# Patient Record
Sex: Female | Born: 1945 | Race: Black or African American | Hispanic: No | Marital: Married | Smoking: Current some day smoker
Health system: Southern US, Community
[De-identification: ages and names within clinical notes are randomized; demographics above are authoritative.]

## PROBLEM LIST (undated history)

## (undated) DIAGNOSIS — I5032 Chronic diastolic (congestive) heart failure: Secondary | ICD-10-CM

## (undated) DIAGNOSIS — Z8719 Personal history of other diseases of the digestive system: Secondary | ICD-10-CM

## (undated) DIAGNOSIS — I4891 Unspecified atrial fibrillation: Secondary | ICD-10-CM

## (undated) DIAGNOSIS — Z862 Personal history of diseases of the blood and blood-forming organs and certain disorders involving the immune mechanism: Secondary | ICD-10-CM

## (undated) DIAGNOSIS — K219 Gastro-esophageal reflux disease without esophagitis: Secondary | ICD-10-CM

## (undated) DIAGNOSIS — N183 Chronic kidney disease, stage 3 (moderate): Secondary | ICD-10-CM

## (undated) DIAGNOSIS — J45909 Unspecified asthma, uncomplicated: Secondary | ICD-10-CM

## (undated) DIAGNOSIS — J189 Pneumonia, unspecified organism: Secondary | ICD-10-CM

## (undated) DIAGNOSIS — R9439 Abnormal result of other cardiovascular function study: Secondary | ICD-10-CM

## (undated) DIAGNOSIS — I2510 Atherosclerotic heart disease of native coronary artery without angina pectoris: Secondary | ICD-10-CM

## (undated) DIAGNOSIS — E785 Hyperlipidemia, unspecified: Secondary | ICD-10-CM

## (undated) DIAGNOSIS — D62 Acute posthemorrhagic anemia: Secondary | ICD-10-CM

## (undated) DIAGNOSIS — J9601 Acute respiratory failure with hypoxia: Secondary | ICD-10-CM

## (undated) DIAGNOSIS — Z8709 Personal history of other diseases of the respiratory system: Secondary | ICD-10-CM

## (undated) DIAGNOSIS — I10 Essential (primary) hypertension: Secondary | ICD-10-CM

## (undated) DIAGNOSIS — I712 Thoracic aortic aneurysm, without rupture: Secondary | ICD-10-CM

## (undated) DIAGNOSIS — M109 Gout, unspecified: Secondary | ICD-10-CM

## (undated) DIAGNOSIS — M2550 Pain in unspecified joint: Secondary | ICD-10-CM

## (undated) DIAGNOSIS — R42 Dizziness and giddiness: Secondary | ICD-10-CM

## (undated) DIAGNOSIS — I700 Atherosclerosis of aorta: Secondary | ICD-10-CM

## (undated) HISTORY — DX: Atherosclerosis of aorta: I70.0

## (undated) HISTORY — DX: Dizziness and giddiness: R42

## (undated) HISTORY — DX: Personal history of other diseases of the respiratory system: Z87.09

## (undated) HISTORY — DX: Thoracic aortic aneurysm, without rupture: I71.2

## (undated) HISTORY — DX: Pneumonia, unspecified organism: J18.9

## (undated) HISTORY — DX: Personal history of other diseases of the digestive system: Z87.19

## (undated) HISTORY — DX: Pain in unspecified joint: M25.50

## (undated) HISTORY — DX: Hyperlipidemia, unspecified: E78.5

## (undated) HISTORY — DX: Gastro-esophageal reflux disease without esophagitis: K21.9

## (undated) HISTORY — DX: Atherosclerotic heart disease of native coronary artery without angina pectoris: I25.10

## (undated) HISTORY — DX: Unspecified atrial fibrillation: I48.91

## (undated) HISTORY — DX: Acute respiratory failure with hypoxia: J96.01

## (undated) HISTORY — DX: Essential (primary) hypertension: I10

## (undated) HISTORY — DX: Chronic diastolic (congestive) heart failure: I50.32

## (undated) HISTORY — DX: Personal history of diseases of the blood and blood-forming organs and certain disorders involving the immune mechanism: Z86.2

---
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
## 1979-09-02 HISTORY — PX: ABDOMINAL HYSTERECTOMY: SHX81

## 1999-03-11 ENCOUNTER — Encounter: Admission: RE | Admit: 1999-03-11 | Discharge: 1999-03-11 | Payer: Self-pay | Admitting: Family Medicine

## 1999-12-17 ENCOUNTER — Emergency Department (HOSPITAL_COMMUNITY): Admission: EM | Admit: 1999-12-17 | Discharge: 1999-12-17 | Payer: Self-pay

## 2000-05-29 ENCOUNTER — Encounter: Admission: RE | Admit: 2000-05-29 | Discharge: 2000-05-29 | Payer: Self-pay | Admitting: Family Medicine

## 2000-07-27 ENCOUNTER — Emergency Department (HOSPITAL_COMMUNITY): Admission: EM | Admit: 2000-07-27 | Discharge: 2000-07-27 | Payer: Self-pay | Admitting: Emergency Medicine

## 2000-08-26 ENCOUNTER — Emergency Department (HOSPITAL_COMMUNITY): Admission: EM | Admit: 2000-08-26 | Discharge: 2000-08-26 | Payer: Self-pay | Admitting: Emergency Medicine

## 2001-05-09 ENCOUNTER — Inpatient Hospital Stay (HOSPITAL_COMMUNITY): Admission: EM | Admit: 2001-05-09 | Discharge: 2001-05-12 | Payer: Self-pay | Admitting: Gastroenterology

## 2001-12-13 ENCOUNTER — Emergency Department (HOSPITAL_COMMUNITY): Admission: EM | Admit: 2001-12-13 | Discharge: 2001-12-13 | Payer: Self-pay

## 2002-02-14 ENCOUNTER — Encounter: Payer: Self-pay | Admitting: Emergency Medicine

## 2002-02-14 ENCOUNTER — Emergency Department (HOSPITAL_COMMUNITY): Admission: EM | Admit: 2002-02-14 | Discharge: 2002-02-14 | Payer: Self-pay | Admitting: Emergency Medicine

## 2003-04-10 ENCOUNTER — Emergency Department (HOSPITAL_COMMUNITY): Admission: EM | Admit: 2003-04-10 | Discharge: 2003-04-10 | Payer: Self-pay

## 2005-01-10 ENCOUNTER — Emergency Department (HOSPITAL_COMMUNITY): Admission: EM | Admit: 2005-01-10 | Discharge: 2005-01-10 | Payer: Self-pay | Admitting: Emergency Medicine

## 2006-09-13 ENCOUNTER — Inpatient Hospital Stay (HOSPITAL_COMMUNITY): Admission: EM | Admit: 2006-09-13 | Discharge: 2006-09-16 | Payer: Self-pay | Admitting: Emergency Medicine

## 2006-09-15 ENCOUNTER — Encounter (INDEPENDENT_AMBULATORY_CARE_PROVIDER_SITE_OTHER): Payer: Self-pay | Admitting: Specialist

## 2006-09-15 ENCOUNTER — Encounter: Payer: Self-pay | Admitting: Family Medicine

## 2006-09-17 ENCOUNTER — Ambulatory Visit: Payer: Self-pay | Admitting: Internal Medicine

## 2007-10-03 ENCOUNTER — Inpatient Hospital Stay (HOSPITAL_COMMUNITY): Admission: EM | Admit: 2007-10-03 | Discharge: 2007-10-06 | Payer: Self-pay | Admitting: Emergency Medicine

## 2007-10-14 ENCOUNTER — Ambulatory Visit: Payer: Self-pay | Admitting: Family Medicine

## 2007-10-14 DIAGNOSIS — I10 Essential (primary) hypertension: Secondary | ICD-10-CM | POA: Insufficient documentation

## 2007-10-14 DIAGNOSIS — Z8719 Personal history of other diseases of the digestive system: Secondary | ICD-10-CM

## 2007-10-14 HISTORY — DX: Personal history of other diseases of the digestive system: Z87.19

## 2008-06-06 ENCOUNTER — Emergency Department (HOSPITAL_COMMUNITY): Admission: EM | Admit: 2008-06-06 | Discharge: 2008-06-06 | Payer: Self-pay | Admitting: Emergency Medicine

## 2008-11-23 ENCOUNTER — Ambulatory Visit: Payer: Self-pay | Admitting: Family Medicine

## 2008-12-07 ENCOUNTER — Ambulatory Visit (HOSPITAL_COMMUNITY): Admission: RE | Admit: 2008-12-07 | Discharge: 2008-12-07 | Payer: Self-pay | Admitting: Family Medicine

## 2008-12-13 ENCOUNTER — Ambulatory Visit: Payer: Self-pay | Admitting: Family Medicine

## 2008-12-13 ENCOUNTER — Encounter: Payer: Self-pay | Admitting: Family Medicine

## 2008-12-13 LAB — CONVERTED CEMR LAB
ALT: <8 units/L (ref 0–35)
AST: 11 units/L (ref 0–37)
Alkaline Phosphatase: 53 units/L (ref 39–117)
BUN: 16 mg/dL (ref 6–23)
CO2: 26 meq/L (ref 19–32)
Chlamydia, DNA Probe: NEGATIVE
Cholesterol: 182 mg/dL (ref 0–200)
GC Probe Amp, Genital: NEGATIVE
Glucose, Bld: 99 mg/dL (ref 70–99)
LDL Cholesterol: 110 mg/dL — ABNORMAL HIGH (ref 0–99)
Sodium: 142 meq/L (ref 135–145)
Total CHOL/HDL Ratio: 3.6

## 2008-12-15 ENCOUNTER — Encounter: Payer: Self-pay | Admitting: Family Medicine

## 2009-01-09 ENCOUNTER — Encounter (INDEPENDENT_AMBULATORY_CARE_PROVIDER_SITE_OTHER): Payer: Self-pay | Admitting: *Deleted

## 2009-04-17 ENCOUNTER — Telehealth: Payer: Self-pay | Admitting: Family Medicine

## 2009-04-24 ENCOUNTER — Encounter: Admission: RE | Admit: 2009-04-24 | Discharge: 2009-04-24 | Payer: Self-pay | Admitting: Family Medicine

## 2009-05-21 ENCOUNTER — Ambulatory Visit: Payer: Self-pay | Admitting: Family Medicine

## 2009-05-21 DIAGNOSIS — N183 Chronic kidney disease, stage 3 (moderate): Secondary | ICD-10-CM

## 2009-06-18 ENCOUNTER — Encounter: Payer: Self-pay | Admitting: Family Medicine

## 2009-06-18 ENCOUNTER — Ambulatory Visit: Payer: Self-pay | Admitting: Family Medicine

## 2009-06-21 ENCOUNTER — Encounter: Payer: Self-pay | Admitting: Family Medicine

## 2009-06-21 LAB — CONVERTED CEMR LAB
AST: 10 units/L (ref 0–37)
Albumin: 3.9 g/dL (ref 3.5–5.2)
BUN: 20 mg/dL (ref 6–23)
Basophils Relative: 0 % (ref 0–1)
CO2: 25 meq/L (ref 19–32)
Chloride: 104 meq/L (ref 96–112)
Creatinine, Ser: 1.4 mg/dL — ABNORMAL HIGH (ref 0.40–1.20)
Glucose, Bld: 94 mg/dL (ref 70–99)
Hemoglobin: 11.3 g/dL — ABNORMAL LOW (ref 12.0–15.0)
MCV: 81.8 fL (ref 78.0–100.0)
Monocytes Absolute: 0.7 10*3/uL (ref 0.1–1.0)
Sodium: 140 meq/L (ref 135–145)
Total Bilirubin: 0.3 mg/dL (ref 0.3–1.2)
Total Protein: 6.9 g/dL (ref 6.0–8.3)

## 2009-08-13 ENCOUNTER — Ambulatory Visit: Payer: Self-pay | Admitting: Family Medicine

## 2009-08-13 DIAGNOSIS — F411 Generalized anxiety disorder: Secondary | ICD-10-CM | POA: Insufficient documentation

## 2009-08-13 LAB — CONVERTED CEMR LAB: Albumin/Creatinine Ratio, Urine, POC: >300

## 2009-09-03 ENCOUNTER — Encounter (INDEPENDENT_AMBULATORY_CARE_PROVIDER_SITE_OTHER): Payer: Self-pay | Admitting: *Deleted

## 2009-09-03 DIAGNOSIS — F17200 Nicotine dependence, unspecified, uncomplicated: Secondary | ICD-10-CM

## 2009-10-03 ENCOUNTER — Telehealth: Payer: Self-pay | Admitting: Family Medicine

## 2009-10-05 ENCOUNTER — Other Ambulatory Visit: Payer: Self-pay | Admitting: Emergency Medicine

## 2009-10-05 ENCOUNTER — Telehealth: Payer: Self-pay | Admitting: Family Medicine

## 2009-10-06 ENCOUNTER — Ambulatory Visit: Payer: Self-pay | Admitting: Family Medicine

## 2009-10-06 ENCOUNTER — Encounter: Payer: Self-pay | Admitting: Family Medicine

## 2009-10-06 DIAGNOSIS — R195 Other fecal abnormalities: Secondary | ICD-10-CM

## 2009-10-17 ENCOUNTER — Encounter: Payer: Self-pay | Admitting: Family Medicine

## 2009-11-08 ENCOUNTER — Telehealth: Payer: Self-pay | Admitting: Family Medicine

## 2009-11-18 ENCOUNTER — Encounter: Payer: Self-pay | Admitting: *Deleted

## 2009-11-23 ENCOUNTER — Encounter: Payer: Self-pay | Admitting: Family Medicine

## 2009-11-23 ENCOUNTER — Ambulatory Visit: Payer: Self-pay | Admitting: Family Medicine

## 2009-11-23 DIAGNOSIS — K2970 Gastritis, unspecified, without bleeding: Secondary | ICD-10-CM | POA: Insufficient documentation

## 2009-11-23 DIAGNOSIS — D649 Anemia, unspecified: Secondary | ICD-10-CM | POA: Insufficient documentation

## 2009-11-23 DIAGNOSIS — K2990 Gastroduodenitis, unspecified, without bleeding: Secondary | ICD-10-CM

## 2009-11-23 LAB — CONVERTED CEMR LAB
BUN: 20 mg/dL (ref 6–23)
Hemoglobin: 11 g/dL — ABNORMAL LOW (ref 12.0–15.0)
MCHC: 33.4 g/dL (ref 30.0–36.0)
Platelets: 212 10*3/uL (ref 150–400)
Potassium: 4 meq/L (ref 3.5–5.3)
RBC: 4.19 M/uL (ref 3.87–5.11)
RDW: 14.2 % (ref 11.5–15.5)
WBC: 5.7 10*3/uL (ref 4.0–10.5)

## 2009-11-26 ENCOUNTER — Telehealth (INDEPENDENT_AMBULATORY_CARE_PROVIDER_SITE_OTHER): Payer: Self-pay | Admitting: *Deleted

## 2010-02-04 ENCOUNTER — Emergency Department (HOSPITAL_COMMUNITY): Admission: EM | Admit: 2010-02-04 | Discharge: 2010-02-04 | Payer: Self-pay | Admitting: Emergency Medicine

## 2010-02-27 ENCOUNTER — Telehealth: Payer: Self-pay | Admitting: Family Medicine

## 2010-03-21 ENCOUNTER — Ambulatory Visit (HOSPITAL_COMMUNITY): Admission: RE | Admit: 2010-03-21 | Discharge: 2010-03-21 | Payer: Self-pay | Admitting: Family Medicine

## 2010-05-15 ENCOUNTER — Ambulatory Visit: Payer: Self-pay | Admitting: Family Medicine

## 2010-05-15 DIAGNOSIS — M25519 Pain in unspecified shoulder: Secondary | ICD-10-CM

## 2010-05-21 ENCOUNTER — Encounter: Payer: Self-pay | Admitting: Family Medicine

## 2010-06-21 ENCOUNTER — Emergency Department (HOSPITAL_COMMUNITY)
Admission: EM | Admit: 2010-06-21 | Discharge: 2010-06-21 | Payer: Self-pay | Source: Home / Self Care | Admitting: Emergency Medicine

## 2010-07-29 ENCOUNTER — Telehealth: Payer: Self-pay | Admitting: Family Medicine

## 2010-08-08 ENCOUNTER — Observation Stay (HOSPITAL_COMMUNITY): Admission: EM | Admit: 2010-08-08 | Discharge: 2009-10-06 | Payer: Self-pay | Admitting: Family Medicine

## 2010-09-19 ENCOUNTER — Ambulatory Visit: Admit: 2010-09-19 | Payer: Self-pay

## 2010-09-22 ENCOUNTER — Encounter: Payer: Self-pay | Admitting: Family Medicine

## 2010-09-23 ENCOUNTER — Encounter: Payer: Self-pay | Admitting: Family Medicine

## 2010-10-01 NOTE — Progress Notes (Signed)
Summary: Rx Req  Phone Note Refill Request Call back at Home Phone [phone] Message from:  Patient  Refills Requested: Medication #1:  AMLODIPINE BESYLATE 5 MG TABS take one tablet daily (replaces 2.5 mg) PT USES [facility] ON [address]  Initial call taken by: Raymond Gurney,  October 03, 2009 2:57 PM  Follow-up for Phone Call        will forward to MD. Follow-up by: Marcell Barlow RN,  October 03, 2009 3:09 PM    Prescriptions: PAROXETINE HCL 20 MG TABS (PAROXETINE HCL) take 20 mg daily  #30 x 5   Entered and Authorized by:   Suzanna Obey MD   Signed by:   Suzanna Obey MD on 10/04/2009   Method used:   Electronically to        [facility] [phone]* (retail)       [place], [place]  [address]       Ph: [phone]       Fax: [phone]   RxIDBA:[id number] AMLODIPINE BESYLATE 5 MG TABS (AMLODIPINE BESYLATE) take one tablet daily (replaces 2.5 mg)  #31 x 5   Entered and Authorized by:   Suzanna Obey MD   Signed by:   Suzanna Obey MD on 10/04/2009   Method used:   Electronically to        [facility] [phone]* (retail)       [place], [place]  [address]       Ph: [phone]       Fax: [phone]   RxIDBL:[id number] LISINOPRIL 20 MG TABS (LISINOPRIL) take one tablet once a day  #31 x 5   Entered and Authorized by:   Suzanna Obey MD   Signed by:   Suzanna Obey MD on 10/04/2009   Method used:   Electronically to        [facility] [phone]* (retail)       [place], [place]  [address]       Ph: [phone]       Fax: [phone]   RxIDII:[id number] HYDROCHLOROTHIAZIDE 25 MG  TABS (HYDROCHLOROTHIAZIDE) Take 1 tab daily for your blood pressure  #31 x 5   Entered and Authorized by:   Suzanna Obey MD   Signed by:   Suzanna Obey MD on 10/04/2009   Method used:   Electronically to        [facility] [phone]* (retail)       [address]       [place],   [address]    Ph: [phone]       Fax: [phone]   RxIDVB:[id number]

## 2010-10-01 NOTE — Letter (Signed)
Summary: Colonoscopy Report; Diverticulosis  Colonoscopy Report   Imported By: Raymond Gurney 02/13/2010 12:04:52  _____________________________________________________________________  External Attachment:    Type:   Image     Comment:   External Document

## 2010-10-01 NOTE — Letter (Signed)
Summary: Generic Letter  [facility]  [address]   [place], [place] [address]   Phone: [phone]  Fax: [phone]    10/17/2009  [address] [place], [place]  [address]  Dear Ms. FRAYRE,   You missed your labwork visit on February 7th to follow your anemia.  You also missed your hospital follow up appointment on February 14th with Dr. Doreene Nest.  Please call us to reschedule your appointments and to let us know how you are doing.  Hope you have a wonderful weekend.   Sincerely,   Ria Bush  MD  Appended Document: Generic Letter mailed

## 2010-10-01 NOTE — Assessment & Plan Note (Signed)
Summary: hfu/tcb   Vital Signs:  Patient profile:   65 year old female Height:      64 inches Weight:      170.5 pounds BMI:     29.37 Temp:     98.6 degrees F oral Pulse rate:   79 / minute BP sitting:   139 / 78  (right arm) Cuff size:   regular  Vitals Entered By: Levert Feinstein LPN (March 25, 624THL 579FGE PM) CC: hfu Is Patient Diabetic? No Pain Assessment Patient in pain? yes     Location: right shoulder   Primary Care Provider:  Suzanna Obey MD  CC:  hfu.  History of Present Illness: 65 yo here for hospital f/u and htn, anxiety  hospital f/u: Was seen in ER on 10/06/2009 for lower GI bleed.  Patient came in feeling dizzy, was found to have Hgb of 9.7  in the setting of known gastritis found on colonoscopy in 2008, known diverticulosis, and recent high dose NSAID use.  Patient was hemoccult positive.  She never returned for outpatient hemoglobin check, but states she has been doing well with no bleeding, no SOB, waekness, dizziness, fatigue.  anxiety:  says she has been much improved since starting paroxetine.  Her husband here today says he notices less crying, less moodiness and oevrall ebtter functioning.  Would like to continue at current dose.  Acute on chronic RF:  noted cr of 2.2 on hospital admissionw ith 1.75 on discharge.    HYPERTENSION Meds: Taking and tolerating? taking hctz 25, amlodipine 5, lisinopril 20 Home BP's: not checking Chest Pain: chest pain Dyspnea: no   Habits & Providers  Alcohol-Tobacco-Diet     Tobacco Status: current     Cigarette Packs/Day: <0.25  Current Medications (verified): 1)  Hydrochlorothiazide 25 Mg  Tabs (Hydrochlorothiazide) .... Take 1 Tab Daily For Your Blood Pressure 2)  Lisinopril 20 Mg Tabs (Lisinopril) .... Take One Tablet Once A Day 3)  Amlodipine Besylate 5 Mg Tabs (Amlodipine Besylate) .... Take One Tablet Daily (Replaces 2.5 Mg) 4)  Paroxetine Hcl 20 Mg Tabs (Paroxetine Hcl) .... Take 20 Mg Daily 5)  Pantoprazole  Sodium 40 Mg Tbec (Pantoprazole Sodium) .... Take Once Daily PMH-FH-SH reviewed-no changes except otherwise noted  Review of Systems      See HPI General:  Denies fatigue, fever, and weakness. CV:  Denies chest pain or discomfort, fatigue, lightheadness, shortness of breath with exertion, and swelling of feet. Resp:  Denies cough. Heme:  Denies bleeding and pallor.  Physical Exam  General:  VS Reviewed. Well appearing, NAD.  Appears much happier than I have ever seen her.  Lungs:  Normal respiratory effort, chest expands symmetrically. Lungs are clear to auscultation, no crackles or wheezes. Heart:  Normal rate and regular rhythm. S1 and S2 normal without gallop, murmur, click, rub or other extra sounds. Extremities:  no edema Psych:  Oriented X3, memory intact for recent and remote, normally interactive, good eye contact, and not anxious appearing.  Smiles much more than I have ever seen.  Makes pleasant conversation, seems more relaxed.   Impression & Recommendations:  Problem # 1:  CHRONIC KIDNEY DISEASE STAGE III (MODERATE) (ICD-585.3) Reviewed precautions for d/c lisinopril if dehydrated and to avoid nsaids.  WIll recheck creatinine today.  Labs Reviewed: BUN: 20 (06/18/2009)   Cr: 1.40 (06/18/2009)    Hgb: 11.3 (06/18/2009)   Hct: 34.1 (06/18/2009)   Ca++: 9.2 (06/18/2009)    TP: 6.9 (06/18/2009)   Alb: 3.9 (06/18/2009)  Orders: Basic Met-[facility] [id number]) CBC-[facility] [id number]) [facility]- Est  Level 4 [id number])  Problem # 2:  GASTRITIS, CHRONIC (ICD-535.10) Was prescribed protonix at hospital but patient never picked it up due to financial limitations.  She says she can now get assistance through health department.  I will send prescription there.  Her updated medication list for this problem includes:    Pantoprazole Sodium 40 Mg Tbec (Pantoprazole sodium) .[name]... Take once daily  Orders: CBC-[facility] [id number]) [place]- Est  Level 4 [id number])  Problem # 3:  HYPERTENSION, BENIGN ESSENTIAL  (ICD-401.1)  Much improved control on this regimen.  Continue current meds.  WIll monitor Cr today.  Her updated medication list for this problem includes:    Hydrochlorothiazide 25 Mg Tabs (Hydrochlorothiazide) .[name]... Take 1 tab daily for your blood pressure    Lisinopril 20 Mg Tabs (Lisinopril) .[name]... Take one tablet once a day    Amlodipine Besylate 5 Mg Tabs (Amlodipine besylate) .[name]... Take one tablet daily (replaces 2.5 mg)  BP today: 139/78 Prior BP: 149/67 (10/06/2009)  Labs Reviewed: K+: 3.5 (06/18/2009) Creat: : 1.40 (06/18/2009)   Chol: 182 (12/13/2008)   HDL: 50 (12/13/2008)   LDL: 110 (12/13/2008)   TG: 112 (12/13/2008)  Orders: [facility]- Est  Level 4 [id number])  Problem # 4:  ANEMIA (ICD-285.9)  duruing hospitalization was thought to be acute due to gastritis in the setting of nsaid use Hgb 9.7.  Will recheck today.  Advised daily multivitamin.  Hgb: 11.3 (06/18/2009)   Hct: 34.1 (06/18/2009)   Platelets: 183 (06/18/2009) RBC: 4.17 (06/18/2009)   RDW: 13.4 (06/18/2009)   WBC: 7.2 (06/18/2009) MCV: 81.8 (06/18/2009)   MCHC: 33.1 (06/18/2009)  Orders: [facility]- Est  Level 4 [id number])  Complete Medication List: 1)  Hydrochlorothiazide 25 Mg Tabs (Hydrochlorothiazide) .... Take 1 tab daily for your blood pressure 2)  Lisinopril 20 Mg Tabs (Lisinopril) .... Take one tablet once a day 3)  Amlodipine Besylate 5 Mg Tabs (Amlodipine besylate) .... Take one tablet daily (replaces 2.5 mg) 4)  Paroxetine Hcl 20 Mg Tabs (Paroxetine hcl) .... Take 20 mg daily 5)  Pantoprazole Sodium 40 Mg Tbec (Pantoprazole sodium) .... Take once daily  Patient Instructions: 1)  Avoid anything that said NSAID like aspirin, goody's powder, ibuprofen, aleve, motrin, advil. 2)  If you ever get dehydrated or have lots of throwing up or diarrhea, stop your lisinopril until you feel better. 3)  Work on getting daily exercise and nutrition as in handout. 4)  Check your BP daily- your goal is less than 130/80.  Your  blood pressure looks so much better! 5)  Follow-up in 3 months. Prescriptions: PANTOPRAZOLE SODIUM 40 MG TBEC (PANTOPRAZOLE SODIUM) take once daily  #30 x 5   Entered and Authorized by:   Suzanna Obey MD   Signed by:   Suzanna Obey MD on 11/23/2009   Method used:   Faxed to ...       [facility] (retail)       [address] [place], [place]  [address]       Ph: [phone]       Fax: [phone]   RxID:   [id number]    Prevention & Chronic Care Immunizations   Influenza vaccine: Not documented   Influenza vaccine deferral: Refused  (08/13/2009)   Influenza vaccine due: Not Indicated    Tetanus booster: Not documented    Pneumococcal vaccine: Not documented   Pneumococcal vaccine due: 05/21/2014  H. zoster vaccine: Not documented  Colorectal Screening   Hemoccult: Not documented    Colonoscopy: Not documented  Other Screening   Pap smear: NEGATIVE FOR INTRAEPITHELIAL LESIONS OR MALIGNANCY.  (12/13/2008)   Pap smear due: 12/13/2009    Mammogram: BI-RADS CATEGORY 2:  Benign finding(s).^MM DIGITAL DIAG LTD R  (04/24/2009)   Mammogram due: 12/07/2009    DXA bone density scan: Not documented   Smoking status: current  (11/23/2009)  Lipids   Total Cholesterol: 182  (12/13/2008)   LDL: 110  (12/13/2008)   LDL Direct: Not documented   HDL: 50  (12/13/2008)   Triglycerides: 112  (12/13/2008)  Hypertension   Last Blood Pressure: 139 / 78  (11/23/2009)   Serum creatinine: 1.40  (06/18/2009)   Serum potassium 3.5  (06/18/2009)    Hypertension flowsheet reviewed?: Yes   Progress toward BP goal: Improved  Self-Management Support :   Personal Goals (by the next clinic visit) :      Personal blood pressure goal: 130/80  (08/13/2009)   Patient will work on the following items until the next clinic visit to reach self-care goals:     Medications and monitoring: take my medicines every day, check my blood pressure, bring all of my  medications to every visit  (11/23/2009)     Eating: drink diet soda or water instead of juice or soda, eat more vegetables, use fresh or frozen vegetables, eat foods that are low in salt, eat baked foods instead of fried foods, limit or avoid alcohol  (11/23/2009)     Activity: take a 30 minute walk every day  (11/23/2009)    Hypertension self-management support: Education handout, Engineer, technical sales, Written self-care plan  (11/23/2009)   Hypertension self-care plan printed.   Hypertension education handout printed  Appended Document: hfu/tcb If patient is not available by phone, may try her husband's cell at [phone].

## 2010-10-01 NOTE — Progress Notes (Signed)
Summary: refill  Phone Note Refill Request Call back at [phone] Message from:  Patient  Refills Requested: Medication #1:  LISINOPRIL 20 MG TABS take one tablet once a day  Medication #2:  AMLODIPINE BESYLATE 5 MG TABS take one tablet daily (replaces 2.5 mg)  Medication #3:  HYDROCHLOROTHIAZIDE 25 MG  TABS Take 1 tab daily for your blood pressure [facility]  Initial call taken by: Audie Clear,  July 29, 2010 9:56 AM    Prescriptions: AMLODIPINE BESYLATE 5 MG TABS (AMLODIPINE BESYLATE) take one tablet daily (replaces 2.5 mg)  #30 x 2   Entered and Authorized by:   Suzanna Obey MD   Signed by:   Suzanna Obey MD on 07/29/2010   Method used:   Faxed to ...       [facility] (retail)       [place], [place]  [address]       Ph: [phone]       Fax: [phone]   RxID:   [id number] HYDROCHLOROTHIAZIDE 25 MG  TABS (HYDROCHLOROTHIAZIDE) Take 1 tab daily for your blood pressure  #31 x 2   Entered and Authorized by:   Suzanna Obey MD   Signed by:   Suzanna Obey MD on 07/29/2010   Method used:   Faxed to ...       [facility] (retail)       [place], [place]  [address]       Ph: [phone]       Fax: [phone]   RxID:   [id number] LISINOPRIL 20 MG TABS (LISINOPRIL) take one tablet once a day  #30 x 2   Entered and Authorized by:   Suzanna Obey MD   Signed by:   Suzanna Obey MD on 07/29/2010   Method used:   Faxed to ...       [facility] (retail)       [address] [place], [place]  [address]       Ph: [phone]       Fax: [phone]   RxID:   [id number]

## 2010-10-01 NOTE — Letter (Signed)
Summary: Probation Letter  [facility]  [address]   [place], [place] [address]   Phone: [phone]  Fax: [phone]    11/18/2009  KEARIE PROPER [place], [place]  [address]  Dear Ms. SEIM,  With the goal of better serving all our patients the [facility] is following each patient's missed appointments.  You have missed at least 3 appointments with our practice.If you cannot keep your appointment, we expect you to call at least 24 hours before your appointment time.  Missing appointments prevents other patients from seeing [place] and makes it difficult to provide you with the best possible medical care.      1.   If you miss one more appointment, we will only give you limited medical services. This means we will not call in medication refills, complete a form, or make a referral for you except when you are here for a scheduled office visit.    2.   If you miss 2 or more appointments in the next year, we will dismiss you from our practice.    Our office staff can be reached at [phone] Monday through Friday from 8:30 a.m.-5:00 p.m. and will be glad to schedule your appointment as necessary.    Thank you.   [facility]  Appended Document: Probation Letter cert. mailed

## 2010-10-01 NOTE — Progress Notes (Signed)
Summary: refill  Phone Note Refill Request Call back at Home Phone [phone] Message from:  Patient  Refills Requested: Medication #1:  HYDROCHLOROTHIAZIDE 25 MG  TABS Take 1 tab daily for your blood pressure [facility]  Initial call taken by: Audie Clear,  February 27, 2010 10:45 AM    Prescriptions: HYDROCHLOROTHIAZIDE 25 MG  TABS (HYDROCHLOROTHIAZIDE) Take 1 tab daily for your blood pressure  #31 x 1   Entered and Authorized by:   Suzanna Obey MD   Signed by:   Suzanna Obey MD on 02/27/2010   Method used:   Electronically to        [facility] (retail)       [address]       [place], [place]  [address]       Ph: [phone]       Fax: [phone]   RxID:   [id number]

## 2010-10-01 NOTE — Miscellaneous (Signed)
Summary: Tobacco Lavada Langsam  Clinical Lists Changes  Problems: Added new problem of TOBACCO Emelyn Roen (ICD-305.1) 

## 2010-10-01 NOTE — Miscellaneous (Signed)
Summary: rx  Patient says that she cannot afford to get Pantoprazole.  She said it is $80.00 and wants to know if you can prescribe something else and send it to [facility]. Beryle Lathe  May 21, 2010 12:12 PM  I have prescribed omeprazole. Suzanna Obey MD  May 21, 2010 1:38 PM

## 2010-10-01 NOTE — Initial Assessments (Signed)
Summary: Hospital Admission H&P   Primary lesion:  PCP:  History of Present Illness: 65yo F transferred from [facility] w/ blood per rectum  mixed with stool x 5 days.  She states that she took two 800mg  ibuprofen on Tues for a headache and noticed blood in her stool that night.  Prior to that, her stool regimen was regular denying any straining, diarrhea, abd pain, or hard stools.  Denies taking any schedule NSAIDs.  She states that the blood in the stools are intermittent.  The last episode was yesterday prior to going to the ER.  She was going to be discharged from the ER until she c/o dizziness.  She denies any syncopal episodes, blurry vision, chest pain, SOB, fevers, or abd pain.  Denies any hemoptysis.  She had a colonoscopy in 2008 that was significant for diverticular dz.  She had an upper endoscopy in 2009 significant for mild antral gastritis c/w NSAID exposure.     Review of Systems       She denies any syncopal episodes, blurry vision, chest pain, SOB, fevers, or abd pain.  Denies any hemoptysis.    Past History:  Family History: Last updated: 10/06/2009 DM  Social History: Last updated: 10/06/2009 Lives with husband, Gerald Socorro, and son.   Unemployed Smoke 1-3 cigarettes/day Occasional EtOH use Hx of marjuana and crack use  Past Medical History: Diverticular Dz Hx of Anemia HTN Depression Asthma  Past Surgical History: hyterectomy- 89    Family History: DM  Social History: Lives with husband, Aeisha Reutov, and son.   Unemployed Smoke 1-3 cigarettes/day Occasional EtOH use Hx of marjuana and crack use   Impression & Recommendations:  Problem # 1:  BLOOD IN STOOL, OCCULT (ICD-792.1) Assessment Deteriorated x 5 days.  Intermittent blood stained stool with occasional blood clots.  No melena per history.  She has a hx of diverticular bleeds and dz seen on colonoscopy 2008.  Last Hgb was 11.3 (06/2009).  This is most likely a lower GI bleed.  She  has no abd pain, N/V, or cramps and not acutely bleeding now.  She is hemodynamically stable and asymptomatic.  Plan to observe over night.  Will reassess Hgb in the morning.    Problem # 2:  CHRONIC KIDNEY DISEASE STAGE III (MODERATE) (ICD-585.3) Assessment: Deteriorated Worsened renal function compared to 06/2009 when her Cr was 1.4  She appears to be euvolemic.  No new medications except the 2 ibuprofens she took on Tues.  BP stable.  Will check FENa for better assessment of renal perfusion.  Will continue with the ACE-I for now.  No prior hx of DM.  UA was neg.  No signs of obstruction.  Will follow daily BMETs.  Problem # 3:  ? of DEPRESSION (ICD-311) Assessment: Unchanged Continue home paroxetine.  Her updated medication list for this problem includes:    Paroxetine Hcl 20 Mg Tabs (Paroxetine hcl) .[name]... Take 20 mg daily  Problem # 4:  HYPERTENSION, BENIGN ESSENTIAL (ICD-401.1) Assessment: Unchanged Cont home meds  Her updated medication list for this problem includes:    Hydrochlorothiazide 25 Mg Tabs (Hydrochlorothiazide) .[name]... Take 1 tab daily for your blood pressure    Lisinopril 20 Mg Tabs (Lisinopril) .[name]... Take one tablet once a day    Amlodipine Besylate 5 Mg Tabs (Amlodipine besylate) .[name]... Take one tablet daily (replaces 2.5 mg)  Problem # 5:  FEN Assessment: Comment Only KVO fluids for now while pt is eating and drinking and assess  FENa before reinitiating fluids.  Problem # 6:  Proph Assessment: Comment Only No anticoagulation b/c of present GI bleed.  SCDs in place.  Protonix 40mg  once daily.   Problem # 7:  Dispo Assessment: Comment Only Admit to FPTS.  Obs Status.  Reg bed.  Complete Medication List: 1)  Hydrochlorothiazide 25 Mg Tabs (Hydrochlorothiazide) .... Take 1 tab daily for your blood pressure 2)  Lisinopril 20 Mg Tabs (Lisinopril) .... Take one tablet once a day 3)  Amlodipine Besylate 5 Mg Tabs (Amlodipine besylate) .... Take one tablet daily  (replaces 2.5 mg) 4)  Paroxetine Hcl 20 Mg Tabs (Paroxetine hcl) .... Take 20 mg daily   Vital Signs:  Patient profile:   65 year old female O2 Sat:      91 % on Room air Temp:     99.2 degrees F Pulse rate:   70 / minute Resp:     20 per minute BP supine:   149 / 67  O2 Flow:  Room air   Physical Exam  General:  VS Reviewed. Well appearing, NAD.  Head:  normocephalic and atraumatic.   Eyes:  vision grossly intact, pupils equal, pupils round, pupils reactive to light, pupils react to accomodation, and no injection.   Mouth:  poor dentition; moist mucus membranes Neck:  supple, full ROM, no goiter or mass  Lungs:  Normal respiratory effort, chest expands symmetrically. Lungs are clear to auscultation, no crackles or wheezes. Heart:  Normal rate and regular rhythm. S1 and S2 normal without gallop, murmur, click, rub or other extra sounds. Abdomen:  Soft, NT, ND, no HSM, active BS  Pulses:  2+ Extremities:  no edema Neurologic:  no focal deficits Skin:  nl color and turgor Cervical Nodes:  no LAD Additional Exam:  Hemaoccult positive Hgb 9.7, MCV 84.8, WBC 6.5, Plt 178 Na 140, K 3.5, Cl 105, CO2 27, BUN 27, Cr 2.18, Gluc 98 AST 17, ALT  10, T bili 0.7, Alk Phos 41, TP 6.7, Alb 3.4, Ca 8.9 UA neg  Prior Medications: HYDROCHLOROTHIAZIDE 25 MG  TABS (HYDROCHLOROTHIAZIDE) Take 1 tab daily for your blood pressure LISINOPRIL 20 MG TABS (LISINOPRIL) take one tablet once a day AMLODIPINE BESYLATE 5 MG TABS (AMLODIPINE BESYLATE) take one tablet daily (replaces 2.5 mg) PAROXETINE HCL 20 MG TABS (PAROXETINE HCL) take 20 mg daily   Appended Document: Orders Update Future orders for lab visit 10/08/2008 am - check Hgb and Cr. Ria Bush  MD  October 06, 2009 2:44 PM    Clinical Lists Changes  Orders: Added new Test order of Basic Met-[facility] [phone]) - Signed Added new Test order of CBC[facility] [id number]) - Signed

## 2010-10-01 NOTE — Progress Notes (Signed)
Summary: Gross blood in stools - hemodynamically stable  Phone Note Other Incoming   Caller: [facility] ED physician Laurance Flatten Summary of Call: States that patient is in the ER c/o blood tinged stools x 1 week.  Hemodynamically stable.  Hgb 9.7.  Asked me what we should do.  I advised to f/u in clinic on Monday for further evaluation.  Provide parameters and precautions to go to the ER or urgent care if she has syncopal episodes, SOB, or worsening gross blood in the stool.  Will plan to recheck a hgb on Monday.  [facility]. Initial call taken by: Dion Body  MD,  October 05, 2009 7:30 PM

## 2010-10-01 NOTE — Progress Notes (Signed)
Summary: refill  Phone Note Refill Request Call back at Home Phone [phone] Message from:  Patient  Refills Requested: Medication #1:  LISINOPRIL 20 MG TABS take one tablet once a day  Medication #2:  AMLODIPINE BESYLATE 5 MG TABS take one tablet daily (replaces 2.5 mg) [facility]   Next Appointment Scheduled: 11/15/09 Initial call taken by: Audie Clear,  November 08, 2009 3:15 PM    New/Updated Medications: LISINOPRIL 20 MG TABS (LISINOPRIL) take one tablet once a day Prescriptions: LISINOPRIL 20 MG TABS (LISINOPRIL) take one tablet once a day  #30 x 1   Entered and Authorized by:   Suzanna Obey MD   Signed by:   Suzanna Obey MD on 11/08/2009   Method used:   Faxed to ...       [facility] (retail)       [place], [place]  [address]       Ph: [phone]       Fax: [phone]   RxID:   [id number] AMLODIPINE BESYLATE 5 MG TABS (AMLODIPINE BESYLATE) take one tablet daily (replaces 2.5 mg)  #30 x 1   Entered and Authorized by:   Suzanna Obey MD   Signed by:   Suzanna Obey MD on 11/08/2009   Method used:   Faxed to ...       [facility] (retail)       [address] [place], [place]  [address]       Ph: [phone]       Fax: [phone]   RxID:   [id number]

## 2010-10-01 NOTE — Assessment & Plan Note (Signed)
Summary: f/u,df   Vital Signs:  Patient profile:   65 year old female Height:      64 inches Weight:      176 pounds BMI:     30.32 BSA:     1.85 Temp:     98.6 degrees F Pulse rate:   75 / minute BP sitting:   180 / 95  Vitals Entered By: Christen Bame CMA (May 15, 2010 3:36 PM) CC: f/u Is Patient Diabetic? No Pain Assessment Patient in pain? yes     Location: rigth arm Intensity: 8   Primary Care Provider:  Suzanna Obey MD  CC:  f/u.  History of Present Illness: 65 yo seen for follow-up of chronic medical conditions.  She also wants to discuss shoulder pain   HYPERTENSION Meds: Taking and tolerating? has not been taking amlodipine.  No particular reason Home BP's: no Chest Pain: no Dyspnea: no  Depression:  Stopped taking SSRI.  She was not having any side effects, costs was not an issue.  She notes "side effect" as when she stopped taking it abruptly, she got anxious and jittery.  She says she was doing much better before she stopped and her husband and son feel her mood worsened when she was off.  She agrees.  Left shoulder pain: sore, no trauma, still able to do overhead motions.  Has been avoiding NSAIDS as discussed.  Taking tylenol without releif.  Habits & Providers  Alcohol-Tobacco-Diet     Alcohol drinks/day: 1     Alcohol Counseling: not indicated; patient does not drink     Alcohol type: beer     Tobacco Status: current     Tobacco Counseling: to quit use of tobacco products     Cigarette Packs/Day: <0.25     Year Started: 1960s  Current Medications (verified): 1)  Hydrochlorothiazide 25 Mg  Tabs (Hydrochlorothiazide) .... Take 1 Tab Daily For Your Blood Pressure 2)  Lisinopril 20 Mg Tabs (Lisinopril) .... Take One Tablet Once A Day 3)  Amlodipine Besylate 5 Mg Tabs (Amlodipine Besylate) .... Take One Tablet Daily (Replaces 2.5 Mg) 4)  Paroxetine Hcl 20 Mg Tabs (Paroxetine Hcl) .... Take 20 Mg Daily 5)  Pantoprazole Sodium 40 Mg Tbec  (Pantoprazole Sodium) .... Take Once Daily 6)  Ketoprofen Gel 20% .... Compounded- Apply To Affected Area 3-4 Times A Day.  Dispense 60 Grams PMH-FH-SH reviewed for relevance  Review of Systems      See HPI  Physical Exam  General:  VS Reviewed. Well appearing, NAD.  Lungs:  Normal respiratory effort, chest expands symmetrically. Lungs are clear to auscultation, no crackles or wheezes. Heart:  Normal rate and regular rhythm. S1 and S2 normal without gallop, murmur, click, rub or other extra sounds. Msk:  left shoulde rwith normal strength bilaterally, neurovascularly intact.  No impingement, full range of motion with minimal pain.  No tenderness to palpation of acromion or bicipital groove Extremities:  no edema   Impression & Recommendations:  Problem # 1:  HYPERTENSION, BENIGN ESSENTIAL (ICD-401.1)  poorly controlled.  noncompliant with meds.  reviewed med rec, importance of blood pressure control on kidney health.  Will refill meds.  Will try to get labwork at next visit.  Patient is very fearful and I try to limit blood draws to as few as possible while we build a therapeutic alliance.  Will have her follow-p in 1 month.  Her updated medication list for this problem includes:    Hydrochlorothiazide 25 Mg Tabs (  Hydrochlorothiazide) .[name]... Take 1 tab daily for your blood pressure    Lisinopril 20 Mg Tabs (Lisinopril) .[name]... Take one tablet once a day    Amlodipine Besylate 5 Mg Tabs (Amlodipine besylate) .[name]... Take one tablet daily (replaces 2.5 mg)  Orders: [place]- Est  Level 4 [id number])  Problem # 2:  CHRONIC KIDNEY DISEASE STAGE III (MODERATE) (ICD-585.3)  Patient is tearful if I say the word "kidney" and becomes frightened about dialysis.  Patient has multiple family members on dialysis and appears to be in denial about her own renal insufficiency.  I have not been able to help patient link uncontrolled hypertension ond CKD  and given her strong reaction, also cannot refer her to  nephrology.  Will continue to addres at future visits.  Orders: [place]- Est  Level 4 [id number])  Problem # 3:  GENERALIZED ANXIETY DISORDER (ICD-300.02)  will resume paroxetine.   patient counseled on not abruptly discontinuing meds.  Her updated medication list for this problem includes:    Paroxetine Hcl 20 Mg Tabs (Paroxetine hcl) .[name]... Take 20 mg daily  Orders: [place]- Est  Level 4 [id number])  Problem # 4:  SHOULDER PAIN (ICD-719.41)  likely due to arthritis.  Will try topical diclofenac since patient cannot take oral nsaids due to renal disease and is not getting good releif from tylenol alone.  Given instructions on shoulder exercises to prevent frozen shoulder.  Orders: [place]- Est  Level 4 [id number])  Complete Medication List: 1)  Hydrochlorothiazide 25 Mg Tabs (Hydrochlorothiazide) .... Take 1 tab daily for your blood pressure 2)  Lisinopril 20 Mg Tabs (Lisinopril) .... Take one tablet once a day 3)  Amlodipine Besylate 5 Mg Tabs (Amlodipine besylate) .... Take one tablet daily (replaces 2.5 mg) 4)  Paroxetine Hcl 20 Mg Tabs (Paroxetine hcl) .... Take 20 mg daily 5)  Pantoprazole Sodium 40 Mg Tbec (Pantoprazole sodium) .... Take once daily 6)  Ketoprofen Gel 20%  .... Compounded- apply to affected area 3-4 times a day.  dispense 60 grams  Patient Instructions: 1)  I will refill all of your medicines 2)  New medicine-ketoprofen gel for shoulder pain 3)  See exercises to keep shoulder moving 4)  It is important to not abruptly stop taking your paroxetine 5)  Your blood pressure is very high. 6)  If you notice  7)  Follow-up in 1 month. Prescriptions: PAROXETINE HCL 20 MG TABS (PAROXETINE HCL) take 20 mg daily  #30 x 5   Entered and Authorized by:   Suzanna Obey MD   Signed by:   Suzanna Obey MD on 05/20/2010   Method used:   Electronically to        [facility] (retail)       [place], [place]  [address]       Ph: [phone]        Fax: [phone]   RxID:   [id number] PANTOPRAZOLE SODIUM 40 MG TBEC (PANTOPRAZOLE SODIUM) take once daily  #30 x 5   Entered and Authorized by:   Suzanna Obey MD   Signed by:   Suzanna Obey MD on 05/20/2010   Method used:   Electronically to        [facility] (retail)       [address]       [place], [place]  [address]  Ph: [phone]       Fax: [phone]   [id number] PANTOPRAZOLE SODIUM 40 MG TBEC (PANTOPRAZOLE SODIUM) take once daily  #30 x 5   Entered and Authorized by:   Suzanna Obey MD   Signed by:   Suzanna Obey MD on 05/15/2010   Method used:   Electronically to        [facility] (retail)       [place], [place]  [address]       Ph: [phone]       Fax: [phone]   RxID:   [id number] PAROXETINE HCL 20 MG TABS (PAROXETINE HCL) take 20 mg daily  #30 x 5   Entered and Authorized by:   Suzanna Obey MD   Signed by:   Suzanna Obey MD on 05/15/2010   Method used:   Electronically to        [facility] (retail)       [place], [place]  [address]       Ph: [phone]       Fax: [phone]   RxID:   [id number] AMLODIPINE BESYLATE 5 MG TABS (AMLODIPINE BESYLATE) take one tablet daily (replaces 2.5 mg)  #30 x 2   Entered and Authorized by:   Suzanna Obey MD   Signed by:   Suzanna Obey MD on 05/15/2010   Method used:   Electronically to        [facility] (retail)       [place], [place]  [address]       Ph: [phone]       Fax: [phone]   [id number] LISINOPRIL 20 MG TABS (LISINOPRIL) take one tablet once a day  #30 x 2   Entered and Authorized by:   Suzanna Obey MD   Signed by:   Suzanna Obey MD on 05/15/2010   Method used:   Electronically to        [facility] (retail)       [address]       [place], [place]  [address]       Ph: [phone]       Fax: [phone]   RxID:   [id number] HYDROCHLOROTHIAZIDE 25 MG  TABS (HYDROCHLOROTHIAZIDE) Take 1 tab daily for your blood pressure  #31 x 2   Entered and Authorized by:   Suzanna Obey MD   Signed by:   Suzanna Obey MD on 05/15/2010   Method used:   Electronically to        [facility] (retail)       [address]       [place], [place]  [address]       Ph: [phone]       Fax: [phone]   RxID:   [id number]    Prevention & Chronic Care Immunizations   Influenza vaccine: Not documented   Influenza vaccine deferral: Refused  (08/13/2009)   Influenza vaccine due: Not Indicated    Tetanus booster: Not documented    Pneumococcal vaccine: Not documented  Pneumococcal vaccine due: 05/21/2014    H. zoster vaccine: Not documented  Colorectal Screening   Hemoccult: Not documented    Colonoscopy: Not documented  Other Screening   Pap smear: NEGATIVE FOR INTRAEPITHELIAL LESIONS OR MALIGNANCY.  (12/13/2008)   Pap smear due: 12/13/2009    Mammogram: ASSESSMENT: Negative - BI-RADS 1^MM DIGITAL SCREENING  (03/21/2010)   Mammogram due: 12/07/2009    DXA bone density scan: Not documented   Smoking status: current  (05/15/2010)  Lipids   Total Cholesterol: 182  (12/13/2008)   LDL: 110  (12/13/2008)   LDL Direct: Not documented   HDL: 50  (12/13/2008)   Triglycerides: 112  (12/13/2008)  Hypertension   Last Blood Pressure: 180 / 95  (05/15/2010)   Serum creatinine: 1.69  (11/23/2009)   Serum potassium 4.0  (11/23/2009)    Hypertension flowsheet reviewed?: Yes   Progress toward BP goal: Deteriorated  Self-Management Support :   Personal Goals (by the next clinic visit) :      Personal blood pressure goal: 130/80  (08/13/2009)   Hypertension self-management support: Education handout, Engineer, technical sales, Written self-care plan  (11/23/2009)

## 2010-10-01 NOTE — Progress Notes (Signed)
  Phone Note Other Incoming   Request: Send information Action Taken: Camera operator of Call: Tonya at [facility] called for colonoscopy reports from 2008.  Faxed colon,EGD from 09/15/2006.

## 2010-10-09 ENCOUNTER — Ambulatory Visit (INDEPENDENT_AMBULATORY_CARE_PROVIDER_SITE_OTHER): Payer: Self-pay | Admitting: Family Medicine

## 2010-10-09 ENCOUNTER — Encounter: Payer: Self-pay | Admitting: Family Medicine

## 2010-10-09 DIAGNOSIS — K2990 Gastroduodenitis, unspecified, without bleeding: Secondary | ICD-10-CM

## 2010-10-09 DIAGNOSIS — K2970 Gastritis, unspecified, without bleeding: Secondary | ICD-10-CM

## 2010-10-09 DIAGNOSIS — F411 Generalized anxiety disorder: Secondary | ICD-10-CM

## 2010-10-09 DIAGNOSIS — I10 Essential (primary) hypertension: Secondary | ICD-10-CM

## 2010-10-09 MED ORDER — PAROXETINE HCL 20 MG PO TABS: 20.0000 mg | ORAL_TABLET | Freq: Every day | ORAL | Status: DC

## 2010-10-09 MED ORDER — OMEPRAZOLE 20 MG PO CPDR: 20.0000 mg | DELAYED_RELEASE_CAPSULE | Freq: Every day | ORAL | Status: DC

## 2010-10-09 MED ORDER — AMLODIPINE BESYLATE 5 MG PO TABS: 5.0000 mg | ORAL_TABLET | Freq: Every day | ORAL | Status: DC

## 2010-10-09 NOTE — Assessment & Plan Note (Addendum)
History of poor control improved today but still above goal 130/80.  Patient not currently taking amlodipine.  Asked her to restart, I have refilled today.  It will be important to check labs on her but she declines again today.  She has committed to checking bloodwork at next visit  In one month

## 2010-10-09 NOTE — Assessment & Plan Note (Signed)
Poorly controlled as patient has increase in psychosocial stressors and has discontinued paroxetine.  She had reported good response to this in the past.  Complains of increased nervousness when out of medications.  Discussed this can be results of abruptly stopping medications and encouraged regular use of medications.  Will refill today and follow-up for response to treatment.

## 2010-10-09 NOTE — Patient Instructions (Signed)
Restart paroxetine for your nerves.  It is important to take this every day to prevent feeling nervous or jittery. Follow-up in 4 weeks and we will get fasting bloodwork at that time

## 2010-10-09 NOTE — Progress Notes (Signed)
  Subjective:    Patient ID: Jennifer Foster, female    DOB: 29-Apr-1946, 65 y.o.   MRN: [id number]  Hypertension This is a chronic problem. The current episode started more than 1 year ago. The problem has been gradually improving since onset. The problem is uncontrolled. Associated symptoms include anxiety. Pertinent negatives include no chest pain, peripheral edema or shortness of breath. Risk factors for coronary artery disease include stress and post-menopausal state. Past treatments include diuretics, ACE inhibitors and calcium channel blockers. The current treatment provides mild improvement. Compliance problems include psychosocial issues.  Hypertensive end-organ damage includes kidney disease. There is no history of angina, CVA or PVD.  Anxiety Symptoms include depressed mood, excessive worry, insomnia, irritability and nervous/anxious behavior. Patient reports no chest pain, shortness of breath or suicidal ideas. Symptoms occur constantly. The severity of symptoms is severe and causing significant distress. The quality of sleep is poor.   Compliance with medications is 0-25%. Treatment side effects: nervousness.      Review of Systems  Constitutional: Positive for irritability. Negative for fever and chills.  HENT: Positive for sore throat.   Respiratory: Negative for shortness of breath.   Cardiovascular: Negative for chest pain.  Psychiatric/Behavioral: Negative for suicidal ideas. The patient is nervous/anxious and has insomnia.        Objective:   Physical Exam  Constitutional: She appears well-developed. She appears distressed.  Cardiovascular: Normal rate, regular rhythm and normal heart sounds.   Pulmonary/Chest: Effort normal and breath sounds normal. She has no wheezes.  Musculoskeletal: She exhibits no edema.  Psychiatric: Her mood appears anxious. She exhibits a depressed mood.       Tearful, poor eye contact          Assessment & Plan:

## 2010-11-18 LAB — URINALYSIS, ROUTINE W REFLEX MICROSCOPIC
Hgb urine dipstick: NEGATIVE
Ketones, ur: NEGATIVE mg/dL
Protein, ur: 100 mg/dL — AB
Specific Gravity, Urine: 1.015 (ref 1.005–1.030)

## 2010-11-18 LAB — URINE MICROSCOPIC-ADD ON

## 2010-11-20 LAB — COMPREHENSIVE METABOLIC PANEL
AST: 17 U/L (ref 0–37)
Albumin: 3.4 g/dL — ABNORMAL LOW (ref 3.5–5.2)
Alkaline Phosphatase: 41 U/L (ref 39–117)
BUN: 27 mg/dL — ABNORMAL HIGH (ref 6–23)
CO2: 27 mEq/L (ref 19–32)
Chloride: 105 mEq/L (ref 96–112)
Creatinine, Ser: 2.18 mg/dL — ABNORMAL HIGH (ref 0.4–1.2)
GFR calc Af Amer: 28 mL/min — ABNORMAL LOW (ref >60)
Glucose, Bld: 98 mg/dL (ref 70–99)
Sodium: 140 mEq/L (ref 135–145)
Total Bilirubin: 0.7 mg/dL (ref 0.3–1.2)
Total Protein: 6.7 g/dL (ref 6.0–8.3)

## 2010-11-20 LAB — CBC
HCT: 28.4 % — ABNORMAL LOW (ref 36.0–46.0)
MCV: 86.5 fL (ref 78.0–100.0)
Platelets: 178 10*3/uL (ref 150–400)
Platelets: 161 10*3/uL (ref 150–400)
RBC: 3.18 MIL/uL — ABNORMAL LOW (ref 3.87–5.11)

## 2010-11-20 LAB — CREATININE, URINE, RANDOM: Creatinine, Urine: 79 mg/dL

## 2010-11-20 LAB — DIFFERENTIAL
Basophils Absolute: 0 10*3/uL (ref 0.0–0.1)
Eosinophils Absolute: 0.1 10*3/uL (ref 0.0–0.7)
Eosinophils Relative: 1 % (ref 0–5)
Monocytes Absolute: 0.8 10*3/uL (ref 0.1–1.0)
Monocytes Relative: 13 % — ABNORMAL HIGH (ref 3–12)

## 2010-11-20 LAB — BASIC METABOLIC PANEL
CO2: 26 mEq/L (ref 19–32)
Creatinine, Ser: 1.75 mg/dL — ABNORMAL HIGH (ref 0.4–1.2)
GFR calc Af Amer: 36 mL/min — ABNORMAL LOW (ref >60)
Potassium: 3.1 mEq/L — ABNORMAL LOW (ref 3.5–5.1)
Sodium: 142 mEq/L (ref 135–145)

## 2010-11-20 LAB — TYPE AND SCREEN

## 2010-11-20 LAB — URINALYSIS, ROUTINE W REFLEX MICROSCOPIC
Glucose, UA: NEGATIVE mg/dL
Hgb urine dipstick: NEGATIVE
Nitrite: NEGATIVE
Specific Gravity, Urine: 1.014 (ref 1.005–1.030)
Urobilinogen, UA: 0.2 mg/dL (ref 0.0–1.0)
pH: 5.5 (ref 5.0–8.0)

## 2010-11-20 LAB — SODIUM, URINE, RANDOM: Sodium, Ur: 97 mEq/L

## 2010-11-20 LAB — ABO/RH: ABO/RH(D): A POS

## 2010-12-18 ENCOUNTER — Other Ambulatory Visit: Payer: Self-pay | Admitting: Family Medicine

## 2010-12-18 DIAGNOSIS — I10 Essential (primary) hypertension: Secondary | ICD-10-CM

## 2010-12-18 MED ORDER — LISINOPRIL 20 MG PO TABS: 20.0000 mg | ORAL_TABLET | Freq: Every day | ORAL | Status: DC

## 2010-12-18 MED ORDER — AMLODIPINE BESYLATE 5 MG PO TABS: 5.0000 mg | ORAL_TABLET | Freq: Every day | ORAL | Status: DC

## 2010-12-18 MED ORDER — HYDROCHLOROTHIAZIDE 25 MG PO TABS: 25.0000 mg | ORAL_TABLET | Freq: Every day | ORAL | Status: DC

## 2010-12-18 NOTE — Telephone Encounter (Signed)
Pt asking for refill on bp med until she can be seen, pt has appt on 4/27, pt goes to [facility].

## 2010-12-18 NOTE — Telephone Encounter (Signed)
Refilled

## 2010-12-19 ENCOUNTER — Ambulatory Visit: Payer: Self-pay | Admitting: Family Medicine

## 2010-12-27 ENCOUNTER — Ambulatory Visit: Payer: Self-pay | Admitting: Family Medicine

## 2010-12-30 ENCOUNTER — Encounter: Payer: Self-pay | Admitting: Family Medicine

## 2010-12-30 ENCOUNTER — Ambulatory Visit (INDEPENDENT_AMBULATORY_CARE_PROVIDER_SITE_OTHER): Payer: Self-pay | Admitting: Family Medicine

## 2010-12-30 DIAGNOSIS — F411 Generalized anxiety disorder: Secondary | ICD-10-CM

## 2010-12-30 DIAGNOSIS — E785 Hyperlipidemia, unspecified: Secondary | ICD-10-CM

## 2010-12-30 DIAGNOSIS — I10 Essential (primary) hypertension: Secondary | ICD-10-CM

## 2010-12-30 DIAGNOSIS — T148XXA Other injury of unspecified body region, initial encounter: Secondary | ICD-10-CM

## 2010-12-30 LAB — COMPREHENSIVE METABOLIC PANEL
ALT: 8 U/L (ref 0–35)
AST: 14 U/L (ref 0–37)
Creat: 1.22 mg/dL — ABNORMAL HIGH (ref 0.40–1.20)
Total Bilirubin: 0.7 mg/dL (ref 0.3–1.2)

## 2010-12-30 LAB — CBC
MCH: 26.5 pg (ref 26.0–34.0)
Platelets: 256 10*3/uL (ref 150–400)
RBC: 4.8 MIL/uL (ref 3.87–5.11)

## 2010-12-30 LAB — LIPID PANEL
Cholesterol: 192 mg/dL (ref 0–200)
Total CHOL/HDL Ratio: 4.4 Ratio
VLDL: 25 mg/dL (ref 0–40)

## 2010-12-30 MED ORDER — HYDROCHLOROTHIAZIDE 25 MG PO TABS: 25.0000 mg | ORAL_TABLET | Freq: Every day | ORAL | Status: DC

## 2010-12-30 MED ORDER — PAROXETINE HCL 20 MG PO TABS: 20.0000 mg | ORAL_TABLET | Freq: Every day | ORAL | Status: DC

## 2010-12-30 MED ORDER — AMLODIPINE BESYLATE 5 MG PO TABS: 5.0000 mg | ORAL_TABLET | Freq: Every day | ORAL | Status: DC

## 2010-12-30 MED ORDER — LISINOPRIL 20 MG PO TABS: 20.0000 mg | ORAL_TABLET | Freq: Every day | ORAL | Status: DC

## 2010-12-30 MED ORDER — CYCLOBENZAPRINE HCL 5 MG PO TABS: 5.0000 mg | ORAL_TABLET | Freq: Three times a day (TID) | ORAL | Status: DC | PRN

## 2010-12-30 NOTE — Patient Instructions (Signed)
I am sorry for your loss. Use flexeril- muscle relaxant before bedtime to help with tight muscles. Try to do some exercise every day to help with your mood and your muscles Restart paroxetine I sent all of your medicines to [facility] on [address] [facility] Follow-up in 3-4 weeks

## 2010-12-30 NOTE — Progress Notes (Signed)
  Subjective:    Patient ID: Jennifer Foster, female    DOB: 1946-03-25, 65 y.o.   MRN: [id number]  HPI  Depression:  in 18-Dec-2010: another son died.  Was on dialysis.  Patient has not been taking her medications due to grief.  She has multiple family members who have died while on dialysis.  Is no longer taking paroxetine- what sounds to be due to a misunderstanding [facility]. No [place], [place].  Notes her faith is a source of strength.  Myalgias:  Started after her son's death.  Has been for about 2 weeks.  Described as "achey" in bilateral legs, right shoulder.  No back pain, fever, chills, headache, change in vision, numbness, weakness, tingling, change in bowel or bladder. HYPERTENSION  BP Readings from Last 3 Encounters:  12/30/10 163/85  10/09/10 138/78  08/13/09 164/84    Hypertension ROS: not taking medications regularly as instructed, no medication side effects noted, patient does not perform home BP monitoring, no TIA's, no chest pain on exertion, no dyspnea on exertion, no swelling of ankles and no intermittent claudication symptoms.      Review of Systemssee HPI     Objective:   Physical Exam  Constitutional: She appears well-developed and well-nourished.  HENT:  Head: Normocephalic and atraumatic.  Cardiovascular: Normal rate and regular rhythm.   Pulmonary/Chest: Effort normal and breath sounds normal.  Abdominal: Soft. Bowel sounds are normal.  Musculoskeletal:       Right shoulder: She exhibits normal range of motion, no tenderness, no bony tenderness, no swelling, no effusion and no deformity.       Right knee: She exhibits normal range of motion, no swelling and no effusion. no tenderness found.       Cervical back: She exhibits no tenderness.       Thoracic back: She exhibits no tenderness.       Lumbar back: She exhibits no tenderness.       tender over right trapezius  Psychiatric: Her mood appears anxious. She exhibits a depressed mood. She  expresses no suicidal ideation.          Assessment & Plan:

## 2010-12-30 NOTE — Assessment & Plan Note (Signed)
Likely due to increase in stressors (death of son) and cessation of her SSRI.  No evidence of systemic inflammatory dx such as PMR, GCA or arthritis- does not sound like joint pain today.  Will follow-up with tx with flexeril.

## 2010-12-30 NOTE — Assessment & Plan Note (Signed)
Will check blood work  Today- patient agreeable.  Advised to restart medications.  Will refill and send to [facility].  Will follow-up in 1 month

## 2010-12-30 NOTE — Assessment & Plan Note (Signed)
Will restart today

## 2011-01-01 DIAGNOSIS — E785 Hyperlipidemia, unspecified: Secondary | ICD-10-CM | POA: Insufficient documentation

## 2011-01-01 MED ORDER — PRAVASTATIN SODIUM 40 MG PO TABS: 40.0000 mg | ORAL_TABLET | Freq: Every evening | ORAL | Status: DC

## 2011-01-01 NOTE — Progress Notes (Signed)
Addended by: Suzanna Obey on: 01/01/2011 09:30 AM   Modules accepted: Orders

## 2011-01-08 ENCOUNTER — Emergency Department (HOSPITAL_COMMUNITY): Payer: Self-pay

## 2011-01-08 ENCOUNTER — Emergency Department (HOSPITAL_COMMUNITY)
Admission: EM | Admit: 2011-01-08 | Discharge: 2011-01-08 | Disposition: A | Discharge status: Home / Self Care | Payer: Self-pay | Attending: Emergency Medicine | Admitting: Emergency Medicine

## 2011-01-08 DIAGNOSIS — M542 Cervicalgia: Secondary | ICD-10-CM | POA: Insufficient documentation

## 2011-01-08 DIAGNOSIS — R220 Localized swelling, mass and lump, head: Secondary | ICD-10-CM | POA: Insufficient documentation

## 2011-01-08 DIAGNOSIS — I10 Essential (primary) hypertension: Secondary | ICD-10-CM | POA: Insufficient documentation

## 2011-01-08 DIAGNOSIS — R599 Enlarged lymph nodes, unspecified: Secondary | ICD-10-CM | POA: Insufficient documentation

## 2011-01-14 NOTE — Op Note (Signed)
NAMENATESHA, PLACERES NO.:  [id number]   MEDICAL RECORD NO.:  [id number]          PATIENT TYPE:  INP   LOCATION:  1226                         FACILITY:  [facility]   PHYSICIAN:  Ronald Lobo, M.D.   DATE OF BIRTH:  June 18, 1946   DATE OF PROCEDURE:  10/03/2007  DATE OF DISCHARGE:                               OPERATIVE REPORT   PROCEDURE:  Upper endoscopy.   ENDOSCOPIST:  Ronald Lobo, M.D.   INDICATIONS:  [age]-year-old female with previous history of  recurrent diverticular hemorrhage, who developed hematochezia and  syncope yesterday and has a hemoglobin of 9.1.  Her BUN is normal and  she has no upper tract symptoms, but there is a history of significant  NSAID exposure prior to admission.   FINDINGS:  Mild antral gastropathy characterized by punctate erythema  and a few superficial erosions.  No source of bleeding identified, no  blood in the stomach.   PROCEDURE:  The nature, purpose and risks of the procedure had been  discussed with the patient, who provided written consent and was brought  from her hospital room to the endoscopy unit.  Sedation was fentanyl 50  mcg and Versed 5 mg IV without arrhythmias or desaturation.  The Pentax  video endoscope was passed under direct vision.  The vocal cords grossly  unremarkable.  The esophagus was readily entered and had normal mucosa.  On the way out, there appeared to be some corkscrewing consistent with  possible esophageal spasm.  However, no other esophageal abnormalities  were observed.  Specifically, there was no evidence of reflux  esophagitis, Barrett's esophagus, Mallory-Weiss tear, varices,  infection, neoplasia or any ring, stricture or hiatal hernia.   The stomach was entered.  It contained no blood or coffee-grounds  material.  There was some punctate erythema in the antrum and there were  a couple of very superficial erosions in the antrum as well, as well as  what appeared to be some  marked erythema and a small erosion in the  cardia as seen on retroflexed viewing.  No ulceration or prospective  bleeding site was observed in the stomach; no polyps or masses were  seen.  The pylorus, duodenal bulb and second duodenum were pertinent  only again for some punctate erythema without erosive changes or ulcers  or any source of bleeding identified.   The scope was removed from the patient.  No biopsies were obtained.  She  tolerated the procedure well and there were no apparent complications.   IMPRESSION:  1. No active bleeding or blood in the stomach at the time of this      exam.  2. No prospective bleeding site identified, thus making it much more      likely that the patient's hematochezia is of diverticular origin,      as previously presumed.  3. Mild antral gastritis consistent with nonsteroidal anti-      inflammatory drug exposure.   RECOMMENDATIONS:  1. Treat as for diverticular hemorrhage with clear liquid diet for now      and transfusion support as needed.  2.  Since the patient has had colonoscopy within approximately the past      year, which apparently showed only diverticular disease, I do not      see any indication for doing a colonoscopy on this admission,      particularly if the      bleeding stops in the near future.  3. No specific treatment for the upper tract findings is needed, nor      is followup endoscopy required.   This patient is an unassigned patient.           ______________________________  Ronald Lobo, M.D.     RB/MEDQ  D:  10/03/2007  T:  10/04/2007  Job:  [id number]

## 2011-01-14 NOTE — Discharge Summary (Signed)
Jennifer Foster, Jennifer Foster                ACCOUNT NO.:  [id number]   MEDICAL RECORD NO.:  [id number]          PATIENT TYPE:  INP   LOCATION:  [id number]                         FACILITY:  [facility]   PHYSICIAN:  Hind Franco Collet, MD      DATE OF BIRTH:  1945-10-19   DATE OF ADMISSION:  10/02/2007  DATE OF DISCHARGE:                               DISCHARGE SUMMARY   PRIMARY CARE DOCTOR:  Unassigned.   DISCHARGE DIAGNOSES:  1. Rectal bleeding thought to be secondary to diverticular bleeding.  2. Acute blood loss anemia secondary to #1.  3. Hypertension.  4. Klebsiella pneumoniae urinary tract infection.  5. Abnormal thyroid function test.  6. Upper respiratory tract infection, bronchitis.   DISCHARGE MEDICATION:  To be updated at the date of discharge.   CONSULTATION:  GI, Dr. Cristina Gong, consult is for diverticular bleeding.   PROCEDURES:  1. EGD which showed no active bleeding or __PUD in the stomach.  At      the time of exam, mild gastritis compromised with a history of      nonsteroid antiinflammatory drug use.  No source of hematochezia      seen.  2. Abdominal x-ray.  No plain film evidence of obstruction of free      intraperitoneal air.Cardiomegaly,  3.  X-ray of the hip, negative.  3. Chest x-ray, no acute chest process.   HISTORY OF PRESENT ILLNESS:  1. Please review the history done by Dr. Jana Hakim.  In      summary, this is a 65 year old African American female,      noncompliant with her medications, admitted to the hospital with      complaint of rectal bleeding, pure blood per rectum.  Patient was      admitted to step down for close observations and with an H&H every      6 hours.  Patient, during hospitalization, received total of 3      units of packed RBCs.  GI consult is for evaluation.  Dr. Cristina Gong      evaluated the patient and recommended EGD as the patient has a      history of nonsteroid antiinflammatory drugs use secondary to right      hip arthritis/sciatica.   EGD result as above, diagnosis most      probably then is diverticular bleeding.  Patient kept on close      observations and last bloody bowel movement was on February 1st..      H&H remained stable at this time.  Plan for this patient monitor      another 24 hours.  If there is no evidence of GI bleeding, patient      is stable to be discharged home.  2. A urinary tract infection.  Patient will remain on Rocephin, on      Cipro.  We will switch to Cipro p.o.  3. Right hip pain shooting to the lower leg which most probably is      related to sciatica.  Chest x-ray done which did not show any  evidence of acute process.  Patient received pain medications and      muscular relaxant was good.  Response is above treatment.  4. Hypertension.  Patient to continue clonidine.  During      hospitalization, thyroid function was done which is low.  Repeat      TSH and free T4 __________ .  Result can be followed her primary      care physician.   DISPOSITION:  Patient to be discharged home in 1 to 2 days if no  significant bleeding occurs.      Hind Franco Collet, MD  Electronically Signed     HIE/MEDQ  D:  10/05/2007  T:  10/05/2007  Job:  [id number]

## 2011-01-14 NOTE — H&P (Signed)
NAMESHERLEEN, PANCIERA NO.:  [id number]   MEDICAL RECORD NO.:  [id number]          PATIENT TYPE:  INP   LOCATION:  1226                         FACILITY:  [facility]   PHYSICIAN:  Jana Hakim, M.D. DATE OF BIRTH:  [birth year]   DATE OF ADMISSION:  10/02/2007  DATE OF DISCHARGE:                              HISTORY & PHYSICAL   PRIMARY CARE PHYSICIAN:  Unassigned.   CHIEF COMPLAINT:  Rectal bleeding.   HISTORY OF PRESENT ILLNESS:  This is a 65 year old female who presented  to the emergency department with complaints of rectal bleeding that had  an abrupt start at approximately 1:00 p.m. in the afternoon.  She  reports all of a sudden feeling a crampy abdominal sensation, then  followed by dark red bloody stool running down her pant leg.  She  reports passing out after.  Her friend called her husband to pick her  up, and she decided to go home at that time, and from home, EMS was  called, and she was transported to the hospital.  The patient denies  having any nausea, vomiting, any previous symptoms of diarrhea.  She  does report having dizziness.  She reports when she fell, she also  injured her teeth when she fell forward.   The patient does report having previous similar episodes before in 2002  and again in January 2008.   PAST MEDICAL HISTORY:  Significant for:  1. Hypertension.  2. Anemia.   PAST SURGICAL HISTORY:  Significant for total abdominal hysterectomy and  an oophorectomy.   MEDICATIONS AT THIS TIME:  None.   ALLERGIES:  No known drug allergies.   SOCIAL HISTORY:  The patient is an occasional smoker.  She reports  rarely smoking one whole cigarette.  She also reports drinking one to  two beers daily.   FAMILY HISTORY:  Father died of complications of  cirrhosis from alcohol  abuse.  Mother is also deceased.   REVIEW OF SYSTEMS:  Pertinent as mentioned above.  The patient denies  having any fevers, chills, chest pain, shortness of breath,  nausea,  vomiting, previous diarrhea.   PHYSICAL EXAMINATION:  GENERAL:  This is a thin, older-than-stated-age-  appearing 65 year old female in discomfort but no acute distress.  VITAL SIGNS:  Temperature 100.2, blood pressure 125/70, heart rate 85,  respirations 22 and O2 saturation 94% on room air.  HEENT:  Normocephalic, atraumatic.  Pupils equally round and reactive to  light.  Extraocular muscles are intact. Funduscopic benign. Oropharynx  is clear.  NECK:  Supple. Full range of motion.  No thyromegaly, adenopathy,  jugulovenous distention.  CARDIOVASCULAR:  Regular rate and rhythm.  No murmurs, gallops or rubs.  LUNGS:  Clear to auscultation bilaterally.  ABDOMEN:  Positive bowel sounds, soft, nontender, nondistended.  EXTREMITIES:  Without cyanosis, clubbing or edema.  NEUROLOGIC:  Examination nonfocal.  RECTAL:  Examination performed by the EDP and reported as being heme  positive.   LABORATORY STUDIES:  White blood cell count 5.9, hemoglobin 10.3,  hematocrit 29.5, platelets 171, neutrophils 84%, lymphocytes 8%, MCV  80.5. Chemistry with a sodium  of 139, potassium 3.2, chloride 101, CO2  28, BUN 14, creatinine 1.67 and glucose 100. Alcohol level less than 5.  Lipase 12.  Pro time 13.8, INR 1.0.   ASSESSMENT:  A 65 year old female being admitted with  1. Rectal bleeding.  2. Anemia secondary to #1.  3. Febrile illness.  4. Hypertension.  5. Mild renal insufficiency.   PLAN:  The patient will be admitted to an area for cardiac monitoring.  An anemia workup will also be started.  The patient will be transfused.  She will be placed on IV Protonix therapy, and a GI consultation will  also be placed.  Blood cultures have also been sent, and the patient has  been placed on empiric antibiotic therapy of IV Zosyn.  P.R.N. clonidine  has also been ordered for elevated blood pressures.  Also in the past,  the patient had an evaluation by gastroenterology.  Her last GI   evaluation on her January 2008 admission, she was evaluated by Dr.  Carlean Purl of [facility] and underwent a colonoscopy which  found diverticulosis.  As mentioned above, the patient will be evaluated  by GI while she is hospitalized as well.      Jana Hakim, M.D.  Electronically Signed     HJ/MEDQ  D:  10/03/2007  T:  10/03/2007  Job:  [id number]

## 2011-01-14 NOTE — Consult Note (Signed)
NAME:  Jennifer Foster, AXFORD NO.:  [id number]   MEDICAL RECORD NO.:  [id number]          PATIENT TYPE:  INP   LOCATION:  1226                         FACILITY:  [facility]   PHYSICIAN:  Ronald Lobo, M.D.   DATE OF BIRTH:  03-Feb-1946   DATE OF CONSULTATION:  10/03/2007  DATE OF DISCHARGE:                                 CONSULTATION   GASTROENTEROLOGY CONSULTATION.:  Dr. Laurie Panda of the [facility] asked [place] to see this 65 year old unassigned patient because  of GI bleeding.   [name] has been seen previously by a number of GI physicians in  town.  Most recently, she was seen by Dr. Silvano Rusk about a year ago  because of severe microcytic anemia at which time endoscopy and  colonoscopy were performed and, per the Discharge Summary, the only  abnormal finding was colonic diverticulosis.   The patient has had previous diverticular bleeding, in 2002, and  apparently also had a negative endoscopy and colonoscopy in 2004 for  evaluation of weight loss, anemia and abdominal pain, per the old chart.  The patient indicates she is not followed regularly by any  gastroenterologist, and at this time she does not even have a regular  medical physician.   With that background, the patient had recent use of a moderate amount of  Aleve because of significant pain going down her right leg which has  been bothering her a lot.  Yesterday afternoon she had a single episode  of a dark red bloody bowel movement, and she became syncopal and was  brought by EMS to the emergency room.  Since admission, the patient has  remained hemodynamically stable, has spiked a transient temperature to  101.9, and has apparently only had one further bloody bowel movement.  Current hemoglobin is 9.1, as compared to 10.0 on admission.   PAST MEDICAL HISTORY:  No known allergies.   OUTPATIENT MEDICATIONS:  NSAIDS but no prescription medicines.   OPERATIONS:  Partial hysterectomy.   CHRONIC  MEDICAL ILLNESSES:  None as such, not under treatment for any.  Does not receive regular medical care.  She does have a known history of  diverticular disease, recurrent anemia and apparently also history of  hypertension, but it has not been treated on a regular basis lately.   HABITS:  The patient does smoke occasionally and has two beers a day.   FAMILY HISTORY:  Is apparently positive for coronary disease,  hypertension and diabetes.   SOCIAL HISTORY:  Lives at home with her husband and grandson.  Not  employed.   REVIEW OF SYSTEMS:  Negative for chronic prodromal GI tract symptoms of  any significance.  Apparently she does have occasional rectal bleeding;  she has a tendency toward loose stools, no anorexia, weight loss,  dysphagia, nausea, chronic abdominal pain, constipation or frank  diarrhea.   PHYSICAL EXAMINATION:  GENERAL:  This is a rather withdrawn, quiet  African-American female in intermittent distress due to right leg pain  which seems to be somewhat positional and sounds somewhat neuropathic to  me.  SKIN:  She is  anicteric and without frank pallor.  The skin is warm and  dry.  CHEST:  Has diffuse rhonchi but no rales or wheezes.  CARDIAC:  The heart sounds are distant but I think are normal.  ABDOMEN:  Minimally adipose, soft, and without guarding, mass or  tenderness.   LABORATORY DATA:  Admission hemoglobin was 10.0, currently 9.1, with an  MCV of 80 and an RDW normal at 14, platelets 145,000 on admission, 68  polys, 20 lymphs, 12 monocytes.  INR 1.0. BUN 14 on admission and 15  this morning. Admission potassium was 3.2 but has come up to 3.7. Liver  chemistries within normal limits.  Albumin 2.9 prior to hydration.  Urinalysis showed with moderate dipstick blood with few squamous cells  and large number of leukocytes but only a few bacteria and a few  Trichomonas   IMPRESSION:  1. Recurrent hematochezia over the past 24 hours, with transient       syncope.  2. Posthemorrhagic anemia.  3. History of chronic anemia in the past.  4. History of apparent recurrent diverticular bleeding.   DISCUSSION:  I think that the patient would benefit from endoscopic  evaluation to confirm the absence of an upper tract source, given her  history of transient syncope and the fact that she had antecedent NSAID  exposure that sounded moderately significant.  (I  take some whenever  right leg hurts.)  I have reviewed the nature, purpose and risks of  endoscopic evaluation in this setting with the patient, and she is  agreeable.  Further management will depend on the endoscopic findings.  For now, I agree with IV fluids, IV Protonix, clear liquid diet, and  hemoglobin monitoring with transfusion as needed.           ______________________________  Ronald Lobo, M.D.     RB/MEDQ  D:  10/03/2007  T:  10/03/2007  Job:  [id number]

## 2011-01-16 ENCOUNTER — Encounter: Payer: Self-pay | Admitting: Family Medicine

## 2011-01-16 ENCOUNTER — Inpatient Hospital Stay: Admission: AD | Admit: 2011-01-16 | Payer: Self-pay | Source: Other Acute Inpatient Hospital | Admitting: Pediatrics

## 2011-01-16 ENCOUNTER — Emergency Department (HOSPITAL_COMMUNITY)
Admission: EM | Admit: 2011-01-16 | Discharge: 2011-01-16 | Disposition: A | Discharge status: Other Acute Inpatient Hospital | Payer: Self-pay | Source: Home / Self Care | Attending: Emergency Medicine | Admitting: Emergency Medicine

## 2011-01-16 ENCOUNTER — Ambulatory Visit (HOSPITAL_BASED_OUTPATIENT_CLINIC_OR_DEPARTMENT_OTHER): Admission: RE | Admit: 2011-01-16 | Payer: Self-pay | Source: Ambulatory Visit | Admitting: General Surgery

## 2011-01-16 ENCOUNTER — Inpatient Hospital Stay (HOSPITAL_COMMUNITY)
Admission: EM | Admit: 2011-01-16 | Discharge: 2011-01-17 | DRG: 916 | Disposition: A | Discharge status: Home / Self Care | Payer: Self-pay | Source: Other Acute Inpatient Hospital | Attending: Family Medicine | Admitting: Family Medicine

## 2011-01-16 DIAGNOSIS — T783XXA Angioneurotic edema, initial encounter: Secondary | ICD-10-CM | POA: Insufficient documentation

## 2011-01-16 DIAGNOSIS — Z9119 Patient's noncompliance with other medical treatment and regimen: Secondary | ICD-10-CM

## 2011-01-16 DIAGNOSIS — Z91199 Patient's noncompliance with other medical treatment and regimen due to unspecified reason: Secondary | ICD-10-CM

## 2011-01-16 DIAGNOSIS — E785 Hyperlipidemia, unspecified: Secondary | ICD-10-CM | POA: Insufficient documentation

## 2011-01-16 DIAGNOSIS — J45909 Unspecified asthma, uncomplicated: Secondary | ICD-10-CM | POA: Insufficient documentation

## 2011-01-16 DIAGNOSIS — F411 Generalized anxiety disorder: Secondary | ICD-10-CM | POA: Diagnosis present

## 2011-01-16 DIAGNOSIS — I129 Hypertensive chronic kidney disease with stage 1 through stage 4 chronic kidney disease, or unspecified chronic kidney disease: Secondary | ICD-10-CM | POA: Diagnosis present

## 2011-01-16 DIAGNOSIS — F17200 Nicotine dependence, unspecified, uncomplicated: Secondary | ICD-10-CM | POA: Diagnosis present

## 2011-01-16 DIAGNOSIS — Z79899 Other long term (current) drug therapy: Secondary | ICD-10-CM

## 2011-01-16 DIAGNOSIS — F1410 Cocaine abuse, uncomplicated: Secondary | ICD-10-CM | POA: Diagnosis present

## 2011-01-16 DIAGNOSIS — N183 Chronic kidney disease, stage 3 unspecified: Secondary | ICD-10-CM | POA: Diagnosis present

## 2011-01-16 DIAGNOSIS — D649 Anemia, unspecified: Secondary | ICD-10-CM | POA: Diagnosis present

## 2011-01-16 DIAGNOSIS — T46905A Adverse effect of unspecified agents primarily affecting the cardiovascular system, initial encounter: Secondary | ICD-10-CM | POA: Diagnosis present

## 2011-01-16 DIAGNOSIS — I10 Essential (primary) hypertension: Secondary | ICD-10-CM | POA: Insufficient documentation

## 2011-01-16 LAB — URINALYSIS, ROUTINE W REFLEX MICROSCOPIC
Glucose, UA: NEGATIVE mg/dL
Ketones, ur: NEGATIVE mg/dL
Protein, ur: NEGATIVE mg/dL
pH: 5.5 (ref 5.0–8.0)

## 2011-01-16 LAB — RAPID URINE DRUG SCREEN, HOSP PERFORMED
Barbiturates: NOT DETECTED
Benzodiazepines: NOT DETECTED
Cocaine: POSITIVE — AB
Opiates: NOT DETECTED

## 2011-01-16 LAB — CBC
MCV: 75.9 fL — ABNORMAL LOW (ref 78.0–100.0)
Platelets: 264 10*3/uL (ref 150–400)
RBC: 4.64 MIL/uL (ref 3.87–5.11)
WBC: 9.2 10*3/uL (ref 4.0–10.5)

## 2011-01-16 LAB — DIFFERENTIAL
Basophils Absolute: 0.1 10*3/uL (ref 0.0–0.1)
Eosinophils Absolute: 0 10*3/uL (ref 0.0–0.7)
Eosinophils Relative: 0 % (ref 0–5)
Monocytes Absolute: 0.9 10*3/uL (ref 0.1–1.0)
Neutrophils Relative %: 63 % (ref 43–77)

## 2011-01-16 LAB — BASIC METABOLIC PANEL
GFR calc Af Amer: 32 mL/min — ABNORMAL LOW (ref >60)
GFR calc non Af Amer: 26 mL/min — ABNORMAL LOW (ref >60)
Potassium: 3.4 mEq/L — ABNORMAL LOW (ref 3.5–5.1)
Sodium: 131 mEq/L — ABNORMAL LOW (ref 135–145)

## 2011-01-16 LAB — ETHANOL: Alcohol, Ethyl (B): 47 mg/dL — ABNORMAL HIGH (ref 0–10)

## 2011-01-16 LAB — IRON AND TIBC
Iron: 112 ug/dL (ref 42–135)
TIBC: 316 ug/dL (ref 250–470)
UIBC: 204 ug/dL

## 2011-01-16 LAB — URINE MICROSCOPIC-ADD ON

## 2011-01-16 LAB — VITAMIN B12: Vitamin B-12: 525 pg/mL (ref 211–911)

## 2011-01-16 NOTE — Progress Notes (Unsigned)
Jennifer Foster Admission History and Physical  Patient name: Jennifer Foster Medical record number: [id number] Date of birth: 12/15/1945 Age: 65 y.o. Gender: female  Primary Care Provider: Suzanna Obey, MD  Chief Complaint: Angioedema  History of Present Illness: Jennifer Foster is a 65 y.o. year old female presenting with angioedema Pt with history of hypertension and medication noncompliance. Pt has been intermittently taking BP meds including norvasc, HCTZ, and lisinopril. Was recently seen in clinic by PCP on 12/30/10 and instructed to take BP meds. Pt has been taking lisinopril intermittently since this visit. Pt states that she took lisinopril  2-3 times last week. Most recent use was 01/14/11. Pt woke up around 4  this am with tongue tingling and swelling. No fevers, increased WOB, flushing, rash. Pt's husband then brought pt to [facility]. Pt given  125 mg solumedrol, pepcid and benadryl. Pt reports moderate relief in tongue swelling s/p solumedrol and benadryl. Pt denies any breathing difficulties during course of tongue swelling.   Patient Active Problem List  Diagnoses  . ANEMIA  . Generalized anxiety disorder  . TOBACCO USER  . HYPERTENSION, BENIGN ESSENTIAL  . GASTRITIS, CHRONIC  . CHRONIC KIDNEY DISEASE STAGE III (MODERATE)  . SHOULDER PAIN  . BLOOD IN STOOL, OCCULT  . DIVERTICULAR BLEEDING, HX OF  . Muscle strain  . Hyperlipidemia LDL goal < 100    Past Surgical History: Past Surgical History  Procedure Date  . Abdominal hysterectomy 1981    partial, per pt history    Social History: History   Social History  . Marital Status: Married    Spouse Name: N/A    Number of Children: N/A  . Years of Education: N/A   Occupational History  . unemployed    Social History Main Topics  . Smoking status: Current Some Day Smoker    Types: Cigarettes  . Smokeless tobacco: Not on file  . Alcohol Use: Not on file  . Drug Use: Not on file  . Sexually Active:  Yes   Other Topics Concern  . Not on file   Social History Narrative   Lives with husband Jennifer Foster who is also an [facility] patient.  History of Marijuana and crack use.    Family History: Family History  Problem Relation Age of Onset  . Diabetes type II    . Kidney failure    . Kidney failure Son     Allergies: No Known Allergies  No current outpatient prescriptions on file.   Review Of Systems: Per HPI with the following additions:See HPI Otherwise 12 point review of systems was performed and was unremarkable.  Physical Exam: [id number]      Blood Pressure:171-211/81-97  RR: 20   O2: 96% on RA Temp: 98.7 General: alert, appears older than stated age and mildly disheveled appearing  HEENT: PERRLA, extra ocular movement intact and sclera clear, anicteric; Mild glossal swelling. No lip swelling.  Heart: S1, S2 normal, no murmur, rub or gallop, regular rate and rhythm Lungs: clear to auscultation, no wheezes or rales and unlabored breathing Abdomen: abdomen is soft without significant tenderness, masses, organomegaly or guarding Extremities: extremities normal, atraumatic, no cyanosis or edema Skin:no rashes, no ecchymoses, no petechiae Neurology: normal without focal findings, mental status, speech normal, alert and oriented x3, PERLA and reflexes normal and symmetric  Labs and Imaging: Lab Results  Component Value Date/Time   NA 131* 01/16/2011  6:44 AM   K 3.4* 01/16/2011  6:44 AM   CL 91* 01/16/2011  6:44 AM   CO2 23 01/16/2011  6:44 AM   BUN 23 01/16/2011  6:44 AM   CREATININE 1.93* 01/16/2011  6:44 AM   CREATININE 1.22* 12/30/2010  2:19 PM   GLUCOSE 79 01/16/2011  6:44 AM   Lab Results  Component Value Date   WBC 9.2 01/16/2011   HGB 12.2 01/16/2011   HCT 35.2* 01/16/2011   MCV 75.9* 01/16/2011   PLT 264 01/16/2011   UDS-Cocaine + ETOH level- 47  UA: trace blood; otherwise WNL     Assessment and Plan: [name] is a 65 y.o. year old female presenting with  angioedema Angioedema- Likely secondary to ACEi use. Will note on allergy list. Will observe overnight. Will continue to monitor. Will schedule benadryl until am. Will give pepcid in am. HTN- Will restart on home meds. Will hold ACE. Pt should not use ACEs or ARBs in the future. Will hold on beta blocker use in setting of cocaine + UDS. Will continue to follow.  CKD- Baseline Cr 1.2-1.7.  Will gently hydrate. Will continue to follow.  Anemia- Overall stable. Noted microcytosis. Will check anemia panel. Pt would likely benefit from iron.  Psych- Continue home paxil.    FEN/GI: NS @ 75/hr. Passed bedside swallow. Heart healthy diet.  Prophylaxis: heparin, protonix Disposition: Pending further evaluation.

## 2011-01-17 LAB — BASIC METABOLIC PANEL
CO2: 27 mEq/L (ref 19–32)
Chloride: 102 mEq/L (ref 96–112)
Creatinine, Ser: 1.59 mg/dL — ABNORMAL HIGH (ref 0.4–1.2)
GFR calc Af Amer: 40 mL/min — ABNORMAL LOW (ref >60)
Potassium: 3.5 mEq/L (ref 3.5–5.1)

## 2011-01-17 NOTE — Discharge Summary (Signed)
Jennifer Foster, Jennifer Foster NO.:  [id number]   MEDICAL RECORD NO.:  [id number]          PATIENT TYPE:  INP   LOCATION:  [id number]                         FACILITY[facility]   PHYSICIAN:  Sherryl Manges, M.D.  DATE OF BIRTH:  [birth year]   DATE OF ADMISSION:  09/13/2006  DATE OF DISCHARGE:  09/16/2006                               DISCHARGE SUMMARY   PRIMARY MEDICAL DOCTOR:  Althia Forts.   DISCHARGE DIAGNOSES:  1. Severe microcytic anemia, likely nutritional, however, may be      secondary to acute on chronic gastrointestinal blood loss.  2. Diverticulosis.  3. Hypertension.  4. Smoking history.  5. Renal insufficiency.   DISCHARGE MEDICATIONS:  1. Norvasc 10 mg p.o. daily.  2. Lisinopril 10 mg p.o. daily.  3. Nu-Iron 150 mg p.o. daily.   PROCEDURES:  1. Two-view chest x-ray dated September 13, 2006:  This showed      cardiomegaly, but no active disease.  2. Upper gastrointestinal endoscopy dated September 15, 2006 by Dr. Silvano Rusk:  This was unremarkable.  Biopsies were done.  3. Colonoscopy dated September 15, 2006 by Dr. Silvano Rusk:  This      showed diverticulosis, but no evidence of bleed, otherwise normal      to distal terminal ileum.   CONSULTATIONS:  Dr. Silvano Rusk, [facility].   ADMISSION HISTORY:  As per H&P notes of September 13, 2006, dictated by  Dr. Malen Gauze. However, in brief, this is a 65 year old female, with  known history of hypertension, previous hematochezia secondary to  diverticular bleed requiring hospitalization, September 2002, smoking  history, history of hysterectomy with partial oophorectomy, who presents  with increasing weakness.  There is a vague history of intermittent  episodes of passage of bright red blood per rectum over the last year,  although this has not occurred in the past 2 months.  Approximately 2  weeks ago, she had 1 week of passage of dark black stool which resolved  spontaneously.  On evaluation in the  emergency department, she was noted  to have a severe anemia with hemoglobin of 5.8 and MCV of 60.  She was  admitted for further evaluation, investigation and management.   CLINICAL COURSE:  PROBLEM #1 - PROFOUND ANEMIA:  This is microcytic and  was deemed secondary to acute on chronic GI blood loss, given the  patient's background history.  The patient was stool guaiac-negative,  however, and iron studies showed the following findings:  Iron 22, TIBC  365, percentage saturation 6, [id number], folic 8.  Anemia was addressed  with the administration of 2 units of PRBCs and GI consultation was  called, which was kindly provided by Dr. Silvano Rusk, [facility], who  performed upper and lower GI endoscopies on September 15, 2006.  These  studies were remarkable only for colonic diverticulosis.  No actual  bleeding source was found.  Conclusion, was that the patient's anemia is  likely nutritional although it may of course, be from lower small bowel.  Recommendations were to transfuse iron dextran, this was done  accordingly, and oral iron supplements continued.  Dr. Carlean Purl has  recommended that the patient follow up with him on September 29, 2006; at  this time, possible capsule endoscopy will be considered.  Be that as it  may, the patient's hemoglobin levels have remained stable with a  satisfying bump up to 9.6 on September 14, 2006.  By September 16, 2006,  hemoglobin was reasonable at 9.9.  The patient has therefore been  commenced on iron supplements.  Of note, no episodes of hematochezia  were noted throughout the patient's hospitalization.   PROBLEM #2 - HYPERTENSION:  The patient was noticed to be hypertensive,  during the course of the hospitalization. This has been addressed with a  combination of Norvasc and ACE inhibitor.   PROBLEM #3 - SMOKING HISTORY:  The patient has been counseled  appropriately.   PROBLEM #4 - RENAL INSUFFICIENCY:  At the time of presentation, the  patient was  noted to have a creatinine of 1.3, which corresponds to a  GFR of approximately 45.  This was addressed with intravenous fluid  hydration and we are pleased to note that on September 16, 2006,  creatinine was entirely reasonable at 0.98.   DISPOSITION:  The patient was considered sufficiently linically  recovered and stable to be discharged on September 16, 2006.   DIET:  Healthy-heart diet.   WOUND CARE:  Not applicable.   ACTIVITY:  As tolerated.   FOLLOWUP INSTRUCTIONS:  The patient was recommended to follow up with  Dr. Neita Goodnight, [facility], on September 29, 2006 at 9  a.m., telephone number [phone].  She has also been strongly  recommended to established with a primary MD, for routine and  preventative care, to be seen within 1-2 weeks of discharge.  The  patient is agreeable with this plan and has assured me that she wants to  establish a primary MD at the [facility], and will call as soon  as she gets home.  She appears to have adequate insurance and therefore  has a wide choice of primary MDs.      Sherryl Manges, M.D.  Electronically Signed     CO/MEDQ  D:  09/16/2006  T:  09/16/2006  Job:  [id number]   cc:   Gatha Mayer, MD,FACG  [facility]  [address] [place], [place] [address]

## 2011-01-17 NOTE — H&P (Signed)
[facility]  Patient:    Jennifer Foster, Jennifer Foster Visit Number: [id number] MRN: [id number]          Service Type: Attending:  Mayme Genta, M.D. Dictated by:   Mayme Genta, M.D. Adm. Date:  05/09/01                           History and Physical  REASON FOR ADMISSION:  Lower GI hemorrhage.  HISTORY OF PRESENT ILLNESS:  Jennifer Foster is a 65 year old African-American female presenting to the [facility] earlier today because of hematochezia, onset of symptoms beginning five days ago, May 05, 2001. Two to three large-volume bloody stools at home.  Developed orthostatic syncope.  Did not seek medical attention.  Symptoms spontaneously resolved without difficulty over the next 72 hours.  This morning she again had two large bloody bowel movement, for which she came to the emergency room.  No recurrent orthostatic symptoms.  Patient denies melena or hematemesis.  No abdominal pain, dyspepsia.  Appetite remains good, weight is stable; no nausea, no vomiting.  Denies fever, rigor or chill.  Risk factors are limited.  No prior history of GI disease.  Uses Aleve or Advil approximately twice weekly, not to excess.  Drinks alcohol one to two days per week, typically a few cans of beer; again, patient denies excessive use.  Tobacco smoking is limited to several cigarettes per week.  Consume less than a half pack per day, and does not smoking regularly.  No prior GI evaluation.  PAST MEDICAL HISTORY: 1. Hypertension. 2. Status post hysterectomy, partial oophorectomy.  ALLERGIES:  No known drug allergy.  CURRENT MEDICAL REGIMEN:  None.  SOCIAL HISTORY:  Patient is married.  Husband works in Oncologist for [employer].  The patient spends most of her time baby-sitting her two grandchildren and watching television.  Full-time homemaker.  Does not work. The patient again admits to occasional alcohol and tobacco use but not  to excess.  FAMILY HISTORY:  One child, two grandchildren.  No contributory family history.  REVIEW OF SYSTEMS:  Overall health has been excellent, some decreased energy over the past several days.  Denies headache, visual or auditory disturbance or decrease in acuity.  No difficulty swallowing.  Denies odynophagia or dysphagia.  RESPIRATORY:  No shortness of breath, cough, dyspnea on exertion. No history of pneumonia.  CARDIOVASCULAR:  Denies chest pain, palpitation, chest pressure.  No history of heart disease.  Hypertension diagnosed remotely but patient does not have a primary care physician.  At times she has been treated with antihypertensives which she gets on a sporadic basis through a free clinic.  GI:  See HPI.  GU:  No dysuria, polyuria or nocturia.  No history of kidney stones.  NEUROLOGIC:  Denies any difficulties in motor or sensory function.  No headaches.  No visual disturbance.  PHYSICAL EXAMINATION:  GENERAL:  Healthy-appearing middle-aged African-American female, alert and oriented, in no acute distress, vital signs stable, anxious, teary, but in no distress.  HEENT:  Mild pallor, anicteric sclerae.  Mouth:  Dentition in fair repair.  No oropharyngeal lesion of the lip, gums or tongue.  NECK:  Supple.  No adenopathy, thyromegaly, bruit.  CHEST:  Clear to auscultation without adventitious sound, clear bilaterally.  BREASTS:  Small without mass, nipple discharge or axillary adenopathy.  CARDIAC:  Regular rhythm.  No gallop, no murmur.  ABDOMEN:  Mild obesity, soft, nontender, nondistended.  No palpable  organomegaly.  No mass.  No firmness.  Bowel sounds active without borborygmi, bruit or splash.  RECTAL:  Not repeated.  Reportedly with gross heme-positive stool earlier today.  EXTREMITIES:  No clubbing, cyanosis, or edema.  Full range of motion.  NEUROLOGIC:  Grossly intact with focal deficit.  SKIN:  No stigmata of chronic liver disease.  No lesion  identified.  Midline subumbilical scar from hysterectomy/partial oophorectomy.  LABORATORY DATA:  Hemoglobin 9.3, falling from 9.6 earlier this morning, hematocrit 26.4.  Pro time INR 1.0.  CMET normal except albumin 3.0.  Most notably with normal BUN.  ASSESSMENT: 1. Lower gastrointestinal hemorrhage -- suspect diverticular bleed, given the    painless nature of the patients symptoms, stuttering course, and rather    large blood loss.  Need to rule out malignancy.  Arteriovenous malformation    should also be considered.  Doubt ischemic, infectious or inflammatory    colitis, given lack of systemic symptoms. 2. Anemia secondary to acute gastrointestinal blood loss.  RECOMMENDATION: 1. Supportive therapy -- IV fluid, bedrest, bowel rest.  Transfuse p.r.n.    Monitor vital signs, H/H, output. 2. IV Protonix 40 mg IV q.24h. 3. Diagnostic -- anticipate colonoscopy tomorrow, late afternoon, assuming    that she is stable overnight.  Will prep tomorrow with GoLYTELY. Dictated by:   Mayme Genta, M.D. Attending:  Mayme Genta, M.D. DD:  05/09/01 TD:  05/10/01 Job: [id number]

## 2011-01-17 NOTE — Consult Note (Signed)
NAME:  Jennifer Foster, Jennifer Foster NO.:  [id number]   MEDICAL RECORD NO.:  [id number]          PATIENT TYPE:  INP   LOCATION:  1621                         FACILITY:  [facility]   PHYSICIAN:  Gatha Mayer, MD,FACGDATE OF BIRTH:  [birth year]   DATE OF CONSULTATION:  DATE OF DISCHARGE:                                 CONSULTATION   REQUESTING PHYSICIAN:  Sherryl Manges, M.D.   PRIMARY CARE PHYSICIAN:  Unassigned.   REASON FOR CONSULTATION:  Severe microcytic anemia.   ASSESSMENT:  Severe microcytic anemia, some history of bright red blood  per rectum, although history-giving is somewhat poor.  Previous history  of colonoscopy on September, 2002 showing a clot in a diverticulum.  She  was iron deficient at that time.  This is iron-deficiency anemia,  chronic occult bleeding is possible, with the other bleeding mentioned.  Nutritional anemia is possible as well.  Malabsorption possible but  seems less likely, as she is moderately obese.  She also had egative EGD  and colonoscopy in 2004 (evaluation of weight loss, anemia, abdominal  pain).   PLAN:  Schedule colonoscopy and upper GI endoscopy for tomorrow.  Further plans pending that.  She understands the risks, benefits and  indications.   HISTORY:  A 65 year old African-American woman was admitted on January  13th for the severe symptomatic anemia.  She complains of some bright  red blood per rectum within the past month or so and occasional clots  but not in a while.  It was a large dark black stool about three weeks  ago, she says.  As best I can tell, she has not been using NSAIDs.  Previous history as above.  She has had perhaps a spell or two of vague  dysphagia when drinking soda, but I cannot get a sense of any solid food  dysphagia.  She denies any abdominal pain.  She has been weak and short  of breath.  She was using some iron at home.  She has eaten corn starch  out of the box since she was a teenager, and her  sister and mother  chronically consume that as well.   REVIEW OF SYSTEMS:  As above, otherwise comprehensive review of systems  is negative.   PAST MEDICAL HISTORY:  1. Hypertension.  2. Status post hysterectomy with partial oophorectomy.  3. Prior history of GI bleed thought secondary to diverticulosis with      iron deficiency at that time.  4. Tobacco smoker, also some use of marijuana and she said inadvertant      use of crack cocaine and alcohol use.   OUTPATIENT MEDICATIONS:  None.   DRUG ALLERGIES:  None known.   HOSPITAL MEDICATIONS:  IV Protonix.   FAMILY HISTORY:  Father died of alcoholic cirrhosis.  Mother had an  unknown cancer and may have had peripheral vascular disease.   PHYSICAL EXAMINATION:  GENERAL:  A chronically ill black woman.  VITAL SIGNS:  Temperature 98.5, blood pressure 174/80, pulse 60,  respirations 18.  HEENT:  Eyes are anicteric.  Conjunctivae are somewhat pale.  The palate  is somewhat pale.  Teeth are in poor shape.  There are only some  remaining.  NECK:  Supple without mass or thyromegaly.  CHEST:  Clear.  HEART:  S1 and S2.  No murmurs, rubs or gallops.  ABDOMEN:  Soft and nontender.  No organomegaly or mass.  It is somewhat  obese.  RECTAL:  She was hemoccult negative on admission rectal exam.  This is  not repeated.  EXTREMITIES:  Free of clubbing, cyanosis or edema.  NEURO/PSYCH:  She is alert and oriented x3.  SKIN:  Warm and dry.  No acute rash.   LAB DATA:  Her admission CBC showed a hemoglobin of 5.8 with an MCV of  59, white count 6.1, platelet count 199.  She has been transfused.  Her  hemoglobin is now 8.7.  Platelet count 162,000.  Her TSH is normal at  0.754.  Thyroid function panel is normal.  Coags are normal .   Chest x-ray negative.   I appreciate the opportunity to care for this patient.      Gatha Mayer, MD,FACG  Electronically Signed     CEG/MEDQ  D:  09/14/2006  T:  09/14/2006  Job:  [id number]   cc:    Sherryl Manges, M.D.

## 2011-01-17 NOTE — H&P (Signed)
NAME:  Jennifer Foster, Jennifer Foster NO.:  [id number]   MEDICAL RECORD NO.:  [id number]          PATIENT TYPE:  EMS   LOCATION:  ED                           FACILITY:  [facility]   PHYSICIAN:  Cherene Altes, M.D.DATE OF BIRTH:  1946-05-03   DATE OF ADMISSION:  09/13/2006  DATE OF DISCHARGE:                              HISTORY & PHYSICAL   PRIMARY CARE PHYSICIAN:  Unassigned.   CHIEF COMPLAINT:  Severe weakness.   HISTORY OF PRESENT ILLNESS:  Ms. Jennifer Foster is a very pleasant 65-year-  old female who does not receive medical attention on a regular basis.  She states over the last year she has had numerous intermittent episodes  of bright red blood per rectum.  She has not had this in over 2 months  now, she reports.  She does admit that approximately 2 weeks ago, she  had 1 week of dark black stool which then resolved on its own.  She  does admit to using iron at home.  She otherwise has been in her usual  state of health with no complaints whatsoever.  Over the last 48 hours  she has felt severely weak.  She reports that when she stands, she  becomes somewhat lightheaded.  She feels that she has no energy.  When  she physically exerts herself, she becomes tired very quickly.  Symptoms  were much today when attending church and therefore she decided to  present to the emergency room.  There has been no nausea, vomiting,  chest pain, shortness of breath, fever, chills, change in appetite,  change in hair quality of texture, hematemesis, hemoptysis, abdominal  pain, diarrhea, or constipation.  Interestingly, the patient does report  that she has eaten cornstarch out of the box since she was a teenager  and also reports that she has both a sister and mother who chronically  consumed cornstarch as well.  She is not aware of any significant family  history of severe anemia.  She is not aware of any family connection to  individuals of [place] decent.  There has not been  any other  source of bleeding and the patient has had a hysterectomy.   REVIEW OF SYSTEMS:  Comprehensive review of systems is unremarkable with  the exception of elements noted in the history of present illness above.   PAST MEDICAL HISTORY:  1. Hypertension.  2. Status post hysterectomy with partial oophorectomy.  3. Hematochezia requiring hospitalization in September of 2002 leading      to colonoscopy which revealed diverticulosis.  4. Tobacco abuse in the amount of less than 1/2 pack per day.   OUTPATIENT MEDICATIONS:  None.   ALLERGIES:  No known drug allergies.   FAMILY HISTORY:  The patient's mother died of an unknown cancer, but it  sounds as though she might have had diabetes with peripheral vascular  disease.  The patient's father died related to cirrhosis due to alcohol  abuse.   SOCIAL HISTORY:  The patient occasionally partakes of alcohol, but never  more than 3-4 drinks in an entire week.  She is  married.  She has one  son who is healthy and she raises her grandson.   DATA REVIEW:  Sodium, potassium, chloride, bicarb, and BUN are normal.  Creatinine is elevated at 1.3 with GFR approximately 45.  Serum glucose  and calcium were normal.  The patient is guaiac negative in the  emergency room.  White count and platelet count are normal.  MCV is low  at 60 with a hemoglobin low at 5.8, INR 1.0, PTT 28.   Chest x-ray reveals cardiomegaly.  12-lead EKG reveals normal sinus  rhythm at 61 beats per minute with left ventricular hypertrophy.   PHYSICAL EXAMINATION:  VITAL SIGNS:  Temperature 98.9, blood pressure  122/65, heart rate 67, respiratory rate 22, O2 saturations 100% on room  air.  GENERAL:  A well-developed, well-nourished, female in no acute  respiratory distress who is remarkably pale despite her African-American  ethnicity.  HEENT:  Normocephalic and atraumatic.  Pupils equal, round, and reactive  to light and accommodation.  Extraocular muscles intact  bilaterally.  OC/OP clear.  NECK:  No JVD, no lymphadenopathy.  LUNGS:  Clear to auscultation bilaterally without wheezes or rhonchi.  CARDIOVASCULAR:  Regular rate and rhythm with occasional ectopic beats  without murmurs, rubs, or gallops, with normal S1 and S2.  ABDOMEN:  Mildly obese, soft, bowel sounds present.  No  hepatosplenomegaly.  No rebound and no ascites.  EXTREMITIES:  No significant cyanosis, clubbing, or edema bilaterally  lower extremities.  NEUROLOGY:  5/5 strength bilateral upper and lower extremities.  Intact  sensation to touch throughout.  RECTAL:  Guaiac negative per EDP.   IMPRESSION:  1. Severe microcytic anemia - blood transfusion has already been      initiated in the emergency room.  This, therefore, eliminates by      ability to obtain hemolysis labs.  Iron studies would also not be      helpful at this time due to this.  I will therefore continue with      transfusion until we reached a hemoglobin of 8.0 or greater.  The      patient will clearly need a GI workup and we will consult a      gastroenterologist in the morning.  I have asked the patient about      NSAID abuse and she reports that she uses Advil, but not more than      2-3 times in an entire week.  She does not have symptoms to suggest      a gastritis or peptic ulcer disease.  Her family history of starch      consumption is intriguing.  I wonder about the possibility of a      thalassemia.  Though this is typically associated with people of      [place] decent, it can also be seen in patients with [place].  The patient is not sure of her families' roots,      but does report that at least the last three generations have lived      solely in [place].  She is not aware of any Mediterranean      individuals in her family tree.  The patient may need to ultimately      be tested for alpha and beta thalassemia if her GI evaluation is     unrevealing.  2. Mild acute  renal insufficiency.  The patient has an elevated      creatinine of  1.3.  She does have uncontrolled hypertension by      history.  I feel, however, that this is likely due to decreased      perfusion due to significantly decreased hemoglobin.  We will      transfuse the patient.  I will hydrate her with normal saline.  We      will follow her renal function.  3. Hypertension.  The patient's hypertension is poorly controlled.      EKG does reveal evidence of left ventricular hypertrophy.  At      present, however, the patient's blood pressure is stable.  This is      likely secondary to her severe hypovolemic state.  I expect that      with transfusion, the patient's blood pressure will increase.  We      will initiate therapy as indicated.  4. Tobacco abuse.  The patient's tobacco use is extremely limited.  I      have advised her that she should discontinue smoking altogether.      We will request a tobacco cessation consultation to further      encourage this point during this hospital stay.      Cherene Altes, M.D.  Electronically Signed     JTM/MEDQ  D:  09/13/2006  T:  09/13/2006  Job:  [id number]

## 2011-01-17 NOTE — Discharge Summary (Signed)
[facility]  Patient:    Jennifer Foster, Jennifer Foster Visit Number: [id number] MRN: [id number]          Service Type: SUR Location: 4W 0460 01 Attending Physician:  Harriette Bouillon Dictated by:   Mayme Genta, M.D. Admit Date:  05/09/2001 Disc. Date: 05/12/01                             Discharge Summary  DISCHARGE DIAGNOSES: 1. Diverticular hemorrhage. 2. Anemia, acute, secondary to acute gastrointestinal blood loss. 3. Hypertension. 4. Iron-deficiency.  HOSPITAL COURSE:  The patient was admitted on 05/09/01, after presenting to the emergency room with hematochezia.  This actually began five days prior to admission on 05/05/01.  Orthostatic symptoms that led to ER visit.  Found to have bright red blood per rectum in the emergency room.  Admitting labs included a hemoglobin of 9.6, hematocrit 27.7.  The patient was initially watched expectantly, supported with IV fluids.  Hematochezia persisted, and hemoglobin fell to 7.9 with a hematocrit of 22.3.  She received 1 unit blood transfusion with improvement in her hemoglobin to 9.5.  This has been stable over the past 24 hours.  No further hematochezia over the past 48 hours.  No orthostatic symptoms.  The patient underwent colonoscopy and was found to have a large blood clot streaming from one of the diverticuli founded.  No other pathology was identified.  This was felt to be the source of her gastrointestinal hemorrhage, e.g. proximal descending colon diverticulum.  There was no evidence of colorectal neoplasia.  No evidence for upper gastrointestinal hemorrhage.  No endoscopic therapy was applied.  Expectant management resulted in a continued stable course.  The patients diet was advanced without difficulty.  At the time of discharge, the patient is stable with a hemoglobin of 9.6.  Additional laboratory evaluation included findings of a mild iron deficiency with a transferrer saturation 16%.  Again,  colonoscopy did not reveal any lesion to explain her iron-deficiency.  Iron supplementation was withheld during her acute stay in order to avoid any confusion with regard to ongoing gastrointestinal blood loss, e.g. black stool.  An additional finding was that of hypertension, poorly controlled.  This was known from the patients past medical history.  She had been treated with antihypertensives in the past, but had not been taking these for several months.  This may have contributed to her diverticular bleed.  Atenolol 25 mg q.d. was started with improvement in her blood pressure, though this remained elevated at the time of discharge at 198/92.  She is to be discharged on a higher dose.  DISCHARGE MEDICATIONS: 1. Atenolol 50 mg q.d. 2. Ferrous sulfate 325 mg one t.i.d., beginning in one month.  ADDITIONAL RECOMMENDATIONS: 1. Establish with a primary care physician for general health care concerns,    and to monitor the hypertensive therapy. 2. Return office visit to see me in 2 to 3 weeks. 3. Low residue diet for one month. 4. Repeat colonoscopy in 5 years for colorectal neoplasia surveillance. 5. Monitor iron replacement therapy in approximately 2 to 3 months with    iron/transferrer.  This will be deferred to the patients primary care    physician. Dictated by:   Mayme Genta, M.D. Attending Physician:  Harriette Bouillon DD:  05/12/01 TD:  05/12/01 Job: [id number]

## 2011-01-22 ENCOUNTER — Ambulatory Visit (INDEPENDENT_AMBULATORY_CARE_PROVIDER_SITE_OTHER): Payer: Self-pay | Admitting: Family Medicine

## 2011-01-22 ENCOUNTER — Encounter: Payer: Self-pay | Admitting: Family Medicine

## 2011-01-22 VITALS — BP 122/76 | HR 73 | Temp 97.6°F | Ht 64.0 in | Wt 158.0 lb

## 2011-01-22 DIAGNOSIS — I10 Essential (primary) hypertension: Secondary | ICD-10-CM

## 2011-01-22 DIAGNOSIS — F1490 Cocaine use, unspecified, uncomplicated: Secondary | ICD-10-CM

## 2011-01-22 DIAGNOSIS — F1410 Cocaine abuse, uncomplicated: Secondary | ICD-10-CM

## 2011-01-22 DIAGNOSIS — F411 Generalized anxiety disorder: Secondary | ICD-10-CM

## 2011-01-22 MED ORDER — LOSARTAN POTASSIUM 50 MG PO TABS: 50.0000 mg | ORAL_TABLET | Freq: Every day | ORAL | Status: DC

## 2011-01-22 NOTE — Assessment & Plan Note (Signed)
Patient states it was a one time use against her knowledge and not relapse.  She states she does not plan to be around these people any longer.  Discussed with patient effects on heart and mood.  Will continue to monitor.

## 2011-01-22 NOTE — Assessment & Plan Note (Signed)
Again discussed importance of regular daily use to avoid side effects.  Has had good response when taking regularly.  Gave her # for Dr. Gwenlyn Saran, patient states she feels it would be helpful to talk to someone about coping with difficult life situations and stress.

## 2011-01-22 NOTE — Progress Notes (Signed)
  Subjective:    Patient ID: Jennifer Foster, female    DOB: 1946/04/05, 65 y.o.   MRN: [id number]  HPI Follow-up hospitalization:  Admitted 5/17-5/18 for angioedema likely secondary to ACE-I.  Patient was started on Cozaar for additional blood pressure control as her BP was high during hospitalization.  Was also noted to be cocaine positive. During this hospitalization.  Patient states it was not regular use and was given something to smoke by bad influences.  Her husband has encouraged her to not be around these friends.  Patient has not taken Cozaar as she is waiting for husbands check to pick up.  She thinks it may cost around $17.  Stress:  She has noted increase in stress, caring for adult grandson.  Not taking paroxetine regularly.  She has had passive thoughts of death but has not contemplated suicide.  Says she usually prays when she starts to feel hopeless.  She feels it would be helpful to talk to someone about her stress  Review of Systemssee HPI     Objective:   Physical Exam  Constitutional: She appears well-developed and well-nourished.       tearful  Cardiovascular: Normal rate and regular rhythm.   No murmur heard. Pulmonary/Chest: Effort normal and breath sounds normal. No respiratory distress.  Abdominal: Soft. Bowel sounds are normal. She exhibits no distension. There is no tenderness.          Assessment & Plan:

## 2011-01-22 NOTE — Assessment & Plan Note (Signed)
Well controlled today despite not starting cozaar which is not typical.  Gave patient information another paper prescription to take to [facility] to see if they can order this for her or another arb as she would benefit for her CKD.

## 2011-01-22 NOTE — Patient Instructions (Signed)
Use acetaminophen (Extra strength tylenol arthritis) for your knee pain Call Dr. Gwenlyn Saran to set up counseling appt to talk about stress [phone] Make sure you take your paroxetine every day Follow-up in 2 months or sooner if needed.

## 2011-01-28 ENCOUNTER — Ambulatory Visit (HOSPITAL_BASED_OUTPATIENT_CLINIC_OR_DEPARTMENT_OTHER)
Admission: RE | Admit: 2011-01-28 | Discharge: 2011-01-28 | Disposition: A | Discharge status: Home / Self Care | Payer: Self-pay | Source: Ambulatory Visit | Attending: General Surgery | Admitting: General Surgery

## 2011-01-28 ENCOUNTER — Other Ambulatory Visit: Payer: Self-pay | Admitting: General Surgery

## 2011-01-28 DIAGNOSIS — R229 Localized swelling, mass and lump, unspecified: Secondary | ICD-10-CM | POA: Insufficient documentation

## 2011-01-28 DIAGNOSIS — Z01812 Encounter for preprocedural laboratory examination: Secondary | ICD-10-CM | POA: Insufficient documentation

## 2011-01-28 DIAGNOSIS — D1739 Benign lipomatous neoplasm of skin and subcutaneous tissue of other sites: Secondary | ICD-10-CM | POA: Insufficient documentation

## 2011-01-28 HISTORY — PX: LIPOMA EXCISION: SHX5283

## 2011-01-28 LAB — BASIC METABOLIC PANEL
BUN: 19 mg/dL (ref 6–23)
CO2: 29 mEq/L (ref 19–32)
Chloride: 99 mEq/L (ref 96–112)
Creatinine, Ser: 1.25 mg/dL — ABNORMAL HIGH (ref 0.4–1.2)
GFR calc Af Amer: 53 mL/min — ABNORMAL LOW (ref >60)
Glucose, Bld: 104 mg/dL — ABNORMAL HIGH (ref 70–99)

## 2011-01-28 LAB — DIFFERENTIAL
Basophils Relative: 1 % (ref 0–1)
Lymphs Abs: 1.8 10*3/uL (ref 0.7–4.0)
Monocytes Absolute: 0.8 10*3/uL (ref 0.1–1.0)
Monocytes Relative: 12 % (ref 3–12)
Neutro Abs: 4 10*3/uL (ref 1.7–7.7)

## 2011-01-28 LAB — CBC
HCT: 36.6 % (ref 36.0–46.0)
Hemoglobin: 12.2 g/dL (ref 12.0–15.0)
MCH: 25.7 pg — ABNORMAL LOW (ref 26.0–34.0)
MCHC: 33.3 g/dL (ref 30.0–36.0)
MCV: 77.1 fL — ABNORMAL LOW (ref 78.0–100.0)
RBC: 4.75 MIL/uL (ref 3.87–5.11)

## 2011-01-28 LAB — RAPID URINE DRUG SCREEN, HOSP PERFORMED
Amphetamines: NOT DETECTED
Barbiturates: NOT DETECTED
Benzodiazepines: NOT DETECTED
Tetrahydrocannabinol: NOT DETECTED

## 2011-01-29 NOTE — Discharge Summary (Signed)
NAME:  Jennifer Foster, Jennifer Foster NO.:  [id number]  MEDICAL RECORD NO.:  [id number]           PATIENT TYPE:  I  LOCATION:  [id number]                         FACILITY:  [facility]  PHYSICIAN:  Blane Ohara Bellatrix Devonshire, M.D.DATE OF BIRTH:  [birth year]  DATE OF ADMISSION:  01/16/2011 DATE OF DISCHARGE:  01/17/2011                              DISCHARGE SUMMARY   PRIMARY CARE PROVIDER:  Suzanna Obey, MD at [facility].  REASON FOR HOSPITALIZATION:  Angioedema secondary to ACE inhibitor.  DISCHARGE DIAGNOSIS:  Angioedema secondary to ACE inhibitor.  MEDICATIONS:  New medications: 1. Losartan 50 mg p.o. daily. 2. Diphenhydramine 25 mg p.o. q.4 h p.r.n. tingling or swelling. 3. Simvastatin 20 mg p.o. daily.  Continue home medications: 1. Cyclobenzaprine 5 mg p.o. t.i.d. p.r.n. shoulder pain. 2. HCTZ 25 mg p.o. q.a.m. 3. Amlodipine 5 mg p.o. q.a.m. 4. Percocet 5/325 one to two tablets p.o. q.6 h p.r.n. pain. 5. Paxil 20 mg p.o. q.a.m.  Discontinued medications:  Lisinopril 20 mg p.o. q.a.m.  PROCEDURES:  None.  PERTINENT LABORATORY DATA:  Anemia panel normal.  UDS negative.  Ethanol level elevated at 47.  White blood count 9.2, hemoglobin 12.2, and platelets 264.  BRIEF HOSPITAL COURSE:  This is a 65 year old female presenting with angioedema secondary to lisinopril use. 1. Angioedema secondary to lisinopril use.  The patient presented to     the emergency room with tongue tingling and tongue swelling.  The     patient received IV steroids and Benadryl in the emergency room and     by the time the family medicine teaching service team saw the     patient, the angioedema was resolving.  The angioedema resolved     completely by hospital day #2 with q.6 h scheduled Benadryl.  The     patient was told not to take her lisinopril at the time of     discharge. 2. Hypertension.  The patient's lisinopril was discontinued on     admission secondary to allergic reaction.   The patient's blood     pressures were elevated during her hospitalization from the 160s-     180s/70s-80s only on her home Norvasc and HCTZ.  Her beta-blocker     was held secondary to her recent cocaine use.  UDS was positive for     cocaine.  So the patient was started on Cozaar 50 mg daily since     cross-reaction between ARB and an ACE inhibitor was rare.  However,     the patient was given a number of the resident on-call for the     weekend and given a prescription for Benadryl to take if she     develops any tongue tingling or other signs of angioedema.  The     patient has a followup appointment with her primary care provider     the following week.  DISPOSITION:  The patient was discharged home in stable medical condition.  FOLLOWUP INSTRUCTIONS: 1. Discontinue lisinopril. 2. Call clinic or page resident on-call if you have any tongue     tingling, swelling, or other  allergic reaction to the Cozaar.  FOLLOWUP ISSUES:  PCP, please follow up on the patient's blood pressure on the Norvasc HCTZ and Cozaar.  Her beta-blocker was held secondary to her recent cocaine use.    ______________________________ Karen Kays, MD   ______________________________ Blane Ohara Shakeira Rhee, M.D.    AO/MEDQ  D:  01/18/2011  T:  01/19/2011  Job:  [id number]  cc:   Suzanna Obey, MD  Electronically Signed by Karen Kays MD on 01/20/2011 11:29:31 AM Electronically Signed by Lissa Morales M.D. on 01/29/2011 08:42:42 AM

## 2011-02-03 ENCOUNTER — Encounter (INDEPENDENT_AMBULATORY_CARE_PROVIDER_SITE_OTHER): Payer: Self-pay | Admitting: General Surgery

## 2011-02-12 ENCOUNTER — Other Ambulatory Visit: Payer: Self-pay | Admitting: Family Medicine

## 2011-02-12 DIAGNOSIS — F411 Generalized anxiety disorder: Secondary | ICD-10-CM

## 2011-02-12 DIAGNOSIS — I10 Essential (primary) hypertension: Secondary | ICD-10-CM

## 2011-02-12 NOTE — Telephone Encounter (Signed)
Wants refills to be called into [facility] instead of [facility] because Suzie Portela is too expensive.  Needs BP medication and the one for depression.  Is almost out of the medications and needs them refilled as soon as possible.

## 2011-02-13 MED ORDER — HYDROCHLOROTHIAZIDE 25 MG PO TABS: 25.0000 mg | ORAL_TABLET | Freq: Every day | ORAL | Status: DC

## 2011-02-13 MED ORDER — PAROXETINE HCL 20 MG PO TABS: 20.0000 mg | ORAL_TABLET | Freq: Every day | ORAL | Status: DC

## 2011-02-13 MED ORDER — AMLODIPINE BESYLATE 5 MG PO TABS: 5.0000 mg | ORAL_TABLET | Freq: Every day | ORAL | Status: DC

## 2011-02-13 MED ORDER — LOSARTAN POTASSIUM 50 MG PO TABS: 50.0000 mg | ORAL_TABLET | Freq: Every day | ORAL | Status: DC

## 2011-02-13 NOTE — Telephone Encounter (Signed)
Jennifer Foster,  If you would please let me know which prescriptions to call in I will do that. [name]

## 2011-02-13 NOTE — Telephone Encounter (Addendum)
Please tell her:  Her depression medicine (paroexetine) is only $4 a month at [facility] or $9 for 3 months at [facility] so that should stay there. (it is $5 at [facility]pt).  i refilled it   This is also the same price for her blood pressure medications amlodipine and HCTZ at [facility].    i refilled those  Please fax her cozaar to the health department.

## 2011-02-14 ENCOUNTER — Other Ambulatory Visit: Payer: Self-pay | Admitting: Family Medicine

## 2011-02-14 DIAGNOSIS — Z1231 Encounter for screening mammogram for malignant neoplasm of breast: Secondary | ICD-10-CM

## 2011-03-06 NOTE — Op Note (Signed)
Jennifer Foster, EKE NO.:  [id number]  MEDICAL RECORD NO.:  [id number]           PATIENT TYPE:  LOCATION:                                 FACILITY:  PHYSICIAN:  Leighton Ruff. Redmond Pulling, MD     DATE OF BIRTH:  03-10-1946  DATE OF PROCEDURE:  01/28/2011 DATE OF DISCHARGE:                              OPERATIVE REPORT   PREOPERATIVE DIAGNOSIS:  Right posterior neck subcutaneous mass.  POSTOPERATIVE DIAGNOSIS:  Right posterior neck lipoma.  PROCEDURE:  Excision of right posterior neck lipoma with a two-layer closure.  SURGEON:  Leighton Ruff. Redmond Pulling, MD  ANESTHESIA:  Monitored anesthesia care plus 30 cc of 0.25% Marcaine with epinephrine.  FINDINGS:  The subcutaneous mass was consistent with lipoma, it was more oblong.  SPECIMEN:  Right posterior neck subcutaneous mass.  ESTIMATED BLOOD LOSS:  Minimal.  INDICATIONS FOR PROCEDURE:  The patient is a 65 year old African American female who has had a bump at the base of her right neck for over a year.  She states that it will change in size, most of the time it is being getting larger.  She also complains of neck pain as well as bilateral upper extremity shoulder pain.  On physical exam, it was consistent with lipoma.  We discussed the risks and benefits of surgery including but not limited to bleeding, infection, hematoma formation, seroma formation, possible malignancy on the rare exception, scarring, failure to ameliorate her neck and shoulder pain as well as a typical postoperative recovery course.  DESCRIPTION OF PROCEDURE:  The area of concern was marked in the holding area with the patient confirming the operative site.  She was then taken to the operating room and placed supine on the operating room table. Monitored anesthesia care was established. She was then placed on her left side down the right side up.  Sequential compression devices had been placed.  The base of her right neck was prepped and draped in  usual standard surgical fashion with ChloraPrep.  She received Ancef prior to skin incision.  A surgical time-out was performed.  I palpated the area of concern which was at the base of her right neck just inferior to the hairline.  The area of concern was more up along and I outlined it with a marking pen.  I then drew a linear mark over the area and then infiltrated local in the skin and deep dermis.  I then made a transverse 2.5 cm skin incision with #15 blade, deep dermis was divided with electrocautery.  I then placed additional local in a regional manner.  I found the area of concern which was essentially lipoma.  Allis was placed on the lesion.  I then circumferentially dissected away from the surrounding tissue with electrocautery.  The lesion was freed.  It was passed off the field.  Hemostasis was achieved with electrocautery.  The wound was irrigated.  The deep dermis was then reapproximated with inverted interrupted 3-0 Vicryls and the skin was reapproximated with a running 4-0 Monocryl.  A total skin incision length was around 3 cm.  Dermabond was applied.  All needle, instrument, sponge counts were correct x2.  There were no immediate complications.  The patient tolerated the procedure well.  She will be going home later today and follow with me in 2 weeks.     Leighton Ruff. Redmond Pulling, MD     EMW/MEDQ  D:  01/28/2011  T:  01/28/2011  Job:  [id number]  cc:   [facility] Fax: [phone]  Electronically Signed by Greer Pickerel M.D. on 03/06/2011 12:01:08 PM

## 2011-03-19 ENCOUNTER — Ambulatory Visit (INDEPENDENT_AMBULATORY_CARE_PROVIDER_SITE_OTHER): Payer: PRIVATE HEALTH INSURANCE | Admitting: General Surgery

## 2011-03-19 ENCOUNTER — Encounter (INDEPENDENT_AMBULATORY_CARE_PROVIDER_SITE_OTHER): Payer: Self-pay | Admitting: General Surgery

## 2011-03-19 DIAGNOSIS — Z09 Encounter for follow-up examination after completed treatment for conditions other than malignant neoplasm: Secondary | ICD-10-CM

## 2011-03-19 DIAGNOSIS — D170 Benign lipomatous neoplasm of skin and subcutaneous tissue of head, face and neck: Secondary | ICD-10-CM

## 2011-03-19 DIAGNOSIS — D1739 Benign lipomatous neoplasm of skin and subcutaneous tissue of other sites: Secondary | ICD-10-CM

## 2011-03-19 DIAGNOSIS — D179 Benign lipomatous neoplasm, unspecified: Secondary | ICD-10-CM | POA: Insufficient documentation

## 2011-03-19 NOTE — Patient Instructions (Signed)
Call our office when you would like to schedule removal of your right chest wall lipoma

## 2011-03-19 NOTE — Progress Notes (Signed)
Procedure: Excision of right posterior neck lipoma with a 2 layer closure Jan 28, 2011  History of Present Ilness: 65 year old African female comes in for her second postoperative visit. She was last seen February 05, 2011. Since then she's been doing well. She denies any problems with her neck incision. She denies any drainage from the incision. She denies any fevers or chills. She would like to wait before scheduling excision of a right chest wall lipoma  Physical Exam: Well-developed well-nourished African American female in no apparent distress Skin-well healed right posterior neck transverse incision. No cellulitis or induration. No palpable hematoma. Right chest wall subcutaneous mass is unchanged. It is about 1.5 cm x 1.5 cm slightly to the right of her sternum.  Pathology: Reviewed with the patient. Her neck mass was consistent with a benign lipoma.  Assessment and Plan: Status post excision of right posterior neck lipoma with a 2 layer closure on Jan 28, 2011. Right chest wall lipoma. I've asked her to call the office when she would like to schedule removal of her right chest wall lipoma. Until that time we will see her on an as needed basis.

## 2011-03-20 ENCOUNTER — Other Ambulatory Visit: Payer: Self-pay | Admitting: Family Medicine

## 2011-03-20 DIAGNOSIS — I10 Essential (primary) hypertension: Secondary | ICD-10-CM

## 2011-03-20 MED ORDER — HYDROCHLOROTHIAZIDE 25 MG PO TABS: 25.0000 mg | ORAL_TABLET | Freq: Every day | ORAL | Status: DC

## 2011-03-24 ENCOUNTER — Ambulatory Visit (HOSPITAL_COMMUNITY): Payer: Self-pay

## 2011-05-20 ENCOUNTER — Ambulatory Visit (INDEPENDENT_AMBULATORY_CARE_PROVIDER_SITE_OTHER): Payer: Self-pay | Admitting: Family Medicine

## 2011-05-20 DIAGNOSIS — I10 Essential (primary) hypertension: Secondary | ICD-10-CM

## 2011-05-20 NOTE — Progress Notes (Signed)
  Subjective:    Patient ID: Jennifer Foster, female    DOB: 1946-06-28, 65 y.o.   MRN: [id number]  HPI EMR not accessible during this visit due to network outage.  Patient here to discuss Hypertension and referral to nephrology.  HYPERTENSION  BP Readings from Last 3 Encounters:  05/20/11 177/89  01/22/11 122/76  12/30/10 163/85    Hypertension ROS: taking medications as instructed, no medication side effects noted, patient does not perform home BP monitoring, no TIA's, no chest pain on exertion, no dyspnea on exertion, no swelling of ankles and no intermittent claudication symptoms.   Bring in bottles today- all empty- amlodipine and paxil are missing  CKD:  States she had someone come to her house and do blood work.  They told her to see a kidney doctor.  She would like to do this but states she would never consider dialysis.  She meant to bring paperwork but did not and has no further details.    Review of Systems see above     Objective:   Physical Exam  GEN: Alert & Oriented, No acute distress CV:  Regular Rate & Rhythm, no murmur Respiratory:  Normal work of breathing, CTAB Abd:  + BS, soft, no tenderness to palpation Ext: no pre-tibial edema       Assessment & Plan:

## 2011-05-20 NOTE — Assessment & Plan Note (Signed)
She is interested in referral.  Will have her follow up in 2 weeks for cr recheck, and will  Plan to refer at that time when we have full access to EMR and lab results.

## 2011-05-20 NOTE — Assessment & Plan Note (Signed)
Poorly controlled, educated abotu salt intake, source of high salt and effect on kidneys.  States she has been compliant with meds, will increase losartan to 50 BID from once a day, will come back for ov in 2 weeks and will check Cr at that time.

## 2011-05-22 ENCOUNTER — Ambulatory Visit
Admission: RE | Admit: 2011-05-22 | Discharge: 2011-05-22 | Disposition: A | Discharge status: Home / Self Care | Payer: Self-pay | Source: Ambulatory Visit | Attending: Family Medicine | Admitting: Family Medicine

## 2011-05-22 DIAGNOSIS — Z1231 Encounter for screening mammogram for malignant neoplasm of breast: Secondary | ICD-10-CM

## 2011-05-22 LAB — CULTURE, BLOOD (ROUTINE X 2)
Culture: NO GROWTH
Culture: NO GROWTH

## 2011-05-22 LAB — TYPE AND SCREEN: ABO/RH(D): A POS

## 2011-05-22 LAB — DIFFERENTIAL
Basophils Absolute: 0
Basophils Absolute: 0
Eosinophils Absolute: 0
Lymphocytes Relative: 20
Lymphocytes Relative: 8 — ABNORMAL LOW
Monocytes Absolute: 0.4
Monocytes Absolute: 0.6
Neutro Abs: 5
Neutrophils Relative %: 68

## 2011-05-22 LAB — CBC
HCT: 29.5 — ABNORMAL LOW
MCHC: 34.9
MCHC: 35
MCV: 80.5
Platelets: 171
RDW: 14
RDW: 14.1
WBC: 5.9

## 2011-05-22 LAB — PROTIME-INR: INR: 1

## 2011-05-22 LAB — COMPREHENSIVE METABOLIC PANEL
Albumin: 2.9 — ABNORMAL LOW
BUN: 14
Chloride: 101
Creatinine, Ser: 1.67 — ABNORMAL HIGH
Total Bilirubin: 0.7

## 2011-05-22 LAB — LIPASE, BLOOD: Lipase: 12

## 2011-05-23 LAB — DIFFERENTIAL
Basophils Absolute: 0
Eosinophils Absolute: 0.1
Eosinophils Relative: 2

## 2011-05-23 LAB — BASIC METABOLIC PANEL
BUN: 15
BUN: 7
CO2: 27
Calcium: 7.9 — ABNORMAL LOW
Chloride: 109
Chloride: 112
Creatinine, Ser: 1.1
GFR calc non Af Amer: 35 — ABNORMAL LOW
GFR calc non Af Amer: 45 — ABNORMAL LOW
GFR calc non Af Amer: 51 — ABNORMAL LOW
Glucose, Bld: 71
Glucose, Bld: 80
Glucose, Bld: 86
Potassium: 3.7
Potassium: 3.8
Potassium: 4.6
Sodium: 140
Sodium: 142

## 2011-05-23 LAB — CBC
HCT: 25.4 — ABNORMAL LOW
HCT: 26 — ABNORMAL LOW
HCT: 26 — ABNORMAL LOW
Hemoglobin: 8.9 — ABNORMAL LOW
Hemoglobin: 9.1 — ABNORMAL LOW
MCHC: 35
MCV: 83
MCV: 84.2
Platelets: 136 — ABNORMAL LOW
Platelets: 145 — ABNORMAL LOW
RDW: 14.1
RDW: 14.3
RDW: 14.7

## 2011-05-23 LAB — HEMOGLOBIN AND HEMATOCRIT, BLOOD
HCT: 24.2 — ABNORMAL LOW
HCT: 24.9 — ABNORMAL LOW
HCT: 26.4 — ABNORMAL LOW
Hemoglobin: 8.3 — ABNORMAL LOW
Hemoglobin: 9 — ABNORMAL LOW
Hemoglobin: 9.1 — ABNORMAL LOW

## 2011-05-23 LAB — PREPARE RBC (CROSSMATCH)

## 2011-05-23 LAB — URINE MICROSCOPIC-ADD ON

## 2011-05-23 LAB — URINALYSIS, ROUTINE W REFLEX MICROSCOPIC
Glucose, UA: NEGATIVE
Ketones, ur: 15 — AB
Protein, ur: 100 — AB
Urobilinogen, UA: 1

## 2011-05-23 LAB — URINE CULTURE: Colony Count: 60000

## 2011-05-26 ENCOUNTER — Encounter: Payer: Self-pay | Admitting: Family Medicine

## 2011-05-26 MED ORDER — ATORVASTATIN CALCIUM 40 MG PO TABS: 40.0000 mg | ORAL_TABLET | Freq: Every day | ORAL | Status: DC

## 2011-05-26 MED ORDER — VALSARTAN-HYDROCHLOROTHIAZIDE 160-12.5 MG PO TABS: ORAL_TABLET | ORAL | Status: DC

## 2011-05-26 NOTE — Progress Notes (Signed)
  Subjective:    Patient ID: Jennifer Foster, female    DOB: 10-07-1945, 65 y.o.   MRN: [id number]  HPI  MAP Program medication review.  Changed to losaratan to diovan hCT, d/c losartan and hctz Changed pravastatin to lipitor  Review of Systems     Objective:   Physical Exam        Assessment & Plan:

## 2011-06-13 ENCOUNTER — Ambulatory Visit: Payer: Self-pay | Admitting: Family Medicine

## 2011-06-18 ENCOUNTER — Ambulatory Visit: Payer: Self-pay | Admitting: Family Medicine

## 2011-06-23 ENCOUNTER — Other Ambulatory Visit: Payer: Self-pay | Admitting: Family Medicine

## 2011-06-23 DIAGNOSIS — I10 Essential (primary) hypertension: Secondary | ICD-10-CM

## 2011-06-23 MED ORDER — AMLODIPINE BESYLATE 5 MG PO TABS: 5.0000 mg | ORAL_TABLET | Freq: Every day | ORAL | Status: DC

## 2011-06-25 ENCOUNTER — Ambulatory Visit: Payer: Self-pay | Admitting: Family Medicine

## 2011-06-25 ENCOUNTER — Telehealth: Payer: Self-pay | Admitting: *Deleted

## 2011-06-25 NOTE — Telephone Encounter (Signed)
Called pt. Advised to r/s appt with Dr.Briscoe.  Jennifer Foster, Jennifer Foster P.s. Cancelled appt for today.Jennifer Foster

## 2011-07-16 ENCOUNTER — Encounter: Payer: Self-pay | Admitting: Family Medicine

## 2011-07-16 ENCOUNTER — Ambulatory Visit (INDEPENDENT_AMBULATORY_CARE_PROVIDER_SITE_OTHER): Payer: Self-pay | Admitting: Family Medicine

## 2011-07-16 VITALS — BP 153/88 | HR 78 | Temp 98.3°F | Ht 64.0 in | Wt 170.0 lb

## 2011-07-16 DIAGNOSIS — E785 Hyperlipidemia, unspecified: Secondary | ICD-10-CM

## 2011-07-16 DIAGNOSIS — F411 Generalized anxiety disorder: Secondary | ICD-10-CM

## 2011-07-16 DIAGNOSIS — D649 Anemia, unspecified: Secondary | ICD-10-CM

## 2011-07-16 DIAGNOSIS — R7301 Impaired fasting glucose: Secondary | ICD-10-CM

## 2011-07-16 DIAGNOSIS — I10 Essential (primary) hypertension: Secondary | ICD-10-CM

## 2011-07-16 DIAGNOSIS — Z23 Encounter for immunization: Secondary | ICD-10-CM

## 2011-07-16 LAB — CBC WITH DIFFERENTIAL/PLATELET
Basophils Absolute: 0 10*3/uL (ref 0.0–0.1)
Basophils Relative: 0 % (ref 0–1)
Eosinophils Relative: 1 % (ref 0–5)
HCT: 37.8 % (ref 36.0–46.0)
Lymphocytes Relative: 30 % (ref 12–46)
MCHC: 33.9 g/dL (ref 30.0–36.0)
MCV: 79.9 fL (ref 78.0–100.0)
Monocytes Absolute: 0.8 10*3/uL (ref 0.1–1.0)
RDW: 15.5 % (ref 11.5–15.5)

## 2011-07-16 LAB — COMPREHENSIVE METABOLIC PANEL
ALT: 14 U/L (ref 0–35)
AST: 16 U/L (ref 0–37)
Alkaline Phosphatase: 61 U/L (ref 39–117)
Sodium: 141 mEq/L (ref 135–145)
Total Bilirubin: 0.7 mg/dL (ref 0.3–1.2)
Total Protein: 7.2 g/dL (ref 6.0–8.3)

## 2011-07-16 MED ORDER — HYDROXYZINE HCL 25 MG PO TABS: 25.0000 mg | ORAL_TABLET | Freq: Every evening | ORAL | Status: AC | PRN

## 2011-07-16 MED ORDER — PAROXETINE HCL 20 MG PO TABS: 20.0000 mg | ORAL_TABLET | Freq: Every day | ORAL | Status: DC

## 2011-07-16 MED ORDER — HYDROXYZINE HCL 25 MG PO TABS: 25.0000 mg | ORAL_TABLET | Freq: Every evening | ORAL | Status: DC | PRN

## 2011-07-16 NOTE — Patient Instructions (Signed)
I am sorry you have been through so much sadness!  Call Dr. Gwenlyn Saran to set up an appointment for therapy.  She is very good at helping people deal with stress in their lives.  Restart Paroxetine for anxiety/Stress- prescription  [facility]  Hydroxyzine- can take at night for anxiety- prescription at [facility] blood pressure log and bring to next appointment  Follow-up in 1 month

## 2011-07-16 NOTE — Assessment & Plan Note (Signed)
Will recheck creatinine today. Patient has significant proteinuria in and is on maximum dose of are. We have been discussing possible referral to nephrology. Given her high stress levels will defer this at this time. We'll followup with patient in one month
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
## 2011-07-16 NOTE — Assessment & Plan Note (Signed)
Will restart paroxetine today. Will prescribe hydroxyzine when necessary at night to help with sleep given recent unsettling of the hands.  I have recommended that she contact Dr. Gwenlyn Saran for therapy. She has a long history of difficulty dealing with anxiety related to family members as well as her chronic medical issues and has limited capacity for dealing with stress in her life.   I think she would greatly benefit from therapy to improve coping skills as well as working on locus of control on being able to control the outcome of her chronic medical conditions.

## 2011-07-16 NOTE — Assessment & Plan Note (Signed)
Will recheck her LDL today

## 2011-07-16 NOTE — Assessment & Plan Note (Signed)
Long history of noncompliance. To date as above goal but this is a large improvement from what it is typically especially taking into account her stress her last night as well as the fact that she has not taken blood pressure medication this morning.  I will not make any changes today. We'll followup in one month

## 2011-07-17 ENCOUNTER — Encounter: Payer: Self-pay | Admitting: Family Medicine

## 2011-07-17 DIAGNOSIS — R7301 Impaired fasting glucose: Secondary | ICD-10-CM | POA: Insufficient documentation

## 2011-09-08 ENCOUNTER — Ambulatory Visit (INDEPENDENT_AMBULATORY_CARE_PROVIDER_SITE_OTHER): Payer: Self-pay | Admitting: Family Medicine

## 2011-09-08 ENCOUNTER — Encounter: Payer: Self-pay | Admitting: Family Medicine

## 2011-09-08 VITALS — BP 144/86 | Temp 98.4°F | Ht 64.0 in | Wt 178.0 lb

## 2011-09-08 DIAGNOSIS — I10 Essential (primary) hypertension: Secondary | ICD-10-CM

## 2011-09-08 DIAGNOSIS — Q849 Congenital malformation of integument, unspecified: Secondary | ICD-10-CM

## 2011-09-08 DIAGNOSIS — F411 Generalized anxiety disorder: Secondary | ICD-10-CM

## 2011-09-08 DIAGNOSIS — M898X9 Other specified disorders of bone, unspecified site: Secondary | ICD-10-CM

## 2011-09-08 DIAGNOSIS — L989 Disorder of the skin and subcutaneous tissue, unspecified: Secondary | ICD-10-CM

## 2011-09-08 DIAGNOSIS — M2570 Osteophyte, unspecified joint: Secondary | ICD-10-CM

## 2011-09-08 MED ORDER — PAROXETINE HCL 20 MG PO TABS: 20.0000 mg | ORAL_TABLET | Freq: Every day | ORAL | Status: DC

## 2011-09-08 NOTE — Patient Instructions (Signed)
Your blood pressure looks much better today.  Your blood pressure goal is less than 130/80.  Write down your home blood pressures and bring them to your next appointment in 3 months.  Keep bringing your medicines to each appointment.  I think the bump on your collar bone is normal bone- let me know if it gets larger or hurts  Avoid drinking bouillon it is very salty- try herbal caffeine free tea instea

## 2011-09-08 NOTE — Assessment & Plan Note (Signed)
Mild prominence of right medial clavicle.  No history of trauma.  Advised no further needs at this time, return if changes or becomes tender.
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
## 2011-09-08 NOTE — Assessment & Plan Note (Signed)
Slightly above goal today, but this is the best it has been.  Recommended continued focus on compliance and substituting hot caffeine free teas for boullion cubes in water.  Follow-up in 78months

## 2011-09-08 NOTE — Assessment & Plan Note (Addendum)
Overall improved from last time but still significantly affected.  Gave her rx for paroxetine to restart.

## 2011-12-04 ENCOUNTER — Other Ambulatory Visit: Payer: Self-pay | Admitting: Family Medicine

## 2011-12-04 DIAGNOSIS — I10 Essential (primary) hypertension: Secondary | ICD-10-CM

## 2011-12-04 MED ORDER — AMLODIPINE BESYLATE 5 MG PO TABS: 5.0000 mg | ORAL_TABLET | Freq: Every day | ORAL | Status: DC

## 2012-02-10 ENCOUNTER — Ambulatory Visit (INDEPENDENT_AMBULATORY_CARE_PROVIDER_SITE_OTHER): Payer: Self-pay | Admitting: Family Medicine

## 2012-02-10 ENCOUNTER — Encounter: Payer: Self-pay | Admitting: Family Medicine

## 2012-02-10 VITALS — BP 156/80 | HR 77 | Ht 64.0 in | Wt 186.6 lb

## 2012-02-10 DIAGNOSIS — R635 Abnormal weight gain: Secondary | ICD-10-CM

## 2012-02-10 DIAGNOSIS — F17200 Nicotine dependence, unspecified, uncomplicated: Secondary | ICD-10-CM

## 2012-02-10 DIAGNOSIS — E785 Hyperlipidemia, unspecified: Secondary | ICD-10-CM

## 2012-02-10 DIAGNOSIS — F411 Generalized anxiety disorder: Secondary | ICD-10-CM

## 2012-02-10 DIAGNOSIS — N183 Chronic kidney disease, stage 3 unspecified: Secondary | ICD-10-CM

## 2012-02-10 DIAGNOSIS — I10 Essential (primary) hypertension: Secondary | ICD-10-CM

## 2012-02-10 LAB — BASIC METABOLIC PANEL
BUN: 29 mg/dL — ABNORMAL HIGH (ref 6–23)
Glucose, Bld: 112 mg/dL — ABNORMAL HIGH (ref 70–99)
Potassium: 3.5 mEq/L (ref 3.5–5.3)

## 2012-02-10 LAB — LIPID PANEL
Cholesterol: 129 mg/dL (ref 0–200)
LDL Cholesterol: 47 mg/dL (ref 0–99)
VLDL: 48 mg/dL — ABNORMAL HIGH (ref 0–40)

## 2012-02-10 NOTE — Assessment & Plan Note (Signed)
Has not taken meds  this morning.  I suspect she needs increased medications.  We discuss and decided she will come back in 1-2 weeks, take her medications more consistently and bring them into that visit and we will decide based on those results.

## 2012-02-10 NOTE — Assessment & Plan Note (Signed)
Will check Cr today

## 2012-02-10 NOTE — Assessment & Plan Note (Signed)
Encouraged her to take daily to avoid side effects of transient anxiety when restarting

## 2012-02-10 NOTE — Progress Notes (Signed)
  Subjective:    Patient ID: Jennifer Foster, female    DOB: April 27, 1946, 66 y.o.   MRN: [id number]  HPI  HYPERTENSION  BP Readings from Last 3 Encounters:  02/10/12 156/80  09/08/11 144/86  07/16/11 153/88    Hypertension ROS: taking medications as instructed, no medication side effects noted, no chest pain on exertion, no dyspnea on exertion and no swelling of ankles.    HYPERLIPIDEMIA  Diet: Not following low cholesterol diet Exercise: No regular exercise Wt Readings from Last 3 Encounters:  02/10/12 186 lb 9.6 oz (84.641 kg)  09/08/11 178 lb (80.74 kg)  07/16/11 170 lb (77.111 kg)   ROS:  Denies RUQ pain, myalgias, or symptoms or coronary ischemia Lab Results  Component Value Date   LDLCALC 123* 12/30/2010   Lab Results  Component Value Date   CHOL 192 12/30/2010   CHOL 182 12/13/2008   Lab Results  Component Value Date   HDL 44 12/30/2010   HDL 50 12/13/2008   Lab Results  Component Value Date   TRIG 126 12/30/2010   TRIG 112 12/13/2008   Lab Results  Component Value Date   ALT 14 07/16/2011   AST 16 07/16/2011   ALKPHOS 61 07/16/2011   BILITOT 0.7 07/16/2011    Chronic Kidney Disease: no OTC NSAIDS, no urinary hesitancy, frequency  Weight gain:  Notes she has gained weight sinc ethe last visit.  Denies fatigue.  Feels she has been snacking a lot.  Depression:  Has stopped paroxetine again.  Although it makes her feel better, when she is off and on, she feels anxious.  Feels mood is doing well right now.  Tobacco: 1-3 cigarettes a day, thinking about quitting totally.   Review of Systems    see HPI Objective:   Physical Exam GEN: Alert & Oriented, No acute distress CV:  Regular Rate & Rhythm, no murmur Respiratory:  Normal work of breathing, CTAB Abd:  + BS, soft, no tenderness to palpation Ext: no pre-tibial edema        Assessment & Plan:

## 2012-02-10 NOTE — Assessment & Plan Note (Signed)
Only smoking 1-3 cigarettes.  Thinking about quitting.    Stress a trigger.  Discussed some methods that have worked for her in the past such a snacking (but now substitute sugar free candy) or crocheting.

## 2012-02-10 NOTE — Patient Instructions (Signed)
Your blood pressure is high today I would like your blood pressure to be less than 130/80  Make appointment to recheck your blood pressure in 1-2 weeks.  Take your medicines before coming, and bring your medicines for a check.  Try sugar free candies or gum instead of snacking while you work to quit smoking.  You mentioned crocheting helps you as well

## 2012-02-10 NOTE — Assessment & Plan Note (Signed)
Will check FLP today

## 2012-02-23 ENCOUNTER — Ambulatory Visit: Payer: Self-pay | Admitting: Family Medicine

## 2012-04-05 ENCOUNTER — Ambulatory Visit (INDEPENDENT_AMBULATORY_CARE_PROVIDER_SITE_OTHER): Payer: Self-pay | Admitting: Family Medicine

## 2012-04-05 ENCOUNTER — Encounter: Payer: Self-pay | Admitting: Family Medicine

## 2012-04-05 VITALS — BP 143/79 | HR 70 | Ht 64.0 in | Wt 185.0 lb

## 2012-04-05 DIAGNOSIS — F411 Generalized anxiety disorder: Secondary | ICD-10-CM

## 2012-04-05 DIAGNOSIS — E785 Hyperlipidemia, unspecified: Secondary | ICD-10-CM

## 2012-04-05 DIAGNOSIS — K089 Disorder of teeth and supporting structures, unspecified: Secondary | ICD-10-CM | POA: Insufficient documentation

## 2012-04-05 DIAGNOSIS — I10 Essential (primary) hypertension: Secondary | ICD-10-CM

## 2012-04-05 MED ORDER — AMLODIPINE BESYLATE 10 MG PO TABS: 10.0000 mg | ORAL_TABLET | Freq: Every day | ORAL | Status: DC

## 2012-04-05 NOTE — Progress Notes (Signed)
  Subjective:    Patient ID: Jennifer Foster, female    DOB: 1946/02/07, 66 y.o.   MRN: [id number]  HPI Here for follow-up of chronic medical conditions  HYPERTENSION- history of poorly controlled hypertension.  Brings in her meds today. Has been compliant.  Blood pressure reflects that.  Still above goal.  BP Readings from Last 3 Encounters:  04/05/12 143/79  02/10/12 156/80  09/08/11 144/86    Hypertension ROS: taking medications as instructed, no medication side effects noted, no chest pain on exertion, no dyspnea on exertion and no swelling of ankles.    HYPERLIPIDEMIA  Diet: Not following low cholesterol diet Exercise: No regular exercise Wt Readings from Last 3 Encounters:  04/05/12 185 lb (83.915 kg)  02/10/12 186 lb 9.6 oz (84.641 kg)  09/08/11 178 lb (80.74 kg)   ROS:  Denies RUQ pain, myalgias, or symptoms or coronary ischemia Lab Results  Component Value Date   LDLCALC 47 02/10/2012   Lab Results  Component Value Date   CHOL 129 02/10/2012   CHOL 192 12/30/2010   CHOL 182 12/13/2008   Lab Results  Component Value Date   HDL 34* 02/10/2012   HDL 44 12/30/2010   HDL 50 12/13/2008   Lab Results  Component Value Date   TRIG 239* 02/10/2012   TRIG 126 12/30/2010   TRIG 112 12/13/2008   Lab Results  Component Value Date   ALT 14 07/16/2011   AST 16 07/16/2011   ALKPHOS 61 07/16/2011   BILITOT 0.7 07/16/2011         Review of Systems See HPI    Objective:   Physical Exam GEN: Alert & Oriented, No acute distress CV:  Regular Rate & Rhythm, no murmur Respiratory:  Normal work of breathing, CTAB Abd:  + BS, soft, no tenderness to palpation Ext: no pre-tibial edema        Assessment & Plan:

## 2012-04-05 NOTE — Patient Instructions (Addendum)
Will increase amlodipine from 5 mg to 10 mg a day  Restart [facility] for stress[facility]  I will put in paperwork for you to get on dentist list  Follow-up in 3 months

## 2012-04-05 NOTE — Assessment & Plan Note (Signed)
Poor dentition with multiple missing teeth, caries, pain.  Will make dental clinic referral

## 2012-04-05 NOTE — Assessment & Plan Note (Signed)
Encouraged her to pick up refill from pharmacy.  She does not endorse any stigma or financial limitations to picking it up.

## 2012-04-05 NOTE — Assessment & Plan Note (Signed)
Improved control.  Stil above goal with CKD.  Will increase amlodipine to 10 mg daily.

## 2012-04-05 NOTE — Addendum Note (Signed)
Addended by: Katherina Mires on: 04/05/2012 01:53 PM   Modules accepted: Orders

## 2012-04-05 NOTE — Assessment & Plan Note (Signed)
At goal  Continue pravastatin

## 2012-04-09 ENCOUNTER — Other Ambulatory Visit: Payer: Self-pay | Admitting: Family Medicine

## 2012-04-11 ENCOUNTER — Emergency Department (HOSPITAL_COMMUNITY)
Admission: EM | Admit: 2012-04-11 | Discharge: 2012-04-11 | Disposition: A | Discharge status: Home / Self Care | Payer: Self-pay | Attending: Emergency Medicine | Admitting: Emergency Medicine

## 2012-04-11 ENCOUNTER — Emergency Department (HOSPITAL_COMMUNITY): Payer: Self-pay

## 2012-04-11 ENCOUNTER — Encounter (HOSPITAL_COMMUNITY): Payer: Self-pay | Admitting: Emergency Medicine

## 2012-04-11 DIAGNOSIS — I10 Essential (primary) hypertension: Secondary | ICD-10-CM | POA: Insufficient documentation

## 2012-04-11 DIAGNOSIS — F17200 Nicotine dependence, unspecified, uncomplicated: Secondary | ICD-10-CM | POA: Insufficient documentation

## 2012-04-11 DIAGNOSIS — E785 Hyperlipidemia, unspecified: Secondary | ICD-10-CM | POA: Insufficient documentation

## 2012-04-11 DIAGNOSIS — D649 Anemia, unspecified: Secondary | ICD-10-CM | POA: Insufficient documentation

## 2012-04-11 DIAGNOSIS — J329 Chronic sinusitis, unspecified: Secondary | ICD-10-CM

## 2012-04-11 DIAGNOSIS — K219 Gastro-esophageal reflux disease without esophagitis: Secondary | ICD-10-CM | POA: Insufficient documentation

## 2012-04-11 HISTORY — DX: Unspecified asthma, uncomplicated: J45.909

## 2012-04-11 MED ORDER — AZITHROMYCIN 250 MG PO TABS: 250.0000 mg | ORAL_TABLET | Freq: Every day | ORAL | Status: AC

## 2012-04-11 NOTE — ED Provider Notes (Signed)
History     CSN: [id number]  Arrival date & time 04/11/12  [id number]   First MD Initiated Contact with Patient 04/11/12 0857      Chief Complaint  Patient presents with  . Headache  . Sore Throat  . Generalized Body Aches    (Consider location/radiation/quality/duration/timing/severity/associated sxs/prior treatment) Patient is a 66 y.o. female presenting with pharyngitis and URI. The history is provided by the patient.  Sore Throat Associated symptoms include chills, congestion, coughing, headaches, myalgias and a sore throat. Pertinent negatives include no abdominal pain, fever, nausea or vomiting.  URI The primary symptoms include headaches, sore throat, cough and myalgias. Primary symptoms do not include fever, abdominal pain, nausea or vomiting. The current episode started 3 to 5 days ago. This is a new problem.  Symptoms associated with the illness include chills, plugged ear sensation, facial pain, sinus pressure, congestion and rhinorrhea.    Past Medical History  Diagnosis Date  . Hypertension   . Joint pain   . GERD (gastroesophageal reflux disease)   . Hyperlipemia   . History of asthma   . History of anemia   . Lipoma   . Asthma     Past Surgical History  Procedure Date  . Abdominal hysterectomy 1981    partial, per pt history  . Lipoma excision 01/28/11    neck    Family History  Problem Relation Age of Onset  . Diabetes type II    . Kidney failure    . Kidney failure Son     History  Substance Use Topics  . Smoking status: Current Everyday Smoker -- 0.3 packs/day    Types: Cigarettes  . Smokeless tobacco: Not on file  . Alcohol Use: 4.2 oz/week    7 Cans of beer per week     40 oz cans    OB History    Grav Para Term Preterm Abortions TAB SAB Ect Mult Living                  Review of Systems  Constitutional: Positive for chills. Negative for fever.  HENT: Positive for congestion, sore throat, rhinorrhea and sinus pressure.   Respiratory:  Positive for cough.   Gastrointestinal: Negative for nausea, vomiting and abdominal pain.  Musculoskeletal: Positive for myalgias.  Neurological: Positive for headaches.    Allergies  Ace inhibitors and Lisinopril  Home Medications   Current Outpatient Rx  Name Route Sig Dispense Refill  . AMLODIPINE BESYLATE 10 MG PO TABS Oral Take 10 mg by mouth daily.    . ATORVASTATIN CALCIUM 40 MG PO TABS Oral Take 40 mg by mouth daily.    [name] VALSARTAN-HYDROCHLOROTHIAZIDE 160-12.5 MG PO TABS Oral Take 1 tablet by mouth daily.      BP 155/72  Pulse 83  Temp 100 F (37.8 C) (Oral)  SpO2 97%  Physical Exam  Constitutional: She appears well-developed and well-nourished.  HENT:  Head: Normocephalic.  Right Ear: External ear normal.  Left Ear: External ear normal.  Nose: Mucosal edema present. Right sinus exhibits frontal sinus tenderness. Left sinus exhibits frontal sinus tenderness.  Mouth/Throat: Oropharynx is clear and moist.  Neck: Normal range of motion. Neck supple.  Cardiovascular: Normal rate and normal heart sounds.   No murmur heard. Pulmonary/Chest: Effort normal and breath sounds normal. She has no wheezes. She has no rales.  Abdominal: Soft. Bowel sounds are normal. She exhibits no distension. There is no tenderness.  Musculoskeletal: Normal range of motion.  Lymphadenopathy:  She has no cervical adenopathy.  Skin: Skin is warm and dry. No pallor.    ED Course  Procedures (including critical care time)  Labs Reviewed - No data to display Dg Chest 2 View  04/11/2012  *RADIOLOGY REPORT*  Clinical Data: Headache.  Body aches.  Sore throat.  Generalized weakness.  Shortness of breath. 4-5 day history of these symptoms. Smoker with history of asthma.  CHEST - 2 VIEW  Comparison: Two-view chest x-ray 06/21/2010, 09/13/2006.  Findings: Cardiac silhouette moderately enlarged but stable. Thoracic aorta mildly tortuous atherosclerotic, unchanged.  Hilar and mediastinal contours  otherwise unremarkable.  Minimal linear scarring in the lingula.  Moderate central peribronchial thickening, more so than on the prior examinations.  Lungs otherwise clear. No pleural effusions.  Visualized bony thorax intact.  IMPRESSION: Moderate changes of acute bronchitis and/or asthma without localized airspace pneumonia.  Stable cardiomegaly without pulmonary edema.  Original Report Authenticated By: Deniece Portela, M.D.     No diagnosis found.  1. Sinusitis   MDM  Neg. CXR, s/sxs support sinusitis. Will treat with abx.         Leotis Shames, PA-C 04/11/12 [phone]

## 2012-04-11 NOTE — ED Provider Notes (Signed)
Medical screening examination/treatment/procedure(s) were performed by non-physician practitioner and as supervising physician I was immediately available for consultation/collaboration.  Varney Biles, MD 04/11/12 1724

## 2012-04-11 NOTE — ED Notes (Signed)
Pt c/o sore throat, severe H/A, body aches, cough which is productive (clear), shoulder pain onset this past Wednesday. Has not seen physician for this. Pt has nasal and throat congestion apparent when she speaks.

## 2012-04-13 ENCOUNTER — Other Ambulatory Visit: Payer: Self-pay | Admitting: Family Medicine

## 2012-04-13 DIAGNOSIS — Z1231 Encounter for screening mammogram for malignant neoplasm of breast: Secondary | ICD-10-CM

## 2012-04-22 ENCOUNTER — Other Ambulatory Visit: Payer: Self-pay | Admitting: Family Medicine

## 2012-04-22 MED ORDER — OLMESARTAN MEDOXOMIL-HCTZ 40-25 MG PO TABS: 1.0000 | ORAL_TABLET | Freq: Every day | ORAL | Status: DC

## 2012-05-24 ENCOUNTER — Ambulatory Visit
Admission: RE | Admit: 2012-05-24 | Discharge: 2012-05-24 | Disposition: A | Discharge status: Home / Self Care | Payer: Self-pay | Source: Ambulatory Visit | Attending: Family Medicine | Admitting: Family Medicine

## 2012-05-24 DIAGNOSIS — Z1231 Encounter for screening mammogram for malignant neoplasm of breast: Secondary | ICD-10-CM

## 2012-07-15 ENCOUNTER — Ambulatory Visit: Payer: Self-pay | Admitting: Family Medicine

## 2012-07-20 ENCOUNTER — Encounter: Payer: Self-pay | Admitting: Family Medicine

## 2012-07-20 ENCOUNTER — Ambulatory Visit (INDEPENDENT_AMBULATORY_CARE_PROVIDER_SITE_OTHER): Payer: Self-pay | Admitting: Family Medicine

## 2012-07-20 VITALS — BP 150/70 | HR 77 | Temp 98.9°F | Ht 64.0 in | Wt 186.5 lb

## 2012-07-20 DIAGNOSIS — Z23 Encounter for immunization: Secondary | ICD-10-CM

## 2012-07-20 DIAGNOSIS — M549 Dorsalgia, unspecified: Secondary | ICD-10-CM | POA: Insufficient documentation

## 2012-07-20 DIAGNOSIS — I10 Essential (primary) hypertension: Secondary | ICD-10-CM

## 2012-07-20 DIAGNOSIS — M545 Low back pain: Secondary | ICD-10-CM

## 2012-07-20 LAB — COMPREHENSIVE METABOLIC PANEL
ALT: 11 U/L (ref 0–35)
AST: 14 U/L (ref 0–37)
BUN: 20 mg/dL (ref 6–23)
Creat: 1.59 mg/dL — ABNORMAL HIGH (ref 0.50–1.10)
Total Bilirubin: 0.6 mg/dL (ref 0.3–1.2)

## 2012-07-20 MED ORDER — TRAMADOL HCL 50 MG PO TABS: 50.0000 mg | ORAL_TABLET | Freq: Four times a day (QID) | ORAL | Status: DC | PRN

## 2012-07-20 NOTE — Patient Instructions (Addendum)
Will make appointment for physical therapy  Go get xray at [place]  You got your flu shot today  Follow-up in 3 months  Your blood pressure should be below 130/80.  If you notice is stays higher than this, let me know

## 2012-07-20 NOTE — Assessment & Plan Note (Signed)
5 months ago, cr had increased further.  Will recheck today.

## 2012-07-20 NOTE — Progress Notes (Signed)
  Subjective:    Patient ID: Jennifer Foster, female    DOB: [birth year], 66 y.o.   MRN: [id number]  HPIHere for follow-up and to discuss back pain  Back pain:  Pain in low back for 6-8 weeks.  No injury.  No radiculopathy.  No numbness, weakness, tingling, loss of strength.  Interferes with daily activities.  Worse with standing, bending, flexing backwards. n ot improved with tylenol.   HYPERTENSION- brings her medications in today but states has not taken them yet this mornign.  BP Readings from Last 3 Encounters:  07/20/12 150/70  04/11/12 155/72  04/05/12 143/79    Hypertension ROS: taking medications as instructed, no medication side effects noted, no chest pain on exertion, no dyspnea on exertion and no swelling of ankles.    Depression/anxiety: states she has been doing well off medications.  Does not desire to restart.     Review of Systems    see HPi Objective:   Physical Exam  GEN: Alert & Oriented, No acute distress CV:  Regular Rate & Rhythm, no murmur Respiratory:  Normal work of breathing, CTAB Abd:  + BS, soft, no tenderness to palpation Ext: no pre-tibial edema Back: some ttp in lumbar spine area.  Also in trapezius muscles.  Neg straight leg raise.  Patellar reflexes 1+ bilaterally.  Strength in LE 5/5.  Hips with decreased external rotation equal bilaterally, does not cause pain.      Assessment & Plan:

## 2012-07-20 NOTE — Assessment & Plan Note (Signed)
Above goal, but has not taken medications today and is in pain.  Working to make few change each visit due to poor health literacy, will recheck at follow-up in 3 months and consider increasing therapy at that time if remains elevated.

## 2012-07-20 NOTE — Assessment & Plan Note (Signed)
Low back pain without red flags.  Some signs of other muscular tenderness.  Will obtain lumbar xray, refer to physical therapy. Discussed short course of tramadol and wil not plan on  Long term medical therapy at this time.  Discussed importance of staying active, weight loss.  May be a component of fibromyalgia, untreated anxiety- will continue to monitor.

## 2012-07-21 ENCOUNTER — Ambulatory Visit: Payer: PRIVATE HEALTH INSURANCE | Attending: Family Medicine

## 2012-07-21 DIAGNOSIS — R293 Abnormal posture: Secondary | ICD-10-CM | POA: Insufficient documentation

## 2012-07-21 DIAGNOSIS — M6281 Muscle weakness (generalized): Secondary | ICD-10-CM | POA: Insufficient documentation

## 2012-07-21 DIAGNOSIS — IMO0001 Reserved for inherently not codable concepts without codable children: Secondary | ICD-10-CM | POA: Insufficient documentation

## 2012-07-21 DIAGNOSIS — M545 Low back pain, unspecified: Secondary | ICD-10-CM | POA: Insufficient documentation

## 2012-07-28 ENCOUNTER — Ambulatory Visit: Payer: PRIVATE HEALTH INSURANCE | Admitting: Rehabilitation

## 2012-08-02 ENCOUNTER — Encounter: Payer: Self-pay | Admitting: Rehabilitation

## 2012-08-04 ENCOUNTER — Encounter: Payer: Self-pay | Admitting: Rehabilitation

## 2012-08-05 ENCOUNTER — Encounter: Payer: Self-pay | Admitting: Family Medicine

## 2012-08-05 ENCOUNTER — Ambulatory Visit (INDEPENDENT_AMBULATORY_CARE_PROVIDER_SITE_OTHER): Payer: PRIVATE HEALTH INSURANCE | Admitting: Family Medicine

## 2012-08-05 VITALS — BP 148/78 | HR 81 | Temp 98.6°F | Ht 64.0 in | Wt 187.7 lb

## 2012-08-05 DIAGNOSIS — I10 Essential (primary) hypertension: Secondary | ICD-10-CM

## 2012-08-05 DIAGNOSIS — R21 Rash and other nonspecific skin eruption: Secondary | ICD-10-CM | POA: Insufficient documentation

## 2012-08-05 MED ORDER — CARVEDILOL 6.25 MG PO TABS: 6.2500 mg | ORAL_TABLET | Freq: Two times a day (BID) | ORAL | Status: DC

## 2012-08-05 NOTE — Assessment & Plan Note (Addendum)
Remains elevated today.  Discussed with patient importance of tight control given CKD.  Will add coreg 6.25 bid.  Discussed tips on improving compliance- she plans on getting a pill box to help her organize.  BID medicine should not be an issue as she already take her BP meds in the morning and her lipitor at night.  Will follow-up in 1 month

## 2012-08-05 NOTE — Assessment & Plan Note (Signed)
likely due to tramadol.  Advised d/c, ok to use benadryl, continue liberal skin hydration.

## 2012-08-05 NOTE — Patient Instructions (Addendum)
New blood pressure medicine- carvedilol- take twice a day  Follow-up for blood pressure recheck in 3-4 weeks  Stop tramadol- ok to take benadryl for itching  Use lots of skin moisturizer petroleum jelly ok to use

## 2012-08-05 NOTE — Progress Notes (Signed)
  Subjective:    Patient ID: Jennifer Foster, female    DOB: 10-29-1945, 66 y.o.   MRN: [id number]  HPI  1 week of itching- occurred after starting tramadol.  Over arms, legs, trunk.  No rash.  Used benadryl, helps some then returns.  Husband not affected.  No fever, chills.  Htn:  Today not in pain, has taken BP medicines, BP remains high.  NO chest pain, edema.  Brings in medications, no bottle for amlodipine, but reports she is still taking it- but just has combined it into the same bottle as another med.  Review of Systems See HPI    Objective:   Physical Exam  GEN: Alert & Oriented, No acute distress Skin: no rash.  Patient scratching arms.  Dry skin       Assessment & Plan:

## 2012-08-12 ENCOUNTER — Other Ambulatory Visit: Payer: Self-pay | Admitting: Family Medicine

## 2012-09-14 ENCOUNTER — Other Ambulatory Visit: Payer: Self-pay | Admitting: Family Medicine

## 2012-09-14 MED ORDER — ROSUVASTATIN CALCIUM 20 MG PO TABS: 20.0000 mg | ORAL_TABLET | Freq: Every day | ORAL | Status: DC

## 2013-01-20 ENCOUNTER — Other Ambulatory Visit: Payer: Self-pay | Admitting: Family Medicine

## 2013-01-20 DIAGNOSIS — I10 Essential (primary) hypertension: Secondary | ICD-10-CM

## 2013-01-20 MED ORDER — CARVEDILOL 6.25 MG PO TABS: 6.2500 mg | ORAL_TABLET | Freq: Two times a day (BID) | ORAL | Status: DC

## 2013-01-26 ENCOUNTER — Telehealth: Payer: Self-pay | Admitting: Family Medicine

## 2013-01-26 MED ORDER — AMLODIPINE BESYLATE 10 MG PO TABS: 10.0000 mg | ORAL_TABLET | Freq: Every day | ORAL | Status: DC

## 2013-01-26 NOTE — Telephone Encounter (Signed)
Patient seein Dr Verdie Drown tomorrow, I already e-scribed her Norvasc.

## 2013-01-27 ENCOUNTER — Ambulatory Visit: Payer: No Typology Code available for payment source | Admitting: Family Medicine

## 2013-02-02 ENCOUNTER — Ambulatory Visit (INDEPENDENT_AMBULATORY_CARE_PROVIDER_SITE_OTHER): Payer: No Typology Code available for payment source | Admitting: Family Medicine

## 2013-02-02 VITALS — BP 143/79 | HR 79 | Temp 98.2°F | Ht 64.0 in | Wt 185.0 lb

## 2013-02-02 DIAGNOSIS — R350 Frequency of micturition: Secondary | ICD-10-CM

## 2013-02-02 DIAGNOSIS — I10 Essential (primary) hypertension: Secondary | ICD-10-CM

## 2013-02-02 DIAGNOSIS — D649 Anemia, unspecified: Secondary | ICD-10-CM

## 2013-02-02 DIAGNOSIS — Z0000 Encounter for general adult medical examination without abnormal findings: Secondary | ICD-10-CM | POA: Insufficient documentation

## 2013-02-02 DIAGNOSIS — E785 Hyperlipidemia, unspecified: Secondary | ICD-10-CM

## 2013-02-02 LAB — CBC
HCT: 35.9 % — ABNORMAL LOW (ref 36.0–46.0)
MCH: 27.8 pg (ref 26.0–34.0)
MCHC: 34 g/dL (ref 30.0–36.0)
RDW: 15.5 % (ref 11.5–15.5)

## 2013-02-02 MED ORDER — CARVEDILOL 6.25 MG PO TABS: 6.2500 mg | ORAL_TABLET | Freq: Two times a day (BID) | ORAL | Status: DC

## 2013-02-02 MED ORDER — TELMISARTAN-HCTZ 80-25 MG PO TABS: 1.0000 | ORAL_TABLET | Freq: Every day | ORAL | Status: DC

## 2013-02-02 MED ORDER — AMLODIPINE BESYLATE 10 MG PO TABS: 10.0000 mg | ORAL_TABLET | Freq: Every day | ORAL | Status: DC

## 2013-02-02 MED ORDER — ROSUVASTATIN CALCIUM 20 MG PO TABS: 20.0000 mg | ORAL_TABLET | Freq: Every day | ORAL | Status: DC

## 2013-02-02 NOTE — Assessment & Plan Note (Signed)
Check Hgb today

## 2013-02-02 NOTE — Assessment & Plan Note (Signed)
Continue statin. 

## 2013-02-02 NOTE — Patient Instructions (Addendum)
Please schedule a physical for a Pap smear with Dr. [name] if possible

## 2013-02-02 NOTE — Assessment & Plan Note (Signed)
Checking Cr today She endorses urinary frequency as well. Check UA.

## 2013-02-02 NOTE — Progress Notes (Signed)
  Subjective:    Patient ID: Loveah Hamideh, female    DOB: 1945/09/18, 67 y.o.   MRN: [id number]  HPI # HTN She ran out of her medications about a week ago  # HLD Denies abdominal pain, myalgias on statin  Review of Systems Denies chest pain, dyspnea, headaches, nausea, vomiting, constipation  Endorses urinary frequency  Allergies, medication, past medical history reviewed.  Smoking status noted.  HTN HLD GAD Chronic gastritis ?2/2 NSAID, history of diverticular bleeding Cocaine, tobacco use CKD stage 3   Anemia, microcytic    Objective:   Physical Exam GEN: NAD; well-nourished, -appearing PSYCH: anxious-appearing; she cried when we talked about getting blood work today CV: RRR PULM: NI WOB; CTAB without w/r/r EXT: non-pitting pretibial edema NEURO: moves all extremities well GAIT: normal     Assessment & Plan:

## 2013-02-02 NOTE — Assessment & Plan Note (Addendum)
Fair control but she has not been taking her medications.  -Restart Coreg 6.25 bid, telmisartan/HCTZ -Hold amlodipine for now -Check BMET

## 2013-02-02 NOTE — Assessment & Plan Note (Signed)
She is due for physical and Pap smear (last one in 2010, normal) She will try to schedule appointment with Dr. [name], her new PCP, next month

## 2013-02-03 ENCOUNTER — Encounter: Payer: Self-pay | Admitting: Family Medicine

## 2013-02-03 LAB — BASIC METABOLIC PANEL
CO2: 26 mEq/L (ref 19–32)
Calcium: 9.3 mg/dL (ref 8.4–10.5)
Chloride: 106 mEq/L (ref 96–112)
Creat: 1.47 mg/dL — ABNORMAL HIGH (ref 0.50–1.10)
Glucose, Bld: 111 mg/dL — ABNORMAL HIGH (ref 70–99)
Sodium: 141 mEq/L (ref 135–145)

## 2013-02-15 ENCOUNTER — Ambulatory Visit (INDEPENDENT_AMBULATORY_CARE_PROVIDER_SITE_OTHER): Payer: No Typology Code available for payment source | Admitting: Family Medicine

## 2013-02-15 ENCOUNTER — Encounter: Payer: Self-pay | Admitting: Family Medicine

## 2013-02-15 ENCOUNTER — Other Ambulatory Visit (HOSPITAL_COMMUNITY)
Admission: RE | Admit: 2013-02-15 | Discharge: 2013-02-15 | Disposition: A | Discharge status: Home / Self Care | Payer: No Typology Code available for payment source | Source: Ambulatory Visit | Attending: Family Medicine | Admitting: Family Medicine

## 2013-02-15 VITALS — BP 123/71 | HR 74 | Temp 98.8°F | Ht 64.0 in | Wt 187.0 lb

## 2013-02-15 DIAGNOSIS — F329 Major depressive disorder, single episode, unspecified: Secondary | ICD-10-CM

## 2013-02-15 DIAGNOSIS — Z124 Encounter for screening for malignant neoplasm of cervix: Secondary | ICD-10-CM

## 2013-02-15 DIAGNOSIS — Z01419 Encounter for gynecological examination (general) (routine) without abnormal findings: Secondary | ICD-10-CM

## 2013-02-15 DIAGNOSIS — Z0000 Encounter for general adult medical examination without abnormal findings: Secondary | ICD-10-CM

## 2013-02-15 DIAGNOSIS — Z1151 Encounter for screening for human papillomavirus (HPV): Secondary | ICD-10-CM | POA: Insufficient documentation

## 2013-02-15 MED ORDER — ESCITALOPRAM OXALATE 10 MG PO TABS: 10.0000 mg | ORAL_TABLET | Freq: Every day | ORAL | Status: DC

## 2013-02-15 NOTE — Assessment & Plan Note (Signed)
Up to date with vaccination,she stated she had tetanus shot,unsure when. PAP done today but I feel her cervix was distorted and due to hx of partial hysterectomy,for her next PAP I will recommend pelvic U/S to check for cervix,if negative then she does not need PAP. Mammogram up to as well as colonoscopy,report for both reviewed. Anticipatory guidance given.

## 2013-02-15 NOTE — Patient Instructions (Signed)
Depression, Adult Depression is feeling sad, low, down in the dumps, blue, gloomy, or empty. In general, there are two kinds of depression:  Normal sadness or grief. This can happen after something upsetting. It often goes away on its own within 2 weeks. After losing a loved one (bereavement), normal sadness and grief may last longer than two weeks. It usually gets better with time.  Clinical depression. This kind lasts longer than normal sadness or grief. It keeps you from doing the things you normally do in life. It is often hard to function at home, work, or at school. It may affect your relationships with others. Treatment is often needed. GET HELP RIGHT AWAY IF:  You have thoughts about hurting yourself or others.  You lose touch with reality (psychotic symptoms). You may:  See or hear things that are not real.  Have untrue beliefs about your life or people around you.  Your medicine is giving you problems. MAKE SURE YOU:  Understand these instructions.  Will watch your condition.  Will get help right away if you are not doing well or get worse. Document Released: 09/20/2010 Document Revised: 05/12/2012 Document Reviewed: 09/20/2010 [facility] Patient Information 2014 [place], [place].

## 2013-02-15 NOTE — Assessment & Plan Note (Signed)
Due to family stressor. Not suicidal. Counseling done on relaxation technique and CBT. Trial of Lexapro for depression and anxiety. RTC in 4 wks for reassessment.

## 2013-02-15 NOTE — Progress Notes (Signed)
Patient ID: Jennifer Foster, female   DOB: [birth year], 67 y.o.   MRN: [id number] Subjective:     Jennifer Foster is a 67 y.o. female and is here for a comprehensive physical exam. The patient reports problems - anxiety and sadness,her son passed in 2012 and her grandson has been giving her issues,being oppositional and he bad mouth her. This problem has been on going since 2012.At times she feels like hurting herself,but stated she will never hurt herself.Denies current suicidal ideation.  History   Social History  . Marital Status: Married    Spouse Name: N/A    Number of Children: N/A  . Years of Education: N/A   Occupational History  . unemployed    Social History Main Topics  . Smoking status: Current Every Day Smoker -- 0.30 packs/day    Types: Cigarettes  . Smokeless tobacco: Not on file  . Alcohol Use: 4.2 oz/week    7 Cans of beer per week     Comment: 40 oz cans  . Drug Use: No  . Sexually Active: Yes   Other Topics Concern  . Not on file   Social History Narrative   Lives with husband Shandelle Notarianni who is also an [facility] patient.  History of Marijuana and crack use.  Had cocaine + in May 2012   Health Maintenance  Topic Date Due  . Tetanus/tdap  04/16/1967  . Zostavax  04/15/2008  . Influenza Vaccine  05/02/2013  . Mammogram  05/24/2014  . Colonoscopy  09/15/2016    The following portions of the patient's history were reviewed and updated as appropriate: allergies, current medications, past family history, past medical history, past social history, past surgical history and problem list.  Review of Systems Pertinent items are noted in HPI.   Objective:     Physical Exam  Nursing note and vitals reviewed. Constitutional: She is oriented to person, place, and time. She appears well-developed. No distress.  HENT:  Head: Normocephalic and atraumatic.  Right Ear: External ear normal.  Left Ear: External ear normal.  Nose: Nose normal.  Mouth/Throat: Oropharynx is clear  and moist.  Eyes: Conjunctivae and EOM are normal. Pupils are equal, round, and reactive to light.  Neck: Normal range of motion. Neck supple.  Cardiovascular: Normal rate, regular rhythm and normal heart sounds.   No murmur heard. Pulmonary/Chest: Effort normal and breath sounds normal. No respiratory distress. She has no wheezes. Right breast exhibits no inverted nipple, no mass, no nipple discharge, no skin change and no tenderness. Left breast exhibits no inverted nipple, no mass, no nipple discharge, no skin change and no tenderness. Breasts are symmetrical. There is no breast swelling.  Abdominal: Soft. Bowel sounds are normal. She exhibits no distension.  Genitourinary: Vagina normal. No breast bleeding. There is no rash or tenderness on the right labia. There is no rash or tenderness on the left labia. Right adnexum displays no mass. Left adnexum displays no mass.    Musculoskeletal: Normal range of motion.  Neurological: She is alert and oriented to person, place, and time. She has normal reflexes. No cranial nerve deficit.  Skin: Skin is warm and dry.  Psychiatric: Her speech is normal and behavior is normal. Judgment and thought content normal. Her mood appears not anxious. She is not actively hallucinating. Cognition and memory are normal. She exhibits a depressed mood.  Patient tearing intermittently during exam while talking about her grandson. She is attentive.     Assessment:    Healthy female  exam. Depression on going since 2012 due to family stress,gradually worsening.      Plan:     See After Visit Summary for Counseling Recommendations

## 2013-03-15 ENCOUNTER — Encounter: Payer: Self-pay | Admitting: Family Medicine

## 2013-03-15 ENCOUNTER — Ambulatory Visit (INDEPENDENT_AMBULATORY_CARE_PROVIDER_SITE_OTHER): Payer: No Typology Code available for payment source | Admitting: Family Medicine

## 2013-03-15 VITALS — BP 147/81 | HR 71 | Temp 97.8°F | Wt 192.0 lb

## 2013-03-15 DIAGNOSIS — I10 Essential (primary) hypertension: Secondary | ICD-10-CM

## 2013-03-15 DIAGNOSIS — R609 Edema, unspecified: Secondary | ICD-10-CM

## 2013-03-15 DIAGNOSIS — F329 Major depressive disorder, single episode, unspecified: Secondary | ICD-10-CM

## 2013-03-15 DIAGNOSIS — R600 Localized edema: Secondary | ICD-10-CM

## 2013-03-15 MED ORDER — CARVEDILOL 12.5 MG PO TABS: 6.2500 mg | ORAL_TABLET | Freq: Two times a day (BID) | ORAL | Status: DC

## 2013-03-15 NOTE — Assessment & Plan Note (Signed)
Last Creatinine checked in June improved from that in 2013. Counseled in avoiding nephrotoxic agent. Avoid diuretics for now. Recheck BMet at next visit.

## 2013-03-15 NOTE — Assessment & Plan Note (Signed)
I again counseled her on her mood disorder. I still recommended starting Lexapro which she promised to pick up today. She has improved just a little from last visit but not suicidal or danger to self or to others. I will reassess her at next visit.

## 2013-03-15 NOTE — Progress Notes (Signed)
Subjective:     Patient ID: Jennifer Foster, female   DOB: [birth year], 67 y.o.   MRN: [id number]  HPI Leg swelling:C/O worsening b/l lower limb swelling for the last 3 days. She denies any pain,no trauma to her legs,no change in medication except for water pills which was discontinued a while back,she mentioned hx of CHF but not sure. HTN: Currently on Norvasc 10 mg qd and Coreg 6.25 mg BID. CKD; Patient mentioned she was on water pilled but was d/c due to kidney problem,she is scared of getting on dialysis,her sister is on dialysis,her mom had kidney disease,also a cousin on dialysis. Anxiety/Depression: Here for follow up,her family situation has improved but still crys with every little thing,she denies any suicidal ideation,she has not yet picked up her Lexapro from the pharmacy.  Current Outpatient Prescriptions on File Prior to Visit  Medication Sig Dispense Refill  . amLODipine (NORVASC) 10 MG tablet Take 10 mg by mouth daily.      . carvedilol (COREG) 6.25 MG tablet Take 1 tablet (6.25 mg total) by mouth 2 (two) times daily with a meal.  180 tablet  3  . rosuvastatin (CRESTOR) 20 MG tablet Take 1 tablet (20 mg total) by mouth daily. Per [facility] MAP  90 tablet  3  . escitalopram (LEXAPRO) 10 MG tablet Take 1 tablet (10 mg total) by mouth daily.  30 tablet  1   No current facility-administered medications on file prior to visit.   Past Medical History  Diagnosis Date  . Hypertension   . Joint pain   . GERD (gastroesophageal reflux disease)   . Hyperlipemia   . History of asthma   . History of anemia   . Lipoma   . Asthma      Review of Systems  Respiratory: Negative.   Cardiovascular: Negative.   Gastrointestinal: Negative.   Genitourinary: Negative.   Neurological: Negative.   Psychiatric/Behavioral: Negative for suicidal ideas and self-injury. The patient is nervous/anxious.   All other systems reviewed and are negative.   Filed Vitals:   03/15/13 0857  BP: 147/81  Pulse:  71  Temp: 97.8 F (36.6 C)  TempSrc: Oral  Weight: 192 lb (87.091 kg)       Objective:   Physical Exam  Nursing note and vitals reviewed. Constitutional: She is oriented to person, place, and time. She appears well-developed. No distress.  Cardiovascular: Normal rate, regular rhythm, normal heart sounds and intact distal pulses.   No murmur heard. Pulmonary/Chest: Effort normal and breath sounds normal. No respiratory distress. She has no wheezes.  Abdominal: Soft. Bowel sounds are normal. She exhibits no distension and no mass. There is no tenderness.  Musculoskeletal: Normal range of motion. She exhibits edema. She exhibits no tenderness.  B/L ankle edema about 2++  Neurological: She is alert and oriented to person, place, and time.  Psychiatric: Her behavior is normal. Her mood appears anxious. Her speech is not rapid and/or pressured. Thought content is not delusional. Cognition and memory are normal. She does not exhibit a depressed mood. She expresses no homicidal and no suicidal ideation.  Teary when asked about getting lab work done,she stated she is scared of needle.       Assessment/Plan:

## 2013-03-15 NOTE — Patient Instructions (Signed)
Edema Edema is a buildup of fluids. It is most common in the feet, ankles, and legs. This happens more as a person ages. It may affect one or both legs. HOME CARE   Raise (elevate) the legs or ankles above the level of the heart while lying down.  Avoid sitting or standing still for a long time.  Exercise the legs to help the puffiness (swelling) go down.  A low-salt diet may help lessen the puffiness.  Only take medicine as told by your doctor. GET HELP RIGHT AWAY IF:   You develop shortness of breath or chest pain.  You cannot breathe when you lie down.  You have more puffiness that does not go away with treatment.  You develop pain or redness in the areas that are puffy.  You have a temperature by mouth above 102 F (38.9 C), not controlled by medicine.  You gain 3 lb/1.4 kg or more in 1 day or 5 lb/2.3 kg in a week. MAKE SURE YOU:   Understand these instructions.  Will watch your condition.  Will get help right away if you are not doing well or get worse. Document Released: 02/04/2008 Document Revised: 11/10/2011 Document Reviewed: 02/04/2008 [facility] Patient Information 2014 [place], [place].

## 2013-03-15 NOTE — Assessment & Plan Note (Addendum)
BP slightly elevated despite medication compliance. Norvasc held today.  Increased her Coreg to 12.5 mg BID. FLP recommended but patient declined blood work today. F/U in 2 wks for reassessment.

## 2013-03-15 NOTE — Assessment & Plan Note (Signed)
Etiology unclear possible causes include; Norvasc/Medication,CHF,CKD Hold Norvasc for now to assess for improvement. ECHO ordered for LVEF. Advised to avoid salt intake,elevated LL when sitting. I also recommended compression stockings which she can get OTC. F/U in 2wks for reassessment.

## 2013-03-16 ENCOUNTER — Ambulatory Visit (HOSPITAL_COMMUNITY)
Admission: RE | Admit: 2013-03-16 | Discharge: 2013-03-16 | Disposition: A | Discharge status: Home / Self Care | Payer: No Typology Code available for payment source | Source: Ambulatory Visit | Attending: Family Medicine | Admitting: Family Medicine

## 2013-03-16 DIAGNOSIS — I10 Essential (primary) hypertension: Secondary | ICD-10-CM | POA: Insufficient documentation

## 2013-03-16 DIAGNOSIS — I059 Rheumatic mitral valve disease, unspecified: Secondary | ICD-10-CM

## 2013-03-16 DIAGNOSIS — I079 Rheumatic tricuspid valve disease, unspecified: Secondary | ICD-10-CM | POA: Insufficient documentation

## 2013-03-16 DIAGNOSIS — I509 Heart failure, unspecified: Secondary | ICD-10-CM | POA: Insufficient documentation

## 2013-03-16 DIAGNOSIS — R600 Localized edema: Secondary | ICD-10-CM

## 2013-03-16 NOTE — Progress Notes (Signed)
Echocardiogram 2D Echocardiogram has been performed.  Nieves Barberi 03/16/2013, 11:39 AM

## 2013-03-22 ENCOUNTER — Ambulatory Visit: Payer: No Typology Code available for payment source | Admitting: Family Medicine

## 2013-03-22 NOTE — Progress Notes (Signed)
Pt unable to complete renewal of [facility] card due to patient now Medicare eligible.

## 2013-03-29 ENCOUNTER — Ambulatory Visit: Payer: No Typology Code available for payment source | Admitting: Family Medicine

## 2013-04-12 ENCOUNTER — Encounter: Payer: Self-pay | Admitting: Family Medicine

## 2013-04-12 ENCOUNTER — Ambulatory Visit (INDEPENDENT_AMBULATORY_CARE_PROVIDER_SITE_OTHER): Payer: Medicare HMO | Admitting: Family Medicine

## 2013-04-12 VITALS — BP 148/69 | HR 66 | Temp 97.8°F | Ht 64.0 in | Wt 187.0 lb

## 2013-04-12 DIAGNOSIS — R609 Edema, unspecified: Secondary | ICD-10-CM

## 2013-04-12 DIAGNOSIS — I10 Essential (primary) hypertension: Secondary | ICD-10-CM

## 2013-04-12 DIAGNOSIS — R600 Localized edema: Secondary | ICD-10-CM

## 2013-04-12 DIAGNOSIS — E785 Hyperlipidemia, unspecified: Secondary | ICD-10-CM

## 2013-04-12 NOTE — Progress Notes (Signed)
Subjective:     Patient ID: Jennifer Foster, female   DOB: December 07, 1945, 67 y.o.   MRN: [id number]  HPI Edema:B/L leg swelling improved just a little after holding Norvasc,her left foot is more swollen than right associated with ankle pain,she had cut back on her salt in take,she is yet to obtain compression stocking due to financial issue,but she stated she would be able to get it this week. HTN: here to follow up after d/c Norvasc. HLD: Here for follow up,compliant with Crestor 20 mg.  Current Outpatient Prescriptions on File Prior to Visit  Medication Sig Dispense Refill  . carvedilol (COREG) 12.5 MG tablet Take 0.5 tablets (6.25 mg total) by mouth 2 (two) times daily with a meal.  180 tablet  0  . escitalopram (LEXAPRO) 10 MG tablet Take 1 tablet (10 mg total) by mouth daily.  30 tablet  1  . rosuvastatin (CRESTOR) 20 MG tablet Take 1 tablet (20 mg total) by mouth daily. Per [facility] MAP  90 tablet  3  . amLODipine (NORVASC) 10 MG tablet Take 10 mg by mouth daily.       No current facility-administered medications on file prior to visit.   Past Medical History  Diagnosis Date  . Hypertension   . Joint pain   . GERD (gastroesophageal reflux disease)   . Hyperlipemia   . History of asthma   . History of anemia   . Lipoma   . Asthma      Review of Systems  Respiratory: Negative.   Cardiovascular: Negative.   Gastrointestinal: Negative.   Genitourinary: Negative.   Musculoskeletal:       Ankle swelling  All other systems reviewed and are negative.   Filed Vitals:   04/12/13 0857  BP: 148/69  Pulse: 66  Temp: 97.8 F (36.6 C)  TempSrc: Oral  Height: 5\' 4"  (1.626 m)  Weight: 187 lb (84.823 kg)       Objective:   Physical Exam  Nursing note and vitals reviewed. Constitutional: She is oriented to person, place, and time. She appears well-developed. No distress.  Cardiovascular: Normal rate, regular rhythm, normal heart sounds and intact distal pulses.   No murmur  heard. Pulmonary/Chest: Effort normal and breath sounds normal. No respiratory distress. She has no wheezes.  Abdominal: Soft. Bowel sounds are normal. She exhibits no distension and no mass. There is no tenderness.  Musculoskeletal: Normal range of motion. She exhibits edema.       Right ankle: She exhibits normal range of motion. No tenderness.       Left ankle: She exhibits swelling. She exhibits normal range of motion.       Feet:  Neurological: She is oriented to person, place, and time.       Assessment/Plan:     Edema: HTN: HLD:

## 2013-04-12 NOTE — Assessment & Plan Note (Signed)
Improved a little from last visit. Continue to hold Norvasc for now. I do not want to start diuretic due to CKD. Avoid salt intake and obtain compression stocking. I reviewed and discussed her ECHO report with her. RTC in 4 wks for reassessment or sooner if symptom worsens.

## 2013-04-12 NOTE — Patient Instructions (Addendum)
Edema Edema is a buildup of fluids. It is most common in the feet, ankles, and legs. This happens more as a person ages. It may affect one or both legs. HOME CARE   Raise (elevate) the legs or ankles above the level of the heart while lying down.  Avoid sitting or standing still for a long time.  Exercise the legs to help the puffiness (swelling) go down.  A low-salt diet may help lessen the puffiness.  Only take medicine as told by your doctor. GET HELP RIGHT AWAY IF:   You develop shortness of breath or chest pain.  You cannot breathe when you lie down.  You have more puffiness that does not go away with treatment.  You develop pain or redness in the areas that are puffy.  You have a temperature by mouth above 102 F (38.9 C), not controlled by medicine.  You gain 3 lb/1.4 kg or more in 1 day or 5 lb/2.3 kg in a week. MAKE SURE YOU:   Understand these instructions.  Will watch your condition.  Will get help right away if you are not doing well or get worse. Document Released: 02/04/2008 Document Revised: 11/10/2011 Document Reviewed: 02/04/2008 [facility] Patient Information 2014 [place], [place].

## 2013-04-12 NOTE — Assessment & Plan Note (Signed)
Currently on Crestor 20 mg qd. Last LDL was 47 in 2013. Patient is not interested in blood work at this time. Since LDL was very good the last time,this can be deferred while she continues to take her Crestor.

## 2013-04-12 NOTE — Assessment & Plan Note (Signed)
BP looks good despite not taking Norvasc. Continue to hold Norvasc. Currently on Coreg 12.5 mg BID.

## 2013-04-19 ENCOUNTER — Other Ambulatory Visit: Payer: Self-pay

## 2013-04-19 DIAGNOSIS — Z1231 Encounter for screening mammogram for malignant neoplasm of breast: Secondary | ICD-10-CM

## 2013-05-10 ENCOUNTER — Ambulatory Visit: Payer: Medicare Other | Admitting: Family Medicine

## 2013-05-11 ENCOUNTER — Ambulatory Visit: Payer: Medicare HMO

## 2013-05-11 ENCOUNTER — Ambulatory Visit: Payer: Medicare Other

## 2013-06-20 ENCOUNTER — Emergency Department (HOSPITAL_COMMUNITY): Payer: Medicare HMO

## 2013-06-20 ENCOUNTER — Encounter (HOSPITAL_COMMUNITY): Payer: Self-pay | Admitting: Emergency Medicine

## 2013-06-20 ENCOUNTER — Emergency Department (HOSPITAL_COMMUNITY)
Admission: EM | Admit: 2013-06-20 | Discharge: 2013-06-20 | Disposition: A | Discharge status: Home / Self Care | Payer: Medicare HMO | Attending: Emergency Medicine | Admitting: Emergency Medicine

## 2013-06-20 DIAGNOSIS — Y9389 Activity, other specified: Secondary | ICD-10-CM | POA: Insufficient documentation

## 2013-06-20 DIAGNOSIS — S93401A Sprain of unspecified ligament of right ankle, initial encounter: Secondary | ICD-10-CM

## 2013-06-20 DIAGNOSIS — I10 Essential (primary) hypertension: Secondary | ICD-10-CM | POA: Insufficient documentation

## 2013-06-20 DIAGNOSIS — Z79899 Other long term (current) drug therapy: Secondary | ICD-10-CM | POA: Insufficient documentation

## 2013-06-20 DIAGNOSIS — X500XXA Overexertion from strenuous movement or load, initial encounter: Secondary | ICD-10-CM | POA: Insufficient documentation

## 2013-06-20 DIAGNOSIS — Y929 Unspecified place or not applicable: Secondary | ICD-10-CM | POA: Insufficient documentation

## 2013-06-20 DIAGNOSIS — J45909 Unspecified asthma, uncomplicated: Secondary | ICD-10-CM | POA: Insufficient documentation

## 2013-06-20 DIAGNOSIS — Z8719 Personal history of other diseases of the digestive system: Secondary | ICD-10-CM | POA: Insufficient documentation

## 2013-06-20 DIAGNOSIS — F17200 Nicotine dependence, unspecified, uncomplicated: Secondary | ICD-10-CM | POA: Insufficient documentation

## 2013-06-20 DIAGNOSIS — S93409A Sprain of unspecified ligament of unspecified ankle, initial encounter: Secondary | ICD-10-CM | POA: Insufficient documentation

## 2013-06-20 DIAGNOSIS — Z862 Personal history of diseases of the blood and blood-forming organs and certain disorders involving the immune mechanism: Secondary | ICD-10-CM | POA: Insufficient documentation

## 2013-06-20 DIAGNOSIS — E785 Hyperlipidemia, unspecified: Secondary | ICD-10-CM | POA: Insufficient documentation

## 2013-06-20 MED ORDER — HYDROCODONE-ACETAMINOPHEN 5-325 MG PO TABS
1.0000 | ORAL_TABLET | ORAL | Status: DC | PRN
Start: 2013-06-20 — End: 2013-10-11

## 2013-06-20 MED ORDER — ACETAMINOPHEN 500 MG PO TABS
1000.0000 mg | ORAL_TABLET | Freq: Once | ORAL | Status: AC
  Administered 2013-06-20: 1000 mg via ORAL
  Filled 2013-06-20: qty 2

## 2013-06-20 NOTE — ED Notes (Signed)
Patient reports that she twisted her right ankle 3 days ago and since then has twisted her right ankle x 2 in the past 2 days while walking. Right ankle swollen and painful.

## 2013-06-20 NOTE — ED Provider Notes (Signed)
CSN: [id number]     Arrival date & time 06/20/13  1033 History   First MD Initiated Contact with Patient 06/20/13 1051     Chief Complaint  Patient presents with  . Ankle Injury   (Consider location/radiation/quality/duration/timing/severity/associated sxs/prior Treatment) HPI  Jennifer Foster is a 67 year old female who presents the emergency department with chief complaint of right ankle pain.  Patient states 3 days ago she was wearing heels at a funeral when she had an ankle inversion sprain.  Patient states she was able to ambulate minimally on the ankle.  Yesterday she was wearing flat and she states she had another ankle sprain on the right.  The patient complains of pain tenderness and difficulty ambulating on the ankle.  She says her pain is worse over the mid dorsum of the foot and lateral malleolus.  She denies numbness, tingling.  Patient denies knee pain  Past Medical History  Diagnosis Date  . Hypertension   . Joint pain   . GERD (gastroesophageal reflux disease)   . Hyperlipemia   . History of asthma   . History of anemia   . Lipoma   . Asthma    Past Surgical History  Procedure Laterality Date  . Abdominal hysterectomy  1981    partial, per pt history  . Lipoma excision  01/28/11    neck   Family History  Problem Relation Age of Onset  . Diabetes type II    . Kidney failure    . Kidney failure Son    History  Substance Use Topics  . Smoking status: Current Every Day Smoker -- 0.30 packs/day    Types: Cigarettes  . Smokeless tobacco: Never Used  . Alcohol Use: 0.0 oz/week     Comment: occasionally   OB History   Grav Para Term Preterm Abortions TAB SAB Ect Mult Living                 Review of Systems  Musculoskeletal: Positive for gait problem and joint swelling.  Neurological: Negative for weakness and numbness.  Hematological: Does not bruise/bleed easily.  Psychiatric/Behavioral: Negative for behavioral problems.    Allergies  Ace inhibitors;  Lisinopril; and Tramadol  Home Medications   Current Outpatient Rx  Name  Route  Sig  Dispense  Refill  . albuterol (PROVENTIL HFA;VENTOLIN HFA) 108 (90 BASE) MCG/ACT inhaler   Inhalation   Inhale 2 puffs into the lungs every 6 (six) hours as needed for wheezing or shortness of breath.         . carvedilol (COREG) 12.5 MG tablet   Oral   Take 6.25 mg by mouth 2 (two) times daily with a meal.         . rosuvastatin (CRESTOR) 20 MG tablet   Oral   Take 1 tablet (20 mg total) by mouth daily. Per [facility] MAP   90 tablet   3    BP 188/99  Pulse 71  Temp(Src) 98 F (36.7 C) (Oral)  Resp 20  SpO2 99% Physical Exam  Nursing note and vitals reviewed. Constitutional: She is oriented to person, place, and time. She appears well-developed and well-nourished. No distress.  HENT:  Head: Normocephalic and atraumatic.  Eyes: Conjunctivae are normal. No scleral icterus.  Neck: Normal range of motion.  Cardiovascular: Normal rate, regular rhythm and normal heart sounds.  Exam reveals no gallop and no friction rub.   No murmur heard. Pulmonary/Chest: Effort normal and breath sounds normal. No respiratory distress.  Abdominal: Soft.  Bowel sounds are normal. She exhibits no distension and no mass. There is no tenderness. There is no guarding.  Musculoskeletal: She exhibits edema and tenderness.  There is edema and tenderness of the right ankle without overt deformity.  Tender to palpation along the mid dorsal foot and the lateral malleolus.  No metatarsal tenderness.  Neurovascularly intact.  He should able to wiggle her toes.  Neurological: She is alert and oriented to person, place, and time.  Skin: Skin is warm and dry. She is not diaphoretic.  Psychiatric: She has a normal mood and affect. Her behavior is normal.    ED Course  Procedures (including critical care time) Labs Review Labs Reviewed - No data to display Imaging Review No results found.  EKG Interpretation   None        MDM   1. Ankle sprain, right, initial encounter    Patient X-Ray negative for obvious fracture or dislocation. Pain managed in ED. Pt advised to follow up with orthopedics if symptoms persist for possibility of missed fracture diagnosis. Patient given brace while in ED, conservative therapy recommended and discussed. Patient will be dc home & is agreeable with above plan.   Margarita Mail, PA-C 06/20/13 1222

## 2013-06-21 ENCOUNTER — Ambulatory Visit: Payer: Medicare HMO | Admitting: Family Medicine

## 2013-06-24 NOTE — ED Provider Notes (Signed)
Medical screening examination/treatment/procedure(s) were performed by non-physician practitioner and as supervising physician I was immediately available for consultation/collaboration.    Johnna Acosta, MD 06/24/13 [phone]

## 2013-07-05 ENCOUNTER — Ambulatory Visit (INDEPENDENT_AMBULATORY_CARE_PROVIDER_SITE_OTHER): Payer: Medicare HMO | Admitting: Family Medicine

## 2013-07-05 ENCOUNTER — Encounter: Payer: Self-pay | Admitting: Family Medicine

## 2013-07-05 VITALS — BP 186/101 | HR 70 | Temp 98.1°F | Ht 64.0 in | Wt 188.0 lb

## 2013-07-05 DIAGNOSIS — S93401D Sprain of unspecified ligament of right ankle, subsequent encounter: Secondary | ICD-10-CM

## 2013-07-05 DIAGNOSIS — Z0000 Encounter for general adult medical examination without abnormal findings: Secondary | ICD-10-CM

## 2013-07-05 DIAGNOSIS — I10 Essential (primary) hypertension: Secondary | ICD-10-CM

## 2013-07-05 DIAGNOSIS — Z5189 Encounter for other specified aftercare: Secondary | ICD-10-CM

## 2013-07-05 DIAGNOSIS — F329 Major depressive disorder, single episode, unspecified: Secondary | ICD-10-CM

## 2013-07-05 DIAGNOSIS — S93401A Sprain of unspecified ligament of right ankle, initial encounter: Secondary | ICD-10-CM | POA: Insufficient documentation

## 2013-07-05 DIAGNOSIS — R7301 Impaired fasting glucose: Secondary | ICD-10-CM

## 2013-07-05 DIAGNOSIS — E785 Hyperlipidemia, unspecified: Secondary | ICD-10-CM

## 2013-07-05 MED ORDER — PAROXETINE HCL 20 MG PO TABS: 20.0000 mg | ORAL_TABLET | Freq: Every day | ORAL | Status: DC

## 2013-07-05 MED ORDER — CARVEDILOL 12.5 MG PO TABS: 12.5000 mg | ORAL_TABLET | Freq: Two times a day (BID) | ORAL | Status: DC

## 2013-07-05 NOTE — Assessment & Plan Note (Addendum)
BP elevated due to non-adherence and dose confusion. Patient asymptomatic. I made her take Coreg 12.5 mg in the office. BP rechecked after 30 min improved. I called her pharmacy to adjust her prescription and reeducate her that she is to take Coreg 12.5 mg BID. Continue home BP check and f/u in 1 wks or sooner if BP remains elevated. She verbalized understanding of all instruction given.

## 2013-07-05 NOTE — Assessment & Plan Note (Signed)
ED xray reviewed,no fracture or dislocation. I referred to PT for further management. Tylenol prn pain recommended.

## 2013-07-05 NOTE — Assessment & Plan Note (Signed)
Counseling done and i restarted her on Paxil.

## 2013-07-05 NOTE — Assessment & Plan Note (Signed)
I recommended Tdap and flu shot. She declined both today. Otherwise up to date with health care maintenance.

## 2013-07-05 NOTE — Patient Instructions (Signed)
Ankle Exercises for Rehabilitation Following ankle injuries, it is as important to follow your caregivers instructions for regaining full use of your ankle as it was to follow the initial treatment plan following the injury. The following are some suggestions for exercises and treatment, which can be done to help you regain full use of your ankle as soon as possible.  Follow all instructions regarding physical therapy.  Before exercising, it may be helpful to use heat on the muscles or joint being exercised. This loosens up the muscles and tendons (cord like structure) and decreases chances of injury during your exercises. If this is not possible just begin your exercises slowly to gradually warm up.  Stand on your toes several times per dayto strengthen the calf muscles. These are the muscles in the back of your leg between the knee and the heel. The cord you can feel just above the heel is the Achilles tendon. Rise up on your toes several times repeating this three to four times per day. Do not exercise to the point of pain. If pain starts to develop, decrease the exercise until you are comfortable again.  Do range of motion exercises. This means moving the ankle in all directions. Practice writing the alphabet with your toes in the air. Do not increase beyond a range that is comfortable.  Increase the strength of the muscles in the front of your leg by raising your toes and foot straight up in the air. Repeat this exercise as you did the calf exercise with the same warnings. This also help to stretch your muscles.  Stretch your calf muscles also by leaning against a wall with your hands in front of you. Put your feet a few feet from the wall and bend your knees until you feel the muscles in your calves become tight.  After exercising it may be helpful to put ice on the ankle to prevent swelling and improve rehabilitation. This may be done for 15 to 20 minutes following your exercises. If exercising  is being done in the work place, this may not always be possible.  Taping an ankle injury may be helpful to give added support following an injury. It also may help prevent re-injury. This may be true if you are in training or in a conditioning program. You and your caregiver can decide on the best course of action to follow. Document Released: 08/15/2000 Document Revised: 11/10/2011 Document Reviewed: 08/12/2008 [facility] Patient Information 2014 [place], [place].

## 2013-07-05 NOTE — Progress Notes (Signed)
Subjective:     Patient ID: Jennifer Foster, female   DOB: 03-22-1946, 67 y.o.   MRN: [id number]  HPI HTN:She had not taken her BP medication today,she has been taking Coreg 6.25 mg BID instead of 12.5mg  BID as instructed at last visit. Denies any symptoms. [id number] with her Crestor,here for FLP. Depression:Patient has not been taking her Paxil as instructed,denies any worsening of her symptoms,she denies suicidal ideation. Ankle pain:She twisted her ankle Oct 20th,was assessed at the ED and discharged home on pain medicine,she is still having pain especially with weight bearing. Here for follow up. Impaired Glucose: Asymptomatic, here for A1C. Health maintenance: Has not had Flu shot or Tdap.  Current Outpatient Prescriptions on File Prior to Visit  Medication Sig Dispense Refill  . albuterol (PROVENTIL HFA;VENTOLIN HFA) 108 (90 BASE) MCG/ACT inhaler Inhale 2 puffs into the lungs every 6 (six) hours as needed for wheezing or shortness of breath.      . carvedilol (COREG) 12.5 MG tablet Take 6.25 mg by mouth 2 (two) times daily with a meal.      . HYDROcodone-acetaminophen (NORCO) 5-325 MG per tablet Take 1 tablet by mouth every 4 (four) hours as needed for pain.  10 tablet  0  . rosuvastatin (CRESTOR) 20 MG tablet Take 1 tablet (20 mg total) by mouth daily. Per [facility] MAP  90 tablet  3   No current facility-administered medications on file prior to visit.   Past Medical History  Diagnosis Date  . Hypertension   . Joint pain   . GERD (gastroesophageal reflux disease)   . Hyperlipemia   . History of asthma   . History of anemia   . Lipoma   . Asthma       Review of Systems  Respiratory: Negative.   Cardiovascular: Negative.   Gastrointestinal: Negative.   Genitourinary: Negative.   Neurological: Negative.   Psychiatric/Behavioral: Negative for suicidal ideas and self-injury. The patient is nervous/anxious.   All other systems reviewed and are negative.   Filed Vitals:   07/05/13 1014 07/05/13 1050  BP: 212/101 186/101  Pulse: 70   Temp: 98.1 F (36.7 C)   TempSrc: Oral   Height: 5\' 4"  (1.626 m)   Weight: 188 lb (85.276 kg)        Objective:   Physical Exam  Nursing note and vitals reviewed. Constitutional: She is oriented to person, place, and time. She appears well-developed. No distress.  Cardiovascular: Normal rate, regular rhythm, normal heart sounds and intact distal pulses.   No murmur heard. Pulmonary/Chest: Effort normal and breath sounds normal. No respiratory distress. She has no wheezes.  Abdominal: Soft. Bowel sounds are normal. She exhibits no distension. There is no tenderness.  Neurological: She is alert and oriented to person, place, and time.  Psychiatric: Her behavior is normal. Thought content normal.  A little anxious due to needle she would be getting blood drawn       Assessment:     HTN HLD Depression Ankle sprain Impaired fasting glucose Health maintenance    Plan:     Check problem list.      Total time spent on face to face encounter and coordination of care is more than 45 min.

## 2013-07-05 NOTE — Assessment & Plan Note (Signed)
A1c checked. 

## 2013-07-05 NOTE — Assessment & Plan Note (Signed)
Compliant wit Crestor. FLP checked today. I will call her with result.

## 2013-07-06 LAB — LIPID PANEL
LDL Cholesterol: 133 mg/dL — ABNORMAL HIGH (ref 0–99)
Triglycerides: 152 mg/dL — ABNORMAL HIGH (ref <150)

## 2013-07-19 ENCOUNTER — Ambulatory Visit (INDEPENDENT_AMBULATORY_CARE_PROVIDER_SITE_OTHER): Payer: Medicare HMO | Admitting: Family Medicine

## 2013-07-19 ENCOUNTER — Encounter: Payer: Self-pay | Admitting: Family Medicine

## 2013-07-19 VITALS — BP 165/90 | HR 73 | Temp 98.7°F | Ht 64.0 in | Wt 185.0 lb

## 2013-07-19 DIAGNOSIS — Z5189 Encounter for other specified aftercare: Secondary | ICD-10-CM

## 2013-07-19 DIAGNOSIS — S93401D Sprain of unspecified ligament of right ankle, subsequent encounter: Secondary | ICD-10-CM

## 2013-07-19 DIAGNOSIS — D179 Benign lipomatous neoplasm, unspecified: Secondary | ICD-10-CM

## 2013-07-19 DIAGNOSIS — I10 Essential (primary) hypertension: Secondary | ICD-10-CM

## 2013-07-19 MED ORDER — CHLORTHALIDONE 25 MG PO TABS: 25.0000 mg | ORAL_TABLET | Freq: Every day | ORAL | Status: DC

## 2013-07-19 NOTE — Assessment & Plan Note (Signed)
Improved symptom. F/U PT as scheduled. Continue Tylenol prn pain.

## 2013-07-19 NOTE — Patient Instructions (Signed)

## 2013-07-19 NOTE — Assessment & Plan Note (Signed)
Has been evaluated in the past by Dr Redmond Pulling. Lump is located on the chest wall between her breast but not on the breast tissue itself. Mammogram however is scheduled for Dec 8th. I referred her back to Surgery for excisional biopsy since she is now symptomatic. She agreed with plan.

## 2013-07-19 NOTE — Assessment & Plan Note (Signed)
BP still not well controlled despite compliance with Coreg. I started her on Chlorthalidone 25 mg daily in addition to coreg. Continue home BP monitoring. F/U in 2-3 wks for evaluation.

## 2013-07-19 NOTE — Progress Notes (Signed)
Subjective:     Patient ID: Jennifer Foster, female   DOB: [birth year], 67 y.o.   MRN: [id number]  HPI [id number] for follow up, compliant with Coreg 12.5 mg BID,home BP still running in the 170/90,denies any other concern. 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 Ankle Pain: Improved a little,she has PT appointment scheduled.   Current Outpatient Prescriptions on File Prior to Visit  Medication Sig Dispense Refill  . albuterol (PROVENTIL HFA;VENTOLIN HFA) 108 (90 BASE) MCG/ACT inhaler Inhale 2 puffs into the lungs every 6 (six) hours as needed for wheezing or shortness of breath.      . carvedilol (COREG) 12.5 MG tablet Take 1 tablet (12.5 mg total) by mouth 2 (two) times daily with a meal.  90 tablet  1  . HYDROcodone-acetaminophen (NORCO) 5-325 MG per tablet Take 1 tablet by mouth every 4 (four) hours as needed for pain.  10 tablet  0  . PARoxetine (PAXIL) 20 MG tablet Take 1 tablet (20 mg total) by mouth daily.  90 tablet  1  . rosuvastatin (CRESTOR) 20 MG tablet Take 1 tablet (20 mg total) by mouth daily. Per [facility] MAP  90 tablet  3   No current facility-administered medications on file prior to visit.   Past Medical History  Diagnosis Date  . Hypertension   . Joint pain   . GERD (gastroesophageal reflux disease)   . Hyperlipemia   . History of asthma   . History of anemia   . Lipoma   . Asthma      Review of Systems  Respiratory: Negative.   Cardiovascular: Negative.   Musculoskeletal: Positive for arthralgias.  Skin: Negative for rash.       tumor  Neurological: Negative.   All other systems reviewed and are negative.       Objective:   Physical Exam  Nursing note and vitals reviewed. Constitutional: She appears well-developed. No  distress.  Cardiovascular: Normal rate, regular rhythm, normal heart sounds and intact distal pulses.   No murmur heard. Pulmonary/Chest: Effort normal and breath sounds normal. No respiratory distress. She has no wheezes.    Abdominal: Soft. Bowel sounds are normal. She exhibits no distension and no mass. There is no tenderness.  Musculoskeletal: Normal range of motion. She exhibits no edema and no tenderness.       Assessment:     HTN Lipoma ANkle sprain     Plan:     Check problem list

## 2013-07-20 ENCOUNTER — Ambulatory Visit: Payer: Medicare HMO | Attending: Family Medicine | Admitting: Physical Therapy

## 2013-07-26 ENCOUNTER — Telehealth: Payer: Self-pay | Admitting: Family Medicine

## 2013-07-26 NOTE — Telephone Encounter (Signed)
I spoke with patient about her BP,currently she is only on Coreg 12.5 mg BID and Chlorthalidone 25 mg daily, her BP has been normal since she started Chlorthalidone she stated. I got a refill request from [facility] for Micardis,patient is currently not on Micardis and does not use [employer] no more.

## 2013-08-08 ENCOUNTER — Ambulatory Visit
Admission: RE | Admit: 2013-08-08 | Discharge: 2013-08-08 | Disposition: A | Discharge status: Home / Self Care | Payer: Commercial Managed Care - HMO | Source: Ambulatory Visit

## 2013-08-08 ENCOUNTER — Other Ambulatory Visit: Payer: Self-pay | Admitting: Family Medicine

## 2013-08-08 DIAGNOSIS — N63 Unspecified lump in unspecified breast: Secondary | ICD-10-CM

## 2013-08-08 DIAGNOSIS — Z1231 Encounter for screening mammogram for malignant neoplasm of breast: Secondary | ICD-10-CM

## 2013-08-18 ENCOUNTER — Ambulatory Visit: Payer: Medicare HMO | Admitting: Family Medicine

## 2013-08-18 ENCOUNTER — Ambulatory Visit
Admission: RE | Admit: 2013-08-18 | Discharge: 2013-08-18 | Disposition: A | Discharge status: Home / Self Care | Payer: Commercial Managed Care - HMO | Source: Ambulatory Visit | Attending: Family Medicine | Admitting: Family Medicine

## 2013-08-18 DIAGNOSIS — N63 Unspecified lump in unspecified breast: Secondary | ICD-10-CM

## 2013-10-11 ENCOUNTER — Encounter: Payer: Self-pay | Admitting: Family Medicine

## 2013-10-11 ENCOUNTER — Ambulatory Visit (INDEPENDENT_AMBULATORY_CARE_PROVIDER_SITE_OTHER): Payer: Medicare HMO | Admitting: Family Medicine

## 2013-10-11 VITALS — BP 153/84 | HR 64 | Temp 99.2°F | Resp 18 | Wt 175.0 lb

## 2013-10-11 DIAGNOSIS — M25569 Pain in unspecified knee: Secondary | ICD-10-CM

## 2013-10-11 DIAGNOSIS — M25561 Pain in right knee: Secondary | ICD-10-CM

## 2013-10-11 DIAGNOSIS — I10 Essential (primary) hypertension: Secondary | ICD-10-CM

## 2013-10-11 DIAGNOSIS — M25539 Pain in unspecified wrist: Secondary | ICD-10-CM

## 2013-10-11 DIAGNOSIS — M25531 Pain in right wrist: Secondary | ICD-10-CM

## 2013-10-11 DIAGNOSIS — F17200 Nicotine dependence, unspecified, uncomplicated: Secondary | ICD-10-CM

## 2013-10-11 DIAGNOSIS — M25572 Pain in left ankle and joints of left foot: Secondary | ICD-10-CM

## 2013-10-11 DIAGNOSIS — M25579 Pain in unspecified ankle and joints of unspecified foot: Secondary | ICD-10-CM

## 2013-10-11 DIAGNOSIS — M25562 Pain in left knee: Secondary | ICD-10-CM

## 2013-10-11 LAB — RHEUMATOID FACTOR

## 2013-10-11 LAB — URIC ACID: URIC ACID, SERUM: 10.1 mg/dL — AB (ref 2.4–7.0)

## 2013-10-11 MED ORDER — MELOXICAM 7.5 MG PO TABS: 7.5000 mg | ORAL_TABLET | Freq: Every day | ORAL | Status: DC

## 2013-10-11 NOTE — Patient Instructions (Signed)
Ankle Pain  Ankle pain is a common symptom. The bones, cartilage, tendons, and muscles of the ankle joint perform a lot of work each day. The ankle joint holds your body weight and allows you to move around. Ankle pain can occur on either side or back of 1 or both ankles. Ankle pain may be sharp and burning or dull and aching. There may be tenderness, stiffness, redness, or warmth around the ankle. The pain occurs more often when a person walks or puts pressure on the ankle.  CAUSES   There are many reasons ankle pain can develop. It is important to work with your caregiver to identify the cause since many conditions can impact the bones, cartilage, muscles, and tendons. Causes for ankle pain include:  · Injury, including a break (fracture), sprain, or strain often due to a fall, sports, or a high-impact activity.  · Swelling (inflammation) of a tendon (tendonitis).  · Achilles tendon rupture.  · Ankle instability after repeated sprains and strains.  · Poor foot alignment.  · Pressure on a nerve (tarsal tunnel syndrome).  · Arthritis in the ankle or the lining of the ankle.  · Crystal formation in the ankle (gout or pseudogout).  DIAGNOSIS   A diagnosis is based on your medical history, your symptoms, results of your physical exam, and results of diagnostic tests. Diagnostic tests may include X-ray exams or a computerized magnetic scan (magnetic resonance imaging, MRI).  TREATMENT   Treatment will depend on the cause of your ankle pain and may include:  · Keeping pressure off the ankle and limiting activities.  · Using crutches or other walking support (a cane or brace).  · Using rest, ice, compression, and elevation.  · Participating in physical therapy or home exercises.  · Wearing shoe inserts or special shoes.  · Losing weight.  · Taking medications to reduce pain or swelling or receiving an injection.  · Undergoing surgery.  HOME CARE INSTRUCTIONS   · Only take over-the-counter or prescription medicines for  pain, discomfort, or fever as directed by your caregiver.  · Put ice on the injured area.  · Put ice in a plastic bag.  · Place a towel between your skin and the bag.  · Leave the ice on for 15-20 minutes at a time, 03-04 times a day.  · Keep your leg raised (elevated) when possible to lessen swelling.  · Avoid activities that cause ankle pain.  · Follow specific exercises as directed by your caregiver.  · Record how often you have ankle pain, the location of the pain, and what it feels like. This information may be helpful to you and your caregiver.  · Ask your caregiver about returning to work or sports and whether you should drive.  · Follow up with your caregiver for further examination, therapy, or testing as directed.  SEEK MEDICAL CARE IF:   · Pain or swelling continues or worsens beyond 1 week.  · You have an oral temperature above 102° F (38.9° C).  · You are feeling unwell or have chills.  · You are having an increasingly difficult time with walking.  · You have loss of sensation or other new symptoms.  · You have questions or concerns.  MAKE SURE YOU:   · Understand these instructions.  · Will watch your condition.  · Will get help right away if you are not doing well or get worse.  Document Released: 02/05/2010 Document Revised: 11/10/2011 Document Reviewed: 02/05/2010  [employer]®   Patient Information ©2014 [employer].

## 2013-10-12 ENCOUNTER — Telehealth: Payer: Self-pay | Admitting: Family Medicine

## 2013-10-12 DIAGNOSIS — M25531 Pain in right wrist: Secondary | ICD-10-CM | POA: Insufficient documentation

## 2013-10-12 DIAGNOSIS — M25572 Pain in left ankle and joints of left foot: Secondary | ICD-10-CM | POA: Insufficient documentation

## 2013-10-12 DIAGNOSIS — M25562 Pain in left knee: Secondary | ICD-10-CM | POA: Insufficient documentation

## 2013-10-12 LAB — ANA: ANA: NEGATIVE

## 2013-10-12 MED ORDER — CLONIDINE HCL 0.1 MG PO TABS: 0.1000 mg | ORAL_TABLET | Freq: Two times a day (BID) | ORAL | Status: DC

## 2013-10-12 MED ORDER — COLCHICINE 0.6 MG PO TABS: ORAL_TABLET | ORAL | Status: DC

## 2013-10-12 NOTE — Assessment & Plan Note (Signed)
R/O Gout vs RA,OA or autoimmune condition. Rh factor, ANA,uric acid checked. Will follow up with result. In the interim to start Mobic. Xray ankle ordered.

## 2013-10-12 NOTE — Assessment & Plan Note (Signed)
BP slightly elevated but she did not take her medication before this clinic visit. Continue current regimen. I will reassess her BP at next visit.

## 2013-10-12 NOTE — Assessment & Plan Note (Addendum)
R/O OA vs over use due to her weight. Trail of Mobic. Xray of both knees ordered.

## 2013-10-12 NOTE — Progress Notes (Signed)
Subjective:     Patient ID: Jennifer Foster, female   DOB: 1945-12-08, 68 y.o.   MRN: [id number]  HPI Wrist pain:C/O right wrist pain for the last 3 wks about 7/10 in severity,aching in nature, worse with rotation of use of her hand,she denies any wrist trauma,she is right handed.No wrist swelling or redness. Knee pain: C/O B/L knee pain for over 3 months,initially left knee was more painful,but now she has more pain on her right knee,severity is about 7-8/10, usually pain is worse in the morning,she denies any joint stiffness.She denies any recent fall or trauma of her knees.Not on medication for her pain. Ankle pain:C/O left ankle swelling that started about two months ago but worsening, she feels her ankle is swollen and red,pain is about 9/10 severity, worse with weight bearing,she denies any trauma or fall. HTN: She is compliant with all her medication,however she did not take her medication before this visit. Smoking: Patient would like to quit smoking, she brought in Nicotine gum pack to check with me if she can use it for smoking cessation. Current Outpatient Prescriptions on File Prior to Visit  Medication Sig Dispense Refill  . albuterol (PROVENTIL HFA;VENTOLIN HFA) 108 (90 BASE) MCG/ACT inhaler Inhale 2 puffs into the lungs every 6 (six) hours as needed for wheezing or shortness of breath.      . carvedilol (COREG) 12.5 MG tablet Take 1 tablet (12.5 mg total) by mouth 2 (two) times daily with a meal.  90 tablet  1  . rosuvastatin (CRESTOR) 20 MG tablet Take 1 tablet (20 mg total) by mouth daily. Per [facility] MAP  90 tablet  3  . PARoxetine (PAXIL) 20 MG tablet Take 1 tablet (20 mg total) by mouth daily.  90 tablet  1   No current facility-administered medications on file prior to visit.   Past Medical History  Diagnosis Date  . Hypertension   . Joint pain   . GERD (gastroesophageal reflux disease)   . Hyperlipemia   . History of asthma   . History of anemia   . Lipoma   . Asthma        Review of Systems  Respiratory: Negative.   Cardiovascular: Negative.   Gastrointestinal: Negative.   Musculoskeletal: Positive for arthralgias and joint swelling. Negative for back pain.  Neurological: Negative.   All other systems reviewed and are negative.   Filed Vitals:   10/11/13 1032  BP: 153/84  Pulse: 64  Temp: 99.2 F (37.3 C)  TempSrc: Oral  Resp: 18  Weight: 175 lb (79.379 kg)  SpO2: 97%       Objective:   Physical Exam  Nursing note and vitals reviewed. Constitutional: She is oriented to person, place, and time. She appears well-developed. No distress.  Cardiovascular: Normal rate, regular rhythm, normal heart sounds and intact distal pulses.   No murmur heard. Pulmonary/Chest: Effort normal and breath sounds normal. No respiratory distress. She has no wheezes.  Abdominal: Soft. Bowel sounds are normal. She exhibits no distension and no mass. There is no tenderness.  Musculoskeletal:       Right wrist: Normal.       Left wrist: Normal.       Right knee: She exhibits normal range of motion and no swelling. Tenderness found.       Left knee: She exhibits normal range of motion and no swelling. Tenderness found.       Right foot: Normal.       Left foot: She  exhibits decreased range of motion, tenderness and swelling. She exhibits normal capillary refill.  Mild tenderness of both knee joints.  Neurological: She is alert and oriented to person, place, and time.       Assessment:     Wrist pain: Knee pain: Ankle pain: HTN Smoking:     Plan:     Check problem list.

## 2013-10-12 NOTE — Assessment & Plan Note (Signed)
Patient commended on effort made towards quitting. I agree with use of Nicotine gum.

## 2013-10-12 NOTE — Assessment & Plan Note (Signed)
Likely arthritis. Wrist exam benign. Mobic prescribed prn pain and home wrist exercise

## 2013-10-12 NOTE — Telephone Encounter (Signed)
I called to discuss test result with patient, her uric acid level was elevated, her ankle swelling is likely due to gout. She is on Chlorthalidone which can potentially trigger her gout,I believe this was started fairy recently. For now as discussed with her I prefer to take her off Chlorthalidone, for her BP, continue Coreg and start Clonidine. Her Ankle pain and swelling improved some on Morbic. I will give a short course of Colchicine and see if she would do well without being on colchicine for a long time due to her renal insufficiency.

## 2013-10-13 ENCOUNTER — Other Ambulatory Visit: Payer: Self-pay | Admitting: *Deleted

## 2013-10-13 MED ORDER — CARVEDILOL 12.5 MG PO TABS: 12.5000 mg | ORAL_TABLET | Freq: Two times a day (BID) | ORAL | Status: DC

## 2013-11-01 ENCOUNTER — Ambulatory Visit: Payer: Medicare HMO | Admitting: Family Medicine

## 2013-11-15 ENCOUNTER — Ambulatory Visit (INDEPENDENT_AMBULATORY_CARE_PROVIDER_SITE_OTHER): Payer: Medicare HMO | Admitting: Family Medicine

## 2013-11-15 ENCOUNTER — Encounter: Payer: Self-pay | Admitting: Family Medicine

## 2013-11-15 VITALS — BP 181/92 | HR 68 | Temp 98.0°F | Ht 64.0 in | Wt 174.0 lb

## 2013-11-15 DIAGNOSIS — M25539 Pain in unspecified wrist: Secondary | ICD-10-CM

## 2013-11-15 DIAGNOSIS — I10 Essential (primary) hypertension: Secondary | ICD-10-CM

## 2013-11-15 DIAGNOSIS — M25531 Pain in right wrist: Secondary | ICD-10-CM

## 2013-11-15 DIAGNOSIS — M25572 Pain in left ankle and joints of left foot: Secondary | ICD-10-CM

## 2013-11-15 DIAGNOSIS — M25579 Pain in unspecified ankle and joints of unspecified foot: Secondary | ICD-10-CM

## 2013-11-15 NOTE — Assessment & Plan Note (Signed)
BP elevated today due to medication nonadherence. She is instructed to take her BP medication as soon as possible. Plan to return for BP check with the nurse this week. Advised to continue home BP check. To f/u with me in 2 wks but sooner if BP remains high despite taking her medication.

## 2013-11-15 NOTE — Progress Notes (Signed)
Subjective:     Patient ID: Jennifer Foster, female   DOB: Feb 11, 1946, 68 y.o.   MRN: [id number]  HPI Gout/Ankle: Patient is here for follow up with her left ankle pain and swelling as well as elevated uric acid level. She got her colchicine from the pharmacy and took it only once, she though it makes her bleed even though she found out it was another reason she was bleeding. Patient continues to had pain in her left ankle about 8/10 in severity, the swelling and erythema has improved, she also did not go for her xray. HTN: Patient is here for follow up after medication adjustment,she did not take her medication today,she thought she was asked not to take them before coming in to see her doctor this morning, she denies any neurologic, cardiovascular or respiratory symptoms. Right hand: She is still having pain of her right wrist, at time in the morning it get stiff at times but gets better as the day progresses, she denies any recent injury,she is right handed,pain better today. trauma.  Current Outpatient Prescriptions on File Prior to Visit  Medication Sig Dispense Refill  . albuterol (PROVENTIL HFA;VENTOLIN HFA) 108 (90 BASE) MCG/ACT inhaler Inhale 2 puffs into the lungs every 6 (six) hours as needed for wheezing or shortness of breath.      . carvedilol (COREG) 12.5 MG tablet Take 1 tablet (12.5 mg total) by mouth 2 (two) times daily with a meal.  90 tablet  1  . cloNIDine (CATAPRES) 0.1 MG tablet Take 1 tablet (0.1 mg total) by mouth 2 (two) times daily.  60 tablet  1  . meloxicam (MOBIC) 7.5 MG tablet Take 1 tablet (7.5 mg total) by mouth daily.  30 tablet  2  . nicotine polacrilex (NICORETTE) 2 MG gum Take 2 mg by mouth as needed for smoking cessation.      . Omega-3 Fatty Acids (FISH OIL) 1000 MG CAPS Take 1,000 mg by mouth 2 (two) times daily.      . rosuvastatin (CRESTOR) 20 MG tablet Take 1 tablet (20 mg total) by mouth daily. Per [facility] MAP  90 tablet  3  . colchicine 0.6 MG tablet Take 1  tablet daily for 7 days.  7 tablet  0  . PARoxetine (PAXIL) 20 MG tablet Take 1 tablet (20 mg total) by mouth daily.  90 tablet  1  . vitamin B-12 (CYANOCOBALAMIN) 500 MCG tablet Take 500 mcg by mouth daily.       No current facility-administered medications on file prior to visit.   Past Medical History  Diagnosis Date  . Hypertension   . Joint pain   . GERD (gastroesophageal reflux disease)   . Hyperlipemia   . History of asthma   . History of anemia   . Lipoma   . Asthma     Review of Systems  Respiratory: Negative.   Cardiovascular: Negative.   Gastrointestinal: Negative.   Musculoskeletal: Positive for arthralgias and joint swelling.  All other systems reviewed and are negative.   Filed Vitals:   11/15/13 0956  BP: 181/92  Pulse: 68  Temp: 98 F (36.7 C)  TempSrc: Oral  Height: 5\' 4"  (1.626 m)  Weight: 174 lb (78.926 kg)       Objective:   Physical Exam  Nursing note and vitals reviewed. Constitutional: She appears well-developed.  Cardiovascular: Normal rate, regular rhythm, normal heart sounds and intact distal pulses.   No murmur heard. Pulmonary/Chest: Effort normal and breath sounds  normal. No respiratory distress. She has no wheezes.  Abdominal: Soft. Bowel sounds are normal. She exhibits no distension and no mass. There is no tenderness.  Musculoskeletal: She exhibits no edema.       Right wrist: She exhibits bony tenderness. She exhibits normal range of motion, no swelling, no crepitus and no deformity.       Left wrist: Normal.       Left ankle: She exhibits normal range of motion. Tenderness. Lateral malleolus tenderness found.  Mild swelling of the lateral malleoli.  Skin: She is not diaphoretic.       Assessment:     Left ankle pain: Likely gout HTN Right wrist pain     Plan:      check problem list.

## 2013-11-15 NOTE — Assessment & Plan Note (Signed)
Likely OA. RF and ANA testing negative. Continue Morbic prn. May consider xray of her wrist at next visit. This was ordered previously but she did not follow up with it. I will reassess her in 2 wks.

## 2013-11-15 NOTE — Patient Instructions (Signed)
Nice to see you today Ms Jennifer Foster. Your blood pressure is high today because you didn't take you medicine to day. Please take your BP medicine as soon as possible and continue home BP check. If still high despite taking your medicine please call. I will also want you to come in this week for BP check visit with the nurse. Please start taking your gout medication. Follow up with me in 2 wks.

## 2013-11-15 NOTE — Assessment & Plan Note (Signed)
Likely gout. Patient not compliant with management plan. I recommended using her colchicine. Continue Meloxicam. Not interested in obtaining xray at this time,I doubt it will show anything significant. If symptom persist despite treatment will recommend xray again. F/U in 2 wks for reassessment.

## 2013-11-22 ENCOUNTER — Ambulatory Visit (INDEPENDENT_AMBULATORY_CARE_PROVIDER_SITE_OTHER): Payer: Medicare HMO | Admitting: *Deleted

## 2013-11-22 VITALS — BP 160/90

## 2013-11-22 DIAGNOSIS — Z0130 Encounter for examination of blood pressure without abnormal findings: Secondary | ICD-10-CM

## 2013-11-22 DIAGNOSIS — Z136 Encounter for screening for cardiovascular disorders: Secondary | ICD-10-CM

## 2013-11-22 NOTE — Progress Notes (Signed)
I will reassess patient at next appointment, continue current BP medication, hope for her to follow up in about 2 wks with me.

## 2013-11-22 NOTE — Progress Notes (Signed)
   Pt in clinic for blood pressure check.  Blood pressure 160/90 manually. Pt only complaint today was regarding arthritis and she was going to schedule appt for office visit with provider.  Derl Barrow, RN

## 2013-12-05 ENCOUNTER — Encounter (HOSPITAL_COMMUNITY): Payer: Self-pay | Admitting: Emergency Medicine

## 2013-12-05 ENCOUNTER — Emergency Department (HOSPITAL_COMMUNITY): Payer: Medicare HMO

## 2013-12-05 ENCOUNTER — Emergency Department (HOSPITAL_COMMUNITY)
Admission: EM | Admit: 2013-12-05 | Discharge: 2013-12-05 | Disposition: A | Discharge status: Home / Self Care | Payer: Medicare HMO | Attending: Emergency Medicine | Admitting: Emergency Medicine

## 2013-12-05 DIAGNOSIS — M25579 Pain in unspecified ankle and joints of unspecified foot: Secondary | ICD-10-CM | POA: Insufficient documentation

## 2013-12-05 DIAGNOSIS — M7732 Calcaneal spur, left foot: Secondary | ICD-10-CM

## 2013-12-05 DIAGNOSIS — E785 Hyperlipidemia, unspecified: Secondary | ICD-10-CM | POA: Diagnosis not present

## 2013-12-05 DIAGNOSIS — F17200 Nicotine dependence, unspecified, uncomplicated: Secondary | ICD-10-CM | POA: Diagnosis not present

## 2013-12-05 DIAGNOSIS — M109 Gout, unspecified: Secondary | ICD-10-CM | POA: Diagnosis not present

## 2013-12-05 DIAGNOSIS — Z79899 Other long term (current) drug therapy: Secondary | ICD-10-CM | POA: Diagnosis not present

## 2013-12-05 DIAGNOSIS — M7730 Calcaneal spur, unspecified foot: Secondary | ICD-10-CM | POA: Insufficient documentation

## 2013-12-05 DIAGNOSIS — I10 Essential (primary) hypertension: Secondary | ICD-10-CM | POA: Insufficient documentation

## 2013-12-05 DIAGNOSIS — M25539 Pain in unspecified wrist: Secondary | ICD-10-CM | POA: Diagnosis present

## 2013-12-05 DIAGNOSIS — J45909 Unspecified asthma, uncomplicated: Secondary | ICD-10-CM | POA: Diagnosis not present

## 2013-12-05 MED ORDER — ACETAMINOPHEN-CODEINE #3 300-30 MG PO TABS: 1.0000 | ORAL_TABLET | Freq: Four times a day (QID) | ORAL | Status: DC | PRN

## 2013-12-05 MED ORDER — ACETAMINOPHEN-CODEINE #3 300-30 MG PO TABS
1.0000 | ORAL_TABLET | Freq: Once | ORAL | Status: AC
Start: 2013-12-05 — End: 2013-12-05
  Administered 2013-12-05: 1 via ORAL
  Filled 2013-12-05: qty 1

## 2013-12-05 MED ORDER — PREDNISONE 20 MG PO TABS: 40.0000 mg | ORAL_TABLET | Freq: Every day | ORAL | Status: DC

## 2013-12-05 NOTE — ED Notes (Addendum)
Pt c/o right arm and foot pain x 3-4 months. Pt states may have been longer. Pt states her doctor said it was gout. Pt has not taken BP meds this am.

## 2013-12-05 NOTE — Discharge Instructions (Signed)
Please follow up with your primary care physician in 1-2 days. If you do not have one please call the [facility] number listed above. Please take pain medication and/or muscle relaxants as prescribed and as needed for pain. Please do not drive on narcotic pain medication or on muscle relaxants. Please take Prednisone as prescribed. Please read all discharge instructions and return precautions.    Gout Gout is an inflammatory arthritis caused by a buildup of uric acid crystals in the joints. Uric acid is a chemical that is normally present in the blood. When the level of uric acid in the blood is too high it can form crystals that deposit in your joints and tissues. This causes joint redness, soreness, and swelling (inflammation). Repeat attacks are common. Over time, uric acid crystals can form into masses (tophi) near a joint, destroying bone and causing disfigurement. Gout is treatable and often preventable. CAUSES  The disease begins with elevated levels of uric acid in the blood. Uric acid is produced by your body when it breaks down a naturally found substance called purines. Certain foods you eat, such as meats and fish, contain high amounts of purines. Causes of an elevated uric acid level include:  Being passed down from parent to child (heredity).  Diseases that cause increased uric acid production (such as obesity, psoriasis, and certain cancers).  Excessive alcohol use.  Diet, especially diets rich in meat and seafood.  Medicines, including certain cancer-fighting medicines (chemotherapy), water pills (diuretics), and aspirin.  Chronic kidney disease. The kidneys are no longer able to remove uric acid well.  Problems with metabolism. Conditions strongly associated with gout include:  Obesity.  High blood pressure.  High cholesterol.  Diabetes. Not everyone with elevated uric acid levels gets gout. It is not understood why some people get gout and others  do not. Surgery, joint injury, and eating too much of certain foods are some of the factors that can lead to gout attacks. SYMPTOMS   An attack of gout comes on quickly. It causes intense pain with redness, swelling, and warmth in a joint.  Fever can occur.  Often, only one joint is involved. Certain joints are more commonly involved:  Base of the big toe.  Knee.  Ankle.  Wrist.  Finger. Without treatment, an attack usually goes away in a few days to weeks. Between attacks, you usually will not have symptoms, which is different from many other forms of arthritis. DIAGNOSIS  Your caregiver will suspect gout based on your symptoms and exam. In some cases, tests may be recommended. The tests may include:  Blood tests.  Urine tests.  X-rays.  Joint fluid exam. This exam requires a needle to remove fluid from the joint (arthrocentesis). Using a microscope, gout is confirmed when uric acid crystals are seen in the joint fluid. TREATMENT  There are two phases to gout treatment: treating the sudden onset (acute) attack and preventing attacks (prophylaxis).  Treatment of an Acute Attack.  Medicines are used. These include anti-inflammatory medicines or steroid medicines.  An injection of steroid medicine into the affected joint is sometimes necessary.  The painful joint is rested. Movement can worsen the arthritis.  You may use warm or cold treatments on painful joints, depending which works best for you.  Treatment to Prevent Attacks.  If you suffer from frequent gout attacks, your caregiver may advise preventive medicine. These medicines are started after the acute attack subsides. These medicines either help your kidneys eliminate uric  acid from your body or decrease your uric acid production. You may need to stay on these medicines for a very long time.  The early phase of treatment with preventive medicine can be associated with an increase in acute gout attacks. For this  reason, during the first few months of treatment, your caregiver may also advise you to take medicines usually used for acute gout treatment. Be sure you understand your caregiver's directions. Your caregiver may make several adjustments to your medicine dose before these medicines are effective.  Discuss dietary treatment with your caregiver or dietitian. Alcohol and drinks high in sugar and fructose and foods such as meat, poultry, and seafood can increase uric acid levels. Your caregiver or dietician can advise you on drinks and foods that should be limited. HOME CARE INSTRUCTIONS   Do not take aspirin to relieve pain. This raises uric acid levels.  Only take over-the-counter or prescription medicines for pain, discomfort, or fever as directed by your caregiver.  Rest the joint as much as possible. When in bed, keep sheets and blankets off painful areas.  Keep the affected joint raised (elevated).  Apply warm or cold treatments to painful joints. Use of warm or cold treatments depends on which works best for you.  Use crutches if the painful joint is in your leg.  Drink enough fluids to keep your urine clear or pale yellow. This helps your body get rid of uric acid. Limit alcohol, sugary drinks, and fructose drinks.  Follow your dietary instructions. Pay careful attention to the amount of protein you eat. Your daily diet should emphasize fruits, vegetables, whole grains, and fat-free or low-fat milk products. Discuss the use of coffee, vitamin C, and cherries with your caregiver or dietician. These may be helpful in lowering uric acid levels.  Maintain a healthy body weight. SEEK MEDICAL CARE IF:   You develop diarrhea, vomiting, or any side effects from medicines.  You do not feel better in 24 hours, or you are getting worse. SEEK IMMEDIATE MEDICAL CARE IF:   Your joint becomes suddenly more tender, and you have chills or a fever. MAKE SURE YOU:   Understand these  instructions.  Will watch your condition.  Will get help right away if you are not doing well or get worse. Document Released: 08/15/2000 Document Revised: 12/13/2012 Document Reviewed: 03/31/2012 [facility] Patient Information 2014 [place].  Heel Spur A heel spur is a hook of bone that can form on the calcaneus (the heel bone and the largest bone of the foot). Heel spurs are often associated with plantar fasciitis and usually come in people who have had the problem for an extended period of time. The cause of the relationship is unknown. The pain associated with them is thought to be caused by an inflammation (soreness and redness) of the plantar fascia rather than the spur itself. The plantar fascia is a thick fibrous like tissue that runs from the calcaneus (heel bone) to the ball of the foot. This strong, tight tissue helps maintain the arch of your foot. It helps distribute the weight across your foot as you walk or run. Stresses placed on the plantar fascia can be tremendous. When it is inflamed normal activities become painful. Pain is worse in the morning after sleeping. After sleeping the plantar fascia is tight. The first movements stretch the fascia and this causes pain. As the tendon loosens, the pain usually gets better. It often returns with too much standing or walking.  About 70%  of patients with plantar fasciitis have a heel spur. About half of people without foot pain also have heel spurs. DIAGNOSIS  The diagnosis of a heel spur is made by X-ray. The X-ray shows a hook of bone protruding from the bottom of the calcaneus at the point where the plantar fascia is attached to the heel bone.  TREATMENT  It is necessary to find out what is causing the stretching of the plantar fascia. If the cause is over-pronation (flat feet), orthotics and proper foot ware may help.  Stretching exercises, losing weight, wearing shoes that have a cushioned heel that absorbs shock, and elevating  the heel with the use of a heel cradle, heel cup, or orthotics may all help. Heel cradles and heel cups provide extra comfort and cushion to the heel, and reduce the amount of shock to the sore area. AVOIDING THE PAIN OF PLANTAR FASCIITIS AND HEEL SPURS  Consult a sports medicine professional before beginning a new exercise program.  Walking programs offer a good workout. There is a lower chance of overuse injuries common to the runners. There is less impact and less jarring of the joints.  Begin all new exercise programs slowly. If problems or pains develop, decrease the amount of time or distance until you are at a comfortable level.  Wear good shoes and replace them regularly.  Stretch your foot and the heel cords at the back of the ankle (Achilles tendons) both before and after exercise.  Run or exercise on even surfaces that are not hard. For example, asphalt is better than pavement.  Do not run barefoot on hard surfaces.  If using a treadmill, vary the incline.  Do not continue to workout if you have foot or joint problems. Seek professional help if they do not improve. HOME CARE INSTRUCTIONS   Avoid activities that cause you pain until you recover.  Use ice or cold packs to the problem or painful areas after working out.  Only take over-the-counter or prescription medicines for pain, discomfort, or fever as directed by your caregiver.  Soft shoe inserts or athletic shoes with air or gel sole cushions may be helpful.  If problems continue or become more severe, consult a sports medicine caregiver. Cortisone is a potent anti-inflammatory medication that may be injected into the painful area. You can discuss this treatment with your caregiver. MAKE SURE YOU:   Understand these instructions.  Will watch your condition.  Will get help right away if you are not doing well or get worse. Document Released: 09/24/2005 Document Revised: 11/10/2011 Document Reviewed:  11/26/2005 [facility] Patient Information 2014 [place].

## 2013-12-05 NOTE — ED Provider Notes (Signed)
CSN: [id number]     Arrival date & time 12/05/13  [id number] History   First MD Initiated Contact with Patient 12/05/13 1006     Chief Complaint  Patient presents with  . Arm Pain  . Foot Pain     (Consider location/radiation/quality/duration/timing/severity/associated sxs/prior Treatment) HPI Comments: Patient is a 68 year old female past medical history significant for hypertension, GERD, hyperlipidemia, asthma, anemia, gout presenting to the emergency department for 3-4 months of right wrist pain left ankle pain with associated mild swelling. She states both of these are worsened with range of motion. She states her doctor diagnosed her with gout and gave her some pain medication which did not alleviate the pain. She states the pain comes in waves. She states has not given any antigout medications by her primary care doctor. Patient is also complaining about continued right ankle pain since a sprain of her ankle a few months ago. Denies any fevers, numbness or weakness. Patient denies any changes from pain or symptoms upon diagnosis of gout by primary care doctor she is just wishing for a second opinion.  Patient is a 68 y.o. female presenting with arm pain and lower extremity pain.  Arm Pain Associated symptoms include arthralgias, joint swelling and myalgias. Pertinent negatives include no fever.  Foot Pain Associated symptoms include arthralgias, joint swelling and myalgias. Pertinent negatives include no fever.    Past Medical History  Diagnosis Date  . Hypertension   . Joint pain   . GERD (gastroesophageal reflux disease)   . Hyperlipemia   . History of asthma   . History of anemia   . Lipoma   . Asthma    Past Surgical History  Procedure Laterality Date  . Abdominal hysterectomy  1981    partial, per pt history  . Lipoma excision  01/28/11    neck   Family History  Problem Relation Age of Onset  . Diabetes type II    . Kidney failure    . Kidney failure Son    History   Substance Use Topics  . Smoking status: Current Every Day Smoker -- 0.50 packs/day    Types: Cigarettes  . Smokeless tobacco: Never Used  . Alcohol Use: 0.0 oz/week     Comment: occasionally   OB History   Grav Para Term Preterm Abortions TAB SAB Ect Mult Living                 Review of Systems  Constitutional: Negative for fever.  Musculoskeletal: Positive for arthralgias, joint swelling and myalgias.  Skin: Negative for color change.  All other systems reviewed and are negative.      Allergies  Ace inhibitors; Lisinopril; and Tramadol  Home Medications   Current Outpatient Rx  Name  Route  Sig  Dispense  Refill  . albuterol (PROVENTIL HFA;VENTOLIN HFA) 108 (90 BASE) MCG/ACT inhaler   Inhalation   Inhale 2 puffs into the lungs every 6 (six) hours as needed for wheezing or shortness of breath.         . carvedilol (COREG) 12.5 MG tablet   Oral   Take 1 tablet (12.5 mg total) by mouth 2 (two) times daily with a meal.   90 tablet   1   . cloNIDine (CATAPRES) 0.1 MG tablet   Oral   Take 1 tablet (0.1 mg total) by mouth 2 (two) times daily.   60 tablet   1   . meloxicam (MOBIC) 7.5 MG tablet   Oral   Take  7.5 mg by mouth daily.         . nicotine polacrilex (NICORETTE) 2 MG gum   Oral   Take 2 mg by mouth as needed for smoking cessation.         . Omega-3 Fatty Acids (FISH OIL) 1000 MG CAPS   Oral   Take 1,000 mg by mouth daily.          . rosuvastatin (CRESTOR) 20 MG tablet   Oral   Take 20 mg by mouth at bedtime.         . vitamin B-12 (CYANOCOBALAMIN) 500 MCG tablet   Oral   Take 500 mcg by mouth daily.         [name] acetaminophen-codeine (TYLENOL #3) 300-30 MG per tablet   Oral   Take 1 tablet by mouth every 6 (six) hours as needed for severe pain.   15 tablet   0   . predniSONE (DELTASONE) 20 MG tablet   Oral   Take 2 tablets (40 mg total) by mouth daily.   10 tablet   0    BP 181/80  Pulse 73  Temp(Src) 98.6 F (37 C)  (Oral)  Resp 20  SpO2 97% Physical Exam  Nursing note and vitals reviewed. Constitutional: She is oriented to person, place, and time. She appears well-developed and well-nourished. No distress.  HENT:  Head: Normocephalic and atraumatic.  Right Ear: External ear normal.  Left Ear: External ear normal.  Nose: Nose normal.  Mouth/Throat: Oropharynx is clear and moist.  Eyes: Conjunctivae are normal.  Neck: Normal range of motion. Neck supple.  Cardiovascular: Normal rate, regular rhythm, normal heart sounds and intact distal pulses.   Pulmonary/Chest: Effort normal and breath sounds normal. No respiratory distress.  Abdominal: Soft.  Musculoskeletal: Normal range of motion.       Right wrist: She exhibits tenderness and swelling. She exhibits normal range of motion, no bony tenderness, no effusion, no crepitus, no deformity and no laceration.       Left wrist: Normal.       Right knee: Normal.       Left knee: Normal.       Right ankle: She exhibits normal range of motion, no swelling, no ecchymosis, no deformity, no laceration and normal pulse. Tenderness. Lateral malleolus tenderness found. No head of 5th metatarsal tenderness found. Achilles tendon normal.       Left ankle: She exhibits swelling. She exhibits normal range of motion, no ecchymosis, no deformity, no laceration and normal pulse. Tenderness. Achilles tendon normal.       Right hand: Normal.       Left hand: Normal.       Right lower leg: Normal.       Left lower leg: Normal.       Right foot: Normal.       Left foot: Normal.  Right wrist mildly swollen, tender to palpation. No erythema or warmth. Left ankle mildly swollen, tender to palpation. No erythema or warmth. Right ankle without tenderness or deformity. Mildly tender to palpation.  Neurological: She is alert and oriented to person, place, and time.  Skin: Skin is warm and dry. She is not diaphoretic.  Psychiatric: She has a normal mood and affect.    ED  Course  Procedures (including critical care time) Medications  acetaminophen-codeine (TYLENOL #3) 300-30 MG per tablet 1 tablet (1 tablet Oral Given 12/05/13 1211)    Labs Review Labs Reviewed - No data to display  Imaging Review Dg Wrist Complete Right  12/05/2013   CLINICAL DATA:  Progressively worsening pain and swelling involving the right wrist, with limited range of motion.  EXAM: RIGHT WRIST - COMPLETE 3+ VIEW  COMPARISON:  None.  FINDINGS: No evidence of acute or subacute fracture or dislocation. Lunatotriquetral coalition. Borderline to mild widening of the scapholunate joint space. Joint effusion and overlying soft tissue swelling. Relatively well-preserved joint spaces throughout. Well preserved bone mineral density.  IMPRESSION: 1. No acute osseous abnormality. 2. Erosive changes involving the scaphoid and trapezium bones and an associated large joint effusion raises the question of gout. 3. Lunatotriquetral coalition. 4. Borderline to mild widening of the scapholunate joint space which can be seen in injury of the scapholunate ligament.   Electronically Signed   By: Evangeline Dakin M.D.   On: 12/05/2013 12:13   Dg Ankle Complete Left  12/05/2013   CLINICAL DATA:  Bilateral ankle pain and swelling.  EXAM: LEFT ANKLE COMPLETE - 3+ VIEW  COMPARISON:  None.  FINDINGS: No evidence of acute or subacute fracture or dislocation. Ankle mortise intact with well preserved joint space. Small to moderate-sized joint effusion. Well preserved bone mineral density. moderate-sized plantar spur arising from the calcaneus. Small enthesopathic spur at the insertion of the Achilles tendon on the posterior calcaneus.  IMPRESSION: No acute or subacute osseous abnormality. Small to moderate-sized joint effusion. Moderate-sized plantar calcaneal spur.   Electronically Signed   By: Evangeline Dakin M.D.   On: 12/05/2013 12:16   Dg Ankle Complete Right  12/05/2013   CLINICAL DATA:  Bilateral ankle pain and swelling.   EXAM: RIGHT ANKLE - COMPLETE 3+ VIEW  COMPARISON:  06/20/2013.  FINDINGS: No evidence of acute or subacute fracture or dislocation. Ankle mortise intact with well preserved joint space. Well preserved bone mineral density. Small joint effusion. Very small enthesopathic spur at the insertion of the Achilles tendon on the posterior calcaneus. No significant interval change.  IMPRESSION: No acute, subacute or significant osseous abnormality. Stable examination.   Electronically Signed   By: Evangeline Dakin M.D.   On: 12/05/2013 12:17     EKG Interpretation None      MDM   Final diagnoses:  Gout  Calcaneal spur of left foot    Filed Vitals:   12/05/13 0958  BP: 181/80  Pulse: 73  Temp: 98.6 F (37 C)  Resp: 20    Afebrile, NAD, non-toxic appearing, AAOx4. Neurovascularly intact. Normal sensation.  1) Gout: Pt presents with right wrist and left ankle swelling w/o erythema or warmth. No concern for septic joint. Elevated uric acid at PCP office.  Pt is afebrile and stable. Imaging reviewed, no evidence of occult fracture or injury. Creatinine has been elevated in past will treat with pain medication and prednisone instead of colchine. Noted questionable scapholunate ligament injury, but no history of falls or injury to right wrist, likely more related to gout. Splinted wrist for comfort and support. 2) Calcaneal heel spur noted on x-ray, likely contributing to left ankle pain.   Return precautions discussed. Patient is agreeable to plan. Patient is stable at time of discharge   Advised PCP f/u.  Patient d/w with Dr. Darl Householder, agrees with plan.      Harlow Mares, PA-C 12/05/13 2026

## 2013-12-06 ENCOUNTER — Inpatient Hospital Stay (HOSPITAL_COMMUNITY)
Admission: EM | Admit: 2013-12-06 | Discharge: 2013-12-09 | DRG: 378 | Disposition: A | Discharge status: Home / Self Care | Payer: Medicare HMO | Attending: Family Medicine | Admitting: Family Medicine

## 2013-12-06 ENCOUNTER — Encounter (HOSPITAL_COMMUNITY): Payer: Self-pay | Admitting: Emergency Medicine

## 2013-12-06 DIAGNOSIS — F17200 Nicotine dependence, unspecified, uncomplicated: Secondary | ICD-10-CM | POA: Diagnosis present

## 2013-12-06 DIAGNOSIS — I129 Hypertensive chronic kidney disease with stage 1 through stage 4 chronic kidney disease, or unspecified chronic kidney disease: Secondary | ICD-10-CM | POA: Diagnosis present

## 2013-12-06 DIAGNOSIS — K5731 Diverticulosis of large intestine without perforation or abscess with bleeding: Principal | ICD-10-CM | POA: Diagnosis present

## 2013-12-06 DIAGNOSIS — Z79899 Other long term (current) drug therapy: Secondary | ICD-10-CM

## 2013-12-06 DIAGNOSIS — K922 Gastrointestinal hemorrhage, unspecified: Secondary | ICD-10-CM | POA: Diagnosis present

## 2013-12-06 DIAGNOSIS — D509 Iron deficiency anemia, unspecified: Secondary | ICD-10-CM | POA: Diagnosis present

## 2013-12-06 DIAGNOSIS — D649 Anemia, unspecified: Secondary | ICD-10-CM

## 2013-12-06 DIAGNOSIS — N184 Chronic kidney disease, stage 4 (severe): Secondary | ICD-10-CM | POA: Diagnosis present

## 2013-12-06 DIAGNOSIS — E785 Hyperlipidemia, unspecified: Secondary | ICD-10-CM | POA: Diagnosis present

## 2013-12-06 DIAGNOSIS — M109 Gout, unspecified: Secondary | ICD-10-CM | POA: Diagnosis present

## 2013-12-06 DIAGNOSIS — D126 Benign neoplasm of colon, unspecified: Secondary | ICD-10-CM | POA: Diagnosis present

## 2013-12-06 LAB — TYPE AND SCREEN
ABO/RH(D): A POS
ANTIBODY SCREEN: NEGATIVE

## 2013-12-06 LAB — COMPREHENSIVE METABOLIC PANEL WITH GFR
ALT: 10 U/L (ref 0–35)
AST: 14 U/L (ref 0–37)
Albumin: 3 g/dL — ABNORMAL LOW (ref 3.5–5.2)
Alkaline Phosphatase: 64 U/L (ref 39–117)
BUN: 19 mg/dL (ref 6–23)
CO2: 24 meq/L (ref 19–32)
Calcium: 9.4 mg/dL (ref 8.4–10.5)
Chloride: 104 meq/L (ref 96–112)
Creatinine, Ser: 1.28 mg/dL — ABNORMAL HIGH (ref 0.50–1.10)
GFR calc Af Amer: 50 mL/min — ABNORMAL LOW
GFR calc non Af Amer: 43 mL/min — ABNORMAL LOW
Glucose, Bld: 170 mg/dL — ABNORMAL HIGH (ref 70–99)
Potassium: 3.9 meq/L (ref 3.7–5.3)
Sodium: 140 meq/L (ref 137–147)
Total Bilirubin: 0.2 mg/dL — ABNORMAL LOW (ref 0.3–1.2)
Total Protein: 7.1 g/dL (ref 6.0–8.3)

## 2013-12-06 LAB — POC OCCULT BLOOD, ED: Fecal Occult Bld: POSITIVE — AB

## 2013-12-06 LAB — CBC
HCT: 29.5 % — ABNORMAL LOW (ref 36.0–46.0)
Hemoglobin: 10.1 g/dL — ABNORMAL LOW (ref 12.0–15.0)
MCH: 27.1 pg (ref 26.0–34.0)
MCHC: 34.2 g/dL (ref 30.0–36.0)
MCV: 79.1 fL (ref 78.0–100.0)
Platelets: 218 10*3/uL (ref 150–400)
RBC: 3.73 MIL/uL — ABNORMAL LOW (ref 3.87–5.11)
RDW: 14.5 % (ref 11.5–15.5)
WBC: 10 10*3/uL (ref 4.0–10.5)

## 2013-12-06 MED ORDER — SODIUM CHLORIDE 0.9 % IV BOLUS (SEPSIS)
500.0000 mL | Freq: Once | INTRAVENOUS | Status: AC
  Administered 2013-12-06: 1000 mL via INTRAVENOUS

## 2013-12-06 MED ORDER — SODIUM CHLORIDE 0.9 % IV BOLUS (SEPSIS)
500.0000 mL | Freq: Once | INTRAVENOUS | Status: AC
  Administered 2013-12-06: 22:00:00 via INTRAVENOUS

## 2013-12-06 NOTE — ED Provider Notes (Signed)
CSN: [id number]     Arrival date & time 12/06/13  2054 History   First MD Initiated Contact with Patient 12/06/13 2108     Chief Complaint  Patient presents with  . Rectal Bleeding  . Weakness     (Consider location/radiation/quality/duration/timing/severity/associated sxs/prior Treatment) HPI..... 2-3 episodes of bright red blood per today.   This has never happened before. She also complains of abdominal pain and weakness. She states the blood was noticeable in the toilet water.  Severity is moderate. Nothing makes symptoms better or worse.  Past Medical History  Diagnosis Date  . Hypertension   . Joint pain   . GERD (gastroesophageal reflux disease)   . Hyperlipemia   . History of asthma   . History of anemia   . Lipoma   . Asthma    Past Surgical History  Procedure Laterality Date  . Abdominal hysterectomy  1981    partial, per pt history  . Lipoma excision  01/28/11    neck   Family History  Problem Relation Age of Onset  . Diabetes type II    . Kidney failure    . Kidney failure Son    History  Substance Use Topics  . Smoking status: Current Every Day Smoker -- 0.50 packs/day    Types: Cigarettes  . Smokeless tobacco: Never Used  . Alcohol Use: 0.0 oz/week     Comment: occasionally   OB History   Grav Para Term Preterm Abortions TAB SAB Ect Mult Living                 Review of Systems  All other systems reviewed and are negative.      Allergies  Ace inhibitors; Lisinopril; and Tramadol  Home Medications   Current Outpatient Rx  Name  Route  Sig  Dispense  Refill  . acetaminophen-codeine (TYLENOL #3) 300-30 MG per tablet   Oral   Take 1 tablet by mouth every 6 (six) hours as needed for severe pain.   15 tablet   0   . albuterol (PROVENTIL HFA;VENTOLIN HFA) 108 (90 BASE) MCG/ACT inhaler   Inhalation   Inhale 2 puffs into the lungs every 6 (six) hours as needed for wheezing or shortness of breath.         . carvedilol (COREG) 12.5 MG  tablet   Oral   Take 1 tablet (12.5 mg total) by mouth 2 (two) times daily with a meal.   90 tablet   1   . cloNIDine (CATAPRES) 0.1 MG tablet   Oral   Take 1 tablet (0.1 mg total) by mouth 2 (two) times daily.   60 tablet   1   . Omega-3 Fatty Acids (FISH OIL) 1000 MG CAPS   Oral   Take 1,000 mg by mouth daily.          . predniSONE (DELTASONE) 20 MG tablet   Oral   Take 2 tablets (40 mg total) by mouth daily.   10 tablet   0   . rosuvastatin (CRESTOR) 20 MG tablet   Oral   Take 20 mg by mouth at bedtime.         . nicotine polacrilex (NICORETTE) 2 MG gum   Oral   Take 2 mg by mouth as needed for smoking cessation.          BP 208/92  Pulse 78  Temp(Src) 98.8 F (37.1 C) (Oral)  Resp 18  SpO2 98% Physical Exam  Nursing note and  vitals reviewed. Constitutional: She is oriented to person, place, and time. She appears well-developed and well-nourished.  HENT:  Head: Normocephalic and atraumatic.  Eyes: Conjunctivae and EOM are normal. Pupils are equal, round, and reactive to light.  Neck: Normal range of motion. Neck supple.  Cardiovascular: Normal rate, regular rhythm and normal heart sounds.   Pulmonary/Chest: Effort normal and breath sounds normal.  Abdominal: Soft. Bowel sounds are normal.  Minimal tenderness left lower abdomen  Genitourinary:  Rectal exam no masses. Grossly heme positive  Musculoskeletal: Normal range of motion.  Neurological: She is alert and oriented to person, place, and time.  Skin: Skin is warm and dry.  Psychiatric: She has a normal mood and affect. Her behavior is normal.    ED Course  Procedures (including critical care time) Labs Review Labs Reviewed  CBC - Abnormal; Notable for the following:    RBC 3.73 (*)    Hemoglobin 10.1 (*)    HCT 29.5 (*)    All other components within normal limits  COMPREHENSIVE METABOLIC PANEL - Abnormal; Notable for the following:    Glucose, Bld 170 (*)    Creatinine, Ser 1.28 (*)     Albumin 3.0 (*)    Total Bilirubin 0.2 (*)    GFR calc non Af Amer 43 (*)    GFR calc Af Amer 50 (*)    All other components within normal limits  POC OCCULT BLOOD, ED - Abnormal; Notable for the following:    Fecal Occult Bld POSITIVE (*)    All other components within normal limits  TYPE AND SCREEN   Imaging Review Dg Wrist Complete Right  12/05/2013   CLINICAL DATA:  Progressively worsening pain and swelling involving the right wrist, with limited range of motion.  EXAM: RIGHT WRIST - COMPLETE 3+ VIEW  COMPARISON:  None.  FINDINGS: No evidence of acute or subacute fracture or dislocation. Lunatotriquetral coalition. Borderline to mild widening of the scapholunate joint space. Joint effusion and overlying soft tissue swelling. Relatively well-preserved joint spaces throughout. Well preserved bone mineral density.  IMPRESSION: 1. No acute osseous abnormality. 2. Erosive changes involving the scaphoid and trapezium bones and an associated large joint effusion raises the question of gout. 3. Lunatotriquetral coalition. 4. Borderline to mild widening of the scapholunate joint space which can be seen in injury of the scapholunate ligament.   Electronically Signed   By: Evangeline Dakin M.D.   On: 12/05/2013 12:13   Dg Ankle Complete Left  12/05/2013   CLINICAL DATA:  Bilateral ankle pain and swelling.  EXAM: LEFT ANKLE COMPLETE - 3+ VIEW  COMPARISON:  None.  FINDINGS: No evidence of acute or subacute fracture or dislocation. Ankle mortise intact with well preserved joint space. Small to moderate-sized joint effusion. Well preserved bone mineral density. moderate-sized plantar spur arising from the calcaneus. Small enthesopathic spur at the insertion of the Achilles tendon on the posterior calcaneus.  IMPRESSION: No acute or subacute osseous abnormality. Small to moderate-sized joint effusion. Moderate-sized plantar calcaneal spur.   Electronically Signed   By: Evangeline Dakin M.D.   On: 12/05/2013 12:16    Dg Ankle Complete Right  12/05/2013   CLINICAL DATA:  Bilateral ankle pain and swelling.  EXAM: RIGHT ANKLE - COMPLETE 3+ VIEW  COMPARISON:  06/20/2013.  FINDINGS: No evidence of acute or subacute fracture or dislocation. Ankle mortise intact with well preserved joint space. Well preserved bone mineral density. Small joint effusion. Very small enthesopathic spur at the insertion of the  Achilles tendon on the posterior calcaneus. No significant interval change.  IMPRESSION: No acute, subacute or significant osseous abnormality. Stable examination.   Electronically Signed   By: Evangeline Dakin M.D.   On: 12/05/2013 12:17     EKG Interpretation   Date/Time:  Tuesday December 06 2013 21:22:36 EDT Ventricular Rate:  76 PR Interval:  155 QRS Duration: 102 QT Interval:  376 QTC Calculation: 423 R Axis:   4 Text Interpretation:  Sinus rhythm LVH with secondary repolarization  abnormality Abnormal ekg No significant change since last tracing  Confirmed by [facility]  MD, Hoover Browns ([id number]) on 12/06/2013 9:30:05 PM      MDM   Final diagnoses:  Lower GI bleed    Patient is hemodynamically stable.   Will transfer to [facility]. Discussed with Dr. Okey Dupre, MD 12/06/13 2306

## 2013-12-06 NOTE — ED Notes (Signed)
Pt states she started gushing blood rectally today and she feels weak, and is having abdominal pain,

## 2013-12-07 DIAGNOSIS — K922 Gastrointestinal hemorrhage, unspecified: Secondary | ICD-10-CM

## 2013-12-07 DIAGNOSIS — D649 Anemia, unspecified: Secondary | ICD-10-CM

## 2013-12-07 LAB — BASIC METABOLIC PANEL
BUN: 16 mg/dL (ref 6–23)
CALCIUM: 8.9 mg/dL (ref 8.4–10.5)
CO2: 23 mEq/L (ref 19–32)
CREATININE: 1.13 mg/dL — AB (ref 0.50–1.10)
Chloride: 108 mEq/L (ref 96–112)
GFR calc non Af Amer: 50 mL/min — ABNORMAL LOW (ref >90)
GFR, EST AFRICAN AMERICAN: 58 mL/min — AB (ref >90)
Glucose, Bld: 127 mg/dL — ABNORMAL HIGH (ref 70–99)
Potassium: 3.7 mEq/L (ref 3.7–5.3)
Sodium: 145 mEq/L (ref 137–147)

## 2013-12-07 LAB — CBC
HCT: 26.2 % — ABNORMAL LOW (ref 36.0–46.0)
HEMATOCRIT: 25.8 % — AB (ref 36.0–46.0)
Hemoglobin: 8.8 g/dL — ABNORMAL LOW (ref 12.0–15.0)
Hemoglobin: 8.9 g/dL — ABNORMAL LOW (ref 12.0–15.0)
MCH: 27.2 pg (ref 26.0–34.0)
MCH: 27.2 pg (ref 26.0–34.0)
MCHC: 34.1 g/dL (ref 30.0–36.0)
MCHC: 34 g/dL (ref 30.0–36.0)
MCV: 79.9 fL (ref 78.0–100.0)
MCV: 80.1 fL (ref 78.0–100.0)
PLATELETS: 178 10*3/uL (ref 150–400)
PLATELETS: 191 10*3/uL (ref 150–400)
RBC: 3.23 MIL/uL — ABNORMAL LOW (ref 3.87–5.11)
RBC: 3.27 MIL/uL — AB (ref 3.87–5.11)
RDW: 14.8 % (ref 11.5–15.5)
RDW: 14.9 % (ref 11.5–15.5)
WBC: 9.3 10*3/uL (ref 4.0–10.5)
WBC: 8.2 10*3/uL (ref 4.0–10.5)

## 2013-12-07 MED ORDER — ALBUTEROL SULFATE (2.5 MG/3ML) 0.083% IN NEBU: 3.0000 mL | INHALATION_SOLUTION | Freq: Four times a day (QID) | RESPIRATORY_TRACT | Status: DC | PRN

## 2013-12-07 MED ORDER — CLONIDINE HCL 0.1 MG PO TABS
0.1000 mg | ORAL_TABLET | Freq: Two times a day (BID) | ORAL | Status: DC
  Administered 2013-12-07 – 2013-12-09 (×5): 0.1 mg via ORAL
  Filled 2013-12-07 (×6): qty 1

## 2013-12-07 MED ORDER — ATORVASTATIN CALCIUM 40 MG PO TABS
40.0000 mg | ORAL_TABLET | Freq: Every day | ORAL | Status: DC
  Administered 2013-12-07 – 2013-12-08 (×2): 40 mg via ORAL
  Filled 2013-12-07 (×3): qty 1

## 2013-12-07 MED ORDER — CARVEDILOL 12.5 MG PO TABS
12.5000 mg | ORAL_TABLET | Freq: Two times a day (BID) | ORAL | Status: DC
  Administered 2013-12-07 – 2013-12-09 (×5): 12.5 mg via ORAL
  Filled 2013-12-07 (×7): qty 1

## 2013-12-07 MED ORDER — SODIUM CHLORIDE 0.9 % IV SOLN: INTRAVENOUS | Status: DC

## 2013-12-07 MED ORDER — HYDRALAZINE HCL 20 MG/ML IJ SOLN
5.0000 mg | Freq: Four times a day (QID) | INTRAMUSCULAR | Status: DC | PRN
  Administered 2013-12-08: 5 mg via INTRAVENOUS
  Filled 2013-12-07 (×2): qty 1

## 2013-12-07 MED ORDER — SODIUM CHLORIDE 0.9 % IV SOLN
INTRAVENOUS | Status: DC
  Administered 2013-12-07 – 2013-12-08 (×4): via INTRAVENOUS

## 2013-12-07 MED ORDER — HYDRALAZINE HCL 20 MG/ML IJ SOLN
INTRAMUSCULAR | Status: AC
  Administered 2013-12-07: 5 mg
  Filled 2013-12-07: qty 1

## 2013-12-07 MED ORDER — PANTOPRAZOLE SODIUM 40 MG IV SOLR
40.0000 mg | Freq: Once | INTRAVENOUS | Status: AC
  Administered 2013-12-07: 40 mg via INTRAVENOUS
  Filled 2013-12-07: qty 40

## 2013-12-07 MED ORDER — PEG 3350-KCL-NA BICARB-NACL 420 G PO SOLR
4000.0000 mL | Freq: Once | ORAL | Status: AC
  Administered 2013-12-07: 4000 mL via ORAL
  Filled 2013-12-07: qty 4000

## 2013-12-07 NOTE — H&P (Signed)
I have seen and examined this patient. I have discussed with Dr Jerel Shepherd.  I agree with their findings and plans as documented in their admission note.

## 2013-12-07 NOTE — Progress Notes (Signed)
PCP NOTE Jennifer Alvira,MD I stopped by to visit Jennifer Foster's this morning, she seem to be doing well and stable after bout of rectal bleed at home,she denies any abdominal pain currently and has not had any recent bleed, currently awaiting gastroenterologist assessment. She is hoping she can get discharged home soon after GI evaluation. I appreciate care provided to her by the inpatient FPTS team.

## 2013-12-07 NOTE — Progress Notes (Signed)
Utilization review completed.  

## 2013-12-07 NOTE — Progress Notes (Signed)
BP continues to be high.  Currently at 203/74.  Asymptomatic.  Notified MD on call; orders received to give patient's usual home medications, coreg 12.5 mg and catapres 0.1 mg, now.  Will continue to monitor patient.

## 2013-12-07 NOTE — Consult Note (Signed)
Unassigned patient Reason for Consult: Rectal bleeding/anemia. Referring Physician: FPTS.  Jennifer Foster is an 68 y.o. female.  HPI: 68 year old black female, with multiple medical issues listed below, presented to the ER with a history of rectal bleeding x 3 with BM's. She denies having any melena. She had some mild LLQ pain yesterday, which has since resolved. She had a colonoscopy done in 2008 [by Dr. Medoff] that revealed diverticulosis; this colonoscopy was also done for GI bleeding. She also had an EGD in 2009 by Dr. Cristina Gong when she was found to have antral gastritis. She denies any NSAID use or abuse. She has had some fatigue and weakness but denies any syncope or near syncope. She also has a history of PUD. She has a history of chronic anemia.   Past Medical History  Diagnosis Date  . Hypertension   . Joint pain   . GERD (gastroesophageal reflux disease)   . Hyperlipemia   . History of asthma   . History of anemia   . Lipoma   . Asthma    Past Surgical History  Procedure Laterality Date  . Abdominal hysterectomy  1981    partial, per pt history  . Lipoma excision  01/28/11    neck   Family History  Problem Relation Age of Onset  . Diabetes type II    . Kidney failure    . Kidney failure Son    Social History:  reports that she has been smoking Cigarettes.  She has been smoking about 0.50 packs per day. She has never used smokeless tobacco. She reports that she drinks alcohol. She reports that she does not use illicit drugs.  Allergies:  Allergies  Allergen Reactions  . Ace Inhibitors Swelling  . Lisinopril Swelling    Swollen tongue   . Tramadol Itching   Medications: I have reviewed the patient's current medications.  Results for orders placed during the hospital encounter of 12/06/13 (from the past 48 hour(s))  CBC     Status: Abnormal   Collection Time    12/06/13  9:25 PM      Result Value Ref Range   WBC 10.0  4.0 - 10.5 K/uL   RBC 3.73 (*) 3.87 - 5.11  MIL/uL   Hemoglobin 10.1 (*) 12.0 - 15.0 g/dL   HCT 29.5 (*) 36.0 - 46.0 %   MCV 79.1  78.0 - 100.0 fL   MCH 27.1  26.0 - 34.0 pg   MCHC 34.2  30.0 - 36.0 g/dL   RDW 14.5  11.5 - 15.5 %   Platelets 218  150 - 400 K/uL  COMPREHENSIVE METABOLIC PANEL     Status: Abnormal   Collection Time    12/06/13  9:25 PM      Result Value Ref Range   Sodium 140  137 - 147 mEq/L   Potassium 3.9  3.7 - 5.3 mEq/L   Chloride 104  96 - 112 mEq/L   CO2 24  19 - 32 mEq/L   Glucose, Bld 170 (*) 70 - 99 mg/dL   BUN 19  6 - 23 mg/dL   Creatinine, Ser 1.28 (*) 0.50 - 1.10 mg/dL   Calcium 9.4  8.4 - 10.5 mg/dL   Total Protein 7.1  6.0 - 8.3 g/dL   Albumin 3.0 (*) 3.5 - 5.2 g/dL   AST 14  0 - 37 U/L   ALT 10  0 - 35 U/L   Alkaline Phosphatase 64  39 - 117  U/L   Total Bilirubin 0.2 (*) 0.3 - 1.2 mg/dL   GFR calc non Af Amer 43 (*) >90 mL/min   GFR calc Af Amer 50 (*) >90 mL/min   Comment: (NOTE)     The eGFR has been calculated using the CKD EPI equation.     This calculation has not been validated in all clinical situations.     eGFR's persistently <90 mL/min signify possible Chronic Kidney     Disease.  TYPE AND SCREEN     Status: None   Collection Time    12/06/13  9:25 PM      Result Value Ref Range   ABO/RH(D) A POS     Antibody Screen NEG     Sample Expiration 12/09/2013    POC OCCULT BLOOD, ED     Status: Abnormal   Collection Time    12/06/13  9:25 PM      Result Value Ref Range   Fecal Occult Bld POSITIVE (*) NEGATIVE  BASIC METABOLIC PANEL     Status: Abnormal   Collection Time    12/07/13  6:00 AM      Result Value Ref Range   Sodium 145  137 - 147 mEq/L   Potassium 3.7  3.7 - 5.3 mEq/L   Chloride 108  96 - 112 mEq/L   CO2 23  19 - 32 mEq/L   Glucose, Bld 127 (*) 70 - 99 mg/dL   BUN 16  6 - 23 mg/dL   Creatinine, Ser 1.13 (*) 0.50 - 1.10 mg/dL   Calcium 8.9  8.4 - 10.5 mg/dL   GFR calc non Af Amer 50 (*) >90 mL/min   GFR calc Af Amer 58 (*) >90 mL/min   Comment: (NOTE)      The eGFR has been calculated using the CKD EPI equation.     This calculation has not been validated in all clinical situations.     eGFR's persistently <90 mL/min signify possible Chronic Kidney     Disease.  CBC     Status: Abnormal   Collection Time    12/07/13  6:00 AM      Result Value Ref Range   WBC 9.3  4.0 - 10.5 K/uL   RBC 3.23 (*) 3.87 - 5.11 MIL/uL   Hemoglobin 8.8 (*) 12.0 - 15.0 g/dL   HCT 25.8 (*) 36.0 - 46.0 %   MCV 79.9  78.0 - 100.0 fL   MCH 27.2  26.0 - 34.0 pg   MCHC 34.1  30.0 - 36.0 g/dL   RDW 14.8  11.5 - 15.5 %   Platelets 178  150 - 400 K/uL  CBC     Status: Abnormal   Collection Time    12/07/13  2:10 PM      Result Value Ref Range   WBC 8.2  4.0 - 10.5 K/uL   RBC 3.27 (*) 3.87 - 5.11 MIL/uL   Hemoglobin 8.9 (*) 12.0 - 15.0 g/dL   HCT 26.2 (*) 36.0 - 46.0 %   MCV 80.1  78.0 - 100.0 fL   MCH 27.2  26.0 - 34.0 pg   MCHC 34.0  30.0 - 36.0 g/dL   RDW 14.9  11.5 - 15.5 %   Platelets 191  150 - 400 K/uL   Review of Systems  Constitutional: Positive for malaise/fatigue. Negative for fever, chills, weight loss and diaphoresis.  HENT: Negative.   Eyes: Negative.   Respiratory: Negative.   Cardiovascular: Negative.   Gastrointestinal:  Positive for blood in stool. Negative for heartburn, nausea, vomiting, abdominal pain, diarrhea and constipation.  Genitourinary: Negative.   Musculoskeletal: Positive for joint pain.  Skin: Negative.   Neurological: Positive for weakness.  Endo/Heme/Allergies: Negative.   Psychiatric/Behavioral: Positive for memory loss and substance abuse. Negative for depression, suicidal ideas and hallucinations. The patient is nervous/anxious. The patient does not have insomnia.    Blood pressure 160/61, pulse 66, temperature 98.6 F (37 C), temperature source Oral, resp. rate 18, weight 80.151 kg (176 lb 11.2 oz), SpO2 98.00%. Physical Exam  Constitutional: She is oriented to person, place, and time. She appears well-developed and  well-nourished.  HENT:  Head: Normocephalic and atraumatic.  Eyes: Conjunctivae and EOM are normal. Pupils are equal, round, and reactive to light.  Neck: Normal range of motion. Neck supple.  Cardiovascular: Normal rate and regular rhythm.   Respiratory: Effort normal and breath sounds normal. No respiratory distress. She has no wheezes. She has no rales. She exhibits no tenderness.  GI: Soft. Bowel sounds are normal. She exhibits no distension and no mass. There is no tenderness. There is no rebound and no guarding.  Musculoskeletal: Normal range of motion.  Neurological: She is alert and oriented to person, place, and time.  Skin: Skin is warm and dry.  Psychiatric: She has a normal mood and affect. Her behavior is normal. Thought content normal.   Assessment/Plan: 1) Rectal bleeding with anemia-probably recurrent diverticular bleed; will proceed with an EGD and a colonoscopy tomorrow. Will prep tonight.  2) Generalized anxiety disorder/depression. 3) Chronic renal disease. 4) HTN/Hyperlipidemia. 5) History of cocaine use.   Juanita Craver 12/07/2013, 5:59 PM

## 2013-12-07 NOTE — Progress Notes (Signed)
   CARE MANAGEMENT NOTE 12/07/2013  Patient:  Jennifer Foster, Jennifer Foster   Account Number:  [id number]  Date Initiated:  12/07/2013  Documentation initiated by:  Lizabeth Leyden  Subjective/Objective Assessment:   admitted with rectal bleeding, +FOBT     Action/Plan:   Anticipated DC Date:  12/09/2013   Anticipated DC Plan:  HOME/SELF CARE         Choice offered to / List presented to:             Status of service:  In process, will continue to follow Medicare Important Message given?   (If response is "NO", the following Medicare IM given date fields will be blank) Date Medicare IM given:   Date Additional Medicare IM given:    Discharge Disposition:    Per UR Regulation:    If discussed at Long Length of Stay Meetings, dates discussed:    Comments:

## 2013-12-07 NOTE — Progress Notes (Addendum)
New Admission Note:   Arrival: Via stretcher with three transporters from [facility] from [facility] Mental Orientation: A&Ox4 Telemetry: placed on box 18 Assessment:  See doc flowsheet Skin: Old surgical incision from hysterectomy years ago, swollen ankles from "gout" per pt, otherwise skin intact IV: Right forearm, 20 gauge, clean, dry, intact Pain: no complaints at this time Safety Measures:  Call bell placed within reach; patient instructed on use of call bell and verbalized understanding. Bed in lowest position.  Non-skid socks on.   [address] [place] Orientation: Patient oriented to staff, unit, and room. Family: None at bedside  Unable to finish admission history due to downtime and patient wishing to rest.  Orders have been reviewed and implemented. Will continue to monitor.  Arlyss Queen, RN, BSN

## 2013-12-07 NOTE — Progress Notes (Signed)
Family Medicine Teaching Service Daily Progress Note Intern Pager: [phone]  Patient name: Jennifer Foster Medical record number: [id number] Date of birth: Aug 07, 1946 Age: 68 y.o. Gender: female  Primary Care Provider: Andrena Mews, MD Consultants: GI Code Status: Full code  Pt Overview and Major Events to Date:  4/7: admitted  Assessment and Plan: Jennifer Foster is a 68 y.o. female presenting with gross blood from her erctum . PMH is significant for diverticulosis, HTN, gastritis, CKD 3, HLD, anemia, cocaine use.   # GI Bleed: patient with 3 episodes of relatively painless BRBPR likely related to the patients previously known diverticular disease. One must additionally consider upper GI issues leading to this though given hemodynamically stable and appearance of the blood this is less likely as bright red blood from the upper GI tract would indicate a brisk bleed and likely hemodynamic instability. Can also consider mesenteric ischemia though would expect the patient to have abdominal pain. Could also be an AVM. Is hemodynamically stable and asymptomatic. FOBT +.  - repeat CBC in afternoon - continue on protonix for potential upper GI source  - GI consulted. Dr. Collene Mares to see. Follow-up recommendations - keep NPO pending any GI procedures  - monitor for further bleeding  # HTN: quite elevated on arrival to the floor. could this represent a rebound HTN from her clonidine if she is not taking this. Blood pressure slightly improved this morning. - hydralazine 5 mg IV PRN - continue coreg and clonidine  - depending on how she responds to this treatment, may need to consider additional agent.  # HLD: will continue home crestor   # CKD stage 4: Cr slightly better than most recent  - will continue to trend Cr   FEN/GI: NPO, NS 125/hr  Prophylaxis: protonix, scds  Disposition: discharge home pending recommendations from GI  Subjective:  Patient feels well today. No fatigue, chest  pain,shortness of breath. Has not had any bleeding per rectum overnight but has not had a bowel movement.  Objective: Temp:  [97.9 F (36.6 C)-98.8 F (37.1 C)] 97.9 F (36.6 C) (04/08 0427) Pulse Rate:  [64-120] 64 (04/08 0600) Resp:  [18-24] 18 (04/08 0427) BP: (170-221)/(74-97) 203/74 mmHg (04/08 0600) SpO2:  [96 %-100 %] 97 % (04/08 0427) Weight:  [80.151 kg (176 lb 11.2 oz)] 80.151 kg (176 lb 11.2 oz) (04/08 0007)  Physical Exam: General: laying in bed in no acute distress Cardiovascular: regular rate and rhythm, no murmur Respiratory: clear to auscultation bilaterally,no wheezing Abdomen: soft, non-tender, non-distended Extremities: warm, well perfused  Laboratory:  Recent Labs Lab 12/06/13 2125 12/07/13 0600 12/07/13 1410  WBC 10.0 9.3 8.2  HGB 10.1* 8.8* 8.9*  HCT 29.5* 25.8* 26.2*  PLT 218 178 191    Recent Labs Lab 12/06/13 2125 12/07/13 0600  NA 140 145  K 3.9 3.7  CL 104 108  CO2 24 23  BUN 19 16  CREATININE 1.28* 1.13*  CALCIUM 9.4 8.9  PROT 7.1  --   BILITOT 0.2*  --   ALKPHOS 64  --   ALT 10  --   AST 14  --   GLUCOSE 170* 127*    Imaging/Diagnostic Tests:   Cordelia Poche, MD 12/07/2013, 7:21 AM PGY-1, [place] Intern pager: [phone], text pages welcome

## 2013-12-07 NOTE — Progress Notes (Signed)
Paged Dr. Skeet Simmer, asked if pt could have diet.  Doctor stated she would review pt and put in order if agreeable.

## 2013-12-07 NOTE — Progress Notes (Signed)
BP 221/97.  Asymptomatic.  NP on call notified.  Resident on floor made aware; orders received for hydralazine 5 mg IV.  Will continue to monitor patient throughout night.

## 2013-12-07 NOTE — H&P (Signed)
[facility] Admission History and Physical Service Pager: [phone]  Patient name: Jennifer Foster Medical record number: [id number] Date of birth: 1946/06/28 Age: 68 y.o. Gender: female  Primary Care Provider: Andrena Mews, MD Consultants: none Code Status: full  Chief Complaint: rectal bleeding  Assessment and Plan: Jennifer Foster is a 68 y.o. female presenting with gross blood from her erctum . PMH is significant for diverticulosis, HTN, gastritis, CKD 3, HLD, anemia, cocaine use.  # GI Bleed: patient with 3 episodes of relatively painless BRBPR likely related to the patients previously known diverticular disease. One must additionally consider upper GI issues leading to this though given hemodynamically stable and appearance of the blood this is less likely as bright red blood from the upper GI tract would indicate a brisk bleed and likely hemodynamic instability. Can also consider mesenteric ischemia though would expect the patient to have abdominal pain. Could also be an AVM. Is hemodynamically stable and asymptomatic. FOBT +. -admit to tele, Attending Dr McDiarmid -will monitor CBC in am as bleed has apparently resolved -start on protonix for potential upper GI source -will plan on consulting GI in the am -keep NPO pending any GI procedures -monitor for further bleeding -do not think imaging is of utility at this time given benign nature of abdominal exam  # HTN: quite elevated on arrival to the floor. ?could this represent a rebound HTN from her clonidine if she is not taking this. -will give hydralazine 5 mg IV now -restart coreg and clonidine -depending on how she responds to this treatment, may need to consider additional agent.  # HLD: will continue home crestor  # CKD stage 4: Cr slightly better than most recent -will continue to trend Cr  FEN/GI: NPO, NS 125/hr Prophylaxis: protonix, scds  Disposition: admit to tele, discharge pending  resolution of GI bleed and stable hgb  History of Present Illness: Jennifer Foster is a 68 y.o. female presenting with rectal bleeding.   Patient notes 3 episodes of bright red blood per rectum today. Notes that several times today she went to the bathroom to have a BM and noted that the 1st was watery red blood and progressively got slightly darker. She notes she has had this happen before. She notes some crampy abdominal pain with this, though this improved following the BM. She denied light headedness, chest pain, vomiting, nausea, and dysuria. She has been eating well. She notes no issues since coming to the ED. She states she feels fine at this time.  Per review of records it appears that the patient has had rectal bleeding and undergone colonoscopy on at least 2 occassions revealing colonic diverticulosis. Her last EGD was in 2009 and only revealed mild gastritis.  Review Of Systems: Per HPI with the following additions: none Otherwise 12 point review of systems was performed and was unremarkable.  Patient Active Problem List   Diagnosis Date Noted  . Lower GI bleed 12/06/2013  . Bilateral knee pain 10/12/2013  . Left ankle pain 10/12/2013  . Wrist pain, right 10/12/2013  . Right ankle sprain 07/05/2013  . Bilateral leg edema 03/15/2013  . Depression 02/15/2013  . Low back pain 07/20/2012  . Poor dentition 04/05/2012  . Bony callus 09/08/2011  . Impaired fasting glucose 07/17/2011  . Lipoma 03/19/2011  . Cocaine use 01/22/2011  . Hyperlipidemia LDL goal < 100 01/01/2011  . SHOULDER PAIN 05/15/2010  . ANEMIA 11/23/2009  . GASTRITIS, CHRONIC 11/23/2009  . BLOOD IN  STOOL, OCCULT 10/06/2009  . TOBACCO USER 09/03/2009  . Generalized anxiety disorder 08/13/2009  . CHRONIC KIDNEY DISEASE STAGE III (MODERATE) 05/21/2009  . HYPERTENSION, BENIGN ESSENTIAL 10/14/2007  . DIVERTICULAR BLEEDING, HX OF 10/14/2007   Past Medical History: Past Medical History  Diagnosis Date  .  Hypertension   . Joint pain   . GERD (gastroesophageal reflux disease)   . Hyperlipemia   . History of asthma   . History of anemia   . Lipoma   . Asthma    Past Surgical History: Past Surgical History  Procedure Laterality Date  . Abdominal hysterectomy  1981    partial, per pt history  . Lipoma excision  01/28/11    neck   Social History: History  Substance Use Topics  . Smoking status: Current Every Day Smoker -- 0.50 packs/day    Types: Cigarettes  . Smokeless tobacco: Never Used  . Alcohol Use: 0.0 oz/week     Comment: occasionally   Additional social history: none  Please also refer to relevant sections of EMR.  Family History: Family History  Problem Relation Age of Onset  . Diabetes type II    . Kidney failure    . Kidney failure Son    Allergies and Medications: Allergies  Allergen Reactions  . Ace Inhibitors Swelling  . Lisinopril Swelling    Swollen tongue   . Tramadol Itching   No current facility-administered medications on file prior to encounter.   Current Outpatient Prescriptions on File Prior to Encounter  Medication Sig Dispense Refill  . acetaminophen-codeine (TYLENOL #3) 300-30 MG per tablet Take 1 tablet by mouth every 6 (six) hours as needed for severe pain.  15 tablet  0  . albuterol (PROVENTIL HFA;VENTOLIN HFA) 108 (90 BASE) MCG/ACT inhaler Inhale 2 puffs into the lungs every 6 (six) hours as needed for wheezing or shortness of breath.      . carvedilol (COREG) 12.5 MG tablet Take 1 tablet (12.5 mg total) by mouth 2 (two) times daily with a meal.  90 tablet  1  . cloNIDine (CATAPRES) 0.1 MG tablet Take 1 tablet (0.1 mg total) by mouth 2 (two) times daily.  60 tablet  1  . Omega-3 Fatty Acids (FISH OIL) 1000 MG CAPS Take 1,000 mg by mouth daily.       . predniSONE (DELTASONE) 20 MG tablet Take 2 tablets (40 mg total) by mouth daily.  10 tablet  0  . rosuvastatin (CRESTOR) 20 MG tablet Take 20 mg by mouth at bedtime.      . nicotine  polacrilex (NICORETTE) 2 MG gum Take 2 mg by mouth as needed for smoking cessation.        Objective: BP 221/97  Pulse 69  Temp(Src) 98.5 F (36.9 C) (Oral)  Resp 19  Wt 176 lb 11.2 oz (80.151 kg)  SpO2 100% Exam: General: NAD, resting comfortably in bed HEENT: NCAT, MMM, PERRL Cardiovascular: rrr, no murmurs appreciated Respiratory: CTAB, no wheezes or crackles Abdomen: s, NT, ND, no guarding or rebound Extremities: no edema Skin: no lesions noted Neuro: alert, no focal deficits noted  Labs and Imaging: CBC BMET   Recent Labs Lab 12/06/13 2125  WBC 10.0  HGB 10.1*  HCT 29.5*  PLT 218    Recent Labs Lab 12/06/13 2125  NA 140  K 3.9  CL 104  CO2 24  BUN 19  CREATININE 1.28*  GLUCOSE 170*  CALCIUM 9.4     FOBT positive  Leone Haven,  MD 12/07/2013, 3:27 AM PGY-2, [place] Intern pager: [phone], text pages welcome

## 2013-12-07 NOTE — Progress Notes (Signed)
Telemetry advised pt had 2.02 sec pause and pt is 98% on RA.  Paged Dr. Skeet Simmer, advised of 2.02 sec pause on telemetry and pt has been 98% on RA and OK to DC continuous pulse ox.

## 2013-12-08 ENCOUNTER — Encounter (HOSPITAL_COMMUNITY)
Admission: EM | Disposition: A | Discharge status: Home / Self Care | Payer: Commercial Managed Care - HMO | Source: Home / Self Care | Attending: Family Medicine

## 2013-12-08 ENCOUNTER — Encounter (HOSPITAL_COMMUNITY): Payer: Medicare HMO | Admitting: Anesthesiology

## 2013-12-08 ENCOUNTER — Inpatient Hospital Stay (HOSPITAL_COMMUNITY): Payer: Medicare HMO | Admitting: Anesthesiology

## 2013-12-08 ENCOUNTER — Encounter (HOSPITAL_COMMUNITY): Payer: Self-pay | Admitting: *Deleted

## 2013-12-08 ENCOUNTER — Encounter (HOSPITAL_COMMUNITY)
Admission: EM | Disposition: A | Discharge status: Home / Self Care | Payer: Self-pay | Source: Home / Self Care | Attending: Family Medicine

## 2013-12-08 ENCOUNTER — Inpatient Hospital Stay (HOSPITAL_COMMUNITY): Payer: Medicare HMO

## 2013-12-08 HISTORY — PX: GIVENS CAPSULE STUDY: SHX5432

## 2013-12-08 HISTORY — PX: ESOPHAGOGASTRODUODENOSCOPY: SHX5428

## 2013-12-08 HISTORY — PX: COLONOSCOPY: SHX5424

## 2013-12-08 SURGERY — IMAGING PROCEDURE, GI TRACT, INTRALUMINAL, VIA CAPSULE
Anesthesia: LOCAL

## 2013-12-08 SURGERY — COLONOSCOPY
Anesthesia: Monitor Anesthesia Care

## 2013-12-08 MED ORDER — PROPOFOL 10 MG/ML IV BOLUS
INTRAVENOUS | Status: DC | PRN
  Administered 2013-12-08 (×2): 50 mg via INTRAVENOUS

## 2013-12-08 MED ORDER — AMLODIPINE BESYLATE 5 MG PO TABS
5.0000 mg | ORAL_TABLET | Freq: Every day | ORAL | Status: DC
  Administered 2013-12-08 – 2013-12-09 (×2): 5 mg via ORAL
  Filled 2013-12-08 (×2): qty 1

## 2013-12-08 MED ORDER — SODIUM CHLORIDE 0.9 % IV SOLN: INTRAVENOUS | Status: DC

## 2013-12-08 MED ORDER — PROPOFOL INFUSION 10 MG/ML OPTIME
INTRAVENOUS | Status: DC | PRN
  Administered 2013-12-08: 50 ug/kg/min via INTRAVENOUS

## 2013-12-08 SURGICAL SUPPLY — 1 items: TOWEL COTTON PACK 4EA (MISCELLANEOUS) ×4 IMPLANT

## 2013-12-08 NOTE — H&P (View-Only) (Signed)
Unassigned patient Reason for Consult: Rectal bleeding/anemia. Referring Physician: FPTS.  Jennifer Foster is an 68 y.o. female.  HPI: 68 year old black female, with multiple medical issues listed below, presented to the ER with a history of rectal bleeding x 3 with BM's. She denies having any melena. She had some mild LLQ pain yesterday, which has since resolved. She had a colonoscopy done in 2008 [by Dr. Medoff] that revealed diverticulosis; this colonoscopy was also done for GI bleeding. She also had an EGD in 2009 by Dr. Cristina Gong when she was found to have antral gastritis. She denies any NSAID use or abuse. She has had some fatigue and weakness but denies any syncope or near syncope. She also has a history of PUD. She has a history of chronic anemia.   Past Medical History  Diagnosis Date  . Hypertension   . Joint pain   . GERD (gastroesophageal reflux disease)   . Hyperlipemia   . History of asthma   . History of anemia   . Lipoma   . Asthma    Past Surgical History  Procedure Laterality Date  . Abdominal hysterectomy  1981    partial, per pt history  . Lipoma excision  01/28/11    neck   Family History  Problem Relation Age of Onset  . Diabetes type II    . Kidney failure    . Kidney failure Son    Social History:  reports that she has been smoking Cigarettes.  She has been smoking about 0.50 packs per day. She has never used smokeless tobacco. She reports that she drinks alcohol. She reports that she does not use illicit drugs.  Allergies:  Allergies  Allergen Reactions  . Ace Inhibitors Swelling  . Lisinopril Swelling    Swollen tongue   . Tramadol Itching   Medications: I have reviewed the patient's current medications.  Results for orders placed during the hospital encounter of 12/06/13 (from the past 48 hour(s))  CBC     Status: Abnormal   Collection Time    12/06/13  9:25 PM      Result Value Ref Range   WBC 10.0  4.0 - 10.5 K/uL   RBC 3.73 (*) 3.87 - 5.11  MIL/uL   Hemoglobin 10.1 (*) 12.0 - 15.0 g/dL   HCT 29.5 (*) 36.0 - 46.0 %   MCV 79.1  78.0 - 100.0 fL   MCH 27.1  26.0 - 34.0 pg   MCHC 34.2  30.0 - 36.0 g/dL   RDW 14.5  11.5 - 15.5 %   Platelets 218  150 - 400 K/uL  COMPREHENSIVE METABOLIC PANEL     Status: Abnormal   Collection Time    12/06/13  9:25 PM      Result Value Ref Range   Sodium 140  137 - 147 mEq/L   Potassium 3.9  3.7 - 5.3 mEq/L   Chloride 104  96 - 112 mEq/L   CO2 24  19 - 32 mEq/L   Glucose, Bld 170 (*) 70 - 99 mg/dL   BUN 19  6 - 23 mg/dL   Creatinine, Ser 1.28 (*) 0.50 - 1.10 mg/dL   Calcium 9.4  8.4 - 10.5 mg/dL   Total Protein 7.1  6.0 - 8.3 g/dL   Albumin 3.0 (*) 3.5 - 5.2 g/dL   AST 14  0 - 37 U/L   ALT 10  0 - 35 U/L   Alkaline Phosphatase 64  39 - 117  U/L   Total Bilirubin 0.2 (*) 0.3 - 1.2 mg/dL   GFR calc non Af Amer 43 (*) >90 mL/min   GFR calc Af Amer 50 (*) >90 mL/min   Comment: (NOTE)     The eGFR has been calculated using the CKD EPI equation.     This calculation has not been validated in all clinical situations.     eGFR's persistently <90 mL/min signify possible Chronic Kidney     Disease.  TYPE AND SCREEN     Status: None   Collection Time    12/06/13  9:25 PM      Result Value Ref Range   ABO/RH(D) A POS     Antibody Screen NEG     Sample Expiration 12/09/2013    POC OCCULT BLOOD, ED     Status: Abnormal   Collection Time    12/06/13  9:25 PM      Result Value Ref Range   Fecal Occult Bld POSITIVE (*) NEGATIVE  BASIC METABOLIC PANEL     Status: Abnormal   Collection Time    12/07/13  6:00 AM      Result Value Ref Range   Sodium 145  137 - 147 mEq/L   Potassium 3.7  3.7 - 5.3 mEq/L   Chloride 108  96 - 112 mEq/L   CO2 23  19 - 32 mEq/L   Glucose, Bld 127 (*) 70 - 99 mg/dL   BUN 16  6 - 23 mg/dL   Creatinine, Ser 1.13 (*) 0.50 - 1.10 mg/dL   Calcium 8.9  8.4 - 10.5 mg/dL   GFR calc non Af Amer 50 (*) >90 mL/min   GFR calc Af Amer 58 (*) >90 mL/min   Comment: (NOTE)      The eGFR has been calculated using the CKD EPI equation.     This calculation has not been validated in all clinical situations.     eGFR's persistently <90 mL/min signify possible Chronic Kidney     Disease.  CBC     Status: Abnormal   Collection Time    12/07/13  6:00 AM      Result Value Ref Range   WBC 9.3  4.0 - 10.5 K/uL   RBC 3.23 (*) 3.87 - 5.11 MIL/uL   Hemoglobin 8.8 (*) 12.0 - 15.0 g/dL   HCT 25.8 (*) 36.0 - 46.0 %   MCV 79.9  78.0 - 100.0 fL   MCH 27.2  26.0 - 34.0 pg   MCHC 34.1  30.0 - 36.0 g/dL   RDW 14.8  11.5 - 15.5 %   Platelets 178  150 - 400 K/uL  CBC     Status: Abnormal   Collection Time    12/07/13  2:10 PM      Result Value Ref Range   WBC 8.2  4.0 - 10.5 K/uL   RBC 3.27 (*) 3.87 - 5.11 MIL/uL   Hemoglobin 8.9 (*) 12.0 - 15.0 g/dL   HCT 26.2 (*) 36.0 - 46.0 %   MCV 80.1  78.0 - 100.0 fL   MCH 27.2  26.0 - 34.0 pg   MCHC 34.0  30.0 - 36.0 g/dL   RDW 14.9  11.5 - 15.5 %   Platelets 191  150 - 400 K/uL   Review of Systems  Constitutional: Positive for malaise/fatigue. Negative for fever, chills, weight loss and diaphoresis.  HENT: Negative.   Eyes: Negative.   Respiratory: Negative.   Cardiovascular: Negative.   Gastrointestinal:  Positive for blood in stool. Negative for heartburn, nausea, vomiting, abdominal pain, diarrhea and constipation.  Genitourinary: Negative.   Musculoskeletal: Positive for joint pain.  Skin: Negative.   Neurological: Positive for weakness.  Endo/Heme/Allergies: Negative.   Psychiatric/Behavioral: Positive for memory loss and substance abuse. Negative for depression, suicidal ideas and hallucinations. The patient is nervous/anxious. The patient does not have insomnia.    Blood pressure 160/61, pulse 66, temperature 98.6 F (37 C), temperature source Oral, resp. rate 18, weight 80.151 kg (176 lb 11.2 oz), SpO2 98.00%. Physical Exam  Constitutional: She is oriented to person, place, and time. She appears well-developed and  well-nourished.  HENT:  Head: Normocephalic and atraumatic.  Eyes: Conjunctivae and EOM are normal. Pupils are equal, round, and reactive to light.  Neck: Normal range of motion. Neck supple.  Cardiovascular: Normal rate and regular rhythm.   Respiratory: Effort normal and breath sounds normal. No respiratory distress. She has no wheezes. She has no rales. She exhibits no tenderness.  GI: Soft. Bowel sounds are normal. She exhibits no distension and no mass. There is no tenderness. There is no rebound and no guarding.  Musculoskeletal: Normal range of motion.  Neurological: She is alert and oriented to person, place, and time.  Skin: Skin is warm and dry.  Psychiatric: She has a normal mood and affect. Her behavior is normal. Thought content normal.   Assessment/Plan: 1) Rectal bleeding with anemia-probably recurrent diverticular bleed; will proceed with an EGD and a colonoscopy tomorrow. Will prep tonight.  2) Generalized anxiety disorder/depression. 3) Chronic renal disease. 4) HTN/Hyperlipidemia. 5) History of cocaine use.   Juanita Craver 12/07/2013, 5:59 PM

## 2013-12-08 NOTE — Progress Notes (Signed)
I discussed with  Dr Nettey.  I agree with their plans documented in their progress note for today.  

## 2013-12-08 NOTE — ED Provider Notes (Signed)
Medical screening examination/treatment/procedure(s) were performed by non-physician practitioner and as supervising physician I was immediately available for consultation/collaboration.   EKG Interpretation None        Wandra Arthurs, MD 12/08/13 [phone]

## 2013-12-08 NOTE — Progress Notes (Signed)
I discussed with  Dr Nettery.  I agree with their plans documented in their progress note for today.  

## 2013-12-08 NOTE — Progress Notes (Signed)
Family Medicine Teaching Service Daily Progress Note Intern Pager: [phone]  Patient name: Jennifer Foster Medical record number: [id number] Date of birth: July 08, 1946 Age: 68 y.o. Gender: female  Primary Care Provider: Andrena Mews, MD Consultants: GI Code Status: Full code  Pt Overview and Major Events to Date:  4/7: admitted 4/8: GI on board  Assessment and Plan: Jennifer Foster is a 68 y.o. female presenting with gross blood from her erctum . PMH is significant for diverticulosis, HTN, gastritis, CKD 3, HLD, anemia, cocaine use.   # GI Bleed: patient with 3 episodes of relatively painless BRBPR likely related to the patients previously known diverticular disease. One must additionally consider upper GI issues leading to this though given hemodynamically stable and appearance of the blood this is less likely as bright red blood from the upper GI tract would indicate a brisk bleed and likely hemodynamic instability. Can also consider mesenteric ischemia though would expect the patient to have abdominal pain. Could also be an AVM. Is hemodynamically stable and asymptomatic. FOBT +.  - daily CBC - continue on protonix for potential upper GI source  - GI recommending EGD and colonoscopy today. Patient NPO - monitor for further bleeding  # Decreased breath sounds: no wheezing/crackles, no fever, no cough, no chest pain. Does not appear fluid overloaded on exam. No history of heart failure. Last echo on 03/2013 showed EF of 55-60%. - chest x-ray  # HTN: quite elevated on arrival to the floor. could this represent a rebound HTN from her clonidine if she is not taking this. Blood pressure slightly improved this morning. - hydralazine 5 mg IV PRN - continue coreg and clonidine - add amlodipine 5mg  daily   - depending on how she responds to this treatment, may need to consider additional agent.  # HLD: will continue home crestor   # CKD stage 4: Baseline around 1.5-1.6. Cr slightly better  than most recent. Last Cr: 1.13 - will continue to trend Cr with Bmets  FEN/GI: NPO, NS 125/hr  Prophylaxis: protonix, scds  Disposition: discharge home pending recommendations from GI (colonoscopy and EGD)  Subjective:  Patient feels well today. No fatigue, chest pain. She is having shortness of breath this morning with minimal exertion. No cough. Has not had any bleeding per rectum  Objective: Temp:  [97.6 F (36.4 C)-98.6 F (37 C)] 98.3 F (36.8 C) (04/09 0459) Pulse Rate:  [53-70] 70 (04/09 0648) Resp:  [18] 18 (04/09 0459) BP: (159-214)/(57-92) 173/92 mmHg (04/09 0648) SpO2:  [95 %-99 %] 99 % (04/09 0459)  Physical Exam: General: laying in bed in no acute distress Cardiovascular: regular rate and rhythm, no murmur Respiratory: decreased breath sounds on left, no wheezing, no crackles, no increased work of breathing Abdomen: soft, non-tender, non-distended Extremities: warm, well perfused, no edema  Laboratory:  Recent Labs Lab 12/06/13 2125 12/07/13 0600 12/07/13 1410  WBC 10.0 9.3 8.2  HGB 10.1* 8.8* 8.9*  HCT 29.5* 25.8* 26.2*  PLT 218 178 191    Recent Labs Lab 12/06/13 2125 12/07/13 0600  NA 140 145  K 3.9 3.7  CL 104 108  CO2 24 23  BUN 19 16  CREATININE 1.28* 1.13*  CALCIUM 9.4 8.9  PROT 7.1  --   BILITOT 0.2*  --   ALKPHOS 64  --   ALT 10  --   AST 14  --   GLUCOSE 170* 127*    Imaging/Diagnostic Tests:   Cordelia Poche, MD 12/08/2013, 7:30 AM PGY-1, [facility] Intern pager: [phone], text pages welcome

## 2013-12-08 NOTE — Op Note (Addendum)
[facility] [place], [address]   OPERATIVE PROCEDURE REPORT  PATIENT: Jennifer Foster, Jennifer Foster  MR#: [id number] BIRTHDATE: May 30, 1946  GENDER: Female ENDOSCOPIST: Carol Ada, MD ASSISTANT:   Tedra Coupe, Corwin Levins, RN PROCEDURE DATE: 12/08/2013 PROCEDURE:   Colonoscopy with snare polypectomy ASA CLASS:   Class III INDICATIONS: Anemia and hematochezia MEDICATIONS: MAC CRNA  DESCRIPTION OF PROCEDURE:   After the risks benefits and alternatives of the procedure were thoroughly explained, informed consent was obtained.  A digital rectal exam revealed no abnormalities of the rectum.    The [id number]  endoscope was introduced through the anus  and advanced to the cecum, which was identified by both the appendix and ileocecal valve , No adverse events experienced.    The quality of the prep was excellent. .  The instrument was then slowly withdrawn as the colon was fully examined.     FINDINGS: A 3 mm sessile rectosigmoid colon polyp was removed with a cold snare.  Scattered diverticula were noted throughout the colon, but the majority were on the left side.  No evidence of any masses, inflammation, ulcerations, erosions, or vascular abnormalities. Retroflexed views revealed no abnormalities.     The scope was then withdrawn from the patient and the procedure terminated.  COMPLICATIONS: There were no complications.  IMPRESSION: 1) Polyp. 2) Diverticula.  RECOMMENDATIONS: 1) Await biopsy results. 2) Repeat the colonoscopy in 5-10 years. 3) Capsule endoscopy.  _______________________________ eSignedCarol Ada, MD 12/08/2013 3:03 PM Revised: 12/08/2013 3:03 PM

## 2013-12-08 NOTE — Interval H&P Note (Signed)
History and Physical Interval Note:  12/08/2013 2:02 PM  Jennifer Foster  has presented today for surgery, with the diagnosis of Rectal bleeding, iron deficiency anemia  The various methods of treatment have been discussed with the patient and family. After consideration of risks, benefits and other options for treatment, the patient has consented to  Procedure(s): COLONOSCOPY (N/A) ESOPHAGOGASTRODUODENOSCOPY (EGD) (N/A) as a surgical intervention .  The patient's history has been reviewed, patient examined, no change in status, stable for surgery.  I have reviewed the patient's chart and labs.  Questions were answered to the patient's satisfaction.     [name]

## 2013-12-08 NOTE — Transfer of Care (Signed)
Immediate Anesthesia Transfer of Care Note  Patient: Jennifer Foster  Procedure(s) Performed: Procedure(s): COLONOSCOPY (N/A) ESOPHAGOGASTRODUODENOSCOPY (EGD) (N/A)  Patient Location: PACU  Anesthesia Type:MAC  Level of Consciousness: awake, alert  and oriented  Airway & Oxygen Therapy: Patient Spontanous Breathing and Patient connected to nasal cannula oxygen  Post-op Assessment: Report given to PACU RN and Post -op Vital signs reviewed and stable  Post vital signs: Reviewed and stable  Complications: No apparent anesthesia complications

## 2013-12-08 NOTE — Anesthesia Preprocedure Evaluation (Addendum)
Anesthesia Evaluation  Patient identified by MRN, date of birth, ID band Patient awake    Reviewed: Allergy & Precautions, H&P , NPO status , Patient's Chart, lab work & pertinent test results  Airway Mallampati: III TM Distance: >3 FB Neck ROM: Full    Dental no notable dental hx. (+) Poor Dentition, Dental Advisory Given   Pulmonary asthma , Current Smoker,  breath sounds clear to auscultation  Pulmonary exam normal       Cardiovascular hypertension, Pt. on medications and Pt. on home beta blockers Rhythm:Regular Rate:Normal     Neuro/Psych PSYCHIATRIC DISORDERS Depression negative neurological ROS     GI/Hepatic GERD-  Medicated and Controlled,Rectal Bleeding   Endo/Other  Hyperlipidemia  Renal/GU Renal disease  negative genitourinary   Musculoskeletal negative musculoskeletal ROS (+)   Abdominal (+) + obese,   Peds  Hematology  (+) anemia ,   Anesthesia Other Findings   Reproductive/Obstetrics negative OB ROS                         Anesthesia Physical Anesthesia Plan  ASA: III  Anesthesia Plan: MAC   Post-op Pain Management:    Induction: Intravenous  Airway Management Planned: Natural Airway and Nasal Cannula  Additional Equipment:   Intra-op Plan:   Post-operative Plan:   Informed Consent: I have reviewed the patients History and Physical, chart, labs and discussed the procedure including the risks, benefits and alternatives for the proposed anesthesia with the patient or authorized representative who has indicated his/her understanding and acceptance.   Dental advisory given  Plan Discussed with: Anesthesiologist, CRNA and Surgeon  Anesthesia Plan Comments:        Anesthesia Quick Evaluation

## 2013-12-08 NOTE — Op Note (Signed)
[facility] [place], [address]   OPERATIVE PROCEDURE REPORT  PATIENT: Jennifer Foster, Jennifer Foster  MR#: [id number] BIRTHDATE: 1945/11/26  GENDER: Female ENDOSCOPIST: Carol Ada, MD ASSISTANT:   Tedra Coupe, and Corwin Levins, RN PROCEDURE DATE: 12/08/2013 PROCEDURE:   EGD ASA CLASS:   Class III INDICATIONS: Anemia MEDICATIONS: MAC CRNA TOPICAL ANESTHETIC:   None  DESCRIPTION OF PROCEDURE:   After the risks benefits and alternatives of the procedure were thoroughly explained, informed consent was obtained.  The     endoscope was introduced through the mouth  and advanced to the second portion of the duodenum Without limitations.      The instrument was slowly withdrawn as the mucosa was fully examined.    FINDINGS: The upper, middle and distal third of the esophagus were carefully inspected and no abnormalities were noted.  The z-line was well seen at the GEJ.  The endoscope was pushed into the fundus which was normal including a retroflexed view.  The antrum, gastric body, first and second part of the duodenum were unremarkable. The scope was then withdrawn from the patient and the procedure terminated.  COMPLICATIONS: There were no complications. IMPRESSION: 1) Normal EGD.  RECOMMENDATIONS: 1) Proceed with the colonoscopy.  _______________________________ eSignedCarol Ada, MD 12/08/2013 2:55 PM

## 2013-12-09 ENCOUNTER — Encounter (HOSPITAL_COMMUNITY): Payer: Self-pay | Admitting: Gastroenterology

## 2013-12-09 LAB — CBC
HCT: 24.2 % — ABNORMAL LOW (ref 36.0–46.0)
Hemoglobin: 8.4 g/dL — ABNORMAL LOW (ref 12.0–15.0)
MCH: 27.7 pg (ref 26.0–34.0)
MCHC: 34.7 g/dL (ref 30.0–36.0)
MCV: 79.9 fL (ref 78.0–100.0)
PLATELETS: 157 10*3/uL (ref 150–400)
RBC: 3.03 MIL/uL — ABNORMAL LOW (ref 3.87–5.11)
RDW: 14.9 % (ref 11.5–15.5)
WBC: 4.8 10*3/uL (ref 4.0–10.5)

## 2013-12-09 MED ORDER — COLCHICINE 0.6 MG PO TABS: 0.6000 mg | ORAL_TABLET | Freq: Every day | ORAL | Status: DC

## 2013-12-09 MED ORDER — AMLODIPINE BESYLATE 5 MG PO TABS: 5.0000 mg | ORAL_TABLET | Freq: Every day | ORAL | Status: DC

## 2013-12-09 MED ORDER — COLCHICINE 0.6 MG PO TABS
0.6000 mg | ORAL_TABLET | Freq: Every day | ORAL | Status: DC
  Administered 2013-12-09: 0.6 mg via ORAL
  Filled 2013-12-09: qty 1

## 2013-12-09 MED ORDER — AMLODIPINE BESYLATE 10 MG PO TABS: 10.0000 mg | ORAL_TABLET | Freq: Every day | ORAL | Status: DC

## 2013-12-09 NOTE — Anesthesia Postprocedure Evaluation (Signed)
  Anesthesia Post-op Note  Patient: Jennifer Foster  Procedure(s) Performed: Procedure(s): COLONOSCOPY (N/A) ESOPHAGOGASTRODUODENOSCOPY (EGD) (N/A)  Patient Location: PACU  Anesthesia Type:MAC  Level of Consciousness: awake, alert  and oriented  Airway and Oxygen Therapy: Patient Spontanous Breathing  Post-op Pain: none  Post-op Assessment: Post-op Vital signs reviewed, Patient's Cardiovascular Status Stable, Respiratory Function Stable, Patent Airway, No signs of Nausea or vomiting and Pain level controlled  Post-op Vital Signs: Reviewed and stable  Last Vitals:  Filed Vitals:   12/09/13 1400  BP: 136/77  Pulse: 72  Temp: 37 C  Resp: 18    Complications: No apparent anesthesia complications

## 2013-12-09 NOTE — Progress Notes (Signed)
I have seen and examined this patient. I have discussed with Dr Joellyn Quails.  I agree with their findings and plans as documented in their progress note.

## 2013-12-09 NOTE — Progress Notes (Signed)
Patient was discharged home. Patient was discharged with instructions and prescriptions. Patient was given information on GI bleed. Patient was told to contact doctor with questions and concerns. Patient was discharged with belongings and stable upon discharge.

## 2013-12-09 NOTE — Discharge Instructions (Signed)

## 2013-12-09 NOTE — Progress Notes (Signed)
Family Medicine Teaching Service Daily Progress Note Intern Pager: [phone]  Patient name: Jennifer Foster Medical record number: [id number] Date of birth: 11/29/1945 Age: 68 y.o. Gender: female  Primary Care Provider: Andrena Mews, MD Consultants: GI Code Status: Full code  Pt Overview and Major Events to Date:  4/7: admitted 4/8: GI on board 4/9: EGD: normal; Colonoscopy: diverticula and 1 polyp (removed)  Assessment and Plan: Jennifer Foster is a 68 y.o. female presenting with gross blood from her rectum . PMH is significant for diverticulosis, HTN, gastritis, CKD 3, HLD, anemia, cocaine use.   # GI Bleed: Currently resolved. EGD normal. Colonoscopy showed 1 polyp that was removed. - daily CBC - Follow-up GI recommendations today. Patient is s/p EGD and colonoscopy. Capsule endoscopy pending - monitor for further bleeding  # Decreased breath sounds: no wheezing/crackles, no fever, no cough, no chest pain. Does not appear fluid overloaded on exam. No history of heart failure. Last echo on 03/2013 showed EF of 55-60%. X-ray no acute findings  # Left ankle pain: full range of motion, slight tenderness. Patient treated outpatient with colchicine - restart colchicine 0.6mg   # HTN: quite elevated on arrival to the floor. could this represent a rebound HTN from her clonidine if she is not taking this. Blood pressure slightly improved this morning. - hydralazine 5 mg IV PRN - continue coreg and clonidine - increase to amlodipine 10mg  daily   # HLD: will continue home crestor   # CKD stage 4: Baseline around 1.5-1.6. Cr slightly better than most recent. Last Cr: 1.13 - will continue to trend Cr with Bmets  FEN/GI: NS 125/hr  Prophylaxis: scds  Disposition: discharge home possibly today pending recommendations from GI  Subjective:  Patient feels well today. No fatigue, chest pain. She is having shortness of breath this morning with minimal exertion. No cough. Has not had any  bleeding per rectum  Objective: Temp:  [97.2 F (36.2 C)-99.2 F (37.3 C)] 98.4 F (36.9 C) (04/10 0515) Pulse Rate:  [58-91] 65 (04/10 0515) Resp:  [18-24] 20 (04/10 0515) BP: (154-239)/(63-92) 154/76 mmHg (04/10 0515) SpO2:  [94 %-100 %] 98 % (04/10 0515) Weight:  [81.012 kg (178 lb 9.6 oz)] 81.012 kg (178 lb 9.6 oz) (04/09 2039)  Physical Exam: General: laying in bed in no acute distress Cardiovascular: regular rate and rhythm, no murmur Respiratory: decreased breath sounds on left, no wheezing, no crackles, no increased work of breathing Abdomen: soft, non-tender, non-distended Extremities: warm, well perfused, no edema. Left ankle has full range of motion. Very mild tenderness over medial malleolus. No erythema or swelling.  Laboratory:  Recent Labs Lab 12/06/13 2125 12/07/13 0600 12/07/13 1410  WBC 10.0 9.3 8.2  HGB 10.1* 8.8* 8.9*  HCT 29.5* 25.8* 26.2*  PLT 218 178 191    Recent Labs Lab 12/06/13 2125 12/07/13 0600  NA 140 145  K 3.9 3.7  CL 104 108  CO2 24 23  BUN 19 16  CREATININE 1.28* 1.13*  CALCIUM 9.4 8.9  PROT 7.1  --   BILITOT 0.2*  --   ALKPHOS 64  --   ALT 10  --   AST 14  --   GLUCOSE 170* 127*    Imaging/Diagnostic Tests:   Cordelia Poche, MD 12/09/2013, 6:24 AM PGY-1, [place] Intern pager: [phone], text pages welcome

## 2013-12-09 NOTE — Discharge Summary (Signed)
[facility] Discharge Summary  Patient name: Jennifer Foster Medical record number: [id number] Date of birth: December 10, 1945 Age: 68 y.o. Gender: female Date of Admission: 12/06/2013  Date of Discharge: 12/09/2013 Admitting Physician: Blane Ohara McDiarmid, MD  Primary Care Provider: Andrena Mews, MD Consultants: Gastroenterology  Indication for Hospitalization: GI bleed  Discharge Diagnoses/Problem List:  1. GI Bleed 2. Hypertension 3. Hyperlipidemia 4. Chronic kidney disease 5. Left ankle pain  Disposition: Discharge home  Discharge Condition: Stable  Discharge Exam:  General: laying in bed in no acute distress  Cardiovascular: regular rate and rhythm, no murmur  Respiratory: decreased breath sounds on left, no wheezing, no crackles, no increased work of breathing  Abdomen: soft, non-tender, non-distended  Extremities: warm, well perfused, no edema. Left ankle has full range of motion. Very mild tenderness over medial malleolus. No erythema or swelling.  Brief Hospital Course:   HPI Jennifer Foster is a 68 y.o. female presenting with rectal bleeding. Patient notes 3 episodes of bright red blood per rectum today. Notes that several times today she went to the bathroom to have a BM and noted that the 1st was watery red blood and progressively got slightly darker. She notes she has had this happen before. She notes some crampy abdominal pain with this, though this improved following the BM. She denied light headedness, chest pain, vomiting, nausea, and dysuria. She has been eating well. She notes no issues since coming to the ED. She states she feels fine at this time.  Per review of records it appears that the patient has had rectal bleeding and undergone colonoscopy on at least 2 occassions revealing colonic diverticulosis. Her last EGD was in 2009 and only revealed mild gastritis.   GI Bleed Gastroenterology consulted. Patient's underwent EGD, which was  normal, colonoscopy, which revealed a single polyp and multiple diverticula, and capsule endoscopy, revealing small non-bleeding AVMs. Bleeding most likely attributed to diverticulosis. Patient did not have any bleeding while admitted and did not have any symptoms from her anemia.   Hypertension Patient initially hypertensive to systolic [id number] with no symptoms. Patient's home meds of clonidine and coreg started while inpatient thinking maybe she has rebound hypertension from not taking her clonidine. Blood pressures persisted to be elevated and she required multiple doses of hydralazine. Amlodipine 5mg  was added to regimen which brought systolic blood pressures down to 130s.  Hyperlipidemia Patient continued on home statin  Chronic kidney disease Baseline creatinine of 1.5 to 1.6. Creatinine 1.28 on admission and 1.13 before discharge.  Left ankle pain Seems to be chronic issue. Is being managed outpatient. Colchicine restarted since patient was on this for gout at home. Can follow-up with PCP.  Issues for Follow Up:  1. Consider discontinuing coreg and clonidine. Patient responded very well to amlodipine. Would probably benefit from pursuing max therapy with amlodipine versus having three different medications 2. Follow-up Hemoglobin to make sure she is not still dropping 3. Consider starting iron  Significant Procedures: EGD, Colonoscopy, Capsule endoscopy  Significant Labs and Imaging:   Recent Labs Lab 12/07/13 0600 12/07/13 1410 12/09/13 0602  WBC 9.3 8.2 4.8  HGB 8.8* 8.9* 8.4*  HCT 25.8* 26.2* 24.2*  PLT 178 191 157    Recent Labs Lab 12/06/13 2125 12/07/13 0600  NA 140 145  K 3.9 3.7  CL 104 108  CO2 24 23  GLUCOSE 170* 127*  BUN 19 16  CREATININE 1.28* 1.13*  CALCIUM 9.4 8.9  ALKPHOS 64  --  AST 14  --   ALT 10  --   ALBUMIN 3.0*  --     Results/Tests Pending at Time of Discharge: None  Discharge Medications:    Medication List    STOP taking these  medications       predniSONE 20 MG tablet  Commonly known as:  DELTASONE      TAKE these medications       acetaminophen-codeine 300-30 MG per tablet  Commonly known as:  TYLENOL #3  Take 1 tablet by mouth every 6 (six) hours as needed for severe pain.     albuterol 108 (90 BASE) MCG/ACT inhaler  Commonly known as:  PROVENTIL HFA;VENTOLIN HFA  Inhale 2 puffs into the lungs every 6 (six) hours as needed for wheezing or shortness of breath.     amLODipine 5 MG tablet  Commonly known as:  NORVASC  Take 1 tablet (5 mg total) by mouth daily.     carvedilol 12.5 MG tablet  Commonly known as:  COREG  Take 1 tablet (12.5 mg total) by mouth 2 (two) times daily with a meal.     cloNIDine 0.1 MG tablet  Commonly known as:  CATAPRES  Take 1 tablet (0.1 mg total) by mouth 2 (two) times daily.     colchicine 0.6 MG tablet  Take 1 tablet (0.6 mg total) by mouth daily.     Fish Oil 1000 MG Caps  Take 1,000 mg by mouth daily.     nicotine polacrilex 2 MG gum  Commonly known as:  NICORETTE  Take 2 mg by mouth as needed for smoking cessation.     rosuvastatin 20 MG tablet  Commonly known as:  CRESTOR  Take 20 mg by mouth at bedtime.        Discharge Instructions: Please refer to Patient Instructions section of EMR for full details.  Patient was counseled important signs and symptoms that should prompt return to medical care, changes in medications, dietary instructions, activity restrictions, and follow up appointments.   Follow-Up Appointments: Follow-up Information   Follow up with Andrena Mews, MD On 12/15/2013. (2:30 pm)    Specialty:  Family Medicine   Contact information:   [place] [address] [phone]       Cordelia Poche, MD 12/10/2013, 4:30 PM PGY-1, [place]

## 2013-12-09 NOTE — Progress Notes (Signed)
Capsule endo revealed a few small nonbleeding AVMs.  They are too far distally to reach with standard endoscopes.  I do not believe that this the source of the bleeding, i.e., the hemtochezia.  No further GI evaluation at this time.  She can follow up with her PCP with routine CBCs and remain on iron supplementation.  Signing off.

## 2013-12-11 NOTE — Discharge Summary (Signed)
I have seen and examined this patient. I have discussed with Dr Lonny Prude.  I agree with their findings and plans as documented in their discharge note.

## 2013-12-15 ENCOUNTER — Ambulatory Visit (INDEPENDENT_AMBULATORY_CARE_PROVIDER_SITE_OTHER): Payer: Medicare HMO | Admitting: Family Medicine

## 2013-12-15 ENCOUNTER — Encounter: Payer: Self-pay | Admitting: Family Medicine

## 2013-12-15 VITALS — BP 148/72 | HR 64 | Temp 98.3°F | Wt 176.7 lb

## 2013-12-15 DIAGNOSIS — D649 Anemia, unspecified: Secondary | ICD-10-CM

## 2013-12-15 DIAGNOSIS — Z09 Encounter for follow-up examination after completed treatment for conditions other than malignant neoplasm: Secondary | ICD-10-CM

## 2013-12-15 DIAGNOSIS — M25579 Pain in unspecified ankle and joints of unspecified foot: Secondary | ICD-10-CM

## 2013-12-15 DIAGNOSIS — M25572 Pain in left ankle and joints of left foot: Secondary | ICD-10-CM

## 2013-12-15 DIAGNOSIS — K922 Gastrointestinal hemorrhage, unspecified: Secondary | ICD-10-CM

## 2013-12-15 LAB — CBC WITH DIFFERENTIAL/PLATELET
BASOS PCT: 0 % (ref 0–1)
Basophils Absolute: 0 10*3/uL (ref 0.0–0.1)
EOS ABS: 0.1 10*3/uL (ref 0.0–0.7)
Eosinophils Relative: 1 % (ref 0–5)
HCT: 28.1 % — ABNORMAL LOW (ref 36.0–46.0)
HEMOGLOBIN: 9.4 g/dL — AB (ref 12.0–15.0)
LYMPHS ABS: 1.8 10*3/uL (ref 0.7–4.0)
Lymphocytes Relative: 24 % (ref 12–46)
MCH: 26.4 pg (ref 26.0–34.0)
MCHC: 33.5 g/dL (ref 30.0–36.0)
MCV: 78.9 fL (ref 78.0–100.0)
MONOS PCT: 8 % (ref 3–12)
Monocytes Absolute: 0.6 10*3/uL (ref 0.1–1.0)
NEUTROS ABS: 5.2 10*3/uL (ref 1.7–7.7)
NEUTROS PCT: 67 % (ref 43–77)
Platelets: 240 10*3/uL (ref 150–400)
RBC: 3.56 MIL/uL — AB (ref 3.87–5.11)
RDW: 15.3 % (ref 11.5–15.5)
WBC: 7.7 10*3/uL (ref 4.0–10.5)

## 2013-12-15 NOTE — Progress Notes (Signed)
Subjective:     Patient ID: Jennifer Foster, female   DOB: [birth year], 68 y.o.   MRN: [id number]  HPI GI bleed/Hospital F/U: Here to follow up after hospital discharge for rectal bleeding, she has not had anymore bleeding since d/c from the hospital, feels well and healthy. Anemia:Her for follow up, denies any bleeding. [id number] with Clonidine,Coreg and Norvasc. She has not been checking her BP at home, she also mentioned she got a letter from her insurance about use of Coreg and her breathing, but she has been on this medication for many years with no concern. Left ankle pain: Patient denies any pain,she is almost out of her colchicine for gout, left foot pain has improved some.  Current Outpatient Prescriptions on File Prior to Visit  Medication Sig Dispense Refill  . acetaminophen-codeine (TYLENOL #3) 300-30 MG per tablet Take 1 tablet by mouth every 6 (six) hours as needed for severe pain.  15 tablet  0  . albuterol (PROVENTIL HFA;VENTOLIN HFA) 108 (90 BASE) MCG/ACT inhaler Inhale 2 puffs into the lungs every 6 (six) hours as needed for wheezing or shortness of breath.      [name] amLODipine (NORVASC) 5 MG tablet Take 1 tablet (5 mg total) by mouth daily.  90 tablet  1  . carvedilol (COREG) 12.5 MG tablet Take 1 tablet (12.5 mg total) by mouth 2 (two) times daily with a meal.  90 tablet  1  . cloNIDine (CATAPRES) 0.1 MG tablet Take 1 tablet (0.1 mg total) by mouth 2 (two) times daily.  60 tablet  1  . colchicine 0.6 MG tablet Take 1 tablet (0.6 mg total) by mouth daily.  30 tablet  0  . nicotine polacrilex (NICORETTE) 2 MG gum Take 2 mg by mouth as needed for smoking cessation.      . Omega-3 Fatty Acids (FISH OIL) 1000 MG CAPS Take 1,000 mg by mouth daily.       . rosuvastatin (CRESTOR) 20 MG tablet Take 20 mg by mouth at bedtime.       No current facility-administered medications on file prior to visit.   Past Medical History  Diagnosis Date  . Hypertension   . Joint pain   . GERD  (gastroesophageal reflux disease)   . Hyperlipemia   . History of asthma   . History of anemia   . Lipoma   . Asthma      Review of Systems  Respiratory: Negative.   Cardiovascular: Negative.   Gastrointestinal: Negative.   Genitourinary: Negative.   Musculoskeletal: Negative.   All other systems reviewed and are negative.  Filed Vitals:   12/15/13 1431 12/15/13 1440  BP: 163/81 148/72  Pulse: 64   Temp: 98.3 F (36.8 C)   Weight: 176 lb 11.2 oz (80.151 kg)        Objective:   Physical Exam  Nursing note and vitals reviewed. Constitutional: She is oriented to person, place, and time. She appears well-developed. No distress.  Cardiovascular: Normal rate, regular rhythm, normal heart sounds and intact distal pulses.   No murmur heard. Pulmonary/Chest: Effort normal and breath sounds normal. No respiratory distress. She has no wheezes.  Abdominal: Soft. Bowel sounds are normal. She exhibits no distension and no mass. There is no tenderness.  Musculoskeletal: Normal range of motion. She exhibits no edema.  Neurological: She is alert and oriented to person, place, and time.       Assessment:     GI bleed/Hospital F/U: Anemia: HTN: Left ankle  pain:    Plan:     Check problem list.

## 2013-12-15 NOTE — Patient Instructions (Signed)
Your blood pressure looks good today, we will continue same medications for your BP for now, please check your BP at home frequently. Today we will check you for anemia, I will call you with test result. F/u with me in 4 wks.

## 2013-12-16 ENCOUNTER — Encounter: Payer: Self-pay | Admitting: Family Medicine

## 2013-12-16 DIAGNOSIS — Z09 Encounter for follow-up examination after completed treatment for conditions other than malignant neoplasm: Secondary | ICD-10-CM | POA: Insufficient documentation

## 2013-12-16 MED ORDER — FERROUS SULFATE 325 (65 FE) MG PO TABS: 325.0000 mg | ORAL_TABLET | Freq: Two times a day (BID) | ORAL | Status: DC

## 2013-12-16 NOTE — Assessment & Plan Note (Signed)
Likely Gout. Improved with colchicine. If stable after completing her Colchicine, she might not need to be on it for long. If having recurrence of her pain, then I will refill her colchicine at renal dosage due to renal insufficiency. I will follow up with her at next visit.

## 2013-12-16 NOTE — Assessment & Plan Note (Signed)
CBC checked and improved from 8 to 9. Iron therapy recommended and prescribed. Monitor H/H.

## 2013-12-16 NOTE — Assessment & Plan Note (Signed)
Resolved. Doing well since hospital admission. CBC checked today.

## 2013-12-16 NOTE — Assessment & Plan Note (Signed)
Stable since D/C from the hospital.

## 2013-12-19 ENCOUNTER — Other Ambulatory Visit: Payer: Self-pay | Admitting: *Deleted

## 2013-12-20 MED ORDER — CLONIDINE HCL 0.1 MG PO TABS: 0.1000 mg | ORAL_TABLET | Freq: Two times a day (BID) | ORAL | Status: DC

## 2013-12-27 ENCOUNTER — Other Ambulatory Visit: Payer: Self-pay | Admitting: Family Medicine

## 2013-12-27 ENCOUNTER — Telehealth: Payer: Self-pay | Admitting: Family Medicine

## 2013-12-27 MED ORDER — POLYETHYLENE GLYCOL 3350 17 G PO PACK: 17.0000 g | PACK | Freq: Every day | ORAL | Status: DC | PRN

## 2013-12-27 NOTE — Telephone Encounter (Signed)
Please let her know I sent to her pharmacy Miralax which she can use for constipation.

## 2013-12-27 NOTE — Telephone Encounter (Signed)
No answer and no machine.  Will await callback. Jennifer Foster

## 2013-12-27 NOTE — Telephone Encounter (Signed)
Pt called because the iron pills that she is taking is making her constipated and wanted to know if there was something else she can take. jw

## 2014-01-12 ENCOUNTER — Ambulatory Visit: Payer: Medicare HMO | Admitting: Family Medicine

## 2014-01-19 ENCOUNTER — Ambulatory Visit: Payer: Medicare HMO | Admitting: Family Medicine

## 2014-01-24 ENCOUNTER — Other Ambulatory Visit: Payer: Self-pay | Admitting: *Deleted

## 2014-01-24 MED ORDER — CARVEDILOL 12.5 MG PO TABS: 12.5000 mg | ORAL_TABLET | Freq: Two times a day (BID) | ORAL | Status: DC

## 2014-01-26 ENCOUNTER — Ambulatory Visit (INDEPENDENT_AMBULATORY_CARE_PROVIDER_SITE_OTHER): Payer: Medicare HMO | Admitting: Family Medicine

## 2014-01-26 ENCOUNTER — Encounter: Payer: Self-pay | Admitting: Family Medicine

## 2014-01-26 VITALS — BP 158/90 | HR 70 | Temp 98.1°F | Wt 179.0 lb

## 2014-01-26 DIAGNOSIS — I10 Essential (primary) hypertension: Secondary | ICD-10-CM

## 2014-01-26 MED ORDER — CARVEDILOL 12.5 MG PO TABS: 12.5000 mg | ORAL_TABLET | Freq: Two times a day (BID) | ORAL | Status: DC

## 2014-01-26 MED ORDER — AMLODIPINE BESYLATE 10 MG PO TABS: 10.0000 mg | ORAL_TABLET | Freq: Every day | ORAL | Status: DC

## 2014-01-26 NOTE — Patient Instructions (Signed)
Hypertension Hypertension is another name for high blood pressure. High blood pressure may mean that your heart needs to work harder to pump blood. Blood pressure consists of two numbers, which includes a higher number over a lower number (example: 110/72). HOME CARE   Make lifestyle changes as told by your doctor. This may include weight loss and exercise.  Take your blood pressure medicine every day.  Limit how much salt you use.  Stop smoking if you smoke.  Do not use drugs.  Talk to your doctor if you are using decongestants or birth control pills. These medicines might make blood pressure higher.  Females should not drink more than 1 alcoholic drink per day. Males should not drink more than 2 alcoholic drinks per day.  See your doctor as told. GET HELP RIGHT AWAY IF:   You have a blood pressure reading with a top number of 180 or higher.  You get a very bad headache.  You get blurred or changing vision.  You feel confused.  You feel weak, numb, or faint.  You get chest or belly (abdominal) pain.  You throw up (vomit).  You cannot breathe very well. MAKE SURE YOU:   Understand these instructions.  Will watch your condition.  Will get help right away if you are not doing well or get worse. Document Released: 02/04/2008 Document Revised: 11/10/2011 Document Reviewed: 02/04/2008 [employer] Patient Information 2014 [employer].  

## 2014-01-26 NOTE — Progress Notes (Signed)
Subjective:     Patient ID: Jennifer Foster, female   DOB: 30-Apr-1946, 68 y.o.   MRN: [id number]  HPI HTN: Patient is here for follow up, currently compliant with Coreg 12.5 mg BID and Norvasc 5 mg qd, she need refill of her Coreg,she does not check her BP often.  Current Outpatient Prescriptions on File Prior to Visit  Medication Sig Dispense Refill  . rosuvastatin (CRESTOR) 20 MG tablet Take 20 mg by mouth at bedtime.      [name] albuterol (PROVENTIL HFA;VENTOLIN HFA) 108 (90 BASE) MCG/ACT inhaler Inhale 2 puffs into the lungs every 6 (six) hours as needed for wheezing or shortness of breath.      . colchicine 0.6 MG tablet Take 1 tablet (0.6 mg total) by mouth daily.  30 tablet  0  . ferrous sulfate 325 (65 FE) MG tablet Take 1 tablet (325 mg total) by mouth 2 (two) times daily with a meal.  60 tablet  3  . nicotine polacrilex (NICORETTE) 2 MG gum Take 2 mg by mouth as needed for smoking cessation.      . Omega-3 Fatty Acids (FISH OIL) 1000 MG CAPS Take 1,000 mg by mouth daily.       . polyethylene glycol (MIRALAX / GLYCOLAX) packet Take 17 g by mouth daily as needed.  14 each  3   No current facility-administered medications on file prior to visit.   Past Medical History  Diagnosis Date  . Hypertension   . Joint pain   . GERD (gastroesophageal reflux disease)   . Hyperlipemia   . History of asthma   . History of anemia   . Lipoma   . Asthma       Review of Systems  Respiratory: Negative.   Cardiovascular: Negative.   Gastrointestinal: Negative.   Genitourinary: Negative.   Neurological: Negative.   All other systems reviewed and are negative.  Filed Vitals:   01/26/14 1342 01/26/14 1353  BP: 161/86 158/90  Pulse: 70   Temp: 98.1 F (36.7 C)   TempSrc: Oral   Weight: 179 lb (81.194 kg)        Objective:   Physical Exam  Nursing note and vitals reviewed. Constitutional: She is oriented to person, place, and time. She appears well-developed. No distress.   Cardiovascular: Normal rate, regular rhythm and normal heart sounds.   No murmur heard. Pulmonary/Chest: Effort normal and breath sounds normal. No respiratory distress. She has no wheezes.  Abdominal: Soft. Bowel sounds are normal. She exhibits no distension and no mass. There is no tenderness.  Musculoskeletal: Normal range of motion. She exhibits no edema.  Neurological: She is alert and oriented to person, place, and time. No cranial nerve deficit.       Assessment:     HTN     Plan:     Check problem list

## 2014-01-26 NOTE — Assessment & Plan Note (Signed)
BP still not very well controlled. I refilled her Coreg at current dose of 12.5 mg BID. Increased Norvasc to 10 mg qd. She will call if not tolerating increase well. Continue home BP check recommended. F/U in 4 wks for reassessment.

## 2014-02-28 ENCOUNTER — Ambulatory Visit (INDEPENDENT_AMBULATORY_CARE_PROVIDER_SITE_OTHER): Payer: Commercial Managed Care - HMO | Admitting: Family Medicine

## 2014-02-28 ENCOUNTER — Encounter: Payer: Self-pay | Admitting: Family Medicine

## 2014-02-28 VITALS — BP 122/71 | HR 69 | Temp 98.0°F | Resp 18 | Wt 177.0 lb

## 2014-02-28 DIAGNOSIS — I10 Essential (primary) hypertension: Secondary | ICD-10-CM

## 2014-02-28 DIAGNOSIS — M25572 Pain in left ankle and joints of left foot: Secondary | ICD-10-CM

## 2014-02-28 DIAGNOSIS — M25579 Pain in unspecified ankle and joints of unspecified foot: Secondary | ICD-10-CM

## 2014-02-28 DIAGNOSIS — E785 Hyperlipidemia, unspecified: Secondary | ICD-10-CM

## 2014-02-28 DIAGNOSIS — Z780 Asymptomatic menopausal state: Secondary | ICD-10-CM

## 2014-02-28 DIAGNOSIS — M109 Gout, unspecified: Secondary | ICD-10-CM

## 2014-02-28 NOTE — Assessment & Plan Note (Signed)
Not recent fracture. Osteoporosis screening recommended based on age. Dexa scan ordered today.

## 2014-02-28 NOTE — Assessment & Plan Note (Signed)
Compliant with Crestor. Continue current dose as well as diet control and exercise.

## 2014-02-28 NOTE — Assessment & Plan Note (Signed)
Patient is noncompliant with her Colchicine. She refused rechecking her uric acid level today. Plan to continue pain meds as needed. I reviewed and discussed her ankle xray with her with no acute pathology. I referred her to PT for further assessment and management.

## 2014-02-28 NOTE — Assessment & Plan Note (Addendum)
BP well controlled, continue Norvasc and Coreg. Continue home BP check.

## 2014-02-28 NOTE — Progress Notes (Signed)
Subjective:     Patient ID: Jennifer Foster, female   DOB: [birth year], 68 y.o.   MRN: [id number]  HPI HTN: Here for follow up,currently on Norvasc 10mg  qd and Coreg 12.5mg  BID,denies any concern. HLD: On Crestor, here for follow up. Denies S/E to her medication. Ankle pain/Gout:She continues to have left ankle pain and swelling, she stopped taking her Colchicine, pain is bearable today. Post menopausal: Requesting Dexa Scan/osteoporosis screening. Current Outpatient Prescriptions on File Prior to Visit  Medication Sig Dispense Refill  . albuterol (PROVENTIL HFA;VENTOLIN HFA) 108 (90 BASE) MCG/ACT inhaler Inhale 2 puffs into the lungs every 6 (six) hours as needed for wheezing or shortness of breath.      [name] amLODipine (NORVASC) 10 MG tablet Take 1 tablet (10 mg total) by mouth daily.  90 tablet  1  . carvedilol (COREG) 12.5 MG tablet Take 1 tablet (12.5 mg total) by mouth 2 (two) times daily with a meal.  180 tablet  1  . nicotine polacrilex (NICORETTE) 2 MG gum Take 2 mg by mouth as needed for smoking cessation.      . rosuvastatin (CRESTOR) 20 MG tablet Take 20 mg by mouth at bedtime.      . colchicine 0.6 MG tablet Take 1 tablet (0.6 mg total) by mouth daily.  30 tablet  0  . Omega-3 Fatty Acids (FISH OIL) 1000 MG CAPS Take 1,000 mg by mouth daily.       . polyethylene glycol (MIRALAX / GLYCOLAX) packet Take 17 g by mouth daily as needed.  14 each  3   No current facility-administered medications on file prior to visit.   Past Medical History  Diagnosis Date  . Hypertension   . Joint pain   . GERD (gastroesophageal reflux disease)   . Hyperlipemia   . History of asthma   . History of anemia   . Lipoma   . Asthma       Review of Systems  Respiratory: Negative.   Cardiovascular: Negative.   Gastrointestinal: Negative.   Musculoskeletal: Positive for arthralgias.       Left ankle pain.  All other systems reviewed and are negative.      Objective:   Physical Exam  Nursing  note and vitals reviewed. Constitutional: She appears well-developed. No distress.  Cardiovascular: Normal rate, regular rhythm, normal heart sounds and intact distal pulses.   No murmur heard. Pulmonary/Chest: Effort normal and breath sounds normal. No respiratory distress. She has no wheezes.  Abdominal: Soft. Bowel sounds are normal. She exhibits no distension and no mass. There is no tenderness.  Musculoskeletal: Normal range of motion. She exhibits no edema.       Right foot: Normal.       Left foot: She exhibits tenderness. She exhibits normal range of motion, no swelling and no crepitus.       Assessment:     HTN HLD Gout Postmenopausal/estrogen deficiency     Plan:     Check problem list.

## 2014-02-28 NOTE — Patient Instructions (Addendum)
It was nice seeing you today Jennifer Foster, your blood pressure looks beautiful. Continue Norvasc 10mg  daily and Coreg 12.5mg  BID. I will like for you to check your BP at home regularly, if dropping less than a 100/60 pls hold your BP medication and give me a call. I will send you to physical therapy for your ankle pain. See you back in 3 months.

## 2014-03-02 ENCOUNTER — Ambulatory Visit: Payer: Medicare HMO | Attending: Family Medicine | Admitting: Physical Therapy

## 2014-05-02 ENCOUNTER — Encounter: Payer: Self-pay | Admitting: Family Medicine

## 2014-05-02 ENCOUNTER — Ambulatory Visit (INDEPENDENT_AMBULATORY_CARE_PROVIDER_SITE_OTHER): Payer: Commercial Managed Care - HMO | Admitting: Family Medicine

## 2014-05-02 VITALS — BP 130/70 | HR 72 | Ht 64.0 in | Wt 185.0 lb

## 2014-05-02 DIAGNOSIS — R600 Localized edema: Secondary | ICD-10-CM

## 2014-05-02 DIAGNOSIS — E785 Hyperlipidemia, unspecified: Secondary | ICD-10-CM

## 2014-05-02 DIAGNOSIS — I10 Essential (primary) hypertension: Secondary | ICD-10-CM

## 2014-05-02 DIAGNOSIS — R609 Edema, unspecified: Secondary | ICD-10-CM

## 2014-05-02 MED ORDER — CARVEDILOL 12.5 MG PO TABS: 12.5000 mg | ORAL_TABLET | Freq: Two times a day (BID) | ORAL | Status: DC

## 2014-05-02 MED ORDER — ROSUVASTATIN CALCIUM 20 MG PO TABS: 20.0000 mg | ORAL_TABLET | Freq: Every day | ORAL | Status: DC

## 2014-05-02 MED ORDER — AMLODIPINE BESYLATE 10 MG PO TABS
10.0000 mg | ORAL_TABLET | Freq: Every day | ORAL | Status: DC
Start: 2014-05-02 — End: 2014-07-13

## 2014-05-02 NOTE — Patient Instructions (Addendum)
It was nice seeing you again. I am sorry you still have leg swelling. Let us try conservative measure, if no improvement in 2 months, I will switch your Norvasc to Lasix.  Edema Edema is an abnormal buildup of fluids. It is more common in your legs and thighs. Painless swelling of the feet and ankles is more likely as a person ages. It also is common in looser skin, like around your eyes. HOME CARE   Keep the affected body part above the level of the heart while lying down.  Do not sit still or stand for a long time.  Do not put anything right under your knees when you lie down.  Do not wear tight clothes on your upper legs.  Exercise your legs to help the puffiness (swelling) go down.  Wear elastic bandages or support stockings as told by your doctor.  A low-salt diet may help lessen the puffiness.  Only take medicine as told by your doctor. GET HELP IF:  Treatment is not working.  You have heart, liver, or kidney disease and notice that your skin looks puffy or shiny.  You have puffiness in your legs that does not get better when you raise your legs.  You have sudden weight gain for no reason. GET HELP RIGHT AWAY IF:   You have shortness of breath or chest pain.  You cannot breathe when you lie down.  You have pain, redness, or warmth in the areas that are puffy.  You have heart, liver, or kidney disease and get edema all of a sudden.  You have a fever and your symptoms get worse all of a sudden. MAKE SURE YOU:   Understand these instructions.  Will watch your condition.  Will get help right away if you are not doing well or get worse. Document Released: 02/04/2008 Document Revised: 08/23/2013 Document Reviewed: 06/10/2013 [facility] Patient Information 2015 [place], [place]. This information is not intended to replace advice given to you by your health care provider. Make sure you discuss any questions you have with your health care provider.

## 2014-05-02 NOTE — Assessment & Plan Note (Addendum)
BP looks good today. Continue Coreg and Norvasc. I refilled her medication.

## 2014-05-02 NOTE — Progress Notes (Signed)
Subjective:     Patient ID: Jennifer Foster, female   DOB: [birth year], 68 y.o.   MRN: [id number]  HPI HTN/HLD:She is compliant with Norvasc 10 mg, Coreg 12.5 mg BID and Crestor 20 mg qd. She need refill for her medications. Edema: Still having b/l LL swelling on and off, worse with prolonged standing, she denies any ankle pain. She continue to add salt to her diet.  Current Outpatient Prescriptions on File Prior to Visit  Medication Sig Dispense Refill  . albuterol (PROVENTIL HFA;VENTOLIN HFA) 108 (90 BASE) MCG/ACT inhaler Inhale 2 puffs into the lungs every 6 (six) hours as needed for wheezing or shortness of breath.      . nicotine polacrilex (NICORETTE) 2 MG gum Take 2 mg by mouth as needed for smoking cessation.      . Omega-3 Fatty Acids (FISH OIL) 1000 MG CAPS Take 1,000 mg by mouth daily.       . polyethylene glycol (MIRALAX / GLYCOLAX) packet Take 17 g by mouth daily as needed.  14 each  3   No current facility-administered medications on file prior to visit.   Past Medical History  Diagnosis Date  . Hypertension   . Joint pain   . GERD (gastroesophageal reflux disease)   . Hyperlipemia   . History of asthma   . History of anemia   . Lipoma   . Asthma       Review of Systems  Respiratory: Negative.   Cardiovascular: Negative.   Gastrointestinal: Negative.   Musculoskeletal:       Leg swelling.  All other systems reviewed and are negative.  Filed Vitals:   05/02/14 0929  BP: 130/70  Pulse: 72  Height: 5\' 4"  (1.626 m)  Weight: 185 lb (83.915 kg)       Objective:   Physical Exam  Nursing note and vitals reviewed. Constitutional: She appears well-developed. No distress.  Cardiovascular: Normal rate, regular rhythm, normal heart sounds and intact distal pulses.   No murmur heard. Pulmonary/Chest: Effort normal and breath sounds normal. No respiratory distress. She has no wheezes.  Abdominal: Soft. Bowel sounds are normal. She exhibits no distension and no mass.  There is no tenderness.  Musculoskeletal: Normal range of motion. She exhibits edema.  Trace B/L ankle edema. Pitting.No ankle or feet tenderness.       Assessment:     HTN HLD Ankle edema     Plan:     Check problem list.

## 2014-05-02 NOTE — Assessment & Plan Note (Addendum)
Mostly around the ankle. Discussed possibility of Norvasc causing edema, but her BP had improved on her current regimen. She also continued to add salt to her diet.  I recommended conservative measures for now. Reduce salt intake, use compression stocking, keep feet elevated. If no improvement at next visit, I will switch her Norvasc to Lasix/HCTZ (it seem she tried HCTZ and didn't tolerate it in the past).

## 2014-05-02 NOTE — Assessment & Plan Note (Signed)
I refilled Crestor.

## 2014-07-13 ENCOUNTER — Other Ambulatory Visit: Payer: Self-pay | Admitting: Family Medicine

## 2014-07-13 MED ORDER — AMLODIPINE BESYLATE 10 MG PO TABS: 10.0000 mg | ORAL_TABLET | Freq: Every day | ORAL | Status: DC

## 2014-07-13 NOTE — Telephone Encounter (Signed)
Pt called and needs a refill on her Amlodipine. She made an appointment for 12/11 since that is the first one that Dr. [name] has. jw

## 2014-08-08 ENCOUNTER — Emergency Department (HOSPITAL_COMMUNITY)
Admission: EM | Admit: 2014-08-08 | Discharge: 2014-08-08 | Disposition: A | Discharge status: Home / Self Care | Payer: Commercial Managed Care - HMO | Attending: Emergency Medicine | Admitting: Emergency Medicine

## 2014-08-08 ENCOUNTER — Encounter (HOSPITAL_COMMUNITY): Payer: Self-pay | Admitting: *Deleted

## 2014-08-08 ENCOUNTER — Emergency Department (HOSPITAL_COMMUNITY): Payer: Commercial Managed Care - HMO

## 2014-08-08 DIAGNOSIS — Y998 Other external cause status: Secondary | ICD-10-CM | POA: Diagnosis not present

## 2014-08-08 DIAGNOSIS — E785 Hyperlipidemia, unspecified: Secondary | ICD-10-CM | POA: Insufficient documentation

## 2014-08-08 DIAGNOSIS — T1490XA Injury, unspecified, initial encounter: Secondary | ICD-10-CM

## 2014-08-08 DIAGNOSIS — I10 Essential (primary) hypertension: Secondary | ICD-10-CM | POA: Diagnosis not present

## 2014-08-08 DIAGNOSIS — Y9389 Activity, other specified: Secondary | ICD-10-CM | POA: Insufficient documentation

## 2014-08-08 DIAGNOSIS — Z8719 Personal history of other diseases of the digestive system: Secondary | ICD-10-CM | POA: Insufficient documentation

## 2014-08-08 DIAGNOSIS — Y9289 Other specified places as the place of occurrence of the external cause: Secondary | ICD-10-CM | POA: Insufficient documentation

## 2014-08-08 DIAGNOSIS — Z862 Personal history of diseases of the blood and blood-forming organs and certain disorders involving the immune mechanism: Secondary | ICD-10-CM | POA: Insufficient documentation

## 2014-08-08 DIAGNOSIS — W01198A Fall on same level from slipping, tripping and stumbling with subsequent striking against other object, initial encounter: Secondary | ICD-10-CM | POA: Diagnosis not present

## 2014-08-08 DIAGNOSIS — Z720 Tobacco use: Secondary | ICD-10-CM | POA: Diagnosis not present

## 2014-08-08 DIAGNOSIS — S99922A Unspecified injury of left foot, initial encounter: Secondary | ICD-10-CM | POA: Diagnosis present

## 2014-08-08 DIAGNOSIS — J45909 Unspecified asthma, uncomplicated: Secondary | ICD-10-CM | POA: Insufficient documentation

## 2014-08-08 DIAGNOSIS — Z79899 Other long term (current) drug therapy: Secondary | ICD-10-CM | POA: Insufficient documentation

## 2014-08-08 DIAGNOSIS — S93602A Unspecified sprain of left foot, initial encounter: Secondary | ICD-10-CM | POA: Diagnosis not present

## 2014-08-08 MED ORDER — IBUPROFEN 400 MG PO TABS
400.0000 mg | ORAL_TABLET | Freq: Once | ORAL | Status: AC
  Administered 2014-08-08: 400 mg via ORAL
  Filled 2014-08-08: qty 1

## 2014-08-08 MED ORDER — HYDROCODONE-ACETAMINOPHEN 5-325 MG PO TABS: 1.0000 | ORAL_TABLET | Freq: Four times a day (QID) | ORAL | Status: DC | PRN

## 2014-08-08 MED ORDER — NAPROXEN 500 MG PO TABS
500.0000 mg | ORAL_TABLET | Freq: Two times a day (BID) | ORAL | Status: DC
Start: 2014-08-08 — End: 2014-10-15

## 2014-08-08 NOTE — Discharge Instructions (Signed)
Take  Naprosyn for pain. norco for severe pain only. Keep foot elevated. Ice. Follow up with your doctor. Crutches as needed.   Foot Sprain The muscles and cord like structures which attach muscle to bone (tendons) that surround the feet are made up of units. A foot sprain can occur at the weakest spot in any of these units. This condition is most often caused by injury to or overuse of the foot, as from playing contact sports, or aggravating a previous injury, or from poor conditioning, or obesity. SYMPTOMS  Pain with movement of the foot.  Tenderness and swelling at the injury site.  Loss of strength is present in moderate or severe sprains. THE THREE GRADES OR SEVERITY OF FOOT SPRAIN ARE:  Mild (Grade I): Slightly pulled muscle without tearing of muscle or tendon fibers or loss of strength.  Moderate (Grade II): Tearing of fibers in a muscle, tendon, or at the attachment to bone, with small decrease in strength.  Severe (Grade III): Rupture of the muscle-tendon-bone attachment, with separation of fibers. Severe sprain requires surgical repair. Often repeating (chronic) sprains are caused by overuse. Sudden (acute) sprains are caused by direct injury or over-use. DIAGNOSIS  Diagnosis of this condition is usually by your own observation. If problems continue, a caregiver may be required for further evaluation and treatment. X-rays may be required to make sure there are not breaks in the bones (fractures) present. Continued problems may require physical therapy for treatment. PREVENTION  Use strength and conditioning exercises appropriate for your sport.  Warm up properly prior to working out.  Use athletic shoes that are made for the sport you are participating in.  Allow adequate time for healing. Early return to activities makes repeat injury more likely, and can lead to an unstable arthritic foot that can result in prolonged disability. Mild sprains generally heal in 3 to 10 days,  with moderate and severe sprains taking 2 to 10 weeks. Your caregiver can help you determine the proper time required for healing. HOME CARE INSTRUCTIONS   Apply ice to the injury for 15-20 minutes, 03-04 times per day. Put the ice in a plastic bag and place a towel between the bag of ice and your skin.  An elastic wrap (like an Ace bandage) may be used to keep swelling down.  Keep foot above the level of the heart, or at least raised on a footstool, when swelling and pain are present.  Try to avoid use other than gentle range of motion while the foot is painful. Do not resume use until instructed by your caregiver. Then begin use gradually, not increasing use to the point of pain. If pain does develop, decrease use and continue the above measures, gradually increasing activities that do not cause discomfort, until you gradually achieve normal use.  Use crutches if and as instructed, and for the length of time instructed.  Keep injured foot and ankle wrapped between treatments.  Massage foot and ankle for comfort and to keep swelling down. Massage from the toes up towards the knee.  Only take over-the-counter or prescription medicines for pain, discomfort, or fever as directed by your caregiver. SEEK IMMEDIATE MEDICAL CARE IF:   Your pain and swelling increase, or pain is not controlled with medications.  You have loss of feeling in your foot or your foot turns cold or blue.  You develop new, unexplained symptoms, or an increase of the symptoms that brought you to your caregiver. MAKE SURE YOU:   Understand  these instructions.  Will watch your condition.  Will get help right away if you are not doing well or get worse. Document Released: 02/07/2002 Document Revised: 11/10/2011 Document Reviewed: 04/06/2008 [facility] Patient Information 2015 [place], [place]. This information is not intended to replace advice given to you by your health care provider. Make sure you discuss any questions  you have with your health care provider.

## 2014-08-08 NOTE — ED Notes (Signed)
Pt reports she turned left foot on Friday 08-04-14 . Pt has swelling to foot and pain 8/10. Pt is able to move all toes ,pulses strong.

## 2014-08-08 NOTE — ED Provider Notes (Signed)
CSN: [id number]     Arrival date & time 08/08/14  0801 History   First MD Initiated Contact with Patient 08/08/14 [phone]     Chief Complaint  Patient presents with  . Foot Injury     (Consider location/radiation/quality/duration/timing/severity/associated sxs/prior Treatment) HPI Jennifer Foster is a 68 y.o. female who presents to ED with complaint of left foot injury. Pt states she tripped and twisted her left foot 4 days ago. Since then pain and swelling. States able to put weight on the heel but not on the toes. Able to move all toes. Denies numbness or weakness. No other injuries. No treatment tried. States "i have been hopping around on 1 foot for the last 4 days." no prior foot injury.   Past Medical History  Diagnosis Date  . Hypertension   . Joint pain   . GERD (gastroesophageal reflux disease)   . Hyperlipemia   . History of asthma   . History of anemia   . Lipoma   . Asthma    Past Surgical History  Procedure Laterality Date  . Abdominal hysterectomy  1981    partial, per pt history  . Lipoma excision  01/28/11    neck  . Colonoscopy N/A 12/08/2013    Procedure: COLONOSCOPY;  Surgeon: [name], MD;  Location: [facility];  Service: Endoscopy;  Laterality: N/A;  . Esophagogastroduodenoscopy N/A 12/08/2013    Procedure: ESOPHAGOGASTRODUODENOSCOPY (EGD);  Surgeon: [name], MD;  Location: [facility] ENDOSCOPY;  Service: Endoscopy;  Laterality: N/A;  . Givens capsule study N/A 12/08/2013    Procedure: GIVENS CAPSULE STUDY;  Surgeon: [name], MD;  Location: [facility];  Service: Endoscopy;  Laterality: N/A;   Family History  Problem Relation Age of Onset  . Diabetes type II    . Kidney failure    . Kidney failure Son    History  Substance Use Topics  . Smoking status: Current Every Day Smoker -- 0.25 packs/day    Types: Cigarettes  . Smokeless tobacco: Never Used  . Alcohol Use: 0.0 oz/week     Comment: occasionally   OB History    No data available      Review of Systems  Constitutional: Negative for fever and chills.  Musculoskeletal: Positive for joint swelling and arthralgias. Negative for myalgias, neck pain and neck stiffness.  Skin: Negative for rash and wound.  Neurological: Negative for weakness and numbness.  All other systems reviewed and are negative.     Allergies  Ace inhibitors; Lisinopril; and Tramadol  Home Medications   Prior to Admission medications   Medication Sig Start Date End Date Taking? Authorizing Provider  albuterol (PROVENTIL HFA;VENTOLIN HFA) 108 (90 BASE) MCG/ACT inhaler Inhale 2 puffs into the lungs every 6 (six) hours as needed for wheezing or shortness of breath.    Historical Provider, MD  amLODipine (NORVASC) 10 MG tablet Take 1 tablet (10 mg total) by mouth daily. 07/13/14   Andrena Mews, MD  carvedilol (COREG) 12.5 MG tablet Take 1 tablet (12.5 mg total) by mouth 2 (two) times daily with a meal. 05/02/14   Andrena Mews, MD  nicotine polacrilex (NICORETTE) 2 MG gum Take 2 mg by mouth as needed for smoking cessation.    Historical Provider, MD  Omega-3 Fatty Acids (FISH OIL) 1000 MG CAPS Take 1,000 mg by mouth daily.     Historical Provider, MD  polyethylene glycol (MIRALAX / GLYCOLAX) packet Take 17 g by mouth daily as needed. 12/27/13  Andrena Mews, MD  rosuvastatin (CRESTOR) 20 MG tablet Take 1 tablet (20 mg total) by mouth at bedtime. 05/02/14   Andrena Mews, MD   BP 163/63 mmHg  Pulse 68  Temp(Src) 97.9 F (36.6 C) (Oral)  Resp 18  Ht 5\' 4"  (1.626 m)  Wt 180 lb (81.647 kg)  BMI 30.88 kg/m2  SpO2 100% Physical Exam  Constitutional: She appears well-developed and well-nourished. No distress.  Eyes: Conjunctivae are normal.  Neck: Neck supple.  Musculoskeletal:  Swelling noted to the left dorsal foot. Tender to palpation over the third, fourth, fifth metatarsals. No tenderness over medial lateral malleoli. Achilles tendon is nontender and intact. Full range of motion at the ankle  joint, pain with eversion and inversion. DP pulses intact and equal bilaterally. Patient is able to move all toes without difficulty.   Neurological: She is alert.  Skin: Skin is warm and dry.  Nursing note and vitals reviewed.   ED Course  Procedures (including critical care time) Labs Review Labs Reviewed - No data to display  Imaging Review Dg Foot Complete Left  08/08/2014   CLINICAL DATA:  Foot pain and swelling secondary to a fall.  EXAM: LEFT FOOT - COMPLETE 3+ VIEW  COMPARISON:  None.  FINDINGS: There is no fracture or dislocation. Minimal degenerative changes of the head of the first metatarsal. Small calcaneal osteophytes. Soft tissue swelling over the forefoot.  IMPRESSION: No acute osseous abnormalities.   Electronically Signed   By: Rozetta Nunnery M.D.   On: 08/08/2014 08:36     EKG Interpretation None      MDM   Final diagnoses:  Foot sprain, left, initial encounter   patient is here with left foot injury. Injury 4 days ago. We will get an x-ray.   X-rays negative. Suspect a sprain. Ace wrap, crutches, patient states she has a set of crutches and follow-up with ibuprofen, Norco, and follow-up with her primary care doctor.  Filed Vitals:   08/08/14 0810  BP: 163/63  Pulse: 68  Temp: 97.9 F (36.6 C)  TempSrc: Oral  Resp: 18  Height: 5\' 4"  (1.626 m)  Weight: 180 lb (81.647 kg)  SpO2: 100%     Renold Genta, PA-C 08/08/14 1153  Charlesetta Shanks, MD 08/08/14 1629

## 2014-08-09 ENCOUNTER — Other Ambulatory Visit: Payer: Self-pay

## 2014-08-09 DIAGNOSIS — Z1231 Encounter for screening mammogram for malignant neoplasm of breast: Secondary | ICD-10-CM

## 2014-08-11 ENCOUNTER — Ambulatory Visit (INDEPENDENT_AMBULATORY_CARE_PROVIDER_SITE_OTHER): Payer: Commercial Managed Care - HMO | Admitting: Family Medicine

## 2014-08-11 ENCOUNTER — Encounter: Payer: Self-pay | Admitting: Family Medicine

## 2014-08-11 VITALS — BP 126/64 | HR 72 | Temp 98.0°F | Ht 64.0 in | Wt 190.0 lb

## 2014-08-11 DIAGNOSIS — I10 Essential (primary) hypertension: Secondary | ICD-10-CM

## 2014-08-11 DIAGNOSIS — S93402D Sprain of unspecified ligament of left ankle, subsequent encounter: Secondary | ICD-10-CM

## 2014-08-11 DIAGNOSIS — S93402A Sprain of unspecified ligament of left ankle, initial encounter: Secondary | ICD-10-CM | POA: Insufficient documentation

## 2014-08-11 NOTE — Patient Instructions (Signed)
Exercise to Lose Weight Exercise and a healthy diet may help you lose weight. Your doctor may suggest specific exercises. EXERCISE IDEAS AND TIPS  Choose low-cost things you enjoy doing, such as walking, bicycling, or exercising to workout videos.  Take stairs instead of the elevator.  Walk during your lunch break.  Park your car further away from work or school.  Go to a gym or an exercise class.  Start with 5 to 10 minutes of exercise each day. Build up to 30 minutes of exercise 4 to 6 days a week.  Wear shoes with good support and comfortable clothes.  Stretch before and after working out.  Work out until you breathe harder and your heart beats faster.  Drink extra water when you exercise.  Do not do so much that you hurt yourself, feel dizzy, or get very short of breath. Exercises that burn about 150 calories:  Running 1  miles in 15 minutes.  Playing volleyball for 45 to 60 minutes.  Washing and waxing a car for 45 to 60 minutes.  Playing touch football for 45 minutes.  Walking 1  miles in 35 minutes.  Pushing a stroller 1  miles in 30 minutes.  Playing basketball for 30 minutes.  Raking leaves for 30 minutes.  Bicycling 5 miles in 30 minutes.  Walking 2 miles in 30 minutes.  Dancing for 30 minutes.  Shoveling snow for 15 minutes.  Swimming laps for 20 minutes.  Walking up stairs for 15 minutes.  Bicycling 4 miles in 15 minutes.  Gardening for 30 to 45 minutes.  Jumping rope for 15 minutes.  Washing windows or floors for 45 to 60 minutes. Document Released: 09/20/2010 Document Revised: 11/10/2011 Document Reviewed: 09/20/2010 [employer] Patient Information 2015 [employer]. This information is not intended to replace advice given to you by your health care provider. Make sure you discuss any questions you have with your health care provider.  

## 2014-08-11 NOTE — Assessment & Plan Note (Signed)
Xray done at the ED reviewed with no evidence of fracture. Pain and swelling are improving. Monitor for improvement. Continue pain medicine as needed.

## 2014-08-11 NOTE — Progress Notes (Signed)
Subjective:     Patient ID: Jennifer Foster, female   DOB: [birth year], 68 y.o.   MRN: [id number]  HPI  HTN: Here for follow up, denies any concern, she is compliant with all her medications. Foot pain:Patient was recently seen in the ED for left foot sprain after she tripped at home, she had a normal xray at the ED,her pain has improved from 10/10 to 5/10 in severity, she does not take her Vicodin so often. Her left foot swelling is improving as well.  Current Outpatient Prescriptions on File Prior to Visit  Medication Sig Dispense Refill  . albuterol (PROVENTIL HFA;VENTOLIN HFA) 108 (90 BASE) MCG/ACT inhaler Inhale 2 puffs into the lungs every 6 (six) hours as needed for wheezing or shortness of breath.    [name] amLODipine (NORVASC) 10 MG tablet Take 1 tablet (10 mg total) by mouth daily. 30 tablet 3  . carvedilol (COREG) 12.5 MG tablet Take 1 tablet (12.5 mg total) by mouth 2 (two) times daily with a meal. 180 tablet 1  . HYDROcodone-acetaminophen (NORCO) 5-325 MG per tablet Take 1 tablet by mouth every 6 (six) hours as needed for moderate pain. 10 tablet 0  . naproxen (NAPROSYN) 500 MG tablet Take 1 tablet (500 mg total) by mouth 2 (two) times daily. 30 tablet 0  . rosuvastatin (CRESTOR) 20 MG tablet Take 1 tablet (20 mg total) by mouth at bedtime. 90 tablet 1  . nicotine polacrilex (NICORETTE) 2 MG gum Take 2 mg by mouth as needed for smoking cessation.    . Omega-3 Fatty Acids (FISH OIL) 1000 MG CAPS Take 1,000 mg by mouth daily.     . polyethylene glycol (MIRALAX / GLYCOLAX) packet Take 17 g by mouth daily as needed. (Patient not taking: Reported on 08/11/2014) 14 each 3   No current facility-administered medications on file prior to visit.   Past Medical History  Diagnosis Date  . Hypertension   . Joint pain   . GERD (gastroesophageal reflux disease)   . Hyperlipemia   . History of asthma   . History of anemia   . Lipoma   . Asthma       Review of Systems  Respiratory: Negative.    Cardiovascular: Negative.   Gastrointestinal: Negative.   Musculoskeletal: Positive for joint swelling and arthralgias.       Ankle pain and swelling.  All other systems reviewed and are negative.      Filed Vitals:   08/11/14 0933  BP: 126/64  Pulse: 72  Temp: 98 F (36.7 C)  Height: 5\' 4"  (1.626 m)  Weight: 190 lb (86.183 kg)    Objective:   Physical Exam  Constitutional: She appears well-developed. No distress.  Cardiovascular: Normal rate, regular rhythm, normal heart sounds and intact distal pulses.   No murmur heard. Pulmonary/Chest: Effort normal and breath sounds normal. No respiratory distress. She has no wheezes.  Abdominal: Soft. Bowel sounds are normal. She exhibits no distension and no mass. There is no tenderness.  Musculoskeletal:       Right ankle: Normal.       Left ankle: She exhibits decreased range of motion and swelling. Tenderness.       Feet:  Nursing note and vitals reviewed.      Assessment:     HTN Ankle sprain     Plan:     Check problem list.

## 2014-08-11 NOTE — Assessment & Plan Note (Signed)
BP well-controlled -Continue current regimen 

## 2014-08-22 ENCOUNTER — Ambulatory Visit
Admission: RE | Admit: 2014-08-22 | Discharge: 2014-08-22 | Disposition: A | Discharge status: Home / Self Care | Payer: Commercial Managed Care - HMO | Source: Ambulatory Visit

## 2014-08-22 DIAGNOSIS — Z1231 Encounter for screening mammogram for malignant neoplasm of breast: Secondary | ICD-10-CM

## 2014-10-14 ENCOUNTER — Observation Stay (HOSPITAL_COMMUNITY)
Admission: EM | Admit: 2014-10-14 | Discharge: 2014-10-15 | Disposition: A | Discharge status: Home / Self Care | Payer: Commercial Managed Care - HMO | Attending: Family Medicine | Admitting: Family Medicine

## 2014-10-14 ENCOUNTER — Encounter (HOSPITAL_COMMUNITY): Payer: Self-pay | Admitting: *Deleted

## 2014-10-14 ENCOUNTER — Emergency Department (HOSPITAL_COMMUNITY): Payer: Commercial Managed Care - HMO

## 2014-10-14 DIAGNOSIS — F419 Anxiety disorder, unspecified: Secondary | ICD-10-CM | POA: Diagnosis not present

## 2014-10-14 DIAGNOSIS — I517 Cardiomegaly: Secondary | ICD-10-CM | POA: Diagnosis not present

## 2014-10-14 DIAGNOSIS — R0602 Shortness of breath: Secondary | ICD-10-CM | POA: Diagnosis not present

## 2014-10-14 DIAGNOSIS — K922 Gastrointestinal hemorrhage, unspecified: Secondary | ICD-10-CM | POA: Diagnosis present

## 2014-10-14 DIAGNOSIS — Z720 Tobacco use: Secondary | ICD-10-CM | POA: Diagnosis present

## 2014-10-14 DIAGNOSIS — Z79899 Other long term (current) drug therapy: Secondary | ICD-10-CM | POA: Insufficient documentation

## 2014-10-14 DIAGNOSIS — I10 Essential (primary) hypertension: Secondary | ICD-10-CM | POA: Diagnosis present

## 2014-10-14 DIAGNOSIS — J45909 Unspecified asthma, uncomplicated: Secondary | ICD-10-CM | POA: Insufficient documentation

## 2014-10-14 DIAGNOSIS — F1490 Cocaine use, unspecified, uncomplicated: Secondary | ICD-10-CM | POA: Diagnosis present

## 2014-10-14 DIAGNOSIS — R7301 Impaired fasting glucose: Secondary | ICD-10-CM | POA: Diagnosis present

## 2014-10-14 DIAGNOSIS — D696 Thrombocytopenia, unspecified: Secondary | ICD-10-CM | POA: Insufficient documentation

## 2014-10-14 DIAGNOSIS — N183 Chronic kidney disease, stage 3 unspecified: Secondary | ICD-10-CM | POA: Diagnosis present

## 2014-10-14 DIAGNOSIS — F329 Major depressive disorder, single episode, unspecified: Secondary | ICD-10-CM | POA: Diagnosis present

## 2014-10-14 DIAGNOSIS — F411 Generalized anxiety disorder: Secondary | ICD-10-CM | POA: Diagnosis present

## 2014-10-14 DIAGNOSIS — K219 Gastro-esophageal reflux disease without esophagitis: Secondary | ICD-10-CM | POA: Insufficient documentation

## 2014-10-14 DIAGNOSIS — F17210 Nicotine dependence, cigarettes, uncomplicated: Secondary | ICD-10-CM | POA: Insufficient documentation

## 2014-10-14 DIAGNOSIS — E785 Hyperlipidemia, unspecified: Secondary | ICD-10-CM | POA: Insufficient documentation

## 2014-10-14 DIAGNOSIS — Z66 Do not resuscitate: Secondary | ICD-10-CM | POA: Diagnosis not present

## 2014-10-14 DIAGNOSIS — F17200 Nicotine dependence, unspecified, uncomplicated: Secondary | ICD-10-CM

## 2014-10-14 DIAGNOSIS — M25519 Pain in unspecified shoulder: Secondary | ICD-10-CM | POA: Diagnosis present

## 2014-10-14 DIAGNOSIS — R079 Chest pain, unspecified: Principal | ICD-10-CM

## 2014-10-14 DIAGNOSIS — I129 Hypertensive chronic kidney disease with stage 1 through stage 4 chronic kidney disease, or unspecified chronic kidney disease: Secondary | ICD-10-CM | POA: Diagnosis not present

## 2014-10-14 DIAGNOSIS — E876 Hypokalemia: Secondary | ICD-10-CM | POA: Insufficient documentation

## 2014-10-14 DIAGNOSIS — F32A Depression, unspecified: Secondary | ICD-10-CM | POA: Diagnosis present

## 2014-10-14 DIAGNOSIS — K2970 Gastritis, unspecified, without bleeding: Secondary | ICD-10-CM | POA: Diagnosis present

## 2014-10-14 DIAGNOSIS — K2990 Gastroduodenitis, unspecified, without bleeding: Secondary | ICD-10-CM

## 2014-10-14 DIAGNOSIS — R0989 Other specified symptoms and signs involving the circulatory and respiratory systems: Secondary | ICD-10-CM | POA: Diagnosis not present

## 2014-10-14 LAB — CBC
HCT: 33.4 % — ABNORMAL LOW (ref 36.0–46.0)
Hemoglobin: 11.7 g/dL — ABNORMAL LOW (ref 12.0–15.0)
MCH: 27.7 pg (ref 26.0–34.0)
MCHC: 35 g/dL (ref 30.0–36.0)
MCV: 79.1 fL (ref 78.0–100.0)
Platelets: 156 10*3/uL (ref 150–400)
RBC: 4.22 MIL/uL (ref 3.87–5.11)
RDW: 14.6 % (ref 11.5–15.5)
WBC: 4.3 10*3/uL (ref 4.0–10.5)

## 2014-10-14 LAB — LIPID PANEL
CHOL/HDL RATIO: 3.8 ratio
Cholesterol: 139 mg/dL (ref 0–200)
HDL: 37 mg/dL — AB (ref >39)
LDL Cholesterol: 49 mg/dL (ref 0–99)
Triglycerides: 264 mg/dL — ABNORMAL HIGH (ref <150)
VLDL: 53 mg/dL — ABNORMAL HIGH (ref 0–40)

## 2014-10-14 LAB — HEPATIC FUNCTION PANEL
ALBUMIN: 3.2 g/dL — AB (ref 3.5–5.2)
ALK PHOS: 59 U/L (ref 39–117)
ALT: 18 U/L (ref 0–35)
AST: 25 U/L (ref 0–37)
Bilirubin, Direct: 0.1 mg/dL (ref 0.0–0.5)
Indirect Bilirubin: 0.7 mg/dL (ref 0.3–0.9)
Total Bilirubin: 0.8 mg/dL (ref 0.3–1.2)
Total Protein: 6.8 g/dL (ref 6.0–8.3)

## 2014-10-14 LAB — I-STAT TROPONIN, ED: Troponin i, poc: 0.02 ng/mL (ref 0.00–0.08)

## 2014-10-14 LAB — BASIC METABOLIC PANEL
Anion gap: 4 — ABNORMAL LOW (ref 5–15)
BUN: 17 mg/dL (ref 6–23)
CO2: 32 mmol/L (ref 19–32)
Calcium: 8.5 mg/dL (ref 8.4–10.5)
Chloride: 107 mmol/L (ref 96–112)
Creatinine, Ser: 1.31 mg/dL — ABNORMAL HIGH (ref 0.50–1.10)
GFR calc non Af Amer: 41 mL/min — ABNORMAL LOW (ref >90)
GFR, EST AFRICAN AMERICAN: 48 mL/min — AB (ref >90)
GLUCOSE: 107 mg/dL — AB (ref 70–99)
Potassium: 3.6 mmol/L (ref 3.5–5.1)
Sodium: 143 mmol/L (ref 135–145)

## 2014-10-14 LAB — LIPASE, BLOOD: LIPASE: 30 U/L (ref 11–59)

## 2014-10-14 LAB — TSH: TSH: 0.527 u[IU]/mL (ref 0.350–4.500)

## 2014-10-14 LAB — TROPONIN I

## 2014-10-14 MED ORDER — CARVEDILOL 12.5 MG PO TABS
12.5000 mg | ORAL_TABLET | Freq: Two times a day (BID) | ORAL | Status: DC
  Administered 2014-10-14: 12.5 mg via ORAL
  Filled 2014-10-14 (×2): qty 1

## 2014-10-14 MED ORDER — GI COCKTAIL ~~LOC~~
30.0000 mL | Freq: Once | ORAL | Status: AC
  Administered 2014-10-14: 30 mL via ORAL
  Filled 2014-10-14: qty 30

## 2014-10-14 MED ORDER — ASPIRIN 81 MG PO CHEW
324.0000 mg | CHEWABLE_TABLET | Freq: Once | ORAL | Status: AC
  Administered 2014-10-14: 324 mg via ORAL
  Filled 2014-10-14: qty 4

## 2014-10-14 MED ORDER — PANTOPRAZOLE SODIUM 40 MG PO TBEC
40.0000 mg | DELAYED_RELEASE_TABLET | Freq: Every day | ORAL | Status: DC
  Administered 2014-10-15: 40 mg via ORAL
  Filled 2014-10-14: qty 1

## 2014-10-14 MED ORDER — HYDROCODONE-ACETAMINOPHEN 5-325 MG PO TABS: 1.0000 | ORAL_TABLET | Freq: Four times a day (QID) | ORAL | Status: DC | PRN

## 2014-10-14 MED ORDER — ONDANSETRON HCL 4 MG/2ML IJ SOLN: 4.0000 mg | Freq: Four times a day (QID) | INTRAMUSCULAR | Status: DC | PRN

## 2014-10-14 MED ORDER — NICOTINE POLACRILEX 2 MG MT GUM
2.0000 mg | CHEWING_GUM | OROMUCOSAL | Status: DC | PRN
  Filled 2014-10-14: qty 1

## 2014-10-14 MED ORDER — NITROGLYCERIN 2 % TD OINT
0.5000 [in_us] | TOPICAL_OINTMENT | Freq: Once | TRANSDERMAL | Status: AC
  Administered 2014-10-14: 0.5 [in_us] via TOPICAL
  Filled 2014-10-14: qty 1

## 2014-10-14 MED ORDER — GI COCKTAIL ~~LOC~~
30.0000 mL | Freq: Four times a day (QID) | ORAL | Status: DC | PRN
  Filled 2014-10-14: qty 30

## 2014-10-14 MED ORDER — AMLODIPINE BESYLATE 10 MG PO TABS
10.0000 mg | ORAL_TABLET | Freq: Every day | ORAL | Status: DC
  Administered 2014-10-15: 10 mg via ORAL
  Filled 2014-10-14: qty 1

## 2014-10-14 MED ORDER — ACETAMINOPHEN 325 MG PO TABS: 325.0000 mg | ORAL_TABLET | ORAL | Status: DC | PRN

## 2014-10-14 MED ORDER — TROLAMINE SALICYLATE 10 % EX CREA: TOPICAL_CREAM | Freq: Two times a day (BID) | CUTANEOUS | Status: DC | PRN

## 2014-10-14 MED ORDER — ASPIRIN EC 81 MG PO TBEC
81.0000 mg | DELAYED_RELEASE_TABLET | Freq: Every day | ORAL | Status: DC
  Administered 2014-10-15: 81 mg via ORAL
  Filled 2014-10-14: qty 1

## 2014-10-14 MED ORDER — CARVEDILOL 12.5 MG PO TABS
12.5000 mg | ORAL_TABLET | Freq: Two times a day (BID) | ORAL | Status: DC
  Administered 2014-10-14 – 2014-10-15 (×3): 12.5 mg via ORAL
  Filled 2014-10-14 (×5): qty 1

## 2014-10-14 MED ORDER — ROSUVASTATIN CALCIUM 20 MG PO TABS
20.0000 mg | ORAL_TABLET | Freq: Every day | ORAL | Status: DC
  Administered 2014-10-14: 20 mg via ORAL
  Filled 2014-10-14 (×3): qty 1

## 2014-10-14 MED ORDER — AMLODIPINE BESYLATE 5 MG PO TABS
10.0000 mg | ORAL_TABLET | Freq: Once | ORAL | Status: AC
  Administered 2014-10-14: 10 mg via ORAL
  Filled 2014-10-14: qty 2

## 2014-10-14 NOTE — ED Provider Notes (Addendum)
Medical screening examination/treatment/procedure(s) were conducted as a shared visit with non-physician practitioner(s) and myself.  I personally evaluated the patient during the encounter.   EKG Interpretation   Date/Time:  Saturday October 14 2014 08:52:49 EST Ventricular Rate:  67 PR Interval:  164 QRS Duration: 98 QT Interval:  446 QTC Calculation: 471 R Axis:   11 Text Interpretation:  Normal sinus rhythm Left ventricular hypertrophy  with repolarization abnormality Abnormal ECG Confirmed by Madhav Mohon  MD,  Sumner Kirchman ([id number]) on 10/14/2014 9:15:20 AM      Patient with several cardiac risk factors. However patient's had chest pain nonstop for 3 days. Does have a history reflux. First troponin is negative making unstable angina or an acute cardiac event unlikely. Patient's main complaint is been epigastric discomfort. EKG without acute changes. Will complete rest of workup. Lungs clear bilaterally heart regular rate and rhythm.    Fredia Sorrow, MD 10/14/14 [phone]  Results for orders placed or performed during the hospital encounter of 10/14/14  CBC  Result Value Ref Range   WBC 4.3 4.0 - 10.5 K/uL   RBC 4.22 3.87 - 5.11 MIL/uL   Hemoglobin 11.7 (L) 12.0 - 15.0 g/dL   HCT 33.4 (L) 36.0 - 46.0 %   MCV 79.1 78.0 - 100.0 fL   MCH 27.7 26.0 - 34.0 pg   MCHC 35.0 30.0 - 36.0 g/dL   RDW 14.6 11.5 - 15.5 %   Platelets 156 150 - 400 K/uL  Basic metabolic panel  Result Value Ref Range   Sodium 143 135 - 145 mmol/L   Potassium 3.6 3.5 - 5.1 mmol/L   Chloride 107 96 - 112 mmol/L   CO2 32 19 - 32 mmol/L   Glucose, Bld 107 (H) 70 - 99 mg/dL   BUN 17 6 - 23 mg/dL   Creatinine, Ser 1.31 (H) 0.50 - 1.10 mg/dL   Calcium 8.5 8.4 - 10.5 mg/dL   GFR calc non Af Amer 41 (L) >90 mL/min   GFR calc Af Amer 48 (L) >90 mL/min   Anion gap 4 (L) 5 - 15  Hepatic function panel  Result Value Ref Range   Total Protein 6.8 6.0 - 8.3 g/dL   Albumin 3.2 (L) 3.5 - 5.2 g/dL   AST 25 0 - 37 U/L   ALT 18 0 - 35 U/L   Alkaline Phosphatase 59 39 - 117 U/L   Total Bilirubin 0.8 0.3 - 1.2 mg/dL   Bilirubin, Direct 0.1 0.0 - 0.5 mg/dL   Indirect Bilirubin 0.7 0.3 - 0.9 mg/dL  Lipase, blood  Result Value Ref Range   Lipase 30 11 - 59 U/L  I-stat troponin, ED (not at [facility])  Result Value Ref Range   Troponin i, poc 0.02 0.00 - 0.08 ng/mL   Comment 3           Dg Chest 2 View  10/14/2014   CLINICAL DATA:  CP (slightly to the left from mid-sternal) for the past 3 days, ongoing SOB, cough and congestion (for a while). Hx smoker for 40+ years at a rate of 2-3 dailyHx asthma, HTN - meds  EXAM: CHEST - 2 VIEW  COMPARISON:  12/08/2013  FINDINGS: Mild cardiomegaly.  Tortuous thoracic aorta.  Lungs are clear. No effusion. Visualized skeletal structures are unremarkable.  IMPRESSION: 1. Stable mild cardiomegaly. 2. No acute disease.   Electronically Signed   By: Lucrezia Europe M.D.   On: 10/14/2014 10:00    Patient's labs are negative. However I for  careful questioning patient now stating that the chest pain has been intermittent so it does correlate with nurses got in triage. Also the pain is up in the left substernal area. Patient states sometimes it is been gone for most of the day. We started today at 7 in the morning went away shortly after arrival here. Patient did roll over and then it was recurrent. With her risk factors for cardiac disease patient will require rule out. Not able to make the statement about the first troponin being adequate to rule out a cardiac event. Patient's been admitted by family practice in the past.  Fredia Sorrow, MD 10/14/14 1031

## 2014-10-14 NOTE — ED Notes (Signed)
Epigastric pain radiating up mid chestthat is intermittent; some sob. Hx. Of reflux.

## 2014-10-14 NOTE — ED Notes (Signed)
Patient transported to x-ray. ?

## 2014-10-14 NOTE — Progress Notes (Signed)
[facility] Admission History and Physical Service Pager: [phone]  Patient name: Jennifer Foster Medical record number: [id number] Date of birth: [birth year] Age: 69 y.o. Gender: female  Primary Care Provider: Andrena Mews, MD Consultants: None Code Status: DNR per discussion on admission  Chief Complaint: chest pain  Assessment and Plan: Jennifer Foster is a 69 y.o. female presenting with three day history of epigastric/chest pain. PMH is significant for HTN, HLD, tobacco abuse, reflux, depression, anxiety, h/o cocaine abuse, and CKD III.  # Epigastric/Chest Pain-  Most likely musculoskeletal (pain with movement and palpation, heavy pulling during dogwalking) and GI (history of GERD, similar pain, improvement with GI cocktail), with cardiac etiology (istat troponin negative, EKG unchanged) in differential due to Heart score 4 (risk factors, age). CXR with stable cardiomegaly and no evidence of acute cardiopulmonary disease. No LE asymmetry and no hypoxemia/tachycardia making PE very unlikely. No overlying rash makes shingles unlikely though only 3 d in. - Place in observation, Hensel attending, admitted with chest pain order set - EKG in AM, trend troponins - Risk stratification labs (TSH, A1C, Lipid Panel) - UDS - Aspirin 81mg  and continue crestor 20mg  - Continue Coreg 12.5mg  BID - Tylenol PRN mild pain, Norco PRN moderate pain, Aspercreme PRN and heating pad; NTG patch in place on chest - GI cocktail PRN - Discussed weight and tobacco cessation (see below). - Monitor for skin changes that would bump shingles higher in ddx.  #HTN: Home medications include Amlodipine 10mg  and Coreg 12.5mg  BID - If hypotension develops, consider removal of Nitro patch  - Continue home medications. - TED hose  # Hyperlipidemia- Home medication Crestor 20mg  - Follow up lipid panel - Continue home medication  # CKD Stage III- Creatinine 1.31 at admission. Baseline  1.3-1.5. GFR 48. Strong family history of kidney disease requiring dialysis. - Follow up am BMP - Continue to monitor  # Tobacco Cessation- Smokes three cigarettes/day. Interested in quitting. - Nicotine gum PRN - Continue to counsel on cessation - Will list QUIT line in discharge instructions  FEN/GI: [place]ine [place], Heart Healthy Diet Prophylaxis: No heparin given ?GI component and report of h/o GI bleed (chart shows small nonbleeding AVNs); SCDs.   Disposition: Placed in Observation, Hensel attending. Discharge home pending cardiac etiology rule out  History of Present Illness: Jennifer Foster is a 69 y.o. female presenting with substernal and infrasternal pain that is intermittent with certain movement starting 3 days ago. Describes pain as sore and thought it was related to gas and her h/o reflux. Has tried tums, gas-x, and sodas with no improvement. Reports some shortness of breath at baseline but none over baseline, and no orthopnea or PND. Denies fever, chills, rash over area, association with food or exertion, swelling, redness, decrease in activity (able to walk through [place]), nausea, vomiting, or radiation. Has some ?epigastric abdominal pain vs related to infrasternal pain. Has a pitbull that pulls her when they walk. No trauma or heavy lifting. No recent drug use. Drinks 1-2 40's of beer per week. Reports trying to quit smoking and is currently smoking about three cigarettes a day. No history of echocardiograms, stress tests, or cardiac catheterizations. Has not taken aspirin, BC powder, or NSAIDs at home (was told to not take NSAIDs due to h/o bleeding). Reports compliance with meds. Has noted increased stress, stating her grandson who she has raised is in prison and is always asking for money.  Review Of Systems: Per HPI Otherwise 12 point  review of systems was performed and was unremarkable.  Patient Active Problem List   Diagnosis Date Noted  . Chest pain 10/14/2014  .  Left ankle sprain 08/11/2014  . Gout 02/28/2014  . Post-menopausal 02/28/2014  . Hospital discharge follow-up 12/16/2013  . Lower GI bleed 12/06/2013  . Bilateral knee pain 10/12/2013  . Left ankle pain 10/12/2013  . Wrist pain, right 10/12/2013  . Bilateral leg edema 03/15/2013  . Depression 02/15/2013  . Low back pain 07/20/2012  . Poor dentition 04/05/2012  . Bony callus 09/08/2011  . Impaired fasting glucose 07/17/2011  . Lipoma 03/19/2011  . Cocaine use 01/22/2011  . Hyperlipidemia with target LDL less than 100 01/01/2011  . SHOULDER PAIN 05/15/2010  . ANEMIA 11/23/2009  . GASTRITIS, CHRONIC 11/23/2009  . BLOOD IN STOOL, OCCULT 10/06/2009  . TOBACCO USER 09/03/2009  . Generalized anxiety disorder 08/13/2009  . CHRONIC KIDNEY DISEASE STAGE III (MODERATE) 05/21/2009  . HYPERTENSION, BENIGN ESSENTIAL 10/14/2007  . DIVERTICULAR BLEEDING, HX OF 10/14/2007   Past Medical History: Past Medical History  Diagnosis Date  . Hypertension   . Joint pain   . GERD (gastroesophageal reflux disease)   . Hyperlipemia   . History of asthma   . History of anemia   . Lipoma   . Asthma    Past Surgical History: Past Surgical History  Procedure Laterality Date  . Abdominal hysterectomy  1981    partial, per pt history  . Lipoma excision  01/28/11    neck  . Colonoscopy N/A 12/08/2013    Procedure: COLONOSCOPY;  Surgeon: [name], MD;  Location: [place];  Service: Endoscopy;  Laterality: N/A;  . Esophagogastroduodenoscopy N/A 12/08/2013    Procedure: ESOPHAGOGASTRODUODENOSCOPY (EGD);  Surgeon: [name], MD;  Location: [facility] ENDOSCOPY;  Service: Endoscopy;  Laterality: N/A;  . Givens capsule study N/A 12/08/2013    Procedure: GIVENS CAPSULE STUDY;  Surgeon: [name], MD;  Location: [place];  Service: Endoscopy;  Laterality: N/A;   Social History: History  Substance Use Topics  . Smoking status: Current Every Day Smoker -- 0.25 packs/day    Types: Cigarettes  .  Smokeless tobacco: Never Used  . Alcohol Use: 0.0 oz/week     Comment: occasionally   Please also refer to relevant sections of EMR.  Family History: Family History  Problem Relation Age of Onset  . Diabetes type II    . Kidney failure    . Kidney failure Son    Allergies and Medications: Allergies  Allergen Reactions  . Ace Inhibitors Swelling  . Lisinopril Swelling    Swollen tongue   . Tramadol Itching   No current facility-administered medications on file prior to encounter.   Current Outpatient Prescriptions on File Prior to Encounter  Medication Sig Dispense Refill  . albuterol (PROVENTIL HFA;VENTOLIN HFA) 108 (90 BASE) MCG/ACT inhaler Inhale 2 puffs into the lungs every 6 (six) hours as needed for wheezing or shortness of breath.    [name] amLODipine (NORVASC) 10 MG tablet Take 1 tablet (10 mg total) by mouth daily. 30 tablet 3  . carvedilol (COREG) 12.5 MG tablet Take 1 tablet (12.5 mg total) by mouth 2 (two) times daily with a meal. 180 tablet 1  . HYDROcodone-acetaminophen (NORCO) 5-325 MG per tablet Take 1 tablet by mouth every 6 (six) hours as needed for moderate pain. 10 tablet 0  . naproxen (NAPROSYN) 500 MG tablet Take 1 tablet (500 mg total) by mouth 2 (two) times daily. [place]  tablet 0  . polyethylene glycol (MIRALAX / GLYCOLAX) packet Take 17 g by mouth daily as needed. (Patient not taking: Reported on 08/11/2014) 14 each 3  . rosuvastatin (CRESTOR) 20 MG tablet Take 1 tablet (20 mg total) by mouth at bedtime. 90 tablet 1    Objective: BP 146/61 mmHg  Pulse 60  Temp(Src) 98.1 F (36.7 C) (Oral)  Resp 19  SpO2 98% Exam: General: 69yo female resting comfortably in no apparent distress, tearful when discussing grandson HEENT: Moist mucous membranes, sclera white Cardiovascular: S1 and S2 noted. No murmurs/rubs/gallops. Regular rate and rhythm. Respiratory: CTAB, normal effort with occasional dry sounding cough, No wheezing noted. No increased work of  breathing. Chest: Tender at left sternochondral joint. Elevation somatic dysfunction noted over left ribs at site of pain. Soft tissue mass just lateral to right half of sternum. Nitro patch on chest. Abdomen: S/NT/mildly distended and obese, NABS Extremities: 1+ LE edema to mid-shin, DP pulses palpable, no calf tenderness Skin: No rash overlying tender chest, Warm Neuro: AAO, no focal deficits  Labs and Imaging: CBC BMET   Recent Labs Lab 10/14/14 0858  WBC 4.3  HGB 11.7*  HCT 33.4*  PLT 156    Recent Labs Lab 10/14/14 0858  NA 143  K 3.6  CL 107  CO2 32  BUN 17  CREATININE 1.31*  GLUCOSE 107*  CALCIUM 8.5    - POC troponin negative - EKG unremarkable - Creatinine baseline ~1.3-1.5  Dg Chest 2 View  10/14/2014   CLINICAL DATA:  CP (slightly to the left from mid-sternal) for the past 3 days, ongoing SOB, cough and congestion (for a while). Hx smoker for 40+ years at a rate of 2-3 dailyHx asthma, HTN - meds  EXAM: CHEST - 2 VIEW  COMPARISON:  12/08/2013  FINDINGS: Mild cardiomegaly.  Tortuous thoracic aorta.  Lungs are clear. No effusion. Visualized skeletal structures are unremarkable.  IMPRESSION: 1. Stable mild cardiomegaly. 2. No acute disease.   Electronically Signed   By: Lucrezia Europe M.D.   On: 10/14/2014 10:00   Lorna Few, DO 10/14/2014, 2:15 PM PGY-1, [place] Intern pager: [phone], text pages welcome   I have seen and examined the patient with Dr Gerlean Ren and agree with her assessment and plan with my changes in blue. Hilton Sinclair, MD PGY-3, [place]

## 2014-10-14 NOTE — Progress Notes (Signed)
Patient admitted to room 2w20. Placed on tele and vitals obtained. Patient denies any needs at this time.

## 2014-10-14 NOTE — ED Provider Notes (Signed)
CSN: [id number]     Arrival date & time 10/14/14  [id number] History   First MD Initiated Contact with Patient 10/14/14 0920     Chief Complaint  Patient presents with  . Chest Pain     (Consider location/radiation/quality/duration/timing/severity/associated sxs/prior Treatment) HPI Comments: Patient is a 69 yo female past medical history significant for hypertension, hyperlipidemia, tobacco abuse presenting to the emergency department for 3 days of gradually worsening nonradiating epigastric "sore" pain. Patient's describes it as gas pain, attempts to take over-the-counter anti-gas medicines and sodas with no improvement of her symptoms. No modifying factors identified. Denies any associated fevers, shortness of breath, cough, abdominal pain, nausea, vomiting, diaphoresis. No history of echocardiograms, stress test, cardiac catheterizations.   Patient is a 69 y.o. female presenting with chest pain.  Chest Pain   Past Medical History  Diagnosis Date  . Hypertension   . Joint pain   . GERD (gastroesophageal reflux disease)   . Hyperlipemia   . History of asthma   . History of anemia   . Lipoma   . Asthma    Past Surgical History  Procedure Laterality Date  . Abdominal hysterectomy  1981    partial, per pt history  . Lipoma excision  01/28/11    neck  . Colonoscopy N/A 12/08/2013    Procedure: COLONOSCOPY;  Surgeon: [name], MD;  Location: [place];  Service: Endoscopy;  Laterality: N/A;  . Esophagogastroduodenoscopy N/A 12/08/2013    Procedure: ESOPHAGOGASTRODUODENOSCOPY (EGD);  Surgeon: [name], MD;  [facility] ENDOSCOPY;  Service: Endoscopy;  Laterality: N/A;  . Givens capsule study N/A 12/08/2013    Procedure: GIVENS CAPSULE STUDY;  Surgeon: [name], MD;  Location: [place];  Service: Endoscopy;  Laterality: N/A;   Family History  Problem Relation Age of Onset  . Diabetes type II    . Kidney failure    . Kidney failure Son    History  Substance Use  Topics  . Smoking status: Current Every Day Smoker -- 0.25 packs/day    Types: Cigarettes  . Smokeless tobacco: Never Used  . Alcohol Use: 0.0 oz/week     Comment: occasionally   OB History    No data available     Review of Systems  Cardiovascular: Positive for chest pain.  All other systems reviewed and are negative.     Allergies  Ace inhibitors; Lisinopril; and Tramadol  Home Medications   Prior to Admission medications   Medication Sig Start Date End Date Taking? Authorizing Provider  albuterol (PROVENTIL HFA;VENTOLIN HFA) 108 (90 BASE) MCG/ACT inhaler Inhale 2 puffs into the lungs every 6 (six) hours as needed for wheezing or shortness of breath.   Yes Historical Provider, MD  amLODipine (NORVASC) 10 MG tablet Take 1 tablet (10 mg total) by mouth daily. 07/13/14  Yes Andrena Mews, MD  carvedilol (COREG) 12.5 MG tablet Take 1 tablet (12.5 mg total) by mouth 2 (two) times daily with a meal. 05/02/14  Yes Andrena Mews, MD  HYDROcodone-acetaminophen (NORCO) 5-325 MG per tablet Take 1 tablet by mouth every 6 (six) hours as needed for moderate pain. 08/08/14  Yes Tatyana A Kirichenko, PA-C  naproxen (NAPROSYN) 500 MG tablet Take 1 tablet (500 mg total) by mouth 2 (two) times daily. 08/08/14  Yes Tatyana A Kirichenko, PA-C  polyethylene glycol (MIRALAX / GLYCOLAX) packet Take 17 g by mouth daily as needed. Patient not taking: Reported on 08/11/2014 12/27/13   Andrena Mews, MD  rosuvastatin (CRESTOR) 20  MG tablet Take 1 tablet (20 mg total) by mouth at bedtime. 05/02/14  Yes Andrena Mews, MD   BP 169/63 mmHg  Pulse 55  Temp(Src) 98.2 F (36.8 C) (Oral)  Resp 22  SpO2 97% Physical Exam  Constitutional: She is oriented to person, place, and time. She appears well-developed and well-nourished. No distress.  HENT:  Head: Normocephalic and atraumatic.  Right Ear: External ear normal.  Left Ear: External ear normal.  Nose: Nose normal.  Mouth/Throat: Oropharynx is clear and  moist. No oropharyngeal exudate.  Eyes: Conjunctivae are normal.  Neck: Normal range of motion. Neck supple.  Cardiovascular: Normal rate, regular rhythm and normal heart sounds.   Pulmonary/Chest: Effort normal and breath sounds normal. No respiratory distress.  Abdominal: Soft. There is no tenderness.  Musculoskeletal: Normal range of motion. She exhibits no edema.  Neurological: She is alert and oriented to person, place, and time.  Skin: Skin is warm and dry. She is not diaphoretic.  Psychiatric: She has a normal mood and affect.  Nursing note and vitals reviewed.   ED Course  Procedures (including critical care time) Medications  carvedilol (COREG) tablet 12.5 mg (12.5 mg Oral Given 10/14/14 1039)  gi cocktail (Maalox,Lidocaine,Donnatal) (30 mLs Oral Given 10/14/14 0933)  amLODipine (NORVASC) tablet 10 mg (10 mg Oral Given 10/14/14 1038)  aspirin chewable tablet 324 mg (324 mg Oral Given 10/14/14 1037)  nitroGLYCERIN (NITROGLYN) 2 % ointment 0.5 inch (0.5 inches Topical Given 10/14/14 1040)    Labs Review Labs Reviewed  CBC - Abnormal; Notable for the following:    Hemoglobin 11.7 (*)    HCT 33.4 (*)    All other components within normal limits  BASIC METABOLIC PANEL - Abnormal; Notable for the following:    Glucose, Bld 107 (*)    Creatinine, Ser 1.31 (*)    GFR calc non Af Amer 41 (*)    GFR calc Af Amer 48 (*)    Anion gap 4 (*)    All other components within normal limits  HEPATIC FUNCTION PANEL - Abnormal; Notable for the following:    Albumin 3.2 (*)    All other components within normal limits  LIPASE, BLOOD  I-STAT TROPOININ, ED    Imaging Review Dg Chest 2 View  10/14/2014   CLINICAL DATA:  CP (slightly to the left from mid-sternal) for the past 3 days, ongoing SOB, cough and congestion (for a while). Hx smoker for 40+ years at a rate of 2-3 dailyHx asthma, HTN - meds  EXAM: CHEST - 2 VIEW  COMPARISON:  12/08/2013  FINDINGS: Mild cardiomegaly.  Tortuous thoracic  aorta.  Lungs are clear. No effusion. Visualized skeletal structures are unremarkable.  IMPRESSION: 1. Stable mild cardiomegaly. 2. No acute disease.   Electronically Signed   By: Lucrezia Europe M.D.   On: 10/14/2014 10:00     EKG Interpretation   Date/Time:  Saturday October 14 2014 08:52:49 EST Ventricular Rate:  67 PR Interval:  164 QRS Duration: 98 QT Interval:  446 QTC Calculation: 471 R Axis:   11 Text Interpretation:  Normal sinus rhythm Left ventricular hypertrophy  with repolarization abnormality Abnormal ECG Confirmed by ZACKOWSKI  MD,  SCOTT ([id number]) on 10/14/2014 9:15:20 AM      On re-discussion with patient, she recants that the pain has been constant for 3 days, but has been intermittent, last episode of pain began at 7AM this morning, resolved around 10 AM and just reoccurred while in the ED.   MDM  Final diagnoses:  Chest pain, unspecified chest pain type    Filed Vitals:   10/14/14 1034  BP: 169/63  Pulse: 55  Temp: 98.2 F (36.8 C)  Resp: 22    Afebrile, NAD, non-toxic appearing, AAOx4.  I have reviewed nursing notes, vital signs, and all appropriate lab and imaging results for this patient.  Concern for cardiac etiology of Chest Pain. Family Practice has been consulted and will see patient in the ED for likely admit. Pt does not meet criteria for CP protocol and a further evaluation is recommended. Pt has been re-evaluated prior to consult and VSS, NAD, heart RRR, pain 0/10, lungs CTAB. No acute abnormalities found on EKG and first round of cardiac enzymes negative. This case was discussed with Dr. Rogene Houston who has seen the patient and agrees with plan to admit.       Harlow Mares, PA-C 10/14/14 1214  Fredia Sorrow, MD 10/15/14 [phone]

## 2014-10-15 DIAGNOSIS — R079 Chest pain, unspecified: Secondary | ICD-10-CM | POA: Diagnosis not present

## 2014-10-15 DIAGNOSIS — E876 Hypokalemia: Secondary | ICD-10-CM | POA: Diagnosis not present

## 2014-10-15 DIAGNOSIS — Z66 Do not resuscitate: Secondary | ICD-10-CM | POA: Diagnosis not present

## 2014-10-15 DIAGNOSIS — K219 Gastro-esophageal reflux disease without esophagitis: Secondary | ICD-10-CM | POA: Diagnosis not present

## 2014-10-15 DIAGNOSIS — F419 Anxiety disorder, unspecified: Secondary | ICD-10-CM | POA: Diagnosis not present

## 2014-10-15 DIAGNOSIS — I129 Hypertensive chronic kidney disease with stage 1 through stage 4 chronic kidney disease, or unspecified chronic kidney disease: Secondary | ICD-10-CM | POA: Diagnosis not present

## 2014-10-15 DIAGNOSIS — R072 Precordial pain: Secondary | ICD-10-CM | POA: Diagnosis not present

## 2014-10-15 DIAGNOSIS — E785 Hyperlipidemia, unspecified: Secondary | ICD-10-CM

## 2014-10-15 DIAGNOSIS — N183 Chronic kidney disease, stage 3 (moderate): Secondary | ICD-10-CM | POA: Diagnosis not present

## 2014-10-15 DIAGNOSIS — I10 Essential (primary) hypertension: Secondary | ICD-10-CM

## 2014-10-15 DIAGNOSIS — F17210 Nicotine dependence, cigarettes, uncomplicated: Secondary | ICD-10-CM | POA: Diagnosis not present

## 2014-10-15 LAB — BASIC METABOLIC PANEL
ANION GAP: 4 — AB (ref 5–15)
Anion gap: 10 (ref 5–15)
BUN: 16 mg/dL (ref 6–23)
BUN: 16 mg/dL (ref 6–23)
CALCIUM: 8.6 mg/dL (ref 8.4–10.5)
CALCIUM: 9.3 mg/dL (ref 8.4–10.5)
CHLORIDE: 107 mmol/L (ref 96–112)
CO2: 31 mmol/L (ref 19–32)
CO2: 25 mmol/L (ref 19–32)
CREATININE: 1.42 mg/dL — AB (ref 0.50–1.10)
CREATININE: 1.6 mg/dL — AB (ref 0.50–1.10)
Chloride: 105 mmol/L (ref 96–112)
GFR calc Af Amer: 44 mL/min — ABNORMAL LOW (ref >90)
GFR calc Af Amer: 38 mL/min — ABNORMAL LOW (ref >90)
GFR calc non Af Amer: 38 mL/min — ABNORMAL LOW (ref >90)
GFR, EST NON AFRICAN AMERICAN: 33 mL/min — AB (ref >90)
GLUCOSE: 97 mg/dL (ref 70–99)
Glucose, Bld: 106 mg/dL — ABNORMAL HIGH (ref 70–99)
POTASSIUM: 2.8 mmol/L — AB (ref 3.5–5.1)
POTASSIUM: 3.2 mmol/L — AB (ref 3.5–5.1)
SODIUM: 140 mmol/L (ref 135–145)
Sodium: 142 mmol/L (ref 135–145)

## 2014-10-15 LAB — RAPID URINE DRUG SCREEN, HOSP PERFORMED
Amphetamines: NOT DETECTED
BARBITURATES: NOT DETECTED
Benzodiazepines: NOT DETECTED
Cocaine: NOT DETECTED
Opiates: NOT DETECTED
Tetrahydrocannabinol: NOT DETECTED

## 2014-10-15 LAB — CBC
HCT: 31.4 % — ABNORMAL LOW (ref 36.0–46.0)
HEMOGLOBIN: 10.7 g/dL — AB (ref 12.0–15.0)
MCH: 27.7 pg (ref 26.0–34.0)
MCHC: 34.1 g/dL (ref 30.0–36.0)
MCV: 81.3 fL (ref 78.0–100.0)
Platelets: 134 10*3/uL — ABNORMAL LOW (ref 150–400)
RBC: 3.86 MIL/uL — ABNORMAL LOW (ref 3.87–5.11)
RDW: 14.7 % (ref 11.5–15.5)
WBC: 5.3 10*3/uL (ref 4.0–10.5)

## 2014-10-15 LAB — TROPONIN I: TROPONIN I: 0.03 ng/mL (ref <0.031)

## 2014-10-15 MED ORDER — ASPIRIN 81 MG PO TBEC: 81.0000 mg | DELAYED_RELEASE_TABLET | Freq: Every day | ORAL | Status: DC

## 2014-10-15 MED ORDER — POTASSIUM CHLORIDE CRYS ER 20 MEQ PO TBCR
40.0000 meq | EXTENDED_RELEASE_TABLET | Freq: Once | ORAL | Status: AC
  Administered 2014-10-15: 40 meq via ORAL
  Filled 2014-10-15: qty 2

## 2014-10-15 MED ORDER — NICOTINE POLACRILEX 2 MG MT GUM: 2.0000 mg | CHEWING_GUM | OROMUCOSAL | Status: DC | PRN

## 2014-10-15 MED ORDER — PANTOPRAZOLE SODIUM 40 MG PO TBEC: 40.0000 mg | DELAYED_RELEASE_TABLET | Freq: Every day | ORAL | Status: DC

## 2014-10-15 MED ORDER — POTASSIUM CHLORIDE CRYS ER 20 MEQ PO TBCR
40.0000 meq | EXTENDED_RELEASE_TABLET | Freq: Two times a day (BID) | ORAL | Status: DC
  Administered 2014-10-15: 40 meq via ORAL
  Filled 2014-10-15: qty 2

## 2014-10-15 MED ORDER — HYDRALAZINE HCL 20 MG/ML IJ SOLN
5.0000 mg | Freq: Four times a day (QID) | INTRAMUSCULAR | Status: DC | PRN
  Filled 2014-10-15: qty 1

## 2014-10-15 NOTE — Discharge Summary (Signed)
[facility] Discharge Summary  Patient name: Jennifer Foster Medical record number: [id number] Date of birth: [birth year] Age: 69 y.o. Gender: female Date of Admission: 10/14/2014  Date of Discharge: 10/15/2014  Admitting Physician: Zigmund Gottron, MD  Primary Care Provider: Andrena Mews, MD Consultants: Cardiology  Indication for Hospitalization: Chest pain  Discharge Diagnoses/Problem List:  Chest pain Hypertension Hyperlipidemia CKD stage III Thrombcytopenia  Disposition: Home  Discharge Condition: Stable, improved  Discharge Exam:  Temp: [98.1 F (36.7 C)-98.6 F (37 C)] 98.6 F (37 C) (02/13 2104) Pulse Rate: [55-64] 64 (02/13 2104) Resp: [18-22] 18 (02/13 2104) BP: (144-174)/(51-72) 174/69 mmHg (02/13 2104) SpO2: [95 %-98 %] 95 % (02/13 2104) Physical Exam: General: NAD Cardiovascular: RRR, no m/r/g, 2+ radial pulses bilaterally Respiratory: CTAB, normal effort Abdomen: S/NT/ND Extremities: Trace LE edema with no calf tenderness or asymmetry  Brief Hospital Course:  Jennifer Foster is a 69 y.o. female presenting with three day history of epigastric/chest pain. PMH is significant for HTN, HLD, tobacco abuse, reflux, depression, anxiety, h/o cocaine abuse, and CKD III.  Chest pain - thought to be most likely MSK with worsening with movement and heavy pulling during dog walking. GI also consideration with improvement with GI cocktail, burping, and h/o GERD. Pt has heart score 4 with significant risk factors so troponins cycled (negative) and EKG done. EKG showed LVH and strain that was seen on prior echo. CXR was unremarkable. Cardiology agreed this was likely MSK vs GI chest pain and planned to arrange outpatient stress testing within ~1 week. TSH and lipid panel obtained and recommended increased exercise and decreased carbohydrate intake. Pt stated pain had resolved by day of discharge and was discharged on amlodipine and coreg,  aspirin, and crestor. Discussed smoking cessation, rx'ed nicotine replacement gum, and provided pt with QUIT line information. She was also discharged on PPI, told to use maalox for gas and avoid caffeine, carbonated beverages, and NSAIDs.  Hypokalemia - Unclear etiology as pt otherwise doing well, no emesis, and not on diuretic. Dropped to 2.8 and 20mEq PO x 2 dosed and rechecked prior to discharge, improving to 3.2.   Issues for Follow Up:  1. F/u with cardiology for outpatient stress test 2. Work on tobacco cessation, exercise 3. F/u K, Hgb and PLT (PLT 156>>134, Hgb dropped 1 point)  Significant Procedures: None  Significant Labs and Imaging:   Recent Labs Lab 10/14/14 0858 10/15/14 0410  WBC 4.3 5.3  HGB 11.7* 10.7*  HCT 33.4* 31.4*  PLT 156 134*    Recent Labs Lab 10/14/14 0858 10/15/14 0410 10/15/14 1455  NA 143 142 140  K 3.6 2.8* 3.2*  CL 107 107 105  CO2 32 31 25  GLUCOSE 107* 106* 97  BUN 17 16 16   CREATININE 1.31* 1.42* 1.60*  CALCIUM 8.5 8.6 9.3  ALKPHOS 59  --   --   AST 25  --   --   ALT 18  --   --   ALBUMIN 3.2*  --   --    UDS-  neg   Results/Tests Pending at Time of Discharge: None  Discharge Medications:    Medication List    STOP taking these medications        naproxen 500 MG tablet  Commonly known as:  NAPROSYN      TAKE these medications        albuterol 108 (90 BASE) MCG/ACT inhaler  Commonly known as:  PROVENTIL HFA;VENTOLIN HFA  Inhale  2 puffs into the lungs every 6 (six) hours as needed for wheezing or shortness of breath.     amLODipine 10 MG tablet  Commonly known as:  NORVASC  Take 1 tablet (10 mg total) by mouth daily.     aspirin 81 MG EC tablet  Take 1 tablet (81 mg total) by mouth daily.     carvedilol 12.5 MG tablet  Commonly known as:  COREG  Take 1 tablet (12.5 mg total) by mouth 2 (two) times daily with a meal.     HYDROcodone-acetaminophen 5-325 MG per tablet  Commonly known as:  NORCO  Take 1 tablet by  mouth every 6 (six) hours as needed for moderate pain.     nicotine polacrilex 2 MG gum  Commonly known as:  NICORETTE  Take 1 each (2 mg total) by mouth as needed for smoking cessation.     pantoprazole 40 MG tablet  Commonly known as:  PROTONIX  Take 1 tablet (40 mg total) by mouth daily.     polyethylene glycol packet  Commonly known as:  MIRALAX / GLYCOLAX  Take 17 g by mouth daily as needed.     rosuvastatin 20 MG tablet  Commonly known as:  CRESTOR  Take 1 tablet (20 mg total) by mouth at bedtime.        Discharge Instructions: Please refer to Patient Instructions section of EMR for full details.  Patient was counseled important signs and symptoms that should prompt return to medical care, changes in medications, dietary instructions, activity restrictions, and follow up appointments.   Follow-Up Appointments: Follow-up Information    Follow up with Jenkins Rouge, MD. Schedule an appointment as soon as possible for a visit in 1 week.   Specialty:  Cardiology   Why:  for a hospital follow up   Contact information:   [address] [address] [phone]       Follow up with [facility]. Schedule an appointment as soon as possible for a visit in 3 days.   Why:  for hospital follow up   Contact information:   [place] SSN-[id number]       Hilton Sinclair, MD 10/15/2014, 5:23 PM PGY-3, [place]

## 2014-10-15 NOTE — Progress Notes (Signed)
Discharge instructions reviewed with patient and all questions answered. IV discontinued and patient walked out to vehicle by RN.

## 2014-10-15 NOTE — Consult Note (Signed)
CARDIOLOGY CONSULT NOTE       Patient ID: KD BORUCH MRN: [id number] DOB/AGE: 1946-06-16 69 y.o.  Admit date: 10/14/2014 Referring Physician:  Leveda Anna Primary Physician: Janit Pagan, MD Primary Cardiologist:  New/Nanie Dunkleberger Reason for Consultation:  Chest Pain   Principal Problem:   Chest pain Active Problems:   Generalized anxiety disorder   TOBACCO USER   HYPERTENSION, BENIGN ESSENTIAL   Gastritis and gastroduodenitis   CHRONIC KIDNEY DISEASE STAGE III (MODERATE)   SHOULDER PAIN   Hyperlipidemia with target LDL less than 100   Cocaine use   Impaired fasting glucose   Depression   Lower GI bleed   Pain in the chest   HPI:   Jennifer Foster is a 69 y.o. female presenting with three day history of epigastric/chest pain. PMH is significant for HTN, HLD, tobacco abuse, reflux, depression, anxiety, h/o cocaine abuse, and CKD III. Pain free this am with negative troponin  Has been ambulating around 2W.  Pain with movement and palpation and when her dog was pulling her.  Also history of GERD with epigastric pain improved with GI coctail.  ECG with LVH and strain similar to ECG 2015  And has had echo in 2014 showing normal EF with severe LVH.  CXR with stable cardiomegaly and normal mediastinum     ROS All other systems reviewed and negative except as noted above  Past Medical History  Diagnosis Date  . Hypertension   . Joint pain   . GERD (gastroesophageal reflux disease)   . Hyperlipemia   . History of asthma   . History of anemia   . Lipoma   . Asthma     Family History  Problem Relation Age of Onset  . Diabetes type II    . Kidney failure    . Kidney failure Son     History   Social History  . Marital Status: Married    Spouse Name: N/A  . Number of Children: N/A  . Years of Education: N/A   Occupational History  . unemployed    Social History Main Topics  . Smoking status: Current Every Day Smoker -- 0.25 packs/day    Types: Cigarettes  . Smokeless  tobacco: Never Used  . Alcohol Use: 0.0 oz/week     Comment: occasionally  . Drug Use: No  . Sexual Activity: Yes   Other Topics Concern  . Not on file   Social History Narrative   Lives with husband Latifa Voorhis who is also an [facility] patient.  History of Marijuana and crack use.  Had cocaine + in May 2012    Past Surgical History  Procedure Laterality Date  . Abdominal hysterectomy  1981    partial, per pt history  . Lipoma excision  01/28/11    neck  . Colonoscopy N/A 12/08/2013    Procedure: COLONOSCOPY;  Surgeon: Theda Belfast, MD;  Location: [facility] ENDOSCOPY;  Service: Endoscopy;  Laterality: N/A;  . Esophagogastroduodenoscopy N/A 12/08/2013    Procedure: ESOPHAGOGASTRODUODENOSCOPY (EGD);  Surgeon: Theda Belfast, MD;  Location: [facility] ENDOSCOPY;  Service: Endoscopy;  Laterality: N/A;  . Givens capsule study N/A 12/08/2013    Procedure: GIVENS CAPSULE STUDY;  Surgeon: Theda Belfast, MD;  Location: [facility] ENDOSCOPY;  Service: Endoscopy;  Laterality: N/A;     . amLODipine  10 mg Oral Daily  . aspirin EC  81 mg Oral Daily  . carvedilol  12.5 mg Oral BID WC  . pantoprazole  40 mg Oral Daily  .  potassium chloride  40 mEq Oral BID  . rosuvastatin  20 mg Oral QHS      Physical Exam: Blood pressure 158/55, pulse 55, temperature 98.8 F (37.1 C), temperature source Oral, resp. rate 18, height 5\' 4"  (1.626 m), weight 86.1 kg (189 lb 13.1 oz), SpO2 97 %.   Affect appropriate Chronically ill black female  HEENT: normal Neck supple with no adenopathy JVP normal no bruits no thyromegaly Lungs clear with no wheezing and good diaphragmatic motion Heart:  S1/S2 S4 SEM  murmur, no rub, gallop or click PMI normal Abdomen: benighn, BS positve, no tenderness, no AAA no bruit.  No HSM or HJR Distal pulses intact with no bruits No edema Neuro non-focal Skin warm and dry No muscular weakness   Labs:   Lab Results  Component Value Date   WBC 5.3 10/15/2014   HGB 10.7* 10/15/2014   HCT 31.4*  10/15/2014   MCV 81.3 10/15/2014   PLT 134* 10/15/2014    Recent Labs Lab 10/14/14 0858 10/15/14 0410  NA 143 142  K 3.6 2.8*  CL 107 107  CO2 32 31  BUN 17 16  CREATININE 1.31* 1.42*  CALCIUM 8.5 8.6  PROT 6.8  --   BILITOT 0.8  --   ALKPHOS 59  --   ALT 18  --   AST 25  --   GLUCOSE 107* 106*   Lab Results  Component Value Date   TROPONINI 0.03 10/15/2014    Lab Results  Component Value Date   CHOL 139 10/14/2014   CHOL 205* 07/05/2013   CHOL 129 02/10/2012   Lab Results  Component Value Date   HDL 37* 10/14/2014   HDL 42 07/05/2013   HDL 34* 02/10/2012   Lab Results  Component Value Date   LDLCALC 49 10/14/2014   LDLCALC 133* 07/05/2013   LDLCALC 47 02/10/2012   Lab Results  Component Value Date   TRIG 264* 10/14/2014   TRIG 152* 07/05/2013   TRIG 239* 02/10/2012   Lab Results  Component Value Date   CHOLHDL 3.8 10/14/2014   CHOLHDL 4.9 07/05/2013   CHOLHDL 3.8 02/10/2012   Lab Results  Component Value Date   LDLDIRECT 88 07/16/2011      Radiology: Dg Chest 2 View  10/14/2014   CLINICAL DATA:  CP (slightly to the left from mid-sternal) for the past 3 days, ongoing SOB, cough and congestion (for a while). Hx smoker for 40+ years at a rate of 2-3 dailyHx asthma, HTN - meds  EXAM: CHEST - 2 VIEW  COMPARISON:  12/08/2013  FINDINGS: Mild cardiomegaly.  Tortuous thoracic aorta.  Lungs are clear. No effusion. Visualized skeletal structures are unremarkable.  IMPRESSION: 1. Stable mild cardiomegaly. 2. No acute disease.   Electronically Signed   By: Corlis Leak M.D.   On: 10/14/2014 10:00    EKG:  SR LVH with strain minor change from 2015    ASSESSMENT AND PLAN:  Chest pain:  Atypical improved with GI cocktail and history of GERD.  ECG with LVH and strain previous echo with LVH and strain.  R/O CXR ok  Pain free Ok to d/c home with outpatient f/u family practice for chronic medical issues.  Will arrane outpatient stress testing at [address] in next  week or so   HTN:  Continue two drug Rx   Chol:  On statin LDL 49   GERD:  D/c with PPI and GI f/u as needed   Signed: Charlton Haws  10/15/2014, 9:51 AM

## 2014-10-15 NOTE — Discharge Instructions (Signed)
Your pain is likely from your intestinal tract and stomach and is likely reflux.  Start pantoprazole daily. Stop naproxen, ibuprofen, or any other NSAID medicines. Do not take BC or Goody's powder. Keep sodas/caffeine to a minimum. Take mylanta as needed.  This could also be from muscle strain so try not to let your dog pull the leash too hard.  We do want you to follow up with the cardiologist. They are planning follow up with you in about 1 week. If you have not heard from them by middle of this week, call us. Continue to work on quitting smoking. 1-800-QUIT-NOW is a great free resource for ideas on how to quit and support. People have much better success quitting when they use support like this regularly. Take aspirin and your cholesterol medicine every day.  If you have severe chest pain with trouble breathing, dizziness, fainting, nausea, or other concerns, seek immediate care.  Follow up with Family Medicine early this week. Call Monday to make this appointment.

## 2014-10-15 NOTE — Progress Notes (Signed)
Family Medicine Teaching Service Daily Progress Note Intern Pager: [phone]  Patient name: Jennifer Foster Medical record number: [id number] Date of birth: 1945/10/26 Age: 69 y.o. Gender: female  Primary Care Provider: Andrena Mews, MD Consultants: None Code Status: DNR per discussion on admission  Pt Overview and Major Events to Date:   Assessment and Plan:  RAECHELL FOELLER is a 69 y.o. female presenting with three day history of epigastric/chest pain. PMH is significant for HTN, HLD, tobacco abuse, reflux, depression, anxiety, h/o cocaine abuse, and CKD III.  # Epigastric/Chest Pain- Most likely musculoskeletal (pain with movement and palpation, heavy pulling during dogwalking) and GI (history of GERD, similar pain, improvement with GI cocktail), with cardiac etiology (istat troponin negative, EKG unchanged) in differential due to Heart score 4 (risk factors, age). CXR with stable cardiomegaly and no evidence of acute cardiopulmonary disease. No LE asymmetry and no hypoxemia/tachycardia making PE very unlikely. No overlying rash makes shingles unlikely. - EKG in AM with ?new incomplete LBBB and new T wave inversions in V3-V6, troponins neg x 2. Though still likely MSK / GERD related pain, consulting cardiology for recs on further workup of EKG changes in pt with Heart score 4. Appreciate recs. - Risk stratification labs (TSH nl, A1C, Lipid Panel T chol 139, LDL 49, HDL 37, TG 264) >> Recommend increased exercise for HDL and decreased carbs for TG. Continue home crestor. - UDS neg - Aspirin 81mg   - Continue Coreg 12.5mg  BID - Tylenol PRN mild pain, Norco PRN moderate pain, Aspercreme PRN and heating pad; NTG patch in place on chest - GI cocktail PRN - Discussed weight and tobacco cessation (see below). - Monitor for skin changes that would bump shingles higher in ddx.  #HTN: Home medications include Amlodipine 10mg  and Coreg 12.5mg  BID - Hydralazine PRN added for morning BPs around [id number]  systolic - If hypotension develops, consider removal of Nitro patch  - Continue home medications. - TED hose  # Hyperlipidemia- Home medication Crestor 20mg  - Lipid panel above. - Continue home medication  # CKD Stage III- Creatinine 1.31 at admission. Baseline 1.3-1.5. GFR 48. Strong family history of kidney disease requiring dialysis. - Follow up am BMP - Continue to monitor  # Tobacco Cessation- Smokes three cigarettes/day. Interested in quitting. - Nicotine gum PRN - Continue to counsel on cessation - Will list QUIT line in discharge instructions  # Thrombocytopenia - PLT dropped to 134 today from 156. No fluid given in ED. No heparin given due to h/o GI bleed.  - Recheck tomorrow along with hemoglobin which dropped 1 point  FEN/GI: [place], Heart Healthy Diet - Hypokalemia 2.8 - will dose kdur 2mEq PO x 2 and recheck this afternoon 4pm Prophylaxis: No heparin given ?GI component and report of h/o GI bleed (chart shows small nonbleeding AVNs); SCDs.   Disposition: If cardiology not to pursue further workup, will plan for d/c today with recommendation to take maalox, continue PPI, and avoid NSAIDs/sodas/caffeine  Subjective:  Patient reports good night sleep and burping lots   Objective: Temp:  [98.1 F (36.7 C)-98.6 F (37 C)] 98.6 F (37 C) (02/13 2104) Pulse Rate:  [55-64] 64 (02/13 2104) Resp:  [18-22] 18 (02/13 2104) BP: (144-174)/(51-72) 174/69 mmHg (02/13 2104) SpO2:  [95 %-98 %] 95 % (02/13 2104) Physical Exam: General: NAD Cardiovascular: RRR, no m/r/g, 2+ radial pulses bilaterally Respiratory: CTAB, normal effort Abdomen: S/NT/ND Extremities: Trace LE edema with no calf tenderness or asymmetry  Laboratory:  Recent Labs  Lab 10/14/14 0858  WBC 4.3  HGB 11.7*  HCT 33.4*  PLT 156    Recent Labs Lab 10/14/14 0858  NA 143  K 3.6  CL 107  CO2 32  BUN 17  CREATININE 1.31*  CALCIUM 8.5  PROT 6.8  BILITOT 0.8  ALKPHOS 59  ALT 18  AST 25   GLUCOSE 107*    Hilton Sinclair, MD 10/15/2014, 4:41 AM PGY-3, [place] Intern pager: [phone], text pages welcome

## 2014-10-15 NOTE — Progress Notes (Signed)
Utilization Review Completed.   Gibson Lad, RN, BSN Nurse Case Manager  

## 2014-10-16 LAB — HEMOGLOBIN A1C
Hgb A1c MFr Bld: 5.2 % (ref 4.8–5.6)
Mean Plasma Glucose: 103 mg/dL

## 2014-10-18 ENCOUNTER — Telehealth: Payer: Self-pay | Admitting: *Deleted

## 2014-10-18 NOTE — Telephone Encounter (Signed)
Harleen, [place] A - 10/14/14 >','<< Less Detail',event)" href="javascript:;">More Detail >>   Josue Hector, MD   Sent: Sun October 15, 2014 10:00 AM    To: Richmond Campbell, LPN        Message     Needs outpatient lexiscan myovue next week        Patient Information     Patient Name Sex DOB SSN    Lydiah, Lauder Female [birth year] [id number]        Consult Note by Josue Hector, MD at 10/15/2014 9:51 AM     Author: Josue Hector, MD Service: Cardiology Author Type: Physician    Filed: 10/15/2014 9:59 AM Note Time: 10/15/2014 9:51 AM Status: Signed    Editor: Josue Hector, MD (Physician)      Expand All Collapse All   CARDIOLOGY CONSULT NOTE       Patient ID: CARLEENA NEUPERT MRN: [id number] DOB/AGE: August 16, 1946 69 y.o.  Admit date: 10/14/2014 Referring Physician: Andria Frames Primary Physician: Andrena Mews, MD Primary Cardiologist: New/Nishan Reason for Consultation: Chest Pain   Principal Problem:  Chest pain Active Problems:  Generalized anxiety disorder  TOBACCO USER  HYPERTENSION, BENIGN ESSENTIAL  Gastritis and gastroduodenitis  CHRONIC KIDNEY DISEASE STAGE III (MODERATE)  SHOULDER PAIN  Hyperlipidemia with target LDL less than 100  Cocaine use  Impaired fasting glucose  Depression  Lower GI bleed  Pain in the chest   HPI: JESSACA BEH is a 69 y.o. female presenting with three day history of epigastric/chest pain. PMH is significant for HTN, HLD, tobacco abuse, reflux, depression, anxiety, h/o cocaine abuse, and CKD III. Pain free this am with negative troponin Has been ambulating around 2W. Pain with movement and palpation and when her dog was pulling her. Also history of GERD with epigastric pain improved with GI coctail. ECG with LVH and strain similar to ECG 2015 And has had echo in 2014 showing normal EF with severe LVH. CXR with stable cardiomegaly and normal mediastinum     ROS All other  systems reviewed and negative except as noted above  Past Medical History  Diagnosis Date  . Hypertension   . Joint pain   . GERD (gastroesophageal reflux disease)   . Hyperlipemia   . History of asthma   . History of anemia   . Lipoma   . Asthma     Family History  Problem Relation Age of Onset  . Diabetes type II    . Kidney failure    . Kidney failure Son     History   Social History  . Marital Status: Married    Spouse Name: N/A  . Number of Children: N/A  . Years of Education: N/A   Occupational History  . unemployed    Social History Main Topics  . Smoking status: Current Every Day Smoker -- 0.25 packs/day    Types: Cigarettes  . Smokeless tobacco: Never Used  . Alcohol Use: 0.0 oz/week     Comment: occasionally  . Drug Use: No  . Sexual Activity: Yes   Other Topics Concern  . Not on file   Social History Narrative   Lives with husband Kendyle Perin who is also an [facility] patient. History of Marijuana and crack use. Had cocaine + in May 2012    Past Surgical History  Procedure Laterality Date  . Abdominal hysterectomy  1981    partial, per pt history  . Lipoma excision  01/28/11    neck  .  Colonoscopy N/A 12/08/2013    Procedure: COLONOSCOPY; Surgeon: [name], MD; Location: [place]; Service: Endoscopy; Laterality: N/A;  . Esophagogastroduodenoscopy N/A 12/08/2013    Procedure: ESOPHAGOGASTRODUODENOSCOPY (EGD); Surgeon: [name], MD; Location[facility] ENDOSCOPY; Service: Endoscopy; Laterality: N/A;  . Givens capsule study N/A 12/08/2013    Procedure: GIVENS CAPSULE STUDY; Surgeon: [name], MD; Location: [place]; Service: Endoscopy; Laterality: N/A;     . amLODipine 10 mg Oral Daily  . aspirin EC 81 mg Oral Daily  . carvedilol 12.5 mg Oral BID WC  .  pantoprazole 40 mg Oral Daily  . potassium chloride 40 mEq Oral BID  . rosuvastatin 20 mg Oral QHS      Physical Exam: Blood pressure 158/55, pulse 55, temperature 98.8 F (37.1 C), temperature source Oral, resp. rate 18, height 5\' 4"  (1.626 m), weight 86.1 kg (189 lb 13.1 oz), SpO2 97 %.   Affect appropriate Chronically ill black female  HEENT: normal Neck supple with no adenopathy JVP normal no bruits no thyromegaly Lungs clear with no wheezing and good diaphragmatic motion Heart: S1/S2 S4 SEM murmur, no rub, gallop or click PMI normal Abdomen: benighn, BS positve, no tenderness, no AAA no bruit. No HSM or HJR Distal pulses intact with no bruits No edema Neuro non-focal Skin warm and dry No muscular weakness   Labs:  Lab Results  Component Value Date   WBC 5.3 10/15/2014   HGB 10.7* 10/15/2014   HCT 31.4* 10/15/2014   MCV 81.3 10/15/2014   PLT 134* 10/15/2014    Recent Labs Lab 10/14/14 0858 10/15/14 0410  NA 143 142  K 3.6 2.8*  CL 107 107  CO2 32 31  BUN 17 16  CREATININE 1.31* 1.42*  CALCIUM 8.5 8.6  PROT 6.8 --   BILITOT 0.8 --   ALKPHOS 59 --   ALT 18 --   AST 25 --   GLUCOSE 107* 106*    Recent Labs    Lab Results  Component Value Date   TROPONINI 0.03 10/15/2014       Recent Labs    Lab Results  Component Value Date   CHOL 139 10/14/2014   CHOL 205* 07/05/2013   CHOL 129 02/10/2012      Recent Labs    Lab Results  Component Value Date   HDL 37* 10/14/2014   HDL 42 07/05/2013   HDL 34* 02/10/2012      Recent Labs    Lab Results  Component Value Date   LDLCALC 49 10/14/2014   LDLCALC 133* 07/05/2013   LDLCALC 47 02/10/2012      Recent Labs    Lab Results  Component Value Date   TRIG 264* 10/14/2014   TRIG 152* 07/05/2013   TRIG 239* 02/10/2012       Recent Labs    Lab Results  Component Value Date   CHOLHDL 3.8 10/14/2014   CHOLHDL 4.9 07/05/2013   CHOLHDL 3.8 02/10/2012      Recent Labs    Lab Results  Component Value Date   LDLDIRECT 88 07/16/2011       Radiology:  Imaging Results    Dg Chest 2 View  10/14/2014 CLINICAL DATA: CP (slightly to the left from mid-sternal) for the past 3 days, ongoing SOB, cough and congestion (for a while). Hx smoker for 40+ years at a rate of 2-3 dailyHx asthma, HTN - meds EXAM: CHEST - 2 VIEW COMPARISON: 12/08/2013 FINDINGS: Mild cardiomegaly. Tortuous thoracic aorta. Lungs are  clear. No effusion. Visualized skeletal structures are unremarkable. IMPRESSION: 1. Stable mild cardiomegaly. 2. No acute disease. Electronically Signed By: Lucrezia Europe M.D. On: 10/14/2014 10:00     EKG: SR LVH with strain minor change from 2015    ASSESSMENT AND PLAN:  Chest pain: Atypical improved with GI cocktail and history of GERD. ECG with LVH and strain previous echo with LVH and strain. R/O CXR ok Pain free Ok to d/c home with outpatient f/u family practice for chronic medical issues. Will arrane outpatient stress testing at [address] in next week or so  HTN: Continue two drug Rx  Chol: On statin LDL 49  GERD: D/c with PPI and GI f/u as needed   Signed: Jenkins Rouge 10/15/2014, 9:51 AM             LM TO CALL BACK  .Adonis Housekeeper

## 2014-10-20 ENCOUNTER — Other Ambulatory Visit: Payer: Self-pay | Admitting: *Deleted

## 2014-10-20 DIAGNOSIS — R079 Chest pain, unspecified: Secondary | ICD-10-CM

## 2014-10-20 NOTE — Telephone Encounter (Signed)
PT  AWARE  WILL SEND  MESSAGE  TO   SCHEDULERS  TO MAKE APPT  FOR  GXT  .Adonis Housekeeper

## 2014-11-06 ENCOUNTER — Other Ambulatory Visit: Payer: Self-pay | Admitting: Physician Assistant

## 2014-11-06 ENCOUNTER — Ambulatory Visit (INDEPENDENT_AMBULATORY_CARE_PROVIDER_SITE_OTHER): Payer: Commercial Managed Care - HMO

## 2014-11-06 DIAGNOSIS — R079 Chest pain, unspecified: Secondary | ICD-10-CM | POA: Diagnosis not present

## 2014-11-06 NOTE — Progress Notes (Signed)
Patient arrived for ETT. Baseline BP [id number] systolic. Pt was advised to hold meds in prep for test. No acute complaints. EKG NSR with LVH and TWI consistent with strain. Review of chart indicates this test was intended to be ordered as a Lexicographer per Dr. Kyla Balzarine telephone note. Given elevated BP and abnormal baseline EKG, will cancel ETT and reschedule for previously recommended Lexiscan myoview. Patient wary of using treadmill due to recent trip/fall, so she is agreeable to the above plan. I apologized for the miscommunication. She will resume prior medications and will be scheduled for next available Lexiscan nuclear stress test. Melina Copa PA-C

## 2014-11-06 NOTE — Progress Notes (Deleted)
Exercise Treadmill Test  Pre-Exercise Testing Evaluation Rhythm: sinus bradycardia  Rate: 55     Test  Exercise Tolerance Test Ordering MD: Jenkins Rouge, MD  Interpreting MD: Melina Copa, PA-C  Unique Test No: 1  Treadmill:  1  Indication for ETT: chest pain - rule out ischemia  Contraindication to ETT: No   Stress Modality: exercise - treadmill  Cardiac Imaging Performed: non   Protocol: standard Bruce - maximal  Max BP:  ***/***  Max MPHR (bpm):  154 85% MPR (bpm):  131  MPHR obtained (bpm):  *** % MPHR obtained:  ***  Reached 85% MPHR (min:sec):  *** Total Exercise Time (min-sec):  ***  Workload in METS:  *** Borg Scale: ***  Reason ETT Terminated:  {CHL REASON TERMINATED FOR [id number]    ST Segment Analysis At Rest: {CHL ST SEGMENT AT REST FOR [id number] With Exercise: {CHL ST SEGMENT WITH EXERCISE FOR [id number]  Other Information Arrhythmia:  {CHL ARRHYTHMIA FOR [id number] Angina during ETT:  {CHL ANGINA DURING [id number] Quality of ETT:  {CHL QUALITY OF [id number]  ETT Interpretation:  {CHL INTERPRETATION FOR [id number]  Comments: ***  Recommendations: ***

## 2014-11-07 ENCOUNTER — Other Ambulatory Visit: Payer: Self-pay | Admitting: *Deleted

## 2014-11-13 ENCOUNTER — Other Ambulatory Visit: Payer: Self-pay | Admitting: *Deleted

## 2014-11-13 MED ORDER — AMLODIPINE BESYLATE 10 MG PO TABS: 10.0000 mg | ORAL_TABLET | Freq: Every day | ORAL | Status: DC

## 2014-11-14 ENCOUNTER — Encounter (HOSPITAL_COMMUNITY): Payer: Commercial Managed Care - HMO

## 2014-11-15 ENCOUNTER — Ambulatory Visit (HOSPITAL_COMMUNITY): Payer: Commercial Managed Care - HMO

## 2014-11-15 ENCOUNTER — Other Ambulatory Visit: Payer: Self-pay | Admitting: Family Medicine

## 2014-11-20 ENCOUNTER — Telehealth: Payer: Self-pay | Admitting: Family Medicine

## 2014-11-20 NOTE — Telephone Encounter (Signed)
Pt called because she was on a inflammation medication that was given to her in the hospital and has no refills on this. She would like the doctor to call a refill in for her. jw

## 2014-11-21 NOTE — Telephone Encounter (Signed)
Spoke with patient and she is aware of appt.  She was not pleased to hear that she would have to wait until 12/08/14 to be seen for her pain and swelling.  I did offer her an appt sooner to be seen but it was declined.  Pt states that she has an appt tomorrow for a stress test and will ask them tomorrow. Alexxus Sobh,CMA

## 2014-11-21 NOTE — Telephone Encounter (Signed)
Tried contacting pt and phone kept ringing and ringing and no answer and unable to leave VM due to no setup. Will try again later. Bow Buntyn, CMA.

## 2014-11-21 NOTE — Telephone Encounter (Signed)
Spoke with pt and she stated that she needed a refill on pain med (Hydrocodone-acetaminophen) that was prescribed to her in hospital in 10/2014. I advised her that this may not be refilled until she comes in for OV (which she has appt with you on 12/08/14 @8 :45am) but that I would still send this message back to you. I offered her an earlier appt for this but she did not want to do that. She stated that she may go to the hospital anyway because she has some pain and swelling in her legs but still wanted to check to see if you would refill first. Please advise. Tiaja Hagan, CMA.

## 2014-11-21 NOTE — Telephone Encounter (Signed)
Hydrocodone was given for a short period, let her know I don't want her to continue of this medication, I will see her at follow up.

## 2014-11-22 ENCOUNTER — Ambulatory Visit (HOSPITAL_COMMUNITY): Payer: Commercial Managed Care - HMO | Attending: Cardiology | Admitting: Radiology

## 2014-11-22 VITALS — BP 163/81 | Ht 64.0 in | Wt 195.0 lb

## 2014-11-22 DIAGNOSIS — R079 Chest pain, unspecified: Secondary | ICD-10-CM

## 2014-11-22 MED ORDER — TECHNETIUM TC 99M SESTAMIBI GENERIC - CARDIOLITE
11.0000 | Freq: Once | INTRAVENOUS | Status: AC | PRN
  Administered 2014-11-22: 11 via INTRAVENOUS

## 2014-11-22 MED ORDER — TECHNETIUM TC 99M SESTAMIBI GENERIC - CARDIOLITE
33.0000 | Freq: Once | INTRAVENOUS | Status: AC | PRN
  Administered 2014-11-22: 33 via INTRAVENOUS

## 2014-11-22 MED ORDER — REGADENOSON 0.4 MG/5ML IV SOLN
0.4000 mg | Freq: Once | INTRAVENOUS | Status: AC
  Administered 2014-11-22: 0.4 mg via INTRAVENOUS

## 2014-11-22 NOTE — Progress Notes (Signed)
[place] [facility] [address] [place], [place] [address] [phone]    Cardiology Nuclear Med Study  Jennifer Foster is a 69 y.o. female     MRN : [id number]     DOB: 09-18-1945  Procedure Date: 11/22/2014  Nuclear Med Background Indication for Stress Test:  Evaluation for Ischemia, [facility], Abnormal EKG and LVH with Strain, 10/15/2014 ED for CP (-) Troponins History:  Asthma, Cocaine use, Lipids Cardiac Risk Factors: Hypertension and NIDDM  Symptoms:  Chest Pain   Nuclear Pre-Procedure Caffeine/Decaff Intake:  None NPO After: 9:00pm   Lungs:  clear O2 Sat: 95% on room air. IV 0.9% NS with Angio Cath:  22g  IV Site: L Antecubital  IV Started by:  Perrin Maltese, EMT-P  Chest Size (in):  36 Cup Size: C  Height: 5\' 4"  (1.626 m)  Weight:  195 lb (88.451 kg)  BMI:  Body mass index is 33.46 kg/(m^2). Tech Comments:  Rx this am    Nuclear Med Study 1 or 2 day study: 1 day  Stress Test Type:  Carlton Adam  Reading MD: n/a  Order Authorizing Provider:  P.Nishan MD  Resting Radionuclide: Technetium 19m Sestamibi  Resting Radionuclide Dose: 11.0 mCi   Stress Radionuclide:  Technetium 64m Sestamibi  Stress Radionuclide Dose: 33.0 mCi           Stress Protocol Rest HR: 55 Stress HR: 69  Rest BP: 163/81 Stress BP: 149/63  Exercise Time (min): n/a METS: n/a   Predicted Max HR: 154 bpm % Max HR: 44.81 bpm Rate Pressure Product: 11247   Dose of Adenosine (mg):  n/a Dose of Lexiscan: 0.4 mg  Dose of Atropine (mg): n/a Dose of Dobutamine: n/a mcg/kg/min (at max HR)  Stress Test Technologist: Perrin Maltese, EMT-P  Nuclear Technologist:  Earl Many, CNMT     Rest Procedure:  Myocardial perfusion imaging was performed at rest 45 minutes following the intravenous administration of Technetium 75m Sestamibi. Rest ECG: Sinus bradycardia rate 54 with left ventricle hypertrophy, T-wave changes suggestive of repolarization abnormality  Stress Procedure:   The patient received IV Lexiscan 0.4 mg over 15-seconds.  Technetium 81m Sestamibi injected at 30-seconds. This patient was sob, had stomach cramps and felt woozy with the Lexiscan injection. Quantitative spect images were obtained after a 45 minute delay. Stress ECG: No significant change from baseline ECG  QPS Raw Data Images:  Normal; no motion artifact; normal heart/lung ratio. Stress Images:  Normal homogeneous uptake in all areas of the myocardium. Rest Images:  Normal homogeneous uptake in all areas of the myocardium. Subtraction (SDS):  No evidence of ischemia. Transient Ischemic Dilatation (Normal <1.22):  1.00 Lung/Heart Ratio (Normal <0.45):  0.23  Quantitative Gated Spect Images QGS EDV:  134 ml QGS ESV:  59 ml  Impression Exercise Capacity:  Lexiscan with no exercise. BP Response:  Normal blood pressure response. Clinical Symptoms:  No significant symptoms noted. ECG Impression:  No significant ST segment change suggestive of ischemia. Comparison with Prior Nuclear Study: No images to compare  Overall Impression:  Normal stress nuclear study. overall low risk with no evidence of significant ischemia identified. Left ventricular hypertrophy suggested by increased wall thickness seen on perfusion images.  LV Ejection Fraction: 56%.  LV Wall Motion:  NL LV Function; NL Wall Motion   Candee Furbish, MD

## 2014-12-05 ENCOUNTER — Encounter: Payer: Commercial Managed Care - HMO | Admitting: Nurse Practitioner

## 2014-12-08 ENCOUNTER — Ambulatory Visit: Payer: Commercial Managed Care - HMO | Admitting: Family Medicine

## 2014-12-12 ENCOUNTER — Ambulatory Visit (INDEPENDENT_AMBULATORY_CARE_PROVIDER_SITE_OTHER): Payer: Commercial Managed Care - HMO | Admitting: Family Medicine

## 2014-12-12 ENCOUNTER — Encounter: Payer: Self-pay | Admitting: Family Medicine

## 2014-12-12 VITALS — BP 134/66 | HR 54 | Temp 98.3°F | Ht 64.0 in | Wt 194.6 lb

## 2014-12-12 DIAGNOSIS — I10 Essential (primary) hypertension: Secondary | ICD-10-CM | POA: Diagnosis not present

## 2014-12-12 DIAGNOSIS — E785 Hyperlipidemia, unspecified: Secondary | ICD-10-CM | POA: Diagnosis not present

## 2014-12-12 DIAGNOSIS — Z23 Encounter for immunization: Secondary | ICD-10-CM

## 2014-12-12 DIAGNOSIS — R600 Localized edema: Secondary | ICD-10-CM | POA: Diagnosis not present

## 2014-12-12 DIAGNOSIS — Z0000 Encounter for general adult medical examination without abnormal findings: Secondary | ICD-10-CM

## 2014-12-12 NOTE — Assessment & Plan Note (Signed)
BP looks good today. Previous lab reviewed, she is not due for lab. Continue current regimen.

## 2014-12-12 NOTE — Assessment & Plan Note (Signed)
Prevnar given today. Otherwise up to date with her health maintenance.

## 2014-12-12 NOTE — Progress Notes (Signed)
Subjective:     Patient ID: Jennifer Foster, female   DOB: 1945/09/17, 69 y.o.   MRN: [id number]  HPI  HTN: here for follow up, she is compliant with Norvasc 10mg  qd,Coreg 12.5 mg BID,denies any concern. Here for follow up. HLD:She is compliant with her Crestor. Here for follow up. Leg swelling:Patient is happy that her leg swelling has improved a lot. She stated she cut back on salt all together. She has been propping up her leg more often as well. [id number] update.  Current Outpatient Prescriptions on File Prior to Visit  Medication Sig Dispense Refill  . amLODipine (NORVASC) 10 MG tablet Take 1 tablet (10 mg total) by mouth daily. 30 tablet 3  . aspirin EC 81 MG EC tablet Take 1 tablet (81 mg total) by mouth daily. 30 tablet 1  . carvedilol (COREG) 12.5 MG tablet Take 1 tablet (12.5 mg total) by mouth 2 (two) times daily with a meal. 180 tablet 1  . pantoprazole (PROTONIX) 40 MG tablet take 1 tablet by mouth once daily 30 tablet 2  . rosuvastatin (CRESTOR) 20 MG tablet Take 1 tablet (20 mg total) by mouth at bedtime. 90 tablet 1  . albuterol (PROVENTIL HFA;VENTOLIN HFA) 108 (90 BASE) MCG/ACT inhaler Inhale 2 puffs into the lungs every 6 (six) hours as needed for wheezing or shortness of breath.    . nicotine polacrilex (NICORETTE) 2 MG gum Take 1 each (2 mg total) by mouth as needed for smoking cessation. (Patient not taking: Reported on 12/12/2014) 100 tablet 0  . polyethylene glycol (MIRALAX / GLYCOLAX) packet Take 17 g by mouth daily as needed. (Patient not taking: Reported on 08/11/2014) 14 each 3   No current facility-administered medications on file prior to visit.   Past Medical History  Diagnosis Date  . Hypertension   . Joint pain   . GERD (gastroesophageal reflux disease)   . Hyperlipemia   . History of asthma   . History of anemia   . Lipoma   . Asthma        Review of Systems  Respiratory: Negative.   Cardiovascular: Negative.   Gastrointestinal: Negative.    Genitourinary: Negative.   Neurological: Negative.   All other systems reviewed and are negative.  Filed Vitals:   12/12/14 0906  BP: 134/66  Pulse: 54  Temp: 98.3 F (36.8 C)  TempSrc: Oral  Height: 5\' 4"  (1.626 m)  Weight: 194 lb 9 oz (88.253 kg)        Objective:   Physical Exam  Constitutional: She is oriented to person, place, and time. She appears well-developed. No distress.  Cardiovascular: Normal rate, regular rhythm and normal heart sounds.   No murmur heard. Pulmonary/Chest: Effort normal and breath sounds normal. No respiratory distress. She has no wheezes. She exhibits no tenderness.  Abdominal: Soft. Bowel sounds are normal. She exhibits no distension and no mass. There is no tenderness.  Musculoskeletal: Normal range of motion.  Trace B/L pitting edema  Neurological: She is alert and oriented to person, place, and time.  Psychiatric: She has a normal mood and affect.  Nursing note and vitals reviewed.      Assessment:     HTN: HLD: Leg swelling: HM:    Plan:     Check problem list.

## 2014-12-12 NOTE — Patient Instructions (Signed)
Hypertension Hypertension is another name for high blood pressure. High blood pressure forces your heart to work harder to pump blood. A blood pressure reading has two numbers, which includes a higher number over a lower number (example: 110/72). HOME CARE   Have your blood pressure rechecked by your doctor.  Only take medicine as told by your doctor. Follow the directions carefully. The medicine does not work as well if you skip doses. Skipping doses also puts you at risk for problems.  Do not smoke.  Monitor your blood pressure at home as told by your doctor. GET HELP IF:  You think you are having a reaction to the medicine you are taking.  You have repeat headaches or feel dizzy.  You have puffiness (swelling) in your ankles.  You have trouble with your vision. GET HELP RIGHT AWAY IF:   You get a very bad headache and are confused.  You feel weak, numb, or faint.  You get chest or belly (abdominal) pain.  You throw up (vomit).  You cannot breathe very well. MAKE SURE YOU:   Understand these instructions.  Will watch your condition.  Will get help right away if you are not doing well or get worse. Document Released: 02/04/2008 Document Revised: 08/23/2013 Document Reviewed: 06/10/2013 [employer] Patient Information 2015 [employer]. This information is not intended to replace advice given to you by your health care provider. Make sure you discuss any questions you have with your health care provider.  

## 2014-12-12 NOTE — Assessment & Plan Note (Signed)
Still present but improved from previous visit. Continue to avoid salt intake. F/U prn.

## 2014-12-12 NOTE — Assessment & Plan Note (Signed)
Stable on Crestor, continue same dose. Not due for FLP.

## 2015-02-14 ENCOUNTER — Other Ambulatory Visit: Payer: Self-pay | Admitting: *Deleted

## 2015-02-14 MED ORDER — PANTOPRAZOLE SODIUM 40 MG PO TBEC: 40.0000 mg | DELAYED_RELEASE_TABLET | Freq: Every day | ORAL | Status: DC

## 2015-02-20 ENCOUNTER — Ambulatory Visit (INDEPENDENT_AMBULATORY_CARE_PROVIDER_SITE_OTHER): Payer: Commercial Managed Care - HMO | Admitting: Family Medicine

## 2015-02-20 ENCOUNTER — Encounter: Payer: Self-pay | Admitting: Family Medicine

## 2015-02-20 VITALS — BP 130/65 | HR 64 | Temp 98.0°F | Ht 64.0 in | Wt 188.0 lb

## 2015-02-20 DIAGNOSIS — R51 Headache: Secondary | ICD-10-CM

## 2015-02-20 DIAGNOSIS — L255 Unspecified contact dermatitis due to plants, except food: Secondary | ICD-10-CM

## 2015-02-20 DIAGNOSIS — R519 Headache, unspecified: Secondary | ICD-10-CM | POA: Insufficient documentation

## 2015-02-20 DIAGNOSIS — Z760 Encounter for issue of repeat prescription: Secondary | ICD-10-CM | POA: Diagnosis not present

## 2015-02-20 MED ORDER — PANTOPRAZOLE SODIUM 40 MG PO TBEC: 40.0000 mg | DELAYED_RELEASE_TABLET | Freq: Every day | ORAL | Status: DC

## 2015-02-20 MED ORDER — CARVEDILOL 12.5 MG PO TABS: 12.5000 mg | ORAL_TABLET | Freq: Two times a day (BID) | ORAL | Status: DC

## 2015-02-20 MED ORDER — ROSUVASTATIN CALCIUM 20 MG PO TABS: 20.0000 mg | ORAL_TABLET | Freq: Every day | ORAL | Status: DC

## 2015-02-20 MED ORDER — TRIAMCINOLONE ACETONIDE 0.1 % EX CREA: 1.0000 "application " | TOPICAL_CREAM | Freq: Two times a day (BID) | CUTANEOUS | Status: DC

## 2015-02-20 MED ORDER — AMLODIPINE BESYLATE 10 MG PO TABS: 10.0000 mg | ORAL_TABLET | Freq: Every day | ORAL | Status: DC

## 2015-02-20 NOTE — Assessment & Plan Note (Signed)
I reviewed and refilled medications as appropriate.

## 2015-02-20 NOTE — Assessment & Plan Note (Signed)
No sign of meningeal irritation or neurologic deficit. As discussed with her this might be related to her not wearing her eye glasses. She agreed to contact her ophthalmologist soon. Return precaution discussed. She is currently asymptomatic.

## 2015-02-20 NOTE — Patient Instructions (Signed)
It was nice seeing you, I am sorry about your skin rash, it could be due to exposure to plant causing the rash and itching, I will give you a cream to help with this. You can get benadryl OTC to also help with itching.  Also your intermittent headache could be to the fact that you have not been wearing your eye glasses, please contact your eye doctor for new glasses.

## 2015-02-20 NOTE — Assessment & Plan Note (Signed)
Benadryl recommended prn itching which she already have at home. Triamcinolone ointment prescribed today. Return precaution discussed.

## 2015-02-20 NOTE — Progress Notes (Addendum)
Subjective:     Patient ID: Jennifer Foster, female   DOB: 26-Aug-1946, 69 y.o.   MRN: [id number]  HPI Rash:C/O generalized skin rash which started 2-3 wks ago on and off, itchy. Rash is located on her back, stomach, arms and legs. No fever. She was out in the bush few days earlier prior to the onset of the rash, she felt this might have contributed to her symptoms. Headache: C/O HA X 1 wk, on and off on her right temporal area, currently asymptomatic. She often have the pain in the morning. She prays and the headache goes away. Denies any stress, she denies any injury to her head. No vision change.No N/V. She lost her eye glasses more than 2 months ago other than that no vision change. Med refill: Need refill for Norvasc, Coreg,Protonix and Crestor.  Current Outpatient Prescriptions on File Prior to Visit  Medication Sig Dispense Refill  . albuterol (PROVENTIL HFA;VENTOLIN HFA) 108 (90 BASE) MCG/ACT inhaler Inhale 2 puffs into the lungs every 6 (six) hours as needed for wheezing or shortness of breath.    [name] aspirin EC 81 MG EC tablet Take 1 tablet (81 mg total) by mouth daily. 30 tablet 1  . B Complex-C (B-COMPLEX WITH VITAMIN C) tablet Take 1 tablet by mouth daily.    . nicotine polacrilex (NICORETTE) 2 MG gum Take 1 each (2 mg total) by mouth as needed for smoking cessation. (Patient not taking: Reported on 12/12/2014) 100 tablet 0  . polyethylene glycol (MIRALAX / GLYCOLAX) packet Take 17 g by mouth daily as needed. (Patient not taking: Reported on 08/11/2014) 14 each 3   No current facility-administered medications on file prior to visit.   Past Medical History  Diagnosis Date  . Hypertension   . Joint pain   . GERD (gastroesophageal reflux disease)   . Hyperlipemia   . History of asthma   . History of anemia   . Lipoma   . Asthma      Review of Systems  Respiratory: Negative.   Cardiovascular: Negative.   Gastrointestinal: Negative.   Skin: Positive for rash.  Neurological:  Positive for headaches.  All other systems reviewed and are negative.      Filed Vitals:   02/20/15 1053  BP: 130/65  Pulse: 64  Temp: 98 F (36.7 C)  TempSrc: Oral  Height: 5\' 4"  (1.626 m)  Weight: 188 lb (85.276 kg)    Objective:   Physical Exam  Constitutional: She is oriented to person, place, and time. She appears well-developed. No distress.  Cardiovascular: Normal rate, regular rhythm, normal heart sounds and intact distal pulses.   No murmur heard. Pulmonary/Chest: Effort normal and breath sounds normal. No respiratory distress. She has no wheezes.  Abdominal: Soft. Bowel sounds are normal. She exhibits no mass. There is no tenderness.  Musculoskeletal: Normal range of motion. She exhibits no edema.  Neurological: She is alert and oriented to person, place, and time.  Skin: Rash noted. Rash is maculopapular.     Nursing note and vitals reviewed.      Assessment:     Skin rash: Likely contact dermatitis Headache Med refill      Plan:     Check problem list

## 2015-02-23 ENCOUNTER — Telehealth: Payer: Self-pay | Admitting: Family Medicine

## 2015-02-23 MED ORDER — PAROXETINE HCL 20 MG PO TABS: 20.0000 mg | ORAL_TABLET | Freq: Every day | ORAL | Status: DC

## 2015-02-23 NOTE — Telephone Encounter (Signed)
Patient's husband mentioned to me this morning that she was on certain medicine in thepast which help calm her down, he does not know the name of the medicine. I later called and spoke with patient she stated it was for anxiety. I reviewed her record, she had been on Paxil in the past and she stated she will like toobtain refill of this medicine. I refilled her med.

## 2015-03-22 DIAGNOSIS — Z0100 Encounter for examination of eyes and vision without abnormal findings: Secondary | ICD-10-CM | POA: Diagnosis not present

## 2015-03-22 DIAGNOSIS — I10 Essential (primary) hypertension: Secondary | ICD-10-CM | POA: Diagnosis not present

## 2015-05-26 ENCOUNTER — Encounter (HOSPITAL_COMMUNITY): Payer: Self-pay

## 2015-05-26 ENCOUNTER — Emergency Department (HOSPITAL_COMMUNITY): Payer: Commercial Managed Care - HMO

## 2015-05-26 ENCOUNTER — Emergency Department (HOSPITAL_COMMUNITY)
Admission: EM | Admit: 2015-05-26 | Discharge: 2015-05-26 | Disposition: A | Discharge status: Home / Self Care | Payer: Commercial Managed Care - HMO | Attending: Emergency Medicine | Admitting: Emergency Medicine

## 2015-05-26 DIAGNOSIS — Z7982 Long term (current) use of aspirin: Secondary | ICD-10-CM | POA: Insufficient documentation

## 2015-05-26 DIAGNOSIS — M25562 Pain in left knee: Secondary | ICD-10-CM

## 2015-05-26 DIAGNOSIS — Z79899 Other long term (current) drug therapy: Secondary | ICD-10-CM | POA: Insufficient documentation

## 2015-05-26 DIAGNOSIS — I10 Essential (primary) hypertension: Secondary | ICD-10-CM | POA: Insufficient documentation

## 2015-05-26 DIAGNOSIS — R600 Localized edema: Secondary | ICD-10-CM | POA: Insufficient documentation

## 2015-05-26 DIAGNOSIS — Z720 Tobacco use: Secondary | ICD-10-CM | POA: Insufficient documentation

## 2015-05-26 DIAGNOSIS — Z862 Personal history of diseases of the blood and blood-forming organs and certain disorders involving the immune mechanism: Secondary | ICD-10-CM | POA: Insufficient documentation

## 2015-05-26 DIAGNOSIS — J45909 Unspecified asthma, uncomplicated: Secondary | ICD-10-CM | POA: Insufficient documentation

## 2015-05-26 DIAGNOSIS — E785 Hyperlipidemia, unspecified: Secondary | ICD-10-CM | POA: Diagnosis not present

## 2015-05-26 DIAGNOSIS — K219 Gastro-esophageal reflux disease without esophagitis: Secondary | ICD-10-CM | POA: Diagnosis not present

## 2015-05-26 MED ORDER — HYDROCODONE-ACETAMINOPHEN 5-325 MG PO TABS: 1.0000 | ORAL_TABLET | ORAL | Status: DC | PRN

## 2015-05-26 MED ORDER — PREDNISONE 20 MG PO TABS: 40.0000 mg | ORAL_TABLET | Freq: Every day | ORAL | Status: DC

## 2015-05-26 NOTE — ED Notes (Signed)
She c/o non-traumatic left knee pain; otherwise healthy.

## 2015-05-26 NOTE — ED Notes (Signed)
Pt refuses to have needle and blood work, dr notified.

## 2015-05-26 NOTE — Discharge Instructions (Signed)
1. Medications: prednisone, pain medicine, usual home medications 2. Treatment: rest, drink plenty of fluidds 3. Follow Up: please followup with your primary doctor for discussion of your diagnoses and further evaluation after today's visit; if you do not have a primary care doctor use the resource guide provided to find one; please return to the ER for severe pain, inability to move left leg, numbness or tingling, weakness, new or worsening symptoms   Arthralgia Your caregiver has diagnosed you as suffering from an arthralgia. Arthralgia means there is pain in a joint. This can come from many reasons including:  Bruising the joint which causes soreness (inflammation) in the joint.  Wear and tear on the joints which occur as we grow older (osteoarthritis).  Overusing the joint.  Various forms of arthritis.  Infections of the joint. Regardless of the cause of pain in your joint, most of these different pains respond to anti-inflammatory drugs and rest. The exception to this is when a joint is infected, and these cases are treated with antibiotics, if it is a bacterial infection. HOME CARE INSTRUCTIONS   Rest the injured area for as long as directed by your caregiver. Then slowly start using the joint as directed by your caregiver and as the pain allows. Crutches as directed may be useful if the ankles, knees or hips are involved. If the knee was splinted or casted, continue use and care as directed. If an stretchy or elastic wrapping bandage has been applied today, it should be removed and re-applied every 3 to 4 hours. It should not be applied tightly, but firmly enough to keep swelling down. Watch toes and feet for swelling, bluish discoloration, coldness, numbness or excessive pain. If any of these problems (symptoms) occur, remove the ace bandage and re-apply more loosely. If these symptoms persist, contact your caregiver or return to this location.  For the first 24 hours, keep the injured  extremity elevated on pillows while lying down.  Apply ice for 15-20 minutes to the sore joint every couple hours while awake for the first half day. Then 03-04 times per day for the first 48 hours. Put the ice in a plastic bag and place a towel between the bag of ice and your skin.  Wear any splinting, casting, elastic bandage applications, or slings as instructed.  Only take over-the-counter or prescription medicines for pain, discomfort, or fever as directed by your caregiver. Do not use aspirin immediately after the injury unless instructed by your physician. Aspirin can cause increased bleeding and bruising of the tissues.  If you were given crutches, continue to use them as instructed and do not resume weight bearing on the sore joint until instructed. Persistent pain and inability to use the sore joint as directed for more than 2 to 3 days are warning signs indicating that you should see a caregiver for a follow-up visit as soon as possible. Initially, a hairline fracture (break in bone) may not be evident on X-rays. Persistent pain and swelling indicate that further evaluation, non-weight bearing or use of the joint (use of crutches or slings as instructed), or further X-rays are indicated. X-rays may sometimes not show a small fracture until a week or 10 days later. Make a follow-up appointment with your own caregiver or one to whom we have referred you. A radiologist (specialist in reading X-rays) may read your X-rays. Make sure you know how you are to obtain your X-ray results. Do not assume everything is normal if you do not hear  from [place]. SEEK MEDICAL CARE IF: Bruising, swelling, or pain increases. SEEK IMMEDIATE MEDICAL CARE IF:   Your fingers or toes are numb or blue.  The pain is not responding to medications and continues to stay the same or get worse.  The pain in your joint becomes severe.  You develop a fever over 102 F (38.9 C).  It becomes impossible to move or use the  joint. MAKE SURE YOU:   Understand these instructions.  Will watch your condition.  Will get help right away if you are not doing well or get worse. Document Released: 08/18/2005 Document Revised: 11/10/2011 Document Reviewed: 04/05/2008 [facility] Patient Information 2015 [place], [place]. This information is not intended to replace advice given to you by your health care provider. Make sure you discuss any questions you have with your health care provider.   Gout   Gout is an inflammatory arthritis caused by a buildup of uric acid crystals in the joints. Uric acid is a chemical that is normally present in the blood. When the level of uric acid in the blood is too high it can form crystals that deposit in your joints and tissues. This causes joint redness, soreness, and swelling (inflammation). Repeat attacks are common. Over time, uric acid crystals can form into masses (tophi) near a joint, destroying bone and causing disfigurement. Gout is treatable and often preventable.  CAUSES  The disease begins with elevated levels of uric acid in the blood. Uric acid is produced by your body when it breaks down a naturally found substance called purines. Certain foods you eat, such as meats and fish, contain high amounts of purines. Causes of an elevated uric acid level include:  Being passed down from parent to child (heredity).  Diseases that cause increased uric acid production (such as obesity, psoriasis, and certain cancers).  Excessive alcohol use.  Diet, especially diets rich in meat and seafood.  Medicines, including certain cancer-fighting medicines (chemotherapy), water pills (diuretics), and aspirin.  Chronic kidney disease. The kidneys are no longer able to remove uric acid well.  Problems with metabolism. Conditions strongly associated with gout include:  Obesity.  High blood pressure.  High cholesterol.  Diabetes. Not everyone with elevated uric acid levels gets gout. It is not  understood why some people get gout and others do not. Surgery, joint injury, and eating too much of certain foods are some of the factors that can lead to gout attacks.  SYMPTOMS  An attack of gout comes on quickly. It causes intense pain with redness, swelling, and warmth in a joint.  Fever can occur.  Often, only one joint is involved. Certain joints are more commonly involved:  Base of the big toe.  Knee.  Ankle.  Wrist.  Finger. Without treatment, an attack usually goes away in a few days to weeks. Between attacks, you usually will not have symptoms, which is different from many other forms of arthritis.  DIAGNOSIS  Your caregiver will suspect gout based on your symptoms and exam. In some cases, tests may be recommended. The tests may include:  Blood tests.  Urine tests.  X-rays.  Joint fluid exam. This exam requires a needle to remove fluid from the joint (arthrocentesis). Using a microscope, gout is confirmed when uric acid crystals are seen in the joint fluid. TREATMENT  There are two phases to gout treatment: treating the sudden onset (acute) attack and preventing attacks (prophylaxis).  Treatment of an Acute Attack.  Medicines are used. These include anti-inflammatory medicines  or steroid medicines.  An injection of steroid medicine into the affected joint is sometimes necessary.  The painful joint is rested. Movement can worsen the arthritis.  You may use warm or cold treatments on painful joints, depending which works best for you. Treatment to Prevent Attacks.  If you suffer from frequent gout attacks, your caregiver may advise preventive medicine. These medicines are started after the acute attack subsides. These medicines either help your kidneys eliminate uric acid from your body or decrease your uric acid production. You may need to stay on these medicines for a very long time.  The early phase of treatment with preventive medicine can be associated with an increase in  acute gout attacks. For this reason, during the first few months of treatment, your caregiver may also advise you to take medicines usually used for acute gout treatment. Be sure you understand your caregiver's directions. Your caregiver may make several adjustments to your medicine dose before these medicines are effective.  Discuss dietary treatment with your caregiver or dietitian. Alcohol and drinks high in sugar and fructose and foods such as meat, poultry, and seafood can increase uric acid levels. Your caregiver or dietitian can advise you on drinks and foods that should be limited. HOME CARE INSTRUCTIONS  Do not take aspirin to relieve pain. This raises uric acid levels.  Only take over-the-counter or prescription medicines for pain, discomfort, or fever as directed by your caregiver.  Rest the joint as much as possible. When in bed, keep sheets and blankets off painful areas.  Keep the affected joint raised (elevated).  Apply warm or cold treatments to painful joints. Use of warm or cold treatments depends on which works best for you.  Use crutches if the painful joint is in your leg.  Drink enough fluids to keep your urine clear or pale yellow. This helps your body get rid of uric acid. Limit alcohol, sugary drinks, and fructose drinks.  Follow your dietary instructions. Pay careful attention to the amount of protein you eat. Your daily diet should emphasize fruits, vegetables, whole grains, and fat-free or low-fat milk products. Discuss the use of coffee, vitamin C, and cherries with your caregiver or dietitian. These may be helpful in lowering uric acid levels.  Maintain a healthy body weight. SEEK MEDICAL CARE IF:  You develop diarrhea, vomiting, or any side effects from medicines.  You do not feel better in 24 hours, or you are getting worse. SEEK IMMEDIATE MEDICAL CARE IF:  Your joint becomes suddenly more tender, and you have chills or a fever. MAKE SURE YOU:  Understand these  instructions.  Will watch your condition.  Will get help right away if you are not doing well or get worse. Document Released: 08/15/2000 Document Revised: 01/02/2014 Document Reviewed: 03/31/2012  [facility] Patient Information 2015 [place], [place]. This information is not intended to replace advice given to you by your health care provider. Make sure you discuss any questions you have with your health care provider.

## 2015-05-26 NOTE — ED Provider Notes (Signed)
CSN: [id number]     Arrival date & time 05/26/15  [id number] History   First MD Initiated Contact with Patient 05/26/15 [phone]     Chief Complaint  Patient presents with  . Knee Pain    HPI   Jennifer Foster is a 69 y.o. female with a PMH of HTN, gout who presents to the ED with left knee pain x 1 week. She reports her pain is constant and exacerbated by movement. She states she has tried icy hot for symptom relief, which has not been effective. She denies recent injury. She reports decreased range of motion and pain with ambulating. She denies numbness, weakness, paresthesia.   Past Medical History  Diagnosis Date  . Hypertension   . Joint pain   . GERD (gastroesophageal reflux disease)   . Hyperlipemia   . History of asthma   . History of anemia   . Lipoma   . Asthma    Past Surgical History  Procedure Laterality Date  . Abdominal hysterectomy  1981    partial, per pt history  . Lipoma excision  01/28/11    neck  . Colonoscopy N/A 12/08/2013    Procedure: COLONOSCOPY;  Surgeon: [name], MD;  Location: [place];  Service: Endoscopy;  Laterality: N/A;  . Esophagogastroduodenoscopy N/A 12/08/2013    Procedure: ESOPHAGOGASTRODUODENOSCOPY (EGD);  Surgeon: [name], MD;  Location: [facility] ENDOSCOPY;  Service: Endoscopy;  Laterality: N/A;  . Givens capsule study N/A 12/08/2013    Procedure: GIVENS CAPSULE STUDY;  Surgeon: [name], MD;  Location: [place];  Service: Endoscopy;  Laterality: N/A;   Family History  Problem Relation Age of Onset  . Diabetes type II    . Kidney failure    . Kidney failure Son    Social History  Substance Use Topics  . Smoking status: Current Some Day Smoker -- 0.25 packs/day    Types: Cigarettes  . Smokeless tobacco: Never Used  . Alcohol Use: 0.0 oz/week    0 Standard drinks or equivalent per week     Comment: occasionally   OB History    No data available      Review of Systems  Constitutional: Negative for fever and chills.   Respiratory: Negative for shortness of breath.   Cardiovascular: Negative for chest pain.  Gastrointestinal: Negative for nausea, vomiting, abdominal pain, diarrhea and constipation.  Musculoskeletal: Positive for joint swelling and arthralgias.  Skin: Negative for color change.  Neurological: Negative for weakness and numbness.      Allergies  Ace inhibitors; Lisinopril; and Tramadol  Home Medications   Prior to Admission medications   Medication Sig Start Date End Date Taking? Authorizing Provider  amLODipine (NORVASC) 10 MG tablet Take 1 tablet (10 mg total) by mouth daily. 02/20/15  Yes [name], MD  aspirin EC 81 MG EC tablet Take 1 tablet (81 mg total) by mouth daily. 10/15/14  Yes Hilton Sinclair, MD  carvedilol (COREG) 12.5 MG tablet Take 1 tablet (12.5 mg total) by mouth 2 (two) times daily with a meal. 02/20/15  Yes [name], MD  pantoprazole (PROTONIX) 40 MG tablet Take 1 tablet (40 mg total) by mouth daily. Patient taking differently: Take 40 mg by mouth daily as needed (for acid reflux).  02/20/15  Yes [name], MD  PARoxetine (PAXIL) 20 MG tablet Take 1 tablet (20 mg total) by mouth daily. 02/23/15  Yes [name], MD  rosuvastatin (CRESTOR) 20 MG tablet  Take 1 tablet (20 mg total) by mouth at bedtime. 02/20/15  Yes [name], MD  triamcinolone cream (KENALOG) 0.1 % Apply 1 application topically 2 (two) times daily. Patient taking differently: Apply 1 application topically daily as needed (for eczema).  02/20/15  Yes [name], MD  albuterol (PROVENTIL HFA;VENTOLIN HFA) 108 (90 BASE) MCG/ACT inhaler Inhale 2 puffs into the lungs every 6 (six) hours as needed for wheezing or shortness of breath.    Historical Provider, MD  HYDROcodone-acetaminophen (NORCO/VICODIN) 5-325 MG per tablet Take 1 tablet by mouth every 4 (four) hours as needed. 05/26/15   Marella Chimes, PA-C  nicotine polacrilex (NICORETTE) 2 MG gum Take 1 each (2 mg  total) by mouth as needed for smoking cessation. Patient not taking: Reported on 12/12/2014 10/15/14   Hilton Sinclair, MD  polyethylene glycol [facility] / Floria Raveling) packet Take 17 g by mouth daily as needed. Patient not taking: Reported on 08/11/2014 12/27/13   [name], MD  predniSONE (DELTASONE) 20 MG tablet Take 2 tablets (40 mg total) by mouth daily. 05/26/15   Guadelupe Sabin Westfall, PA-C    BP 149/91 mmHg  Pulse 72  Temp(Src) 98 F (36.7 C) (Oral)  Resp 17  SpO2 96% Physical Exam  Constitutional: She is oriented to person, place, and time. She appears well-developed and well-nourished. No distress.  HENT:  Head: Normocephalic and atraumatic.  Right Ear: External ear normal.  Left Ear: External ear normal.  Nose: Nose normal.  Mouth/Throat: Oropharynx is clear and moist.  Eyes: Conjunctivae and EOM are normal. Pupils are equal, round, and reactive to light.  Neck: Normal range of motion. Neck supple.  Cardiovascular: Normal rate, regular rhythm and intact distal pulses.   Pulmonary/Chest: Effort normal and breath sounds normal. No respiratory distress.  Musculoskeletal: She exhibits edema and tenderness.       Left knee: She exhibits decreased range of motion, swelling and bony tenderness. She exhibits no deformity, no erythema, no LCL laxity and no MCL laxity.  Mild diffuse TTP of left knee with edema and warmth. No significant erythema. Decreased range of motion of left knee due to pain.   Neurological: She is alert and oriented to person, place, and time. She has normal strength. No sensory deficit.  Strength and sensation intact.  Skin: She is not diaphoretic.  Nursing note and vitals reviewed.   ED Course  Procedures (including critical care time)  Labs Review Labs Reviewed  CBC WITH DIFFERENTIAL/PLATELET  BASIC METABOLIC PANEL  URIC ACID    Imaging Review Dg Knee Complete 4 Views Left  05/26/2015   CLINICAL DATA:  Knee pain/stiffness, no injury  EXAM:  LEFT KNEE - COMPLETE 4+ VIEW  COMPARISON:  None.  FINDINGS: No fracture or dislocation is seen.  Mild degenerative changes, most prominent in the patellofemoral compartment. Suprapatellar enthesopathy.  No definite suprapatellar knee joint effusion.  IMPRESSION: No fracture or dislocation is seen.  Mild degenerative changes.   Electronically Signed   By: Julian Hy M.D.   On: 05/26/2015 08:31     I have personally reviewed and evaluated these images and lab results as part of my medical decision-making.   EKG Interpretation None      MDM   Final diagnoses:  Left knee pain   69 year old female presents with left knee pain x 1 week. Reports this feels similar to the symptoms she experiences with gout. Denies recent injury. Reports decreased range of motion and pain  with ambulating. Denies numbness, weakness, paresthesia.  Patient is afebrile. Vital signs stable. Diffuse TTP of left knee with mild edema and warmth. No significant erythema. Decreased range of motion of left knee due to pain. Patient refuses blood draw. Imaging of right knee demonstrates no fracture or dislocation, no definite suprapatellar knee joint effusion.  Low suspicion for septic joint. Patient's symptoms most likely consistent with gout. Has a history of elevated creatinine, and patient refused blood draw today. Will treat with prednisone, pain medication, and RICE therapy. Patient to follow-up with PCP. Return precautions discussed.  BP 149/91 mmHg  Pulse 72  Temp(Src) 98 F (36.7 C) (Oral)  Resp 17  SpO2 96%     Marella Chimes, PA-C 05/26/15 [place], MD 05/26/15 [id number]

## 2015-05-26 NOTE — ED Notes (Signed)
MD at bedside. 

## 2015-05-26 NOTE — ED Notes (Signed)
Pt left floor for Ct. Supplies in room

## 2015-05-26 NOTE — ED Notes (Signed)
Patient transported to X-ray 

## 2015-06-25 ENCOUNTER — Inpatient Hospital Stay (HOSPITAL_COMMUNITY)
Admission: EM | Admit: 2015-06-25 | Discharge: 2015-06-30 | DRG: 378 | Disposition: A | Discharge status: Home / Self Care | Payer: Commercial Managed Care - HMO | Attending: Family Medicine | Admitting: Family Medicine

## 2015-06-25 ENCOUNTER — Encounter (HOSPITAL_COMMUNITY): Payer: Self-pay | Admitting: *Deleted

## 2015-06-25 DIAGNOSIS — I129 Hypertensive chronic kidney disease with stage 1 through stage 4 chronic kidney disease, or unspecified chronic kidney disease: Secondary | ICD-10-CM | POA: Diagnosis not present

## 2015-06-25 DIAGNOSIS — K640 First degree hemorrhoids: Secondary | ICD-10-CM | POA: Diagnosis not present

## 2015-06-25 DIAGNOSIS — K2971 Gastritis, unspecified, with bleeding: Secondary | ICD-10-CM

## 2015-06-25 DIAGNOSIS — Z79899 Other long term (current) drug therapy: Secondary | ICD-10-CM | POA: Diagnosis not present

## 2015-06-25 DIAGNOSIS — F17200 Nicotine dependence, unspecified, uncomplicated: Secondary | ICD-10-CM

## 2015-06-25 DIAGNOSIS — Z9114 Patient's other noncompliance with medication regimen: Secondary | ICD-10-CM | POA: Diagnosis not present

## 2015-06-25 DIAGNOSIS — K922 Gastrointestinal hemorrhage, unspecified: Secondary | ICD-10-CM | POA: Diagnosis present

## 2015-06-25 DIAGNOSIS — K625 Hemorrhage of anus and rectum: Secondary | ICD-10-CM

## 2015-06-25 DIAGNOSIS — I10 Essential (primary) hypertension: Secondary | ICD-10-CM | POA: Diagnosis not present

## 2015-06-25 DIAGNOSIS — K5791 Diverticulosis of intestine, part unspecified, without perforation or abscess with bleeding: Principal | ICD-10-CM | POA: Diagnosis present

## 2015-06-25 DIAGNOSIS — F411 Generalized anxiety disorder: Secondary | ICD-10-CM | POA: Diagnosis present

## 2015-06-25 DIAGNOSIS — R42 Dizziness and giddiness: Secondary | ICD-10-CM | POA: Diagnosis not present

## 2015-06-25 DIAGNOSIS — R195 Other fecal abnormalities: Secondary | ICD-10-CM | POA: Insufficient documentation

## 2015-06-25 DIAGNOSIS — K5790 Diverticulosis of intestine, part unspecified, without perforation or abscess without bleeding: Secondary | ICD-10-CM | POA: Diagnosis not present

## 2015-06-25 DIAGNOSIS — N179 Acute kidney failure, unspecified: Secondary | ICD-10-CM | POA: Diagnosis not present

## 2015-06-25 DIAGNOSIS — E785 Hyperlipidemia, unspecified: Secondary | ICD-10-CM | POA: Diagnosis present

## 2015-06-25 DIAGNOSIS — Z79891 Long term (current) use of opiate analgesic: Secondary | ICD-10-CM | POA: Diagnosis not present

## 2015-06-25 DIAGNOSIS — K219 Gastro-esophageal reflux disease without esophagitis: Secondary | ICD-10-CM | POA: Diagnosis present

## 2015-06-25 DIAGNOSIS — K921 Melena: Secondary | ICD-10-CM | POA: Diagnosis not present

## 2015-06-25 DIAGNOSIS — Z888 Allergy status to other drugs, medicaments and biological substances status: Secondary | ICD-10-CM | POA: Diagnosis not present

## 2015-06-25 DIAGNOSIS — N183 Chronic kidney disease, stage 3 (moderate): Secondary | ICD-10-CM | POA: Diagnosis not present

## 2015-06-25 DIAGNOSIS — F329 Major depressive disorder, single episode, unspecified: Secondary | ICD-10-CM | POA: Diagnosis not present

## 2015-06-25 DIAGNOSIS — Z7982 Long term (current) use of aspirin: Secondary | ICD-10-CM

## 2015-06-25 DIAGNOSIS — F17210 Nicotine dependence, cigarettes, uncomplicated: Secondary | ICD-10-CM | POA: Diagnosis present

## 2015-06-25 DIAGNOSIS — I9589 Other hypotension: Secondary | ICD-10-CM | POA: Diagnosis not present

## 2015-06-25 DIAGNOSIS — N184 Chronic kidney disease, stage 4 (severe): Secondary | ICD-10-CM | POA: Diagnosis present

## 2015-06-25 DIAGNOSIS — F1410 Cocaine abuse, uncomplicated: Secondary | ICD-10-CM | POA: Diagnosis present

## 2015-06-25 DIAGNOSIS — J45909 Unspecified asthma, uncomplicated: Secondary | ICD-10-CM | POA: Diagnosis present

## 2015-06-25 DIAGNOSIS — F32A Depression, unspecified: Secondary | ICD-10-CM | POA: Diagnosis present

## 2015-06-25 DIAGNOSIS — D62 Acute posthemorrhagic anemia: Secondary | ICD-10-CM | POA: Diagnosis not present

## 2015-06-25 DIAGNOSIS — I959 Hypotension, unspecified: Secondary | ICD-10-CM | POA: Insufficient documentation

## 2015-06-25 DIAGNOSIS — M109 Gout, unspecified: Secondary | ICD-10-CM | POA: Diagnosis present

## 2015-06-25 DIAGNOSIS — D500 Iron deficiency anemia secondary to blood loss (chronic): Secondary | ICD-10-CM | POA: Diagnosis not present

## 2015-06-25 DIAGNOSIS — Z720 Tobacco use: Secondary | ICD-10-CM | POA: Diagnosis present

## 2015-06-25 HISTORY — DX: Acute posthemorrhagic anemia: D62

## 2015-06-25 HISTORY — DX: Chronic kidney disease, stage 3 (moderate): N18.3

## 2015-06-25 LAB — URINE MICROSCOPIC-ADD ON

## 2015-06-25 LAB — COMPREHENSIVE METABOLIC PANEL
ALBUMIN: 2.6 g/dL — AB (ref 3.5–5.0)
ALT: 10 U/L — ABNORMAL LOW (ref 14–54)
ANION GAP: 6 (ref 5–15)
AST: 15 U/L (ref 15–41)
Alkaline Phosphatase: 53 U/L (ref 38–126)
BUN: 20 mg/dL (ref 6–20)
CHLORIDE: 111 mmol/L (ref 101–111)
CO2: 23 mmol/L (ref 22–32)
Calcium: 8.4 mg/dL — ABNORMAL LOW (ref 8.9–10.3)
Creatinine, Ser: 1.68 mg/dL — ABNORMAL HIGH (ref 0.44–1.00)
GFR calc Af Amer: 35 mL/min — ABNORMAL LOW (ref >60)
GFR calc non Af Amer: 30 mL/min — ABNORMAL LOW (ref >60)
GLUCOSE: 144 mg/dL — AB (ref 65–99)
POTASSIUM: 3.5 mmol/L (ref 3.5–5.1)
Sodium: 140 mmol/L (ref 135–145)
TOTAL PROTEIN: 6 g/dL — AB (ref 6.5–8.1)
Total Bilirubin: 0.6 mg/dL (ref 0.3–1.2)

## 2015-06-25 LAB — URINALYSIS, ROUTINE W REFLEX MICROSCOPIC
Bilirubin Urine: NEGATIVE
Glucose, UA: NEGATIVE mg/dL
Ketones, ur: NEGATIVE mg/dL
Leukocytes, UA: NEGATIVE
Nitrite: NEGATIVE
Protein, ur: 100 mg/dL — AB
SPECIFIC GRAVITY, URINE: 1.015 (ref 1.005–1.030)
UROBILINOGEN UA: 0.2 mg/dL (ref 0.0–1.0)
pH: 5.5 (ref 5.0–8.0)

## 2015-06-25 LAB — BASIC METABOLIC PANEL
Anion gap: 7 (ref 5–15)
BUN: 18 mg/dL (ref 6–20)
CALCIUM: 8.2 mg/dL — AB (ref 8.9–10.3)
CHLORIDE: 113 mmol/L — AB (ref 101–111)
CO2: 25 mmol/L (ref 22–32)
CREATININE: 1.59 mg/dL — AB (ref 0.44–1.00)
GFR calc Af Amer: 38 mL/min — ABNORMAL LOW (ref >60)
GFR, EST NON AFRICAN AMERICAN: 33 mL/min — AB (ref >60)
Glucose, Bld: 101 mg/dL — ABNORMAL HIGH (ref 65–99)
Potassium: 3.1 mmol/L — ABNORMAL LOW (ref 3.5–5.1)
SODIUM: 145 mmol/L (ref 135–145)

## 2015-06-25 LAB — CBC
HCT: 21.4 % — ABNORMAL LOW (ref 36.0–46.0)
HCT: 22 % — ABNORMAL LOW (ref 36.0–46.0)
HCT: 25.7 % — ABNORMAL LOW (ref 36.0–46.0)
HEMATOCRIT: 28.4 % — AB (ref 36.0–46.0)
HEMOGLOBIN: 9.4 g/dL — AB (ref 12.0–15.0)
HEMOGLOBIN: 8.7 g/dL — AB (ref 12.0–15.0)
Hemoglobin: 7.2 g/dL — ABNORMAL LOW (ref 12.0–15.0)
Hemoglobin: 7.4 g/dL — ABNORMAL LOW (ref 12.0–15.0)
MCH: 26.5 pg (ref 26.0–34.0)
MCH: 27.1 pg (ref 26.0–34.0)
MCH: 27.6 pg (ref 26.0–34.0)
MCH: 27.5 pg (ref 26.0–34.0)
MCHC: 33.1 g/dL (ref 30.0–36.0)
MCHC: 33.6 g/dL (ref 30.0–36.0)
MCHC: 33.6 g/dL (ref 30.0–36.0)
MCHC: 33.9 g/dL (ref 30.0–36.0)
MCV: 80 fL (ref 78.0–100.0)
MCV: 80.5 fL (ref 78.0–100.0)
MCV: 81.3 fL (ref 78.0–100.0)
MCV: 82.1 fL (ref 78.0–100.0)
PLATELETS: 165 10*3/uL (ref 150–400)
PLATELETS: 142 10*3/uL — AB (ref 150–400)
Platelets: 155 10*3/uL (ref 150–400)
Platelets: 238 10*3/uL (ref 150–400)
RBC: 2.66 MIL/uL — ABNORMAL LOW (ref 3.87–5.11)
RBC: 2.68 MIL/uL — AB (ref 3.87–5.11)
RBC: 3.16 MIL/uL — ABNORMAL LOW (ref 3.87–5.11)
RBC: 3.55 MIL/uL — ABNORMAL LOW (ref 3.87–5.11)
RDW: 16.1 % — ABNORMAL HIGH (ref 11.5–15.5)
RDW: 16.3 % — ABNORMAL HIGH (ref 11.5–15.5)
RDW: 16.6 % — AB (ref 11.5–15.5)
RDW: 17 % — AB (ref 11.5–15.5)
WBC: 5.3 10*3/uL (ref 4.0–10.5)
WBC: 6.2 10*3/uL (ref 4.0–10.5)
WBC: 6.5 10*3/uL (ref 4.0–10.5)
WBC: 8.1 10*3/uL (ref 4.0–10.5)

## 2015-06-25 LAB — DIFFERENTIAL
BASOS ABS: 0 10*3/uL (ref 0.0–0.1)
BASOS PCT: 0 %
Basophils Absolute: 0 10*3/uL (ref 0.0–0.1)
Basophils Relative: 0 %
EOS ABS: 0.2 10*3/uL (ref 0.0–0.7)
Eosinophils Absolute: 0.1 10*3/uL (ref 0.0–0.7)
Eosinophils Relative: 2 %
Eosinophils Relative: 1 %
LYMPHS ABS: 1.4 10*3/uL (ref 0.7–4.0)
Lymphocytes Relative: 18 %
Lymphocytes Relative: 20 %
Lymphs Abs: 1.2 10*3/uL (ref 0.7–4.0)
Monocytes Absolute: 0.6 10*3/uL (ref 0.1–1.0)
Monocytes Absolute: 0.6 10*3/uL (ref 0.1–1.0)
Monocytes Relative: 8 %
Monocytes Relative: 10 %
NEUTROS ABS: 5.7 10*3/uL (ref 1.7–7.7)
NEUTROS PCT: 72 %
Neutro Abs: 4.3 10*3/uL (ref 1.7–7.7)
Neutrophils Relative %: 69 %

## 2015-06-25 LAB — RAPID URINE DRUG SCREEN, HOSP PERFORMED
Amphetamines: NOT DETECTED
Barbiturates: NOT DETECTED
Benzodiazepines: NOT DETECTED
Cocaine: POSITIVE — AB
Opiates: NOT DETECTED
Tetrahydrocannabinol: NOT DETECTED

## 2015-06-25 LAB — LIPASE, BLOOD: LIPASE: 34 U/L (ref 11–51)

## 2015-06-25 LAB — SAMPLE TO BLOOD BANK

## 2015-06-25 LAB — MRSA PCR SCREENING: MRSA by PCR: NEGATIVE

## 2015-06-25 LAB — OCCULT BLOOD, POC DEVICE: FECAL OCCULT BLD: POSITIVE — AB

## 2015-06-25 LAB — I-STAT CG4 LACTIC ACID, ED: Lactic Acid, Venous: 1.29 mmol/L (ref 0.5–2.0)

## 2015-06-25 LAB — PREPARE RBC (CROSSMATCH)

## 2015-06-25 MED ORDER — PANTOPRAZOLE SODIUM 40 MG IV SOLR
40.0000 mg | Freq: Two times a day (BID) | INTRAVENOUS | Status: DC
  Administered 2015-06-25 – 2015-06-26 (×4): 40 mg via INTRAVENOUS
  Filled 2015-06-25 (×3): qty 40

## 2015-06-25 MED ORDER — CARVEDILOL 12.5 MG PO TABS: 12.5000 mg | ORAL_TABLET | Freq: Two times a day (BID) | ORAL | Status: DC

## 2015-06-25 MED ORDER — DIPHENHYDRAMINE HCL 25 MG PO CAPS
25.0000 mg | ORAL_CAPSULE | Freq: Once | ORAL | Status: AC
  Administered 2015-06-25: 25 mg via ORAL
  Filled 2015-06-25: qty 1

## 2015-06-25 MED ORDER — AMLODIPINE BESYLATE 10 MG PO TABS
10.0000 mg | ORAL_TABLET | Freq: Every day | ORAL | Status: DC
  Administered 2015-06-25 – 2015-06-30 (×6): 10 mg via ORAL
  Filled 2015-06-25 (×6): qty 1

## 2015-06-25 MED ORDER — ROSUVASTATIN CALCIUM 20 MG PO TABS
20.0000 mg | ORAL_TABLET | Freq: Every day | ORAL | Status: DC
  Administered 2015-06-25 – 2015-06-29 (×5): 20 mg via ORAL
  Filled 2015-06-25 (×5): qty 1

## 2015-06-25 MED ORDER — SODIUM CHLORIDE 0.9 % IV SOLN
INTRAVENOUS | Status: DC
  Administered 2015-06-25: 1000 mL via INTRAVENOUS
  Administered 2015-06-26 – 2015-06-27 (×2): via INTRAVENOUS

## 2015-06-25 MED ORDER — SODIUM CHLORIDE 0.9 % IV SOLN: INTRAVENOUS | Status: AC

## 2015-06-25 MED ORDER — ACETAMINOPHEN 325 MG PO TABS
650.0000 mg | ORAL_TABLET | Freq: Once | ORAL | Status: AC
  Administered 2015-06-25: 650 mg via ORAL
  Filled 2015-06-25: qty 2

## 2015-06-25 MED ORDER — CARVEDILOL 12.5 MG PO TABS
12.5000 mg | ORAL_TABLET | Freq: Two times a day (BID) | ORAL | Status: DC
  Administered 2015-06-25 – 2015-06-28 (×6): 12.5 mg via ORAL
  Filled 2015-06-25 (×8): qty 1

## 2015-06-25 MED ORDER — AMLODIPINE BESYLATE 10 MG PO TABS: 10.0000 mg | ORAL_TABLET | Freq: Every day | ORAL | Status: DC

## 2015-06-25 MED ORDER — SODIUM CHLORIDE 0.9 % IV SOLN
Freq: Once | INTRAVENOUS | Status: AC
  Administered 2015-06-25: 13:00:00 via INTRAVENOUS

## 2015-06-25 NOTE — ED Notes (Signed)
Hemoccult POS

## 2015-06-25 NOTE — ED Notes (Signed)
Pt c/o NV and rectal bleeding worsening tonight; Reports similar episode in the past in which was told "I had some bad drugs." Pt smoked "reefer" Friday night and has been feeling weak since then. C/o rectal bleeding, which was noted in bedpan.

## 2015-06-25 NOTE — ED Notes (Signed)
Pt stated unable to urinate at the moment, only bowel movement needed. Pt ambulated to bathroom.

## 2015-06-25 NOTE — Consult Note (Signed)
Reason for Consult: GI bleeding Referring Physician: Hospital team  Jennifer Foster is an 69 y.o. female.  HPI: Patient seen and examined and her case discussed with her and her husband and her hospital computer chart was reviewed and her admission last year and her procedures by a different gastroenterologist were reviewed and she has not had any bleeding since then and her bleeding was mostly red on Sunday and only one time this morning and no black bowel movements or pain and no upper tract symptoms and her family history is negative for any GI issues and she was on an aspirin a day and some gout medicine but no other blood thinners and no other complaints  Past Medical History  Diagnosis Date  . Hypertension   . Joint pain   . GERD (gastroesophageal reflux disease)   . Hyperlipemia   . History of asthma   . History of anemia   . Lipoma   . Asthma     Past Surgical History  Procedure Laterality Date  . Abdominal hysterectomy  1981    partial, per pt history  . Lipoma excision  01/28/11    neck  . Colonoscopy N/A 12/08/2013    Procedure: COLONOSCOPY;  Surgeon: [name], MD;  Location: [place];  Service: Endoscopy;  Laterality: N/A;  . Esophagogastroduodenoscopy N/A 12/08/2013    Procedure: ESOPHAGOGASTRODUODENOSCOPY (EGD);  Surgeon: [name], MD;  Location: [facility] ENDOSCOPY;  Service: Endoscopy;  Laterality: N/A;  . Givens capsule study N/A 12/08/2013    Procedure: GIVENS CAPSULE STUDY;  Surgeon: [name], MD;  Location: [place];  Service: Endoscopy;  Laterality: N/A;    Family History  Problem Relation Age of Onset  . Diabetes type II    . Kidney failure    . Kidney failure Son     Social History:  reports that she has been smoking Cigarettes.  She has been smoking about 0.25 packs per day. She has never used smokeless tobacco. She reports that she drinks alcohol. She reports that she does not use illicit drugs.  Allergies:  Allergies  Allergen Reactions   . Ace Inhibitors Swelling  . Lisinopril Swelling    Swollen tongue   . Tramadol Itching    Medications: I have reviewed the patient's current medications.  Results for orders placed or performed during the hospital encounter of 06/25/15 (from the past 48 hour(s))  Sample to Blood Bank     Status: None   Collection Time: 06/25/15  1:21 AM  Result Value Ref Range   Blood Bank Specimen SAMPLE AVAILABLE FOR TESTING    Sample Expiration 06/26/2015   Type and screen     Status: None (Preliminary result)   Collection Time: 06/25/15  1:21 AM  Result Value Ref Range   ABO/RH(D) A POS    Antibody Screen NEG    Sample Expiration 06/28/2015    Unit Number [id number]    Blood Component Type RED CELLS,LR    Unit division 00    Status of Unit ISSUED    Transfusion Status OK TO TRANSFUSE    Crossmatch Result Compatible   Differential     Status: None   Collection Time: 06/25/15  1:29 AM  Result Value Ref Range   Neutrophils Relative % 72 %   Neutro Abs 5.7 1.7 - 7.7 K/uL   Lymphocytes Relative 18 %   Lymphs Abs 1.4 0.7 - 4.0 K/uL   Monocytes Relative 8 %   Monocytes Absolute 0.6  0.1 - 1.0 K/uL   Eosinophils Relative 2 %   Eosinophils Absolute 0.2 0.0 - 0.7 K/uL   Basophils Relative 0 %   Basophils Absolute 0.0 0.0 - 0.1 K/uL  Lipase, blood     Status: None   Collection Time: 06/25/15  1:43 AM  Result Value Ref Range   Lipase 34 11 - 51 U/L    Comment: Please note change in reference range.  Comprehensive metabolic panel     Status: Abnormal   Collection Time: 06/25/15  1:43 AM  Result Value Ref Range   Sodium 140 135 - 145 mmol/L   Potassium 3.5 3.5 - 5.1 mmol/L   Chloride 111 101 - 111 mmol/L   CO2 23 22 - 32 mmol/L   Glucose, Bld 144 (H) 65 - 99 mg/dL   BUN 20 6 - 20 mg/dL   Creatinine, Ser 1.68 (H) 0.44 - 1.00 mg/dL   Calcium 8.4 (L) 8.9 - 10.3 mg/dL   Total Protein 6.0 (L) 6.5 - 8.1 g/dL   Albumin 2.6 (L) 3.5 - 5.0 g/dL   AST 15 15 - 41 U/L   ALT 10 (L) 14 - 54 U/L    Alkaline Phosphatase 53 38 - 126 U/L   Total Bilirubin 0.6 0.3 - 1.2 mg/dL   GFR calc non Af Amer 30 (L) >60 mL/min   GFR calc Af Amer 35 (L) >60 mL/min    Comment: (NOTE) The eGFR has been calculated using the CKD EPI equation. This calculation has not been validated in all clinical situations. eGFR's persistently <60 mL/min signify possible Chronic Kidney Disease.    Anion gap 6 5 - 15  CBC     Status: Abnormal   Collection Time: 06/25/15  1:43 AM  Result Value Ref Range   WBC 8.1 4.0 - 10.5 K/uL   RBC 3.55 (L) 3.87 - 5.11 MIL/uL   Hemoglobin 9.4 (L) 12.0 - 15.0 g/dL   HCT 28.4 (L) 36.0 - 46.0 %   MCV 80.0 78.0 - 100.0 fL   MCH 26.5 26.0 - 34.0 pg   MCHC 33.1 30.0 - 36.0 g/dL   RDW 16.1 (H) 11.5 - 15.5 %   Platelets 238 150 - 400 K/uL  Occult blood, poc device     Status: Abnormal   Collection Time: 06/25/15  2:06 AM  Result Value Ref Range   Fecal Occult Bld POSITIVE (A) NEGATIVE  I-Stat CG4 Lactic Acid, ED     Status: None   Collection Time: 06/25/15  2:39 AM  Result Value Ref Range   Lactic Acid, Venous 1.29 0.5 - 2.0 mmol/L  MRSA PCR Screening     Status: None   Collection Time: 06/25/15  5:20 AM  Result Value Ref Range   MRSA by PCR NEGATIVE NEGATIVE    Comment:        The GeneXpert MRSA Assay (FDA approved for NASAL specimens only), is one component of a comprehensive MRSA colonization surveillance program. It is not intended to diagnose MRSA infection nor to guide or monitor treatment for MRSA infections.   Urinalysis, Routine w reflex microscopic (not at [facility])     Status: Abnormal   Collection Time: 06/25/15  9:20 AM  Result Value Ref Range   Color, Urine YELLOW YELLOW   APPearance CLEAR CLEAR   Specific Gravity, Urine 1.015 1.005 - 1.030   pH 5.5 5.0 - 8.0   Glucose, UA NEGATIVE NEGATIVE mg/dL   Hgb urine dipstick MODERATE (A) NEGATIVE  Bilirubin Urine NEGATIVE NEGATIVE   Ketones, ur NEGATIVE NEGATIVE mg/dL   Protein, ur 100 (A) NEGATIVE mg/dL    Urobilinogen, UA 0.2 0.0 - 1.0 mg/dL   Nitrite NEGATIVE NEGATIVE   Leukocytes, UA NEGATIVE NEGATIVE  Urine rapid drug screen (hosp performed)     Status: Abnormal   Collection Time: 06/25/15  9:20 AM  Result Value Ref Range   Opiates NONE DETECTED NONE DETECTED   Cocaine POSITIVE (A) NONE DETECTED   Benzodiazepines NONE DETECTED NONE DETECTED   Amphetamines NONE DETECTED NONE DETECTED   Tetrahydrocannabinol NONE DETECTED NONE DETECTED   Barbiturates NONE DETECTED NONE DETECTED    Comment:        DRUG SCREEN FOR MEDICAL PURPOSES ONLY.  IF CONFIRMATION IS NEEDED FOR ANY PURPOSE, NOTIFY LAB WITHIN 5 DAYS.        LOWEST DETECTABLE LIMITS FOR URINE DRUG SCREEN Drug Class       Cutoff (ng/mL) Amphetamine      1000 Barbiturate      200 Benzodiazepine   599 Tricyclics       357 Opiates          300 Cocaine          300 THC              50   Urine microscopic-add on     Status: Abnormal   Collection Time: 06/25/15  9:20 AM  Result Value Ref Range   Squamous Epithelial / LPF RARE RARE   WBC, UA 0-2 <3 WBC/hpf   RBC / HPF 0-2 <3 RBC/hpf   Casts HYALINE CASTS (A) NEGATIVE    Comment: GRANULAR CAST  Basic metabolic panel     Status: Abnormal   Collection Time: 06/25/15 10:24 AM  Result Value Ref Range   Sodium 145 135 - 145 mmol/L   Potassium 3.1 (L) 3.5 - 5.1 mmol/L   Chloride 113 (H) 101 - 111 mmol/L   CO2 25 22 - 32 mmol/L   Glucose, Bld 101 (H) 65 - 99 mg/dL   BUN 18 6 - 20 mg/dL   Creatinine, Ser 1.59 (H) 0.44 - 1.00 mg/dL   Calcium 8.2 (L) 8.9 - 10.3 mg/dL   GFR calc non Af Amer 33 (L) >60 mL/min   GFR calc Af Amer 38 (L) >60 mL/min    Comment: (NOTE) The eGFR has been calculated using the CKD EPI equation. This calculation has not been validated in all clinical situations. eGFR's persistently <60 mL/min signify possible Chronic Kidney Disease.    Anion gap 7 5 - 15  CBC     Status: Abnormal   Collection Time: 06/25/15 10:24 AM  Result Value Ref Range   WBC 6.2  4.0 - 10.5 K/uL   RBC 2.66 (L) 3.87 - 5.11 MIL/uL   Hemoglobin 7.2 (L) 12.0 - 15.0 g/dL    Comment: REPEATED TO VERIFY DELTA CHECK NOTED    HCT 21.4 (L) 36.0 - 46.0 %   MCV 80.5 78.0 - 100.0 fL   MCH 27.1 26.0 - 34.0 pg   MCHC 33.6 30.0 - 36.0 g/dL   RDW 16.6 (H) 11.5 - 15.5 %   Platelets 165 150 - 400 K/uL  Differential     Status: None   Collection Time: 06/25/15 10:24 AM  Result Value Ref Range   Neutrophils Relative % 69 %   Neutro Abs 4.3 1.7 - 7.7 K/uL   Lymphocytes Relative 20 %   Lymphs Abs 1.2 0.7 - 4.0  K/uL   Monocytes Relative 10 %   Monocytes Absolute 0.6 0.1 - 1.0 K/uL   Eosinophils Relative 1 %   Eosinophils Absolute 0.1 0.0 - 0.7 K/uL   Basophils Relative 0 %   Basophils Absolute 0.0 0.0 - 0.1 K/uL  Prepare RBC     Status: None   Collection Time: 06/25/15 12:37 PM  Result Value Ref Range   Order Confirmation ORDER PROCESSED BY BLOOD BANK     No results found.  ROS negative except above she has not had a transfusion for bleeding before Blood pressure 165/65, pulse 65, temperature 98.5 F (36.9 C), temperature source Oral, resp. rate 17, height 5' 4" (1.626 m), weight 77.656 kg (171 lb 3.2 oz), SpO2 99 %. Physical Exam vital signs stable afebrile no acute distress lungs are clear heart regular rate and rhythm abdomen is soft nontender labs reviewed  Assessment/Plan: GI bleeding probably diverticular seemingly stopped at the current time Plan: Okay to have clear liquids if bleeding increases consider nuclear bleeding scan otherwise if no further bleeding by tomorrow can advance diet and hopefully go home soon and reevaluate aspirin needs and make sure she is not on nonsteroidals at home and happy to see back when necessary and we will check on tomorrow  [facility] E 06/25/2015, 2:05 PM

## 2015-06-25 NOTE — Progress Notes (Signed)
[facility] PCP's NOTE Jennifer Hubbert,MD I stopped by to visit [name] in the hospital this morning. She was admitted yesterday after she had an episode of bleeding per rectum during bowel movement. She denies any abdominal pain, felt dizzy yesterday but not today. She stated she is feeling better now. I appreciate FMTS care provided. Patient is anticipating Gastroenterologist's evaluation this morning. We talked briefly about her medication for depression ( Paxil) which had not been compliant with, she stated it makes her anxious whenever she takes it. I have tried many times to encourage compliance and I doubt she will stay on this medication. Her problem is more of anxiety than depression. I can always readdress this issue when I see her back in the clinic. Note I have not put her on Norco for Gout and it is not a medication I will want her to be discharged home on. Thank you once again for caring for my patient.

## 2015-06-25 NOTE — ED Provider Notes (Signed)
CSN: [id number]   Arrival date & time 06/25/15 0101  History  By signing my name below, I, Altamease Oiler, attest that this documentation has been prepared under the direction and in the presence of Delora Fuel, MD. Electronically Signed: Altamease Oiler, ED Scribe. 06/25/2015. 3:16 AM.  Chief Complaint  Patient presents with  . Rectal Bleeding    HPI The history is provided by the patient. No language interpreter was used.   Jennifer Foster is a 69 y.o. female with history of GI bleeding who presents to the Emergency Department complaining of hematochezia with onset last night (within the last 8 hours). She has had multiple episodes of dark red stools. She had a several similar episodes in the past with the most recent 1.5 years ago. Associated symptoms include light-headedness and nausea. Pt denies abdominal pain. She did not f/u with Family Practice after her most recent ED visit.   Past Medical History  Diagnosis Date  . Hypertension   . Joint pain   . GERD (gastroesophageal reflux disease)   . Hyperlipemia   . History of asthma   . History of anemia   . Lipoma   . Asthma     Past Surgical History  Procedure Laterality Date  . Abdominal hysterectomy  1981    partial, per pt history  . Lipoma excision  01/28/11    neck  . Colonoscopy N/A 12/08/2013    Procedure: COLONOSCOPY;  Surgeon: [name], MD;  Location: [place];  Service: Endoscopy;  Laterality: N/A;  . Esophagogastroduodenoscopy N/A 12/08/2013    Procedure: ESOPHAGOGASTRODUODENOSCOPY (EGD);  Surgeon: [name], MD;  Location: [facility] ENDOSCOPY;  Service: Endoscopy;  Laterality: N/A;  . Givens capsule study N/A 12/08/2013    Procedure: GIVENS CAPSULE STUDY;  Surgeon: [name], MD;  Location: [place];  Service: Endoscopy;  Laterality: N/A;    Family History  Problem Relation Age of Onset  . Diabetes type II    . Kidney failure    . Kidney failure Son     Social History  Substance Use Topics  .  Smoking status: Current Some Day Smoker -- 0.25 packs/day    Types: Cigarettes  . Smokeless tobacco: Never Used  . Alcohol Use: 0.0 oz/week    0 Standard drinks or equivalent per week     Comment: occasionally     Review of Systems  Gastrointestinal: Positive for nausea, blood in stool and hematochezia. Negative for vomiting and abdominal pain.  Neurological: Positive for light-headedness.  All other systems reviewed and are negative.  Home Medications   Prior to Admission medications   Medication Sig Start Date End Date Taking? Authorizing Provider  albuterol (PROVENTIL HFA;VENTOLIN HFA) 108 (90 BASE) MCG/ACT inhaler Inhale 2 puffs into the lungs every 6 (six) hours as needed for wheezing or shortness of breath.    Historical Provider, MD  amLODipine (NORVASC) 10 MG tablet Take 1 tablet (10 mg total) by mouth daily. 02/20/15   [name], MD  aspirin EC 81 MG EC tablet Take 1 tablet (81 mg total) by mouth daily. 10/15/14   Hilton Sinclair, MD  carvedilol (COREG) 12.5 MG tablet Take 1 tablet (12.5 mg total) by mouth 2 (two) times daily with a meal. 02/20/15   [name], MD  HYDROcodone-acetaminophen (NORCO/VICODIN) 5-325 MG per tablet Take 1 tablet by mouth every 4 (four) hours as needed. 05/26/15   Marella Chimes, PA-C  nicotine polacrilex (NICORETTE) 2 MG gum Take  1 each (2 mg total) by mouth as needed for smoking cessation. Patient not taking: Reported on 12/12/2014 10/15/14   Hilton Sinclair, MD  pantoprazole (PROTONIX) 40 MG tablet Take 1 tablet (40 mg total) by mouth daily. Patient taking differently: Take 40 mg by mouth daily as needed (for acid reflux).  02/20/15   [name], MD  PARoxetine (PAXIL) 20 MG tablet Take 1 tablet (20 mg total) by mouth daily. 02/23/15   [name], MD  polyethylene glycol (MIRALAX / GLYCOLAX) packet Take 17 g by mouth daily as needed. Patient not taking: Reported on 08/11/2014 12/27/13   [name], MD   predniSONE (DELTASONE) 20 MG tablet Take 2 tablets (40 mg total) by mouth daily. 05/26/15   Marella Chimes, PA-C  rosuvastatin (CRESTOR) 20 MG tablet Take 1 tablet (20 mg total) by mouth at bedtime. 02/20/15   [name], MD  triamcinolone cream (KENALOG) 0.1 % Apply 1 application topically 2 (two) times daily. Patient taking differently: Apply 1 application topically daily as needed (for eczema).  02/20/15   [name], MD    Allergies  Ace inhibitors; Lisinopril; and Tramadol  Triage Vitals: BP 123/65 mmHg  Pulse 96  Temp(Src) 98.5 F (36.9 C)  Resp 18  Ht 5\' 4"  (1.626 m)  SpO2 98%  Physical Exam  Constitutional: She is oriented to person, place, and time. She appears well-developed and well-nourished. No distress.  HENT:  Head: Normocephalic and atraumatic.  Eyes: EOM are normal. Pupils are equal, round, and reactive to light.  Conjunctival pallor  Neck: Normal range of motion. Neck supple. No JVD present.  Cardiovascular: Normal rate, regular rhythm and normal heart sounds.   No murmur heard. Pulmonary/Chest: Effort normal and breath sounds normal. She has no wheezes. She has no rales. She exhibits no tenderness.  Abdominal: Soft. Bowel sounds are normal. She exhibits no distension and no mass. There is no tenderness.  Musculoskeletal: Normal range of motion. She exhibits no edema.  Lymphadenopathy:    She has no cervical adenopathy.  Neurological: She is alert and oriented to person, place, and time. No cranial nerve deficit. She exhibits normal muscle tone. Coordination normal.  Skin: Skin is warm and dry. No rash noted.  Psychiatric: She has a normal mood and affect. Her behavior is normal. Judgment and thought content normal.  Nursing note and vitals reviewed.   ED Course  Procedures   DIAGNOSTIC STUDIES: Oxygen Saturation is 98% on RA, normal by my interpretation.    COORDINATION OF CARE: 2:19 AM Discussed treatment plan which includes lab work  with pt at bedside and pt agreed to plan.  3:14 AM-Consult complete with Dr. Cyndia Skeeters [facility]). Patient case explained and discussed. Agrees to admit patient for further evaluation and treatment. Call ended at 3:16 AM   Results for orders placed or performed during the hospital encounter of 06/25/15  MRSA PCR Screening  Result Value Ref Range   MRSA by PCR NEGATIVE NEGATIVE  Lipase, blood  Result Value Ref Range   Lipase 34 11 - 51 U/L  Comprehensive metabolic panel  Result Value Ref Range   Sodium 140 135 - 145 mmol/L   Potassium 3.5 3.5 - 5.1 mmol/L   Chloride 111 101 - 111 mmol/L   CO2 23 22 - 32 mmol/L   Glucose, Bld 144 (H) 65 - 99 mg/dL   BUN 20 6 - 20 mg/dL   Creatinine, Ser 1.68 (H) 0.44 - 1.00 mg/dL  Calcium 8.4 (L) 8.9 - 10.3 mg/dL   Total Protein 6.0 (L) 6.5 - 8.1 g/dL   Albumin 2.6 (L) 3.5 - 5.0 g/dL   AST 15 15 - 41 U/L   ALT 10 (L) 14 - 54 U/L   Alkaline Phosphatase 53 38 - 126 U/L   Total Bilirubin 0.6 0.3 - 1.2 mg/dL   GFR calc non Af Amer 30 (L) >60 mL/min   GFR calc Af Amer 35 (L) >60 mL/min   Anion gap 6 5 - 15  CBC  Result Value Ref Range   WBC 8.1 4.0 - 10.5 K/uL   RBC 3.55 (L) 3.87 - 5.11 MIL/uL   Hemoglobin 9.4 (L) 12.0 - 15.0 g/dL   HCT 28.4 (L) 36.0 - 46.0 %   MCV 80.0 78.0 - 100.0 fL   MCH 26.5 26.0 - 34.0 pg   MCHC 33.1 30.0 - 36.0 g/dL   RDW 16.1 (H) 11.5 - 15.5 %   Platelets 238 150 - 400 K/uL  Urinalysis, Routine w reflex microscopic (not at [facility])  Result Value Ref Range   Color, Urine YELLOW YELLOW   APPearance CLEAR CLEAR   Specific Gravity, Urine 1.015 1.005 - 1.030   pH 5.5 5.0 - 8.0   Glucose, UA NEGATIVE NEGATIVE mg/dL   Hgb urine dipstick MODERATE (A) NEGATIVE   Bilirubin Urine NEGATIVE NEGATIVE   Ketones, ur NEGATIVE NEGATIVE mg/dL   Protein, ur 100 (A) NEGATIVE mg/dL   Urobilinogen, UA 0.2 0.0 - 1.0 mg/dL   Nitrite NEGATIVE NEGATIVE   Leukocytes, UA NEGATIVE NEGATIVE  Basic metabolic panel  Result Value Ref Range    Sodium 145 135 - 145 mmol/L   Potassium 3.1 (L) 3.5 - 5.1 mmol/L   Chloride 113 (H) 101 - 111 mmol/L   CO2 25 22 - 32 mmol/L   Glucose, Bld 101 (H) 65 - 99 mg/dL   BUN 18 6 - 20 mg/dL   Creatinine, Ser 1.59 (H) 0.44 - 1.00 mg/dL   Calcium 8.2 (L) 8.9 - 10.3 mg/dL   GFR calc non Af Amer 33 (L) >60 mL/min   GFR calc Af Amer 38 (L) >60 mL/min   Anion gap 7 5 - 15  CBC  Result Value Ref Range   WBC 6.2 4.0 - 10.5 K/uL   RBC 2.66 (L) 3.87 - 5.11 MIL/uL   Hemoglobin 7.2 (L) 12.0 - 15.0 g/dL   HCT 21.4 (L) 36.0 - 46.0 %   MCV 80.5 78.0 - 100.0 fL   MCH 27.1 26.0 - 34.0 pg   MCHC 33.6 30.0 - 36.0 g/dL   RDW 16.6 (H) 11.5 - 15.5 %   Platelets 165 150 - 400 K/uL  Differential  Result Value Ref Range   Neutrophils Relative % 72 %   Neutro Abs 5.7 1.7 - 7.7 K/uL   Lymphocytes Relative 18 %   Lymphs Abs 1.4 0.7 - 4.0 K/uL   Monocytes Relative 8 %   Monocytes Absolute 0.6 0.1 - 1.0 K/uL   Eosinophils Relative 2 %   Eosinophils Absolute 0.2 0.0 - 0.7 K/uL   Basophils Relative 0 %   Basophils Absolute 0.0 0.0 - 0.1 K/uL  Urine rapid drug screen (hosp performed)  Result Value Ref Range   Opiates NONE DETECTED NONE DETECTED   Cocaine POSITIVE (A) NONE DETECTED   Benzodiazepines NONE DETECTED NONE DETECTED   Amphetamines NONE DETECTED NONE DETECTED   Tetrahydrocannabinol NONE DETECTED NONE DETECTED   Barbiturates NONE DETECTED NONE DETECTED  Urine microscopic-add on  Result Value Ref Range   Squamous Epithelial / LPF RARE RARE   WBC, UA 0-2 <3 WBC/hpf   RBC / HPF 0-2 <3 RBC/hpf   Casts HYALINE CASTS (A) NEGATIVE  Differential  Result Value Ref Range   Neutrophils Relative % 69 %   Neutro Abs 4.3 1.7 - 7.7 K/uL   Lymphocytes Relative 20 %   Lymphs Abs 1.2 0.7 - 4.0 K/uL   Monocytes Relative 10 %   Monocytes Absolute 0.6 0.1 - 1.0 K/uL   Eosinophils Relative 1 %   Eosinophils Absolute 0.1 0.0 - 0.7 K/uL   Basophils Relative 0 %   Basophils Absolute 0.0 0.0 - 0.1 K/uL  CBC   Result Value Ref Range   WBC 5.3 4.0 - 10.5 K/uL   RBC 2.68 (L) 3.87 - 5.11 MIL/uL   Hemoglobin 7.4 (L) 12.0 - 15.0 g/dL   HCT 22.0 (L) 36.0 - 46.0 %   MCV 82.1 78.0 - 100.0 fL   MCH 27.6 26.0 - 34.0 pg   MCHC 33.6 30.0 - 36.0 g/dL   RDW 17.0 (H) 11.5 - 15.5 %   Platelets 142 (L) 150 - 400 K/uL  I-Stat CG4 Lactic Acid, ED  Result Value Ref Range   Lactic Acid, Venous 1.29 0.5 - 2.0 mmol/L  Occult blood, poc device  Result Value Ref Range   Fecal Occult Bld POSITIVE (A) NEGATIVE  Sample to Blood Bank  Result Value Ref Range   Blood Bank Specimen SAMPLE AVAILABLE FOR TESTING    Sample Expiration 06/26/2015   Type and screen  Result Value Ref Range   ABO/RH(D) A POS    Antibody Screen NEG    Sample Expiration 06/28/2015    Unit Number [id number]    Blood Component Type RED CELLS,LR    Unit division 00    Status of Unit ISSUED    Transfusion Status OK TO TRANSFUSE    Crossmatch Result Compatible   Prepare RBC  Result Value Ref Range   Order Confirmation ORDER PROCESSED BY BLOOD BANK    I personally reviewed and evaluated these  lab results as a part of my medical decision-making.    MDM   Final diagnoses:  Lower gastrointestinal bleeding    Lower gastrointestinal bleeding. When in the ED, she did have a large bowel movement of dark red blood. Hemodynamics are stable. She is not tachycardic or hypotensive. Hemoglobin is only slightly decreased from baseline, but will probably drop with equilibration and IV hydration. Old records are reviewed and she has several hospitalizations for lower GI bleed so which have been felt to be due to diverticulosis. Case is discussed with Dr. Cyndia Skeeters of family practice service who agrees to admit the patient.   I, Chayah Mckee, personally performed the services described in this documentation. All medical record entries made by the scribe were at my direction and in my presence.  I have reviewed the chart and discharge instructions and  agree that the record reflects my personal performance and is accurate and complete. Idus Rathke.  [id number]. 3:20 AM.      Delora Fuel, MD [phone] [id number]

## 2015-06-25 NOTE — ED Notes (Signed)
Admitting team at bedside.

## 2015-06-25 NOTE — ED Notes (Signed)
Dr.Glick informed of dark red stool

## 2015-06-25 NOTE — H&P (Signed)
[facility] Admission History and Physical Service Pager: [phone]  Patient name: Jennifer Foster Medical record number: [id number] Date of birth: [birth year] Age: 69 y.o. Gender: female  Primary Care Provider: Andrena Mews, MD Consultants: GI in the AM Code Status: full (obtained on admission)  Chief Complaint: Rectal bleeding  Assessment and Plan: Jennifer Foster is a 69 y.o. female presenting with GI bleeding . PMH is significant for diverticular bleed, hypertension,  CKD-3, hyperlipidemia, gout, depression, GAD and anemia.  Rectal bleeding: painless bleeding likely diverticular or internal hemorrhoid not noted on DRE. Colonoscopy in 11/2013 significant for a single polyp and multiple diverticula, thought to be source of her bleeding at that time. Capsule endoscopy at the same time revealed small non-bleeding AVMs but not thought to be the course of the bleed per Dr. Benson Norway. Dr. Benson Norway recommended routine CBCs and iron supplementation at that time.  Rectal exam with no profuse bleeding or palpable hemorroid, but maroon blood on gloves. Patient hemodynamically stable which makes upper GI bleed unlikely. Hgb 9.4 from 10.7 in 10/2014. Cancer unlikely without constitutional symptoms however weight is down from 188lb 6/18 to 171 on admission. Recent colonoscopy benign as well. Mesenteric ischemia and IBD less likely without pain.  - Admit to step down. Attending Dr. Mingo Amber - Secure two bore IV lines - CBC in am (Hgb 9.4>) - Type and screen - Protonix IV for potential upper GI source although this is unlikely given that patient is HDS. - GI consult in am - NPO. Last meal and drink before mid-night - IVF at 125 ml/hr - Monitor vital signs and further bleeding  Anemia: Hgb 9.4 MCV 80%. Most recent Hgb in 2/16 10.7. Appears a mix of IDA and ACD -monitor H&H - consider iron supplementation after discussing options with GI  CKD-3: b/l Cr 1.3-1.6. No Bmet in ED -will  check Bmet  HTN: normotensive. On coreg 12.5 mg twice a day and amlodipine 10 mg daily at home -hold home BP meds for now   HLD: will continue home Crestor 20 mg  Gout: Stable now. States taking prednisone for recent flare. Also takes Norco at home. -Can restart Norco prn  Depression/GAD: on Paxil 20 mg dialy but not sure if she is still taking. She recently requested this from her PCP in 6/16 but indicated to the pharmacy tech she was not taking -resume in the morning if she still takes paxil  FEN/GI: NPO, NS 125/hr  Prophylaxis: protonix, scds  Disposition: step down   History of Present Illness:  Jennifer Foster is a 69 y.o. female presenting with a gastrointestinal bleeding.  This afternoon/evening the patient noted she went to have a bowel movement and she started passing blood clots. The blood is maroon. She's had approximately 10 BMs since that time.  No abdominal pain.  Several years ago she had bright red blood per rectum. Has had to strain to have a BM approximately 2 days ago. She endorses lightheadedness.   Off note, patient was hospitalized for GI bleed in 11/2013. She underwent EGD, which was normal. Colonoscopy revealed a single polyp and multiple diverticula. Capsule endoscopy revealed small non-bleeding AVMs. At that time, her bleeding was attributed to diverticulosis.  Patient denies chest pain, problems breathing, diarrhea, constipation & N/V.  Has had gout in her L knee and R foot. She started taking hydrocodone and prednisone for the gout. Has also taken Tylenol.  She is not on blood thinners or NSAIDS. She does take  a baby aspirin.   ED course: CBC remarkable for Hgb of 9.4. Type and screen.  Review Of Systems: Per HPI with the following additions: none  Otherwise 12 point review of systems was performed and was unremarkable.  Patient Active Problem List   Diagnosis Date Noted  . Lower gastrointestinal bleeding 06/25/2015  . Headache 02/20/2015  . Gout  02/28/2014  . Left ankle pain 10/12/2013  . Wrist pain, right 10/12/2013  . Bilateral leg edema 03/15/2013  . Depression 02/15/2013  . Poor dentition 04/05/2012  . Bony callus 09/08/2011  . Lipoma 03/19/2011  . Cocaine use 01/22/2011  . Hyperlipidemia with target LDL less than 100 01/01/2011  . ANEMIA 11/23/2009  . Gastritis and gastroduodenitis 11/23/2009  . TOBACCO USER 09/03/2009  . Generalized anxiety disorder 08/13/2009  . CHRONIC KIDNEY DISEASE STAGE III (MODERATE) 05/21/2009  . HYPERTENSION, BENIGN ESSENTIAL 10/14/2007  . DIVERTICULAR BLEEDING, HX OF 10/14/2007   Past Medical History: Past Medical History  Diagnosis Date  . Hypertension   . Joint pain   . GERD (gastroesophageal reflux disease)   . Hyperlipemia   . History of asthma   . History of anemia   . Lipoma   . Asthma    Past Surgical History: Past Surgical History  Procedure Laterality Date  . Abdominal hysterectomy  1981    partial, per pt history  . Lipoma excision  01/28/11    neck  . Colonoscopy N/A 12/08/2013    Procedure: COLONOSCOPY;  Surgeon: [name], MD;  Location: [place];  Service: Endoscopy;  Laterality: N/A;  . Esophagogastroduodenoscopy N/A 12/08/2013    Procedure: ESOPHAGOGASTRODUODENOSCOPY (EGD);  Surgeon: [name], MD;  [facility] END[facility]COPY;  Service: Endoscopy;  Laterality: N/A;  . Givens capsule study N/A 12/08/2013    Procedure: GIVENS CAPSULE STUDY;  Surgeon: [name], MD;  Location: [place];  Service: Endoscopy;  Laterality: N/A;   Social History: Social History  Substance Use Topics  . Smoking status: Current Some Day Smoker -- 0.25 packs/day    Types: Cigarettes  . Smokeless tobacco: Never Used  . Alcohol Use: 0.0 oz/week    0 Standard drinks or equivalent per week     Comment: occasionally   Additional social history: smokes 6 cigarettes per day, drinks 1-2 40z beer a few times a week, last on Saturday.   Please also refer to relevant sections of  EMR.  Family History: Family History  Problem Relation Age of Onset  . Diabetes type II    . Kidney failure    . Kidney failure Son    Allergies and Medications: Allergies  Allergen Reactions  . Ace Inhibitors Swelling  . Lisinopril Swelling    Swollen tongue   . Tramadol Itching   No current facility-administered medications on file prior to encounter.   Current Outpatient Prescriptions on File Prior to Encounter  Medication Sig Dispense Refill  . amLODipine (NORVASC) 10 MG tablet Take 1 tablet (10 mg total) by mouth daily. 90 tablet 1  . aspirin EC 81 MG EC tablet Take 1 tablet (81 mg total) by mouth daily. 30 tablet 1  . carvedilol (COREG) 12.5 MG tablet Take 1 tablet (12.5 mg total) by mouth 2 (two) times daily with a meal. 180 tablet 1  . rosuvastatin (CRESTOR) 20 MG tablet Take 1 tablet (20 mg total) by mouth at bedtime. 90 tablet 1  . albuterol (PROVENTIL HFA;VENTOLIN HFA) 108 (90 BASE) MCG/ACT inhaler Inhale 2 puffs into the lungs every  6 (six) hours as needed for wheezing or shortness of breath.    [name] HYDROcodone-acetaminophen (NORCO/VICODIN) 5-325 MG per tablet Take 1 tablet by mouth every 4 (four) hours as needed. (Patient not taking: Reported on 06/25/2015) 6 tablet 0  . nicotine polacrilex (NICORETTE) 2 MG gum Take 1 each (2 mg total) by mouth as needed for smoking cessation. (Patient not taking: Reported on 12/12/2014) 100 tablet 0  . pantoprazole (PROTONIX) 40 MG tablet Take 1 tablet (40 mg total) by mouth daily. (Patient not taking: Reported on 06/25/2015) 30 tablet 2  . PARoxetine (PAXIL) 20 MG tablet Take 1 tablet (20 mg total) by mouth daily. (Patient not taking: Reported on 06/25/2015) 30 tablet 2  . polyethylene glycol (MIRALAX / GLYCOLAX) packet Take 17 g by mouth daily as needed. (Patient not taking: Reported on 08/11/2014) 14 each 3  . predniSONE (DELTASONE) 20 MG tablet Take 2 tablets (40 mg total) by mouth daily. (Patient not taking: Reported on 06/25/2015) 10  tablet 0  . triamcinolone cream (KENALOG) 0.1 % Apply 1 application topically 2 (two) times daily. (Patient not taking: Reported on 06/25/2015) 30 g 0    Objective: BP 151/84 mmHg  Pulse 86  Temp(Src) 98.5 F (36.9 C)  Resp 18  Ht 5\' 4"  (1.626 m)  SpO2 98% Exam: Gen: lying in bed, able to sit up in bed for exam, well appearing but tearful towards the end. Eyes: PERRLA, sclera anicteric, pale conjunctivae. Oropharynx: clear, moist CV: RRR. S1 & S2 audible, 1/6 SEM over RUSB. 2+ radial and DP pulses bilaterally. Resp: no apparent WOB, CTAB. Abd: +BS. Soft, NDNT, no rebound or guarding.  Rectal exam: no external or internal hemorrhoid, no profuse bleeding, maroon blood on gloves Ext: No edema or gross deformities. Neuro: Alert and oriented, No gross focal deficits  Labs and Imaging: CBC BMET   Recent Labs Lab 06/25/15 0143  WBC 8.1  HGB 9.4*  HCT 28.4*  PLT 238   No results for input(s): NA, K, CL, CO2, BUN, CREATININE, GLUCOSE, CALCIUM in the last 168 hours.    Mercy Riding, MD 06/25/2015, 4:54 AM PGY-1, [place] Intern pager: [phone], text pages welcome  Upper Level Addendum:  I have seen and evaluated this patient along with Dr. Cyndia Skeeters and reviewed the above note, making necessary revisions in purple.   Archie Patten, MD [place] Resident, PGY-2

## 2015-06-25 NOTE — Progress Notes (Signed)
Utilization Review Completed.Donne Anon T10/24/2016

## 2015-06-26 ENCOUNTER — Encounter (HOSPITAL_COMMUNITY): Payer: Self-pay | Admitting: Family Medicine

## 2015-06-26 DIAGNOSIS — D62 Acute posthemorrhagic anemia: Secondary | ICD-10-CM | POA: Diagnosis present

## 2015-06-26 DIAGNOSIS — I10 Essential (primary) hypertension: Secondary | ICD-10-CM | POA: Diagnosis present

## 2015-06-26 DIAGNOSIS — F1410 Cocaine abuse, uncomplicated: Secondary | ICD-10-CM | POA: Diagnosis present

## 2015-06-26 HISTORY — DX: Acute posthemorrhagic anemia: D62

## 2015-06-26 LAB — CBC WITH DIFFERENTIAL/PLATELET
BASOS ABS: 0 10*3/uL (ref 0.0–0.1)
BASOS PCT: 0 %
Eosinophils Absolute: 0.1 10*3/uL (ref 0.0–0.7)
Eosinophils Relative: 2 %
HEMATOCRIT: 24.2 % — AB (ref 36.0–46.0)
HEMOGLOBIN: 8.1 g/dL — AB (ref 12.0–15.0)
Lymphocytes Relative: 26 %
Lymphs Abs: 1.5 10*3/uL (ref 0.7–4.0)
MCH: 27.4 pg (ref 26.0–34.0)
MCHC: 33.5 g/dL (ref 30.0–36.0)
MCV: 81.8 fL (ref 78.0–100.0)
Monocytes Absolute: 0.4 10*3/uL (ref 0.1–1.0)
Monocytes Relative: 7 %
NEUTROS ABS: 3.9 10*3/uL (ref 1.7–7.7)
NEUTROS PCT: 65 %
Platelets: 137 10*3/uL — ABNORMAL LOW (ref 150–400)
RBC: 2.96 MIL/uL — ABNORMAL LOW (ref 3.87–5.11)
RDW: 16.6 % — ABNORMAL HIGH (ref 11.5–15.5)
WBC: 5.9 10*3/uL (ref 4.0–10.5)

## 2015-06-26 LAB — BASIC METABOLIC PANEL
ANION GAP: 6 (ref 5–15)
ANION GAP: 6 (ref 5–15)
BUN: 11 mg/dL (ref 6–20)
BUN: 9 mg/dL (ref 6–20)
CALCIUM: 8.3 mg/dL — AB (ref 8.9–10.3)
CHLORIDE: 110 mmol/L (ref 101–111)
CHLORIDE: 115 mmol/L — AB (ref 101–111)
CO2: 24 mmol/L (ref 22–32)
CO2: 22 mmol/L (ref 22–32)
Calcium: 8 mg/dL — ABNORMAL LOW (ref 8.9–10.3)
Creatinine, Ser: 1.28 mg/dL — ABNORMAL HIGH (ref 0.44–1.00)
Creatinine, Ser: 1.35 mg/dL — ABNORMAL HIGH (ref 0.44–1.00)
GFR calc non Af Amer: 42 mL/min — ABNORMAL LOW (ref >60)
GFR calc non Af Amer: 40 mL/min — ABNORMAL LOW (ref >60)
GFR, EST AFRICAN AMERICAN: 49 mL/min — AB (ref >60)
GFR, EST AFRICAN AMERICAN: 46 mL/min — AB (ref >60)
Glucose, Bld: 89 mg/dL (ref 65–99)
Glucose, Bld: 132 mg/dL — ABNORMAL HIGH (ref 65–99)
POTASSIUM: 2.7 mmol/L — AB (ref 3.5–5.1)
POTASSIUM: 3.9 mmol/L (ref 3.5–5.1)
Sodium: 140 mmol/L (ref 135–145)
Sodium: 143 mmol/L (ref 135–145)

## 2015-06-26 MED ORDER — PANTOPRAZOLE SODIUM 40 MG PO TBEC
40.0000 mg | DELAYED_RELEASE_TABLET | Freq: Every day | ORAL | Status: DC
  Administered 2015-06-27: 40 mg via ORAL
  Filled 2015-06-26: qty 1

## 2015-06-26 MED ORDER — POTASSIUM CHLORIDE 10 MEQ/100ML IV SOLN
10.0000 meq | INTRAVENOUS | Status: AC
  Administered 2015-06-26 (×4): 10 meq via INTRAVENOUS
  Filled 2015-06-26 (×4): qty 100

## 2015-06-26 MED ORDER — POTASSIUM CHLORIDE CRYS ER 20 MEQ PO TBCR
40.0000 meq | EXTENDED_RELEASE_TABLET | Freq: Three times a day (TID) | ORAL | Status: AC
  Administered 2015-06-26 (×3): 40 meq via ORAL
  Filled 2015-06-26 (×3): qty 2

## 2015-06-26 NOTE — Progress Notes (Signed)
Family Medicine Teaching Service Daily Progress Note Intern Pager: [phone]  Patient name: Jennifer Foster Medical record number: [id number] Date of birth: 1946-07-23 Age: 69 y.o. Gender: female  Primary Care Provider: Andrena Mews, MD Consultants: GI Code Status: full  Pt Overview and Major Events to Date:  10/10-admitted with rectal bleeding  Assessment and Plan: Jennifer Foster is a 69 y.o. female presenting with GI bleeding . PMH is significant for diverticular bleed, hypertension, CKD-3, hyperlipidemia, gout, depression, GAD and anemia.  Rectal bleeding: painless bleeding likely diverticular or internal hemorrhoid not noted on DRE. Colonoscopy in 11/2013 significant for a single polyp and multiple diverticula, thought to be source of her bleeding at that time. Capsule endoscopy at the same time revealed small non-bleeding AVMs but not thought to be the course of the bleed per Dr. Benson Norway. Dr. Benson Norway recommended routine CBCs and iron supplementation at that time. Rectal exam with no profuse bleeding or palpable hemorroid, but maroon blood on gloves. Patient hemodynamically stable which makes upper GI bleed unlikely. Hgb 9.4 from 10.7 in 10/2014. Cancer unlikely without constitutional symptoms however weight is down from 188lb 6/18 to 171 on admission. Recent colonoscopy benign as well. Mesenteric ischemia and IBD less likely without pain.  - Admit to step down. Attending Dr. Mingo Amber - appreciate GI recs:  -Likely diverticular bleeding  -consider nuclear bleeding scan if new bleed. May have old blood - Secure two bore IV lines - Trend CBC (Hgb 9.4>7.2>1u>8.7>8.1) - Protonix IV for potential upper GI source although this is unlikely given that patient is HDS. - ADAT - Monitor vital signs and further bleeding  Anemia: Hgb 9.4 MCV 80%. Most recent Hgb in 2/16 10.7. Appears a mix of IDA and ACD -monitor H&H - consider iron supplementation after discussing options with GI  CKD-3: b/l Cr  1.3-1.6. No Bmet in ED -will check Bmet  HTN: one isolated BP to 176/70. On coreg 12.5 mg twice a day and amlodipine 10 mg daily at home -continue home meds -Consider adding HCTZ if continues to rise or doesn't trend down  HLD: will continue home Crestor 20 mg  Gout: Stable now. States taking prednisone for recent flare. Also takes Norco at home. -Can restart Norco prn  Depression/GAD: Paxil 20 mg dialy on her chart. Hasn't taken this in about a month. -leave this for PCP  Sub use: UDS +ve for cocaine -Counseling on substance use.  Tobacco use:  -consider discharging her on nicotine gums -Welbuterin could help with this and depression as well.  FEN/GI:  -regular diet  Prophylaxis: protonix, scds  Disposition: transfer to floor and observe for bleeding.  Subjective:  Reports one maroon looking bloody BM yesterday in the afternoon. Denies dizziness with ambulation, shortness of breath and chest pain.  Objective: Temp:  [97.8 F (36.6 C)-99.1 F (37.3 C)] 98.6 F (37 C) (10/25 0315) Pulse Rate:  [60-71] 71 (10/25 0751) Resp:  [10-23] 17 (10/25 0751) BP: (143-213)/(35-182) 176/70 mmHg (10/25 0751) SpO2:  [92 %-100 %] 94 % (10/25 0751) Physical Exam: Gen: well-appearing  Eyes: PERRLA, conjunctivae pale Nares: clear, no erythema, swelling or congestion Oropharynx: clear, moist CV: RRR. S1 & S2 audible, no murmurs. 2+ radial and DP pulses bilaterally. Resp: no apparent WOB, CTAB. Abd: +BS. Soft, NDNT, no rebound or guarding.  Ext: No edema or gross deformities. Neuro: Alert and oriented, No gross focal deficits  Laboratory:  Recent Labs Lab 06/25/15 1626 06/25/15 2337 06/26/15 0535  WBC 5.3 6.5 5.9  HGB 7.4*  8.7* 8.1*  HCT 22.0* 25.7* 24.2*  PLT 142* 155 137*    Recent Labs Lab 06/25/15 0143 06/25/15 1024 06/26/15 0535  NA 140 145 140  K 3.5 3.1* 2.7*  CL 111 113* 110  CO2 23 25 24   BUN 20 18 11   CREATININE 1.68* 1.59* 1.28*  CALCIUM 8.4* 8.2* 8.0*   PROT 6.0*  --   --   BILITOT 0.6  --   --   ALKPHOS 53  --   --   ALT 10*  --   --   AST 15  --   --   GLUCOSE 144* 101* 89    Imaging/Diagnostic Tests: No results found.  Mercy Riding, MD 06/26/2015, 8:09 AM PGY-1, [place] Intern pager: [phone], text pages welcome

## 2015-06-26 NOTE — Progress Notes (Signed)
Transferred to [place] room 9 by wheelchair, stable, report given to RN.Belongings with pt.

## 2015-06-26 NOTE — Progress Notes (Addendum)
Jennifer Foster 9:26 AM  Subjective: Patient doing well without signs of bleeding this morning and only one bloody bowel movement yesterday and no new complaints and she had questions about her blood pressure  Objective: Vital signs stable afebrile no acute distress abdomen is soft nontender slight decrease hemoglobin but decreased BUN and creatinine as well  Assessment: Probable diverticular bleeding  Plan: Continue observation if signs of bleeding increase consider nuclear bleeding scan however if next bowel movement has only old blood can advance diet and hopefully home soon and okay with me to move out of unit  [facility] E  Pager [phone] After 5PM or if no answer call [phone]

## 2015-06-26 NOTE — Clinical Documentation Improvement (Signed)
Internal Medicine  Can the diagnosis of anemia be further specified?   Iron deficiency Anemia  Nutritional anemia, including the nutrition or mineral deficits  Chronic Anemia, including the suspected or known cause  Anemia of chronic disease, including the associated chronic disease state  Acute blood loss anemia  Other  Clinically Undetermined  Document any associated diagnoses/conditions.   Supporting Information: ED note:Lower gastrointestinal bleeding. When in the ED, she did have a large bowel movement of dark red blood.  "Gastrointestinal: Positive for nausea, blood in stool and hematochezia.Conjunctival pallor  RBC 3.55  Hgb 9.4    Fecal Occult Bld POSITIVE (A)       Component     Latest Ref Rng 06/25/2015 06/25/2015 06/25/2015 06/25/2015         1:43 AM 10:24 AM  4:26 PM 11:37 PM  Hemoglobin     12.0 - 15.0 g/dL 9.4 (L) 7.2 (L) 7.4 (L) 8.7 (L)  HCT     36.0 - 46.0 % 28.4 (L) 21.4 (L) 22.0 (L) 25.7 (L)   Component     Latest Ref Rng 06/26/2015          Hemoglobin     12.0 - 15.0 g/dL 8.1 (L)  HCT     36.0 - 46.0 % 24.2 (L)   Treatment: Repeat CBC, IVF 125 ml/hr GI consult  Transfusion  RBC 06/25/15  Please exercise your independent, professional judgment when responding. A specific answer is not anticipated or expected. Please update your documentation within the medical record to reflect your response to this query. Thank you  Thank You,  [place] [phone]

## 2015-06-26 NOTE — Progress Notes (Signed)
Admission note:  Arrival Method: Patient arrived to this unit from [place] in bed with family and staff accompanying. Mental Orientation:  Alert and oriented x 4. Telemetry: 6E-09, NSR, CCMD notified. Assessment: See doc flow sheets. Skin: Warm, dry and intact, no open areas or any wounds noted. Assessed by two nurses Myriam Jacobson). IV: Left AC infusing NS 100 ml/hr and Left FA saline lock.  Pain: Denies any pain currently. Tubes: N/A Safety Measures: Bed in low position, call bell and phone within reach.   Fall Prevention Safety Plan: Reviewed the plan with patient, understood and acknowledged but she refused bed alarm.  She had fall one month ago using crutches. Admission Screening: Completed. 6700 Orientation: Patient has been oriented to the unit, staff and to the room.

## 2015-06-26 NOTE — Progress Notes (Signed)
CRITICAL VALUE ALERT  Critical value received:  k 2.7  Date of notification:  06/26/2015  Time of notification:  0653  Critical value read back:yes  Nurse who received alert:  Purcell Nails  MD notified (1st page):  [phone]  Time of first page:  0655  MD notified (2nd page):  Time of second page:  Responding MD:  Cyndia Skeeters  Time MD responded:  [phone]

## 2015-06-27 ENCOUNTER — Inpatient Hospital Stay (HOSPITAL_COMMUNITY): Payer: Commercial Managed Care - HMO

## 2015-06-27 ENCOUNTER — Encounter (HOSPITAL_COMMUNITY): Payer: Self-pay | Admitting: Family Medicine

## 2015-06-27 DIAGNOSIS — F329 Major depressive disorder, single episode, unspecified: Secondary | ICD-10-CM

## 2015-06-27 DIAGNOSIS — F17200 Nicotine dependence, unspecified, uncomplicated: Secondary | ICD-10-CM

## 2015-06-27 DIAGNOSIS — E785 Hyperlipidemia, unspecified: Secondary | ICD-10-CM

## 2015-06-27 DIAGNOSIS — N184 Chronic kidney disease, stage 4 (severe): Secondary | ICD-10-CM | POA: Diagnosis present

## 2015-06-27 DIAGNOSIS — N183 Chronic kidney disease, stage 3 unspecified: Secondary | ICD-10-CM

## 2015-06-27 HISTORY — DX: Chronic kidney disease, stage 3 unspecified: N18.30

## 2015-06-27 LAB — CBC
HCT: 22.5 % — ABNORMAL LOW (ref 36.0–46.0)
HCT: 20.6 % — ABNORMAL LOW (ref 36.0–46.0)
HEMATOCRIT: 21.3 % — AB (ref 36.0–46.0)
HEMOGLOBIN: 7.6 g/dL — AB (ref 12.0–15.0)
Hemoglobin: 7.1 g/dL — ABNORMAL LOW (ref 12.0–15.0)
Hemoglobin: 7.1 g/dL — ABNORMAL LOW (ref 12.0–15.0)
MCH: 27.7 pg (ref 26.0–34.0)
MCH: 28.4 pg (ref 26.0–34.0)
MCH: 28.2 pg (ref 26.0–34.0)
MCHC: 33.3 g/dL (ref 30.0–36.0)
MCHC: 33.8 g/dL (ref 30.0–36.0)
MCHC: 34.5 g/dL (ref 30.0–36.0)
MCV: 83.2 fL (ref 78.0–100.0)
MCV: 84 fL (ref 78.0–100.0)
MCV: 81.7 fL (ref 78.0–100.0)
PLATELETS: 142 10*3/uL — AB (ref 150–400)
PLATELETS: 131 10*3/uL — AB (ref 150–400)
Platelets: 154 10*3/uL (ref 150–400)
RBC: 2.56 MIL/uL — ABNORMAL LOW (ref 3.87–5.11)
RBC: 2.68 MIL/uL — ABNORMAL LOW (ref 3.87–5.11)
RBC: 2.52 MIL/uL — AB (ref 3.87–5.11)
RDW: 17.1 % — AB (ref 11.5–15.5)
RDW: 17.3 % — AB (ref 11.5–15.5)
RDW: 17.2 % — AB (ref 11.5–15.5)
WBC: 5.9 10*3/uL (ref 4.0–10.5)
WBC: 5.7 10*3/uL (ref 4.0–10.5)
WBC: 4.7 10*3/uL (ref 4.0–10.5)

## 2015-06-27 MED ORDER — LIDOCAINE HCL 1 % IJ SOLN
INTRAMUSCULAR | Status: AC
  Filled 2015-06-27: qty 20

## 2015-06-27 MED ORDER — PANTOPRAZOLE SODIUM 40 MG IV SOLR: 40.0000 mg | Freq: Two times a day (BID) | INTRAVENOUS | Status: DC

## 2015-06-27 MED ORDER — FENTANYL CITRATE (PF) 100 MCG/2ML IJ SOLN
INTRAMUSCULAR | Status: AC
  Filled 2015-06-27: qty 2

## 2015-06-27 MED ORDER — MIDAZOLAM HCL 2 MG/2ML IJ SOLN
INTRAMUSCULAR | Status: AC | PRN
  Administered 2015-06-27: 1 mg via INTRAVENOUS

## 2015-06-27 MED ORDER — FENTANYL CITRATE (PF) 100 MCG/2ML IJ SOLN
INTRAMUSCULAR | Status: AC | PRN
  Administered 2015-06-27: 25 ug via INTRAVENOUS

## 2015-06-27 MED ORDER — TECHNETIUM TC 99M-LABELED RED BLOOD CELLS IV KIT
25.0000 | PACK | Freq: Once | INTRAVENOUS | Status: AC | PRN
  Administered 2015-06-27: 25 via INTRAVENOUS

## 2015-06-27 MED ORDER — IOHEXOL 300 MG/ML  SOLN
150.0000 mL | Freq: Once | INTRAMUSCULAR | Status: DC | PRN
  Administered 2015-06-27: 130 mL via INTRAVENOUS
  Filled 2015-06-27: qty 150

## 2015-06-27 MED ORDER — DEXTROSE-NACL 5-0.9 % IV SOLN
INTRAVENOUS | Status: DC
  Administered 2015-06-27: 19:00:00 via INTRAVENOUS

## 2015-06-27 MED ORDER — MIDAZOLAM HCL 2 MG/2ML IJ SOLN
INTRAMUSCULAR | Status: AC
  Filled 2015-06-27: qty 2

## 2015-06-27 NOTE — Sedation Documentation (Signed)
Patient denies pain and is resting comfortably.  

## 2015-06-27 NOTE — Care Management Note (Signed)
Case Management Note  Patient Details  Name: Jennifer Foster MRN: [id number] Date of Birth: 1946-08-31  Subjective/Objective:         Admitted with GI Bleed, hx of diverticular bleed, hypertension, CKD-3, hyperlipidemia, gout, depression, GAD and anemia. Independent of ADL's.           Action/Plan: Return to home when medically stable. CM to f/u with discharge disposition.  Expected Discharge Date:                  Expected Discharge Plan:  Home/Self Care  In-House Referral:     Discharge planning Services  CM Consult  Post Acute Care Choice:    Choice offered to:     DME Arranged:    DME Agency:     HH Arranged:    HH Agency:     Status of Service:  In process, will continue to follow  Medicare Important Message Given:    Date Medicare IM Given:    Medicare IM give by:    Date Additional Medicare IM Given:    Additional Medicare Important Message give by:     If discussed at [place] of Stay Meetings, dates discussed:    Additional Comments: Alondra Usey (Spouse)  [phone]  [address] [place], [place] [phone] 06/27/2015, 12:14 PM

## 2015-06-27 NOTE — Discharge Summary (Signed)
[facility] Discharge Summary  Patient name: Jennifer Foster Medical record number: [id number] Date of birth: [birth year] Age: 69 y.o. Gender: female Date of Admission: 06/25/2015  Date of Discharge: 06/30/2015 Admitting Physician: Alveda Reasons, MD  Primary Care Provider: Andrena Mews, MD Consultants: GI  Indication for Hospitalization: rectal bleeding  Discharge Diagnoses/Problem List:  Anemia of blood loss from rectal bleeding Substance use Tobacco use Other chronic conditioins  Disposition: home  Discharge Condition: stable  Discharge Exam:  Gen: lying in bed, no acute distress  CV: RRR. S1 & S2 audible, no murmurs. Pulses intact Resp: normal WOB, CTAB.  Abd: +BS. Soft, NDNT, no rebound or guarding.  Ext: No edema or gross deformities. Neuro: Alert and oriented, No gross focal deficits  Brief Hospital Course:  Jennifer Foster is a 69 y.o. female presenting with GI bleeding and tenismus and admitted to step-down. PMH is significant for diverticular bleed, hypertension, CKD-3, hyperlipidemia, gout, depression, GAD and anemia.  Rectal bleeding: painless bleeding likely diverticular given history of diverticular bleeding in in the past. Patient on ASA 81 dialy. She also reported taking 500 mg unknown OTC meds for gout flare in addition to prednisone. Rectal exam with no profuse bleeding or palpable hemorroid, but maroon blood on gloves. Patient hemodynamically stable. Came in with Hgb of 9.4 (from 10.7 in 10/2014) that has trended down to 7.2 the night of admission (10/24).Transfused one unit of blood on 10/25 and Hgb up to 8.1 and stayed stable. Patient evaluated by [facility] who suggests observation for one day and discharge the next day if no active bleeding. However, she had another episode of bleeding and her Hgb dropped to 5.8 on 10/27. She was transfused another two units of blood on 10/27 and her hemoglobin came up to 7.5. Tc-39m labeled RBC  scan on 10/26 positive for acive bleeding from left descending colon. However, follow up 3-vessel mesenteric arteriogram and colonoscopy were negative for active bleed. She was observed in house until 10/29. She had no further bleeding. Her Hgb stayed stable at about 8.  She was discharged home with intstrucition to avoid NSAID, steroids and OTC pain medications. She was prescribed iron as well.   Anemia: Hgb 9.4 on 10/24 from 10.7 in 10/2014. Hgb fell to 7.2 overnight. She received one unit of blood on 10/25, and Hgb came back up to 8.1 and stayed stable awhile before it dropped down to 5.8 on 10/27 likely from lower GI bleeding. She received anther two units of blood and hemoglobin responded appropriately and stayed stable at about 8. Patient was discharged home on iron pills.  Gout: Stable now. States taking prednisone for recent flare which may have played role in her GI bleed. Recommned using colchicine instead of steroid and NSAID for gout flare. Recommend allopurinol and life style changes for flare prevention  Sub use: UDS +ve for cocaine. Encouraged to stop substance use. Also stopped beta-blocker although she was on carvedilol.  Tobacco use: patient showed interest in quitting smoking. She said she had nicotine gums at home that she is willing to use. She may benefit from bupropion given history of depression for which she was previously on Paxil.  Issues for Follow Up:  1. Anemia: check CBC at follow up 2. Gout: consider Allopurinol for flare prevention. Colchicine for flare. Avoid steroid and NSAID given her history of diverticular bleed 3. Substance use: more counseling. Discontinued her coreg. She also had low BP once while in house. 4. Tobacco  use: patient is interested in quitting. Plan to use nicotine gums after she leaves the hospital. She may benefit from bupropion given history of depression for which she was previously on Paxil. 5. BP: discontinued coreg while in hospital for two  reasons: low BP and cocaine use.  Significant Procedures: none  Significant Labs and Imaging:   Recent Labs Lab 06/29/15 0444 06/29/15 1005 06/30/15 0435  WBC 5.8 6.2 5.3  HGB 7.5* 8.1* 7.9*  HCT 22.0* 23.9* 23.0*  PLT 116* 123* 118*    Recent Labs Lab 06/25/15 0143  06/26/15 0535 06/26/15 1430 06/28/15 1755 06/29/15 0444 06/29/15 1005 06/30/15 0435  NA 140  < > 140 143 143 142  --  144  K 3.5  < > 2.7* 3.9 3.7 3.4*  --  3.6  CL 111  < > 110 115* 117* 115*  --  113*  CO2 23  < > 24 22 20* 22  --  22  GLUCOSE 144*  < > 89 132* 100* 87  --  85  BUN 20  < > 11 9 10 8   --  5*  CREATININE 1.68*  < > 1.28* 1.35* 1.72* 1.60*  --  1.45*  CALCIUM 8.4*  < > 8.0* 8.3* 8.2* 7.7*  --  8.4*  ALKPHOS 53  --   --   --   --   --   --   --   AST 15  --   --   --   --   --   --   --   ALT 10*  --   --   --   --   --   --   --   ALBUMIN 2.6*  --   --   --   --   --  2.3*  --   < > = values in this interval not displayed.  Results/Tests Pending at Time of Discharge: none  Discharge Medications:    Medication List    STOP taking these medications        carvedilol 12.5 MG tablet  Commonly known as:  COREG     HYDROcodone-acetaminophen 5-325 MG tablet  Commonly known as:  NORCO/VICODIN     nicotine polacrilex 2 MG gum  Commonly known as:  NICORETTE     PARoxetine 20 MG tablet  Commonly known as:  PAXIL     predniSONE 20 MG tablet  Commonly known as:  DELTASONE     triamcinolone cream 0.1 %  Commonly known as:  KENALOG      TAKE these medications        albuterol 108 (90 BASE) MCG/ACT inhaler  Commonly known as:  PROVENTIL HFA;VENTOLIN HFA  Inhale 2 puffs into the lungs every 6 (six) hours as needed for wheezing or shortness of breath.     amLODipine 10 MG tablet  Commonly known as:  NORVASC  Take 1 tablet (10 mg total) by mouth daily.     aspirin 81 MG EC tablet  Take 1 tablet (81 mg total) by mouth daily.     pantoprazole 40 MG tablet  Commonly known as:   PROTONIX  Take 1 tablet (40 mg total) by mouth daily.     polyethylene glycol packet  Commonly known as:  MIRALAX / GLYCOLAX  Take 17 g by mouth daily as needed.     rosuvastatin 20 MG tablet  Commonly known as:  CRESTOR  Take 1 tablet (20 mg total) by mouth at  bedtime.     TYLENOL ARTHRITIS EXT RELIEF PO  Take 1-2 tablets by mouth every 8 (eight) hours as needed (for pain).       Discharge Instructions: Please refer to Patient Instructions section of EMR for full details.  Patient was counseled important signs and symptoms that should prompt return to medical care, changes in medications, dietary instructions, activity restrictions, and follow up appointments.   Follow-Up Appointments: Follow-up Information    Follow up with [facility]. Go on 07/02/2015.   Why:  @10AM  FOR LAB APPOINTMENT FOR BLOOD CHECK   Contact information:   [place] [phone]      Follow up with [facility]. Go on 07/12/2015.   Why:  @1 :30p for hospital follow-up   Contact information:   [place]      Mercy Riding, MD 07/01/2015, 11:36 PM PGY-1, [place]

## 2015-06-27 NOTE — Progress Notes (Signed)
Jennifer Foster 4:31 PM  Subjective: Patient seen in nuclear medicine and case discussed with her husband and she's continued to have some bright red blood per rectum but none this afternoon and the test is only partially done but might be a question of bleeding in the left side of her colon but will wait on final report and no new complaints  Objective: Vital signs stable afebrile no acute distress patient not examined today she was under the nuclear medicine machine hemoglobin slight decrease nuclear med test pending  Assessment: Probable diverticular bleeding  Plan: Continue clear liquids if bleeding scan is positive please ask interventional radiology to proceed with angiography and possible coils to stop the bleeding and call us back tonight if any question or problem otherwise I will see tomorrow  [facility] E  Pager [phone] After 5PM or if no answer call [phone]

## 2015-06-27 NOTE — Discharge Instructions (Addendum)
Diverticulosis Diverticulosis is the condition that develops when small pouches (diverticula) form in the wall of your colon. Your colon, or large intestine, is where water is absorbed and stool is formed. The pouches form when the inside layer of your colon pushes through weak spots in the outer layers of your colon. CAUSES  No one knows exactly what causes diverticulosis. RISK FACTORS  Being older than 28. Your risk for this condition increases with age. Diverticulosis is rare in people younger than 40 years. By age 53, almost everyone has it.  Eating a low-fiber diet.  Being frequently constipated.  Being overweight.  Not getting enough exercise.  Smoking.  Taking over-the-counter pain medicines, like aspirin and ibuprofen. SYMPTOMS  Most people with diverticulosis do not have symptoms. DIAGNOSIS  Because diverticulosis often has no symptoms, health care providers often discover the condition during an exam for other colon problems. In many cases, a health care provider will diagnose diverticulosis while using a flexible scope to examine the colon (colonoscopy). TREATMENT  If you have never developed an infection related to diverticulosis, you may not need treatment. If you have had an infection before, treatment may include:  Eating more fruits, vegetables, and grains.  Taking a fiber supplement.  Taking a live bacteria supplement (probiotic).  Taking medicine to relax your colon. HOME CARE INSTRUCTIONS   Drink at least 6-8 glasses of water each day to prevent constipation.  Try not to strain when you have a bowel movement.  Keep all follow-up appointments. If you have had an infection before:  Increase the fiber in your diet as directed by your health care provider or dietitian.  Take a dietary fiber supplement if your health care provider approves.  Only take medicines as directed by your health care provider. SEEK MEDICAL CARE IF:   You have abdominal  pain.  You have bloating.  You have cramps.  You have not gone to the bathroom in 3 days. SEEK IMMEDIATE MEDICAL CARE IF:   Your pain gets worse.  Yourbloating becomes very bad.  You have a fever or chills, and your symptoms suddenly get worse.  You begin vomiting.  You have bowel movements that are bloody or black. MAKE SURE YOU:  Understand these instructions.  Will watch your condition.  Will get help right away if you are not doing well or get worse.   This information is not intended to replace advice given to you by your health care provider. Make sure you discuss any questions you have with your health care provider.   Document Released: 05/15/2004 Document Revised: 08/23/2013 Document Reviewed: 07/13/2013 Elsevier Interactive Patient Education 2016 [employer].  Anemia, Nonspecific Anemia is a condition in which the concentration of red blood cells or hemoglobin in the blood is below normal. Hemoglobin is a substance in red blood cells that carries oxygen to the tissues of the body. Anemia results in not enough oxygen reaching these tissues.  CAUSES  Common causes of anemia include:   Excessive bleeding. Bleeding may be internal or external. This includes excessive bleeding from periods (in women) or from the intestine.   Poor nutrition.   Chronic kidney, thyroid, and liver disease.  Bone marrow disorders that decrease red blood cell production.  Cancer and treatments for cancer.  HIV, AIDS, and their treatments.  Spleen problems that increase red blood cell destruction.  Blood disorders.  Excess destruction of red blood cells due to infection, medicines, and autoimmune disorders. SIGNS AND SYMPTOMS   Minor weakness.  Dizziness.   Headache.  Palpitations.   Shortness of breath, especially with exercise.   Paleness.  Cold sensitivity.  Indigestion.  Nausea.  Difficulty sleeping.  Difficulty concentrating. Symptoms may occur  suddenly or they may develop slowly.  DIAGNOSIS  Additional blood tests are often needed. These help your health care provider determine the best treatment. Your health care provider will check your stool for blood and look for other causes of blood loss.  TREATMENT  Treatment varies depending on the cause of the anemia. Treatment can include:   Supplements of iron, vitamin 123456, or folic acid.   Hormone medicines.   A blood transfusion. This may be needed if blood loss is severe.   Hospitalization. This may be needed if there is significant continual blood loss.   Dietary changes.  Spleen removal. HOME CARE INSTRUCTIONS Keep all follow-up appointments. It often takes many weeks to correct anemia, and having your health care provider check on your condition and your response to treatment is very important. SEEK IMMEDIATE MEDICAL CARE IF:   You develop extreme weakness, shortness of breath, or chest pain.   You become dizzy or have trouble concentrating.  You develop heavy vaginal bleeding.   You develop a rash.   You have bloody or black, tarry stools.   You faint.   You vomit up blood.   You vomit repeatedly.   You have abdominal pain.  You have a fever or persistent symptoms for more than 2-3 days.   You have a fever and your symptoms suddenly get worse.   You are dehydrated.  MAKE SURE YOU:  Understand these instructions.  Will watch your condition.  Will get help right away if you are not doing well or get worse.   This information is not intended to replace advice given to you by your health care provider. Make sure you discuss any questions you have with your health care provider.   Document Released: 09/25/2004 Document Revised: 04/20/2013 Document Reviewed: 02/11/2013 Elsevier Interactive Patient Education [employer].

## 2015-06-27 NOTE — Procedures (Signed)
3-vessel mesenteric arteriogram: negative No complication No blood loss. See complete dictation in [facility].

## 2015-06-27 NOTE — Progress Notes (Signed)
Report given to 3S RN. Pt transferred to 3S room 2.    .BP 136/67 mmHg  Pulse 66  Temp(Src) 98.6 F (37 C) (Oral)  Resp 19  Ht 5\' 4"  (1.626 m)  Wt 78.2 kg (172 lb 6.4 oz)  BMI 29.58 kg/m2  SpO2 100%

## 2015-06-27 NOTE — Progress Notes (Signed)
[facility] Teaching Service Daily Progress Note Intern Pager: [phone]  Patient name: Jennifer Foster Medical record number: [id number] Date of birth: [birth year] Age: 69 y.o. Gender: female  Primary Care Provider: Andrena Mews, MD Consultants: GI Code Status: full  Pt Overview and Major Events to Date:  10/10-admitted with rectal bleeding  Assessment and Plan: Jennifer Foster is a 69 y.o. female presenting with GI bleeding . PMH is significant for diverticular bleed, hypertension, CKD-3, hyperlipidemia, gout, depression, GAD and anemia.  Rectal bleeding: painless bleeding likely diverticular or internal hemorrhoid not noted on DRE. Colonoscopy in 11/2013 significant for a single polyp and multiple diverticula, thought to be source of her bleeding at that time. Capsule endoscopy at the same time revealed small non-bleeding AVMs but not thought to be the course of the bleed per Dr. Benson Norway. Dr. Benson Norway recommended routine CBCs and iron supplementation at that time. Rectal exam with no profuse bleeding or palpable hemorroid, but maroon blood on gloves. Patient hemodynamically stable which makes upper GI bleed unlikely. Hgb 9.4 from 10.7 in 10/2014. Cancer unlikely without constitutional symptoms however weight is down from 188lb 6/18 to 171 on admission. Recent colonoscopy benign as well. Mesenteric ischemia and IBD less likely without pain. Patient transferred to floor on 10/25. However, she had bleeding overnight. She reports multiple bowel movements with bright to maroon blood. Her Hgb dropped from 8.1 to 7.1. She denies dizziness or shortness of breath. She is hemodynamically stable. - Admit to step down. Attending Dr. Mingo Amber - Paged GI 10/26 - Secure two bore IV lines - Restart NS at 100 ml/hr - Trend CBC (Hgb 9.4>7.2>1u>8.7>8.1>7.1) - Protonix IV 40 mg twice a day for potential upper GI source although this is unlikely given that patient is HDS. - Monitor vital signs and further  bleeding  Anemia: Hgb 9.4 (on admission) to 7.2>1u pRBC>8.1>7.1. Most recent Hgb in 2/16 10.7. - monitor H&H - consider iron supplementation after discussing options with GI  CKD-3: b/l Cr 1.3-1.6. No Bmet in ED -will check Bmet  HTN: one isolated BP to 176/70. On coreg 12.5 mg twice a day and amlodipine 10 mg daily at home -continue home meds -Can consider holding if BP starts to go down. BP stable for now  HLD: will continue home Crestor 20 mg  Gout: Stable now. States taking prednisone for recent flare which may have played role in her GI bleed. Recommned using colchicine instead of steroid and NSAID for gout flare. Recommend allopurinol and life style changes for flare prevention  Sub use: UDS +ve for cocaine. Encouraged to stop substance use.  Depression/GAD: Paxil 20 mg dialy on her chart. Hasn't taken this in about a month. -leave this for PCP  Tobacco use:  -consider discharging her on nicotine gums -Welbuterin could help with this and depression as well.  FEN/GI:  -NPO -NS @100ml /hr  Prophylaxis: protonix, scds  Disposition: transfer step down and monitor bleeding. Follow up with GI. Already paged.  Subjective:  Reports reports multiple bowel movements with fresh and maroon blood over night. Denies shortness of breath or chest pain. Reports some abdominal cramping.  Objective: Temp:  [97.5 F (36.4 C)-98.6 F (37 C)] 98.3 F (36.8 C) (10/26 0500) Pulse Rate:  [66-72] 72 (10/26 0500) Resp:  [18-19] 18 (10/26 0500) BP: (148-164)/(55-87) 155/87 mmHg (10/26 0500) SpO2:  [95 %-100 %] 99 % (10/26 0500) Weight:  [172 lb 6.4 oz (78.2 kg)] 172 lb 6.4 oz (78.2 kg) (10/25 2100) Physical Exam: Gen: face  looked puffy, tearful, sitting on the edge of the bed.  Eyes: PERRLA, conjunctivae pale Nares: clear, no erythema, swelling or congestion Oropharynx: clear, moist CV: RRR. S1 & S2 audible, no murmurs. 2+ radial and DP pulses bilaterally. Resp: no apparent WOB,  CTAB. Abd: +BS. Soft, NDNT, no rebound or guarding.  Ext: No edema or gross deformities. Neuro: Alert and oriented, No gross focal deficits  Laboratory:  Recent Labs Lab 06/25/15 2337 06/26/15 0535 06/27/15 0454  WBC 6.5 5.9 5.9  HGB 8.7* 8.1* 7.1*  HCT 25.7* 24.2* 21.3*  PLT 155 137* 142*    Recent Labs Lab 06/25/15 0143 06/25/15 1024 06/26/15 0535 06/26/15 1430  NA 140 145 140 143  K 3.5 3.1* 2.7* 3.9  CL 111 113* 110 115*  CO2 23 25 24 22   BUN 20 18 11 9   CREATININE 1.68* 1.59* 1.28* 1.35*  CALCIUM 8.4* 8.2* 8.0* 8.3*  PROT 6.0*  --   --   --   BILITOT 0.6  --   --   --   ALKPHOS 53  --   --   --   ALT 10*  --   --   --   AST 15  --   --   --   GLUCOSE 144* 101* 89 132*    Imaging/Diagnostic Tests: No results found.  Mercy Riding, MD 06/27/2015, 9:37 AM PGY-1, [place] Intern pager: [phone], text pages welcome

## 2015-06-28 ENCOUNTER — Encounter (HOSPITAL_COMMUNITY)
Admission: EM | Disposition: A | Discharge status: Home / Self Care | Payer: Self-pay | Source: Home / Self Care | Attending: Family Medicine

## 2015-06-28 ENCOUNTER — Encounter (HOSPITAL_COMMUNITY): Payer: Self-pay | Admitting: *Deleted

## 2015-06-28 HISTORY — PX: COLONOSCOPY: SHX5424

## 2015-06-28 LAB — BASIC METABOLIC PANEL
ANION GAP: 6 (ref 5–15)
BUN: 10 mg/dL (ref 6–20)
CALCIUM: 8.2 mg/dL — AB (ref 8.9–10.3)
CO2: 20 mmol/L — ABNORMAL LOW (ref 22–32)
CREATININE: 1.72 mg/dL — AB (ref 0.44–1.00)
Chloride: 117 mmol/L — ABNORMAL HIGH (ref 101–111)
GFR, EST AFRICAN AMERICAN: 34 mL/min — AB (ref >60)
GFR, EST NON AFRICAN AMERICAN: 30 mL/min — AB (ref >60)
Glucose, Bld: 100 mg/dL — ABNORMAL HIGH (ref 65–99)
Potassium: 3.7 mmol/L (ref 3.5–5.1)
SODIUM: 143 mmol/L (ref 135–145)

## 2015-06-28 LAB — HEMOGLOBIN AND HEMATOCRIT, BLOOD
HEMATOCRIT: 17.4 % — AB (ref 36.0–46.0)
HEMOGLOBIN: 5.8 g/dL — AB (ref 12.0–15.0)

## 2015-06-28 LAB — FERRITIN: FERRITIN: 94 ng/mL (ref 11–307)

## 2015-06-28 LAB — PREPARE RBC (CROSSMATCH)

## 2015-06-28 SURGERY — COLONOSCOPY
Anesthesia: Moderate Sedation

## 2015-06-28 MED ORDER — POLYETHYLENE GLYCOL 3350 17 GM/SCOOP PO POWD
1.0000 | Freq: Once | ORAL | Status: AC
  Administered 2015-06-28: 255 g via ORAL

## 2015-06-28 MED ORDER — DIPHENHYDRAMINE HCL 50 MG/ML IJ SOLN
INTRAMUSCULAR | Status: AC
  Filled 2015-06-28: qty 1

## 2015-06-28 MED ORDER — FENTANYL CITRATE (PF) 100 MCG/2ML IJ SOLN
INTRAMUSCULAR | Status: DC | PRN
  Administered 2015-06-28 (×2): 25 ug via INTRAVENOUS

## 2015-06-28 MED ORDER — MIDAZOLAM HCL 5 MG/ML IJ SOLN
INTRAMUSCULAR | Status: AC
  Filled 2015-06-28: qty 2

## 2015-06-28 MED ORDER — POLYETHYLENE GLYCOL 3350 17 GM/SCOOP PO POWD: 1.0000 | ORAL | Status: DC

## 2015-06-28 MED ORDER — POLYETHYLENE GLYCOL 3350 17 GM/SCOOP PO POWD: 1.0000 | Freq: Once | ORAL | Status: DC

## 2015-06-28 MED ORDER — SODIUM CHLORIDE 0.9 % IV SOLN: Freq: Once | INTRAVENOUS | Status: DC

## 2015-06-28 MED ORDER — SODIUM CHLORIDE 0.9 % IV SOLN: INTRAVENOUS | Status: DC

## 2015-06-28 MED ORDER — MIDAZOLAM HCL 10 MG/2ML IJ SOLN
INTRAMUSCULAR | Status: DC | PRN
  Administered 2015-06-28: 1 mg via INTRAVENOUS
  Administered 2015-06-28 (×2): 2 mg via INTRAVENOUS

## 2015-06-28 MED ORDER — CARVEDILOL 6.25 MG PO TABS
6.2500 mg | ORAL_TABLET | Freq: Two times a day (BID) | ORAL | Status: DC
  Administered 2015-06-28 – 2015-06-29 (×2): 6.25 mg via ORAL
  Filled 2015-06-28 (×4): qty 1

## 2015-06-28 MED ORDER — POLYETHYLENE GLYCOL 3350 17 GM/SCOOP PO POWD
1.0000 | Freq: Once | ORAL | Status: AC
  Administered 2015-06-28: 255 g via ORAL
  Filled 2015-06-28: qty 255

## 2015-06-28 MED ORDER — FENTANYL CITRATE (PF) 100 MCG/2ML IJ SOLN
INTRAMUSCULAR | Status: AC
  Filled 2015-06-28: qty 2

## 2015-06-28 MED ORDER — POLYETHYLENE GLYCOL 3350 17 GM/SCOOP PO POWD
1.0000 | ORAL | Status: DC
  Filled 2015-06-28: qty 255

## 2015-06-28 MED ORDER — PERFLUTREN LIPID MICROSPHERE
1.0000 mL | INTRAVENOUS | Status: DC | PRN
  Filled 2015-06-28: qty 10

## 2015-06-28 NOTE — Progress Notes (Signed)
Patient IV began leaking while receiving a unit of blood immediately stopped blood and attempted to access new IV site was unsuccessful.  A second nurse attempted and was also unsuccessful at placing a new IV an order for IV therapy was placed.  IV therapy was successful at placing a new IV the unit of blood was on hold for approximately an hour.  Contacted Marina Goodell MD to request permission to increase the rate of blood administration in order for the unit of blood to complete within the four hour time frame.  Received a verbal order to increase the rate to no more than 224ml/hr.  Rate has been increased at this time will continue to monitor patient.

## 2015-06-28 NOTE — Op Note (Signed)
[facility] [place], [address]   COLONOSCOPY PROCEDURE REPORT     EXAM DATE: 30-Jun-2015  PATIENT NAME:      Jennifer Foster, Jennifer Foster           MR #:      [id number]  BIRTHDATE:       [birth year]      VISIT #:     [phone]  ATTENDING:     Clarene Essex, MD     STATUS:     inpatient ASSISTANT:      Elspeth Cho and Jeb Levering  INDICATIONS:  The patient is a 69 yr old female here for a colonoscopy due to hematochezia and anemia, non-specific. PROCEDURE PERFORMED:     Colonoscopy, diagnostic MEDICATIONS:     Fentanyl 50 mcg IV and Versed 5 mg IV ESTIMATED BLOOD LOSS:     None  CONSENT: The patient understands the risks and benefits of the procedure and understands that these risks include, but are not limited to: sedation, allergic reaction, infection, perforation and/or bleeding. Alternative means of evaluation and treatment include, among others: physical exam, x-rays, and/or surgical intervention. The patient elects to proceed with this endoscopic procedure.  DESCRIPTION OF PROCEDURE: During intra-op preparation period all mechanical & medical equipment was checked for proper function. Hand hygiene and appropriate measures for infection prevention was taken. After the risks, benefits and alternatives of the procedure were thoroughly explained, Informed consent was verified, confirmed and timeout was successfully executed by the treatment team. A digital exam revealed no abnormalities of the rectum. The Pentax Colonoscope [id number] endoscope was introduced through the anus and advanced to the terminal ileum which was intubated for a short distance. the prep was adequate The instrument was then slowly withdrawn as the colon was fully examined.Estimated blood loss is zero unless otherwise noted in this procedure report. The findings are recorded below      Retroflexed views revealed internal Grade I hemorrhoids. The scope was then  completely withdrawn from the patient and the procedure terminated. SCOPE WITHDRAWAL TIME: see nurse's note    ADVERSE EVENTS:      There were no immediate complications.  IMPRESSIONS:     1. Small internal hemorrhoids 2. Scattered left-sided diverticuli primarily descending rare right side no signs of active bleeding 3. Otherwise within normal limits to the terminal ileum  RECOMMENDATIONS:     slowly advance diet if no further bleeding in a day or 2 hopefully home soon but cautioned regarding aspirin and nonsteroidals and happy to see back when necessary RECALL:     as needed  _____________________________ Clarene Essex, MD eSigned:  Clarene Essex, MD Jun 30, 2015 3:32 PM   cc:   CPT CODES: ICD CODES:  The ICD and CPT codes recommended by this software are interpretations from the data that the clinical staff has captured with the software.  The verification of the translation of this report to the ICD and CPT codes and modifiers is the sole responsibility of the health care institution and practicing physician where this report was generated.  [employer] will not be held responsible for the validity of the ICD and CPT codes included on this report.  AMA assumes no liability for data contained or not contained herein. CPT is a Designer, television/film set of the [employer].   PATIENT NAME:  Jennifer Foster, Jennifer Foster MR#: [id number]

## 2015-06-28 NOTE — Care Management Important Message (Signed)
Important Message  Patient Details  Name: Jennifer Foster MRN: [id number] Date of Birth: [birth year]   Medicare Important Message Given:  Yes-second notification given    Nathen May 06/28/2015, 10:37 AM

## 2015-06-28 NOTE — Progress Notes (Signed)
CRITICAL VALUE ALERT  Critical value received:  Hgb 5.8  Date of notification:  06/28/15  Time of notification:  [id number]  Critical value read back:Yes.    Nurse who received alert:  Haskell Riling  MD notified (1st page):  MD Haney  Time of first page:  [phone]  MD notified (2nd page):  Time of second page:  Responding MD:  MD Lincoln Brigham  Time MD responded:  [phone]

## 2015-06-28 NOTE — Progress Notes (Signed)
Family Medicine Teaching Service Daily Progress Note Intern Pager: [phone]  Patient name: Jennifer Foster Medical record number: [id number] Date of birth: [birth year] Age: 69 y.o. Gender: female  Primary Care Provider: Andrena Mews, MD Consultants: GI Code Status: full  Pt Overview and Major Events to Date:  10/10-admitted with rectal bleeding  Assessment and Plan: Jennifer Foster is a 69 y.o. female presenting with GI bleeding . PMH is significant for diverticular bleed, hypertension, CKD-3, hyperlipidemia, gout, depression, GAD and anemia.  Rectal bleeding: 3-2 episodes of rectal bleeding overnight with hgb this AM to 5.8. VS wnl. GI angio neg for acute bleed, however nuclear study showed acive bleeding from left descending colon concerning for diverticular bleed - 2 u pRBCS - repeat CBC this afternoon - Per GI--> miralax bowel prep in anticipation of lower GI scope today - maintain bilateral IV access - D5 NS 100 cc/hr - Protonix IV 40 mg twice a day for potential upper GI source although this is unlikely given that patient is HDS. - Monitor vital signs and further bleeding  Anemia: Hgb 5.8 - to be transfused 2 u pRBCs, f/u repeat CBC this afternoon  CKD-3: BMP 1.3 on 10/25,  b/l Cr 1.3-1.6.  -f/u Cr today, BMP pending  HTN: BP stable wnl -weaning down BB to 6.25 BID to stop by day of dicharge  HLD: will continue home Crestor 20 mg  Gout: Stable now. States taking prednisone for recent flare which may have played role in her GI bleed. Recommned using colchicine instead of steroid and NSAID for gout flare. Recommend allopurinol and life style changes for flare prevention  Sub use: UDS +ve for cocaine.  - Will wean beta blocker, to be stopped as outpatient  Depression/GAD: Paxil 20 mg dialy on her chart. Hasn't taken this in about a month. - defer discussion on restarting as an outpatient  Tobacco use:  -consider discharging her on nicotine gums -Welbuterin could  help with this and depression as well.  FEN/GI:  -NPO -D5 NS 100 cc/hr  Prophylaxis: protonix, scds  Disposition: Pending clinical improvement  Subjective:  Reports dizziness while standing, but denies while sitting. Denies headache, chest pain, SOB, abd pain  Objective: Temp:  [98.1 F (36.7 C)-98.6 F (37 C)] 98.5 F (36.9 C) (10/27 0623) Pulse Rate:  [63-72] 63 (10/27 0550) Resp:  [13-23] 23 (10/27 0550) BP: (114-176)/(50-95) 138/61 mmHg (10/27 0623) SpO2:  [95 %-100 %] 95 % (10/27 0550) Physical Exam: Gen: NAD, lying in bed talking on phone CV: RRR.  Resp: CTAB, Abd: +BS. Soft, NDNT, no rebound or guarding.  Ext: no LE edema or tenderness Neuro: Alert and oriented, No gross focal deficits  Laboratory:  Recent Labs Lab 06/27/15 0454 06/27/15 1247 06/27/15 2327 06/28/15 0350  WBC 5.9 5.7 4.7  --   HGB 7.1* 7.6* 7.1* 5.8*  HCT 21.3* 22.5* 20.6* 17.4*  PLT 142* 154 131*  --     Recent Labs Lab 06/25/15 0143 06/25/15 1024 06/26/15 0535 06/26/15 1430  NA 140 145 140 143  K 3.5 3.1* 2.7* 3.9  CL 111 113* 110 115*  CO2 23 25 24 22   BUN 20 18 11 9   CREATININE 1.68* 1.59* 1.28* 1.35*  CALCIUM 8.4* 8.2* 8.0* 8.3*  PROT 6.0*  --   --   --   BILITOT 0.6  --   --   --   ALKPHOS 53  --   --   --   ALT 10*  --   --   --  AST 15  --   --   --   GLUCOSE 144* 101* 89 132*    Imaging/Diagnostic Tests: Nm Gi Blood Loss  06/27/2015  ADDENDUM REPORT: 06/27/2015 18:30 ADDENDUM: These results were called by telephone at the time of interpretation on 06/27/2015 at 6:10 pm to Dr. Lincoln Brigham , who verbally acknowledged these results. Electronically Signed   By: Jerilynn Mages.  Shick M.D.   On: 06/27/2015 18:30  06/27/2015  CLINICAL DATA:  Acute GI bleeding EXAM: NUCLEAR MEDICINE GASTROINTESTINAL BLEEDING SCAN TECHNIQUE: Sequential abdominal images were obtained following intravenous administration of Tc-56m labeled red blood cells. RADIOPHARMACEUTICALS:  Twenty-five mCi Tc-74m in-vitro  labeled red cells. COMPARISON:  None. FINDINGS: During the second hour of imaging at approximately 65 minutes, there is abnormal bowel activity demonstrated within peristalsing colon in the left abdomen which appears to originate in the descending colon. This is consistent with acute lower GI bleeding. IMPRESSION: Positive exam for acute lower GI bleeding originating in the left descending colon. Electronically Signed: By: Jerilynn Mages.  Shick M.D. On: 06/27/2015 17:48    Veatrice Bourbon, MD 06/28/2015, 7:35 AM PGY-2, [place] Intern pager: [phone], text pages welcome

## 2015-06-28 NOTE — Progress Notes (Signed)
Jennifer Foster 2:59 PM  Subjective: Patient with signs of continual bleeding and case discussed with the primary team and the patient is well and angiogram findings a nuclear medicine scan results noted  Objective: Vital signs stable afebrile no acute distress exam please see preassessment evaluation hemoglobin increased somewhat posttransfusion  Assessment: Diverticular bleeding  Plan: Okay to proceed with flexible sigmoidoscopy/colonoscopy with further workup and plans pending those findings  [facility] E  Pager [phone] After 5PM or if no answer call [phone]

## 2015-06-29 ENCOUNTER — Encounter (HOSPITAL_COMMUNITY): Payer: Self-pay | Admitting: Gastroenterology

## 2015-06-29 DIAGNOSIS — R195 Other fecal abnormalities: Secondary | ICD-10-CM

## 2015-06-29 DIAGNOSIS — I959 Hypotension, unspecified: Secondary | ICD-10-CM | POA: Insufficient documentation

## 2015-06-29 DIAGNOSIS — I9589 Other hypotension: Secondary | ICD-10-CM

## 2015-06-29 LAB — CBC
HCT: 23.9 % — ABNORMAL LOW (ref 36.0–46.0)
HEMATOCRIT: 22 % — AB (ref 36.0–46.0)
HEMOGLOBIN: 8.1 g/dL — AB (ref 12.0–15.0)
Hemoglobin: 7.5 g/dL — ABNORMAL LOW (ref 12.0–15.0)
MCH: 28.4 pg (ref 26.0–34.0)
MCH: 28.4 pg (ref 26.0–34.0)
MCHC: 34.1 g/dL (ref 30.0–36.0)
MCHC: 33.9 g/dL (ref 30.0–36.0)
MCV: 83.3 fL (ref 78.0–100.0)
MCV: 83.9 fL (ref 78.0–100.0)
Platelets: 116 10*3/uL — ABNORMAL LOW (ref 150–400)
Platelets: 123 10*3/uL — ABNORMAL LOW (ref 150–400)
RBC: 2.64 MIL/uL — ABNORMAL LOW (ref 3.87–5.11)
RBC: 2.85 MIL/uL — AB (ref 3.87–5.11)
RDW: 16.9 % — AB (ref 11.5–15.5)
RDW: 17 % — ABNORMAL HIGH (ref 11.5–15.5)
WBC: 5.8 10*3/uL (ref 4.0–10.5)
WBC: 6.2 10*3/uL (ref 4.0–10.5)

## 2015-06-29 LAB — TYPE AND SCREEN
ABO/RH(D): A POS
Antibody Screen: NEGATIVE
UNIT DIVISION: 0
Unit division: 0
Unit division: 0

## 2015-06-29 LAB — BASIC METABOLIC PANEL
ANION GAP: 5 (ref 5–15)
BUN: 8 mg/dL (ref 6–20)
CALCIUM: 7.7 mg/dL — AB (ref 8.9–10.3)
CO2: 22 mmol/L (ref 22–32)
Chloride: 115 mmol/L — ABNORMAL HIGH (ref 101–111)
Creatinine, Ser: 1.6 mg/dL — ABNORMAL HIGH (ref 0.44–1.00)
GFR, EST AFRICAN AMERICAN: 37 mL/min — AB (ref >60)
GFR, EST NON AFRICAN AMERICAN: 32 mL/min — AB (ref >60)
Glucose, Bld: 87 mg/dL (ref 65–99)
Potassium: 3.4 mmol/L — ABNORMAL LOW (ref 3.5–5.1)
Sodium: 142 mmol/L (ref 135–145)

## 2015-06-29 LAB — HEMOGLOBIN AND HEMATOCRIT, BLOOD
HEMATOCRIT: 24.9 % — AB (ref 36.0–46.0)
HEMOGLOBIN: 8.5 g/dL — AB (ref 12.0–15.0)

## 2015-06-29 LAB — ALBUMIN: ALBUMIN: 2.3 g/dL — AB (ref 3.5–5.0)

## 2015-06-29 MED ORDER — PANTOPRAZOLE SODIUM 40 MG PO TBEC
40.0000 mg | DELAYED_RELEASE_TABLET | Freq: Every day | ORAL | Status: DC
  Administered 2015-06-29 – 2015-06-30 (×2): 40 mg via ORAL
  Filled 2015-06-29 (×2): qty 1

## 2015-06-29 NOTE — Progress Notes (Signed)
Jennifer Foster 11:13 AM  Subjective: Patient did well from her colonoscopy which was discussed with her and her husband unfortunately just passed a very little bit of maroon stool without any other complaints  Objective: Vital signs stable afebrile no acute distress abdomen is soft nontender hemoglobin stable  Assessment: Diverticular bleeding stopped yesterday  Plan: Continue clear liquids and if bleeding increases consider either repeat angiogram per IR with coils in the most likely artery or repeat nuclear medicine bleeding scan to document same side of the colon  [facility] E  Pager [phone] After 5PM or if no answer call [phone]

## 2015-06-29 NOTE — Evaluation (Signed)
Physical Therapy Evaluation Patient Details Name: Jennifer Foster MRN: [id number] DOB: 1946-03-08 Today's Date: 06/29/2015   History of Present Illness  pt presents with GIB.  pt with hx of HTN, Gout, and polysubstance.    Clinical Impression  Pt moving well and without any deficits.  Pt indicates she is at her baseline other than moving a little slower than normal.  Pt will have good support from husband at D/C and no further PT needs at this time.  Will sign off.      Follow Up Recommendations No PT follow up    Equipment Recommendations  None recommended by PT    Recommendations for Other Services       Precautions / Restrictions Precautions Precautions: None Restrictions Weight Bearing Restrictions: No      Mobility  Bed Mobility Overal bed mobility: Modified Independent                Transfers Overall transfer level: Modified independent Equipment used: None                Ambulation/Gait Ambulation/Gait assistance: Modified independent (Device/Increase time) Ambulation Distance (Feet): 300 Feet Assistive device: None Gait Pattern/deviations: Step-through pattern;Decreased stride length     General Gait Details: pt indicates she's slower than normal, but no deficits noted.    Stairs            Wheelchair Mobility    Modified Rankin (Stroke Patients Only)       Balance Overall balance assessment: No apparent balance deficits (not formally assessed)                                           Pertinent Vitals/Pain Pain Assessment: No/denies pain    Home Living Family/patient expects to be discharged to:: Private residence Living Arrangements: Spouse/significant other Available Help at Discharge: Family;Available 24 hours/day Type of Home: House Home Access: Level entry     Home Layout: One level Home Equipment: None      Prior Function Level of Independence: Independent               Hand  Dominance        Extremity/Trunk Assessment   Upper Extremity Assessment: Overall WFL for tasks assessed           Lower Extremity Assessment: Overall WFL for tasks assessed      Cervical / Trunk Assessment: Normal  Communication   Communication: No difficulties  Cognition Arousal/Alertness: Awake/alert Behavior During Therapy: WFL for tasks assessed/performed Overall Cognitive Status: Within Functional Limits for tasks assessed                      General Comments      Exercises        Assessment/Plan    PT Assessment Patent does not need any further PT services  PT Diagnosis Difficulty walking   PT Problem List    PT Treatment Interventions     PT Goals (Current goals can be found in the Care Plan section) Acute Rehab PT Goals PT Goal Formulation: All assessment and education complete, DC therapy    Frequency     Barriers to discharge        Co-evaluation               End of Session   Activity Tolerance: Patient tolerated treatment well Patient  left: in chair;with call bell/phone within reach;with family/visitor present Nurse Communication: Mobility status         Time: [id number] PT Time Calculation (min) (ACUTE ONLY): 15 min   Charges:   PT Evaluation $Initial PT Evaluation Tier I: 1 Procedure     PT G CodesCatarina Hartshorn, Page 06/29/2015, 12:34 PM

## 2015-06-29 NOTE — Progress Notes (Signed)
Family Medicine Teaching Service Daily Progress Note Intern Pager: [phone]  Patient name: Jennifer Foster Medical record number: [id number] Date of birth: 11-13-1945 Age: 69 y.o. Gender: female  Primary Care Provider: Andrena Mews, MD Consultants: GI Code Status: full  Pt Overview and Major Events to Date:  10/10-admitted with rectal bleeding  Assessment and Plan: Jennifer Foster is a 69 y.o. female presenting with GI bleeding . PMH is significant for diverticular bleed, hypertension, CKD-3, hyperlipidemia, gout, depression, GAD and anemia.  Rectal bleeding: 2 episodes of rectal bleeding overnight. Nuclear study showed acive bleeding from left descending colon concerning for diverticular bleed. However, GI angio  And colonoscopy neg for acute bleed. Hgb 5.8 yesterday. Received 2 u pRBCs and Hgb came up to 7.5 and 8.1 this afternoon. Gi suggested keeping her overnight. - am CBC - maintain bilateral IV access - D/c IVF.  - Protonix PO 40 mg twice a day for potential upper GI source although this is unlikely given that patient is HDS. - Monitor vital signs and further bleeding  Anemia: Hgb 5.8>2u pRBCs>7.5>8.1 - am CBC  CKD-3: Cr 1.6.  b/l Cr 1.3-1.6.   HTN: low this morning. Discontinued coreg -continue home amlodipine 10 mg daily  HLD: will continue home Crestor 20 mg  Gout: Stable now. States taking prednisone for recent flare which may have played role in her GI bleed. Recommned using colchicine instead of steroid and NSAID for gout flare. Recommend allopurinol and life style changes for flare prevention  Sub use: UDS +ve for cocaine.  - stopped BB - Discussed the effect of cocaine. Patient voices understanding and agrees to stop  Depression/GAD: Paxil 20 mg dialy on her chart. Hasn't taken this in about a month. - defer discussion on restarting as an outpatient  Tobacco use:  -consider discharging her on nicotine gums (she prefers strawberry or cherry  flavor) -Welbuterin could help with this and depression as well.  FEN/GI:  -ADAT -d/c fluids -Protonix as above  Prophylaxis: protonix, scds  Disposition: step down. May be discharged tomorrow if no further bleeding or drop in hemoglobin  Subjective:  Denies dizziness, shortness of breath or chest pain. Reports two bloody bowel movements overnight. Denies abdominal pain  Objective: Temp:  [97.8 F (36.6 C)-99 F (37.2 C)] 98.6 F (37 C) (10/28 0348) Pulse Rate:  [65-75] 71 (10/28 0348) Resp:  [12-23] 18 (10/28 0348) BP: (107-163)/(47-114) 148/65 mmHg (10/28 0348) SpO2:  [94 %-100 %] 98 % (10/28 0348) Physical Exam: Gen: lying in bed, no acute distress, able to sit in bed for exam without difficulty or symptoms  Eyes: PERRLA, conjunctivae pale Nares: clear, no erythema, swelling or congestion Oropharynx: clear, moist CV: RRR. S1 & S2 audible, no murmurs. 2+ radial and DP pulses bilaterally. Resp: no apparent WOB, CTAB. Abd: +BS. Soft, NDNT, no rebound or guarding.  Ext: No edema or gross deformities. Neuro: Alert and oriented, No gross focal deficits  Laboratory:  Recent Labs Lab 06/27/15 1247 06/27/15 2327 06/28/15 0350 06/28/15 1755 06/29/15 0444  WBC 5.7 4.7  --   --  5.8  HGB 7.6* 7.1* 5.8* 8.5* 7.5*  HCT 22.5* 20.6* 17.4* 24.9* 22.0*  PLT 154 131*  --   --  116*    Recent Labs Lab 06/25/15 0143  06/26/15 1430 06/28/15 1755 06/29/15 0444  NA 140  < > 143 143 142  K 3.5  < > 3.9 3.7 3.4*  CL 111  < > 115* 117* 115*  CO2 23  < >  22 20* 22  BUN 20  < > 9 10 8   CREATININE 1.68*  < > 1.35* 1.72* 1.60*  CALCIUM 8.4*  < > 8.3* 8.2* 7.7*  PROT 6.0*  --   --   --   --   BILITOT 0.6  --   --   --   --   ALKPHOS 53  --   --   --   --   ALT 10*  --   --   --   --   AST 15  --   --   --   --   GLUCOSE 144*  < > 132* 100* 87  < > = values in this interval not displayed.  Imaging/Diagnostic Tests: No results found.  Mercy Riding, MD 06/29/2015, 7:23  AM PGY-1, [place] Intern pager: [phone], text pages welcome

## 2015-06-29 NOTE — Care Management Important Message (Signed)
Important Message  Patient Details  Name: Jennifer Foster MRN: [id number] Date of Birth: [birth year]   Medicare Important Message Given:  Yes-third notification given    Nathen May 06/29/2015, 11:49 AM

## 2015-06-29 NOTE — Progress Notes (Signed)
OT Cancellation Note  Patient Details Name: Jennifer Foster MRN: [id number] DOB: [birth year]   Cancelled Treatment:    Reason Eval/Treat Not Completed: OT screened, no needs identified, will sign off.  Pt reports she is at baseline and doesn't feel she needs OT.   Darlina Rumpf [place], OTR/L [id number]  06/29/2015, 5:52 PM

## 2015-06-30 LAB — CBC
HCT: 23 % — ABNORMAL LOW (ref 36.0–46.0)
HEMOGLOBIN: 7.9 g/dL — AB (ref 12.0–15.0)
MCH: 29 pg (ref 26.0–34.0)
MCHC: 34.3 g/dL (ref 30.0–36.0)
MCV: 84.6 fL (ref 78.0–100.0)
PLATELETS: 118 10*3/uL — AB (ref 150–400)
RBC: 2.72 MIL/uL — AB (ref 3.87–5.11)
RDW: 16.9 % — ABNORMAL HIGH (ref 11.5–15.5)
WBC: 5.3 10*3/uL (ref 4.0–10.5)

## 2015-06-30 LAB — BASIC METABOLIC PANEL
Anion gap: 9 (ref 5–15)
BUN: 5 mg/dL — ABNORMAL LOW (ref 6–20)
CALCIUM: 8.4 mg/dL — AB (ref 8.9–10.3)
CHLORIDE: 113 mmol/L — AB (ref 101–111)
CO2: 22 mmol/L (ref 22–32)
CREATININE: 1.45 mg/dL — AB (ref 0.44–1.00)
GFR calc non Af Amer: 36 mL/min — ABNORMAL LOW (ref >60)
GFR, EST AFRICAN AMERICAN: 42 mL/min — AB (ref >60)
GLUCOSE: 85 mg/dL (ref 65–99)
Potassium: 3.6 mmol/L (ref 3.5–5.1)
Sodium: 144 mmol/L (ref 135–145)

## 2015-06-30 MED ORDER — PANTOPRAZOLE SODIUM 40 MG PO TBEC: 40.0000 mg | DELAYED_RELEASE_TABLET | Freq: Every day | ORAL | Status: DC

## 2015-06-30 NOTE — Progress Notes (Signed)
Patient ID: Jennifer Foster, female   DOB: [birth year], 69 y.o.   MRN: [id number]   No further bleeding. Resolved diverticular bleed. Hgb 7.9 (8.1). Patient being discharged today by primary team. Nurse going over d/c information. F/U prn with GI.

## 2015-06-30 NOTE — Progress Notes (Signed)
Family Medicine Teaching Service Daily Progress Note Intern Pager: [phone]  Patient name: Jennifer Foster Medical record number: [id number] Date of birth: 1946/01/16 Age: 69 y.o. Gender: female  Primary Care Provider: Andrena Mews, MD Consultants: GI Code Status: full  Pt Overview and Major Events to Date:  10/24-admitted with rectal bleeding  Assessment and Plan: Jennifer Foster is a 69 y.o. female presenting with GI bleeding . PMH is significant for diverticular bleed, hypertension, CKD-3, hyperlipidemia, gout, depression, GAD and anemia.  Rectal bleeding 2/2 Diverticular bleed: No overnight episodes of bleeding. Vitals are stable and patient asymptomatic. Nuclear study showed acive bleeding from left descending colon. However, GI angio and colonoscopy neg for acute bleed. Patient has received total of 3u pRBCs. Hgb now stabilizing.  - maintain bilateral IV access; SLIV - Protonix PO 40 mg twice a day for potential upper GI source although this is unlikely - Monitor vital signs and further bleeding -trend CBC - Will repeat Tc-46m labeled RBC scan if slow drop in Hgb and mesenteric angiography with embolization if rapid drop in Hgb.  - Plan transfuse PRBC if Hgb < 7.0 or patient becomes symptomatic or BP persistently with MAP < 60 mmHg.  Anemia: Stabilizing. Baseline Hbg appears to be ~11.  -Hgb 5.8>2u pRBCs>7.5>8.1>7.9 - trend CBC  CKD-3: Baseline Cr 1.3-1.6.  -Cr today within her baseline at 1.45 -AKI resolved -continue to monitor  HTN: BP liable. Continue to monitor.  -continue home amlodipine 10 mg daily -Coreg held due to low BPs yesterday  HLD: continue home Crestor 20 mg  Sub use: UDS +ve for cocaine.  - stopped BB - Discussed the effect of cocaine. Patient voices understanding and agrees to stop  Tobacco use:  -consider discharging her on nicotine gums (she prefers strawberry or cherry flavor) -Welbuterin could help with this and depression as  well.  FEN/GI:  -regular diet -SLIV -Protonix as above  Prophylaxis: protonix, scds  Disposition: Discharge home.  Subjective:  Doing well. Denies any further bleeding. Denies lightheadedness, dizziness, shortness of breath, abdominal pain, or chest pain.   Objective: Temp:  [98 F (36.7 C)-98.9 F (37.2 C)] 98.6 F (37 C) (10/29 0800) Pulse Rate:  [63-78] 63 (10/29 0800) Resp:  [14-26] 24 (10/29 0800) BP: (101-152)/(52-67) 101/67 mmHg (10/29 0800) SpO2:  [97 %-99 %] 97 % (10/29 0800) Physical Exam: Gen: lying in bed, no acute distress  CV: RRR. S1 & S2 audible, no murmurs. Pulses intact Resp: normal WOB, CTAB.  Abd: +BS. Soft, NDNT, no rebound or guarding.  Ext: No edema or gross deformities. Neuro: Alert and oriented, No gross focal deficits  Laboratory:  Recent Labs Lab 06/29/15 0444 06/29/15 1005 06/30/15 0435  WBC 5.8 6.2 5.3  HGB 7.5* 8.1* 7.9*  HCT 22.0* 23.9* 23.0*  PLT 116* 123* 118*    Recent Labs Lab 06/25/15 0143  06/28/15 1755 06/29/15 0444 06/30/15 0435  NA 140  < > 143 142 144  K 3.5  < > 3.7 3.4* 3.6  CL 111  < > 117* 115* 113*  CO2 23  < > 20* 22 22  BUN 20  < > 10 8 5*  CREATININE 1.68*  < > 1.72* 1.60* 1.45*  CALCIUM 8.4*  < > 8.2* 7.7* 8.4*  PROT 6.0*  --   --   --   --   BILITOT 0.6  --   --   --   --   ALKPHOS 53  --   --   --   --  ALT 10*  --   --   --   --   AST 15  --   --   --   --   GLUCOSE 144*  < > 100* 87 85  < > = values in this interval not displayed.  Imaging/Diagnostic Tests: No results found.  Katheren Shams, DO 06/30/2015, 8:50 AM PGY-2, [facility] Intern pager: [phone], text pages welcome

## 2015-06-30 NOTE — Progress Notes (Signed)
Discharge instructions given and educated to patient and patient's husband. All questions answered. PIV discontinued, no signs of bleeding or infection. All belongings sent with patient. Discharged via wheelchair by Tilda Burrow, [place].

## 2015-07-02 ENCOUNTER — Other Ambulatory Visit (INDEPENDENT_AMBULATORY_CARE_PROVIDER_SITE_OTHER): Payer: Commercial Managed Care - HMO

## 2015-07-02 DIAGNOSIS — D649 Anemia, unspecified: Secondary | ICD-10-CM

## 2015-07-02 DIAGNOSIS — K922 Gastrointestinal hemorrhage, unspecified: Secondary | ICD-10-CM

## 2015-07-02 LAB — CBC
HEMATOCRIT: 26.7 % — AB (ref 36.0–46.0)
HEMOGLOBIN: 9 g/dL — AB (ref 12.0–15.0)
MCH: 28.7 pg (ref 26.0–34.0)
MCHC: 33.7 g/dL (ref 30.0–36.0)
MCV: 85 fL (ref 78.0–100.0)
Platelets: 177 10*3/uL (ref 150–400)
RBC: 3.14 MIL/uL — AB (ref 3.87–5.11)
RDW: 16.2 % — ABNORMAL HIGH (ref 11.5–15.5)
WBC: 5.8 10*3/uL (ref 4.0–10.5)

## 2015-07-02 LAB — POCT HEMOGLOBIN: HEMOGLOBIN: 8.9 g/dL — AB (ref 12.2–16.2)

## 2015-07-02 NOTE — Progress Notes (Signed)
STAT CBC was ordered and sent to [facility] this morning. Busick, Kevin Fenton

## 2015-07-12 ENCOUNTER — Encounter: Payer: Self-pay | Admitting: Student

## 2015-07-12 ENCOUNTER — Ambulatory Visit (INDEPENDENT_AMBULATORY_CARE_PROVIDER_SITE_OTHER): Payer: Commercial Managed Care - HMO | Admitting: Student

## 2015-07-12 VITALS — BP 138/66 | HR 72 | Temp 98.6°F | Wt 161.9 lb

## 2015-07-12 DIAGNOSIS — I10 Essential (primary) hypertension: Secondary | ICD-10-CM

## 2015-07-12 DIAGNOSIS — D649 Anemia, unspecified: Secondary | ICD-10-CM | POA: Diagnosis not present

## 2015-07-12 DIAGNOSIS — F17200 Nicotine dependence, unspecified, uncomplicated: Secondary | ICD-10-CM

## 2015-07-12 DIAGNOSIS — D500 Iron deficiency anemia secondary to blood loss (chronic): Secondary | ICD-10-CM | POA: Diagnosis not present

## 2015-07-12 DIAGNOSIS — K2970 Gastritis, unspecified, without bleeding: Secondary | ICD-10-CM

## 2015-07-12 DIAGNOSIS — K2990 Gastroduodenitis, unspecified, without bleeding: Secondary | ICD-10-CM

## 2015-07-12 DIAGNOSIS — F17210 Nicotine dependence, cigarettes, uncomplicated: Secondary | ICD-10-CM | POA: Diagnosis not present

## 2015-07-12 LAB — CBC
HCT: 29.2 % — ABNORMAL LOW (ref 36.0–46.0)
Hemoglobin: 10.2 g/dL — ABNORMAL LOW (ref 12.0–15.0)
MCH: 28.6 pg (ref 26.0–34.0)
MCHC: 34.9 g/dL (ref 30.0–36.0)
MCV: 81.8 fL (ref 78.0–100.0)
MPV: 9.4 fL (ref 8.6–12.4)
PLATELETS: 201 10*3/uL (ref 150–400)
RBC: 3.57 MIL/uL — AB (ref 3.87–5.11)
RDW: 15.7 % — AB (ref 11.5–15.5)
WBC: 7.2 10*3/uL (ref 4.0–10.5)

## 2015-07-12 MED ORDER — NICOTINE 7 MG/24HR TD PT24: 7.0000 mg | MEDICATED_PATCH | Freq: Every day | TRANSDERMAL | Status: DC

## 2015-07-12 NOTE — Assessment & Plan Note (Addendum)
Denies bleeding since discharge. Angiography and colonoscopy during recent hospitalization were negative for active bleeding. Tagged RBC positive for bleeding from left colon. Taking pantoprazole for gastritis. On Miralax for constipation.  -See plan for anemia -Avoid NSAID and steroid

## 2015-07-12 NOTE — Progress Notes (Signed)
   Subjective:    Patient ID: Jennifer Foster, female    DOB: November 27, 1945, 69 y.o.   MRN: [id number]  HPI Hospital follow up for anemia due to GI bleed: reports feeling well. Denies rectal bleeding since leaving hospital. Denies dizziness recently. Denies shortness of breath or chest pain. Has been taking geritol instead of iron. She wasn't sure if she got prescription for iron. Started getting her energy back. Denies constipation. Denies taking other over the counter pain medications or aspirin  Hypertension: stopped her coreg at her recent hospitalization out of concern for cocaine use and low heart rate. Her blood pressure is within normal range today.  Smoking: Smokes 2-3 cigarettes a day.  Discussed about quitting smoking. She is willing to stop. Gave her nicotine patches that she is willing to try.   Review of Systems Per HPI Objective:   Physical Exam Gen: well-appearing  CV: RRR. S1 & S2 audible, no murmurs. Abd: +BS. Soft, NDNT, no rebound or guarding.  Ext: No edema or gross deformities    Assessment & Plan:  ANEMIA  Denies rectal bleeding, dizziness, fatigue, shortness of breath or chest pain. Takes geritol OTC. Reports getting her energy back. -Will check CBC today -Advised to avoid OTC products including geritol -Will send a script for iron if H&H is low. Hgb 8 on discharge from hospital -Encouraged to eat vegetables, beans and diets rich in iron.  DIVERTICULAR BLEEDING, HX OF Denies bleeding since discharge. Angiography and colonoscopy during recent hospitalization were negative for active bleeding. Tagged RBC positive for bleeding from left colon. Taking pantoprazole for gastritis. On Miralax for constipation.  -See plan for anemia -Avoid NSAID and steroid  HYPERTENSION, BENIGN ESSENTIAL BP within normal range. Stopped Coreg during recent hospitalization. Stopped aspirin due to GI bleeding. -continue amlodipine now.  TOBACCO USER She smokes 2-3 cigarettes a day.  Discussed about the impacts of tobacco on her health, and the benefit of quitting. She is motivated to quit.  -Gave prescription for nicotine patches 7mg /24hrs.  -She has nicotine gums that she can use for craving as needed. -Will assess success at follow up

## 2015-07-12 NOTE — Patient Instructions (Signed)
It was great seeing you today! We have addressed the following issues today  1. Anemia: I will check your blood. I will call you if the result is not normal 2. Smoking: appreciate your decision to quit today. I am sending a prescription for nicotine patches.    If we did any lab work today, and the results require attention, either me or my nurse will get in touch with you. Otherwise, I look forward to talking with you again at our next visit. If you have any questions or concerns before then, please call the clinic at [phone].  Please bring all your medications to every doctors visit   Sign up for My Chart to have easy access to your labs results, and communication with your Primary care physician.    Please check-out at the front desk before leaving the clinic.   Take Care,

## 2015-07-12 NOTE — Assessment & Plan Note (Signed)
She smokes 2-3 cigarettes a day. Discussed about the impacts of tobacco on her health, and the benefit of quitting. She is motivated to quit.  -Gave prescription for nicotine patches 7mg /24hrs.  -She has nicotine gums that she can use for craving as needed. -Will assess success at follow up

## 2015-07-12 NOTE — Assessment & Plan Note (Signed)
BP within normal range. Stopped Coreg during recent hospitalization. Stopped aspirin due to GI bleeding. -continue amlodipine now.

## 2015-07-12 NOTE — Assessment & Plan Note (Deleted)
No further bleeding. Angiography and colonoscopy during recent hospitalization were negative for active bleeding. Tagged RBC positive for bleeding from left colon. Taking pantoprazole for gastritis. On Miralax for constipation. See plan for anemia

## 2015-07-12 NOTE — Assessment & Plan Note (Addendum)
Denies rectal bleeding, dizziness, fatigue, shortness of breath or chest pain. Takes geritol OTC. Reports getting her energy back. -Will check CBC today -Advised to avoid OTC products including geritol -Will send a script for iron if H&H is low. Hgb 8 on discharge from hospital -Encouraged to eat vegetables, beans and diets rich in iron.

## 2015-07-14 ENCOUNTER — Emergency Department (HOSPITAL_COMMUNITY): Payer: Commercial Managed Care - HMO

## 2015-07-14 ENCOUNTER — Encounter (HOSPITAL_COMMUNITY): Payer: Self-pay

## 2015-07-14 ENCOUNTER — Emergency Department (HOSPITAL_COMMUNITY)
Admission: EM | Admit: 2015-07-14 | Discharge: 2015-07-14 | Disposition: A | Discharge status: Home / Self Care | Payer: Commercial Managed Care - HMO | Attending: Emergency Medicine | Admitting: Emergency Medicine

## 2015-07-14 DIAGNOSIS — Z79899 Other long term (current) drug therapy: Secondary | ICD-10-CM | POA: Insufficient documentation

## 2015-07-14 DIAGNOSIS — E785 Hyperlipidemia, unspecified: Secondary | ICD-10-CM | POA: Diagnosis not present

## 2015-07-14 DIAGNOSIS — M79671 Pain in right foot: Secondary | ICD-10-CM

## 2015-07-14 DIAGNOSIS — Z8579 Personal history of other malignant neoplasms of lymphoid, hematopoietic and related tissues: Secondary | ICD-10-CM | POA: Diagnosis not present

## 2015-07-14 DIAGNOSIS — Z720 Tobacco use: Secondary | ICD-10-CM | POA: Insufficient documentation

## 2015-07-14 DIAGNOSIS — J45909 Unspecified asthma, uncomplicated: Secondary | ICD-10-CM | POA: Insufficient documentation

## 2015-07-14 DIAGNOSIS — I129 Hypertensive chronic kidney disease with stage 1 through stage 4 chronic kidney disease, or unspecified chronic kidney disease: Secondary | ICD-10-CM | POA: Diagnosis not present

## 2015-07-14 DIAGNOSIS — Z7982 Long term (current) use of aspirin: Secondary | ICD-10-CM | POA: Insufficient documentation

## 2015-07-14 DIAGNOSIS — M7989 Other specified soft tissue disorders: Secondary | ICD-10-CM | POA: Diagnosis not present

## 2015-07-14 DIAGNOSIS — N183 Chronic kidney disease, stage 3 (moderate): Secondary | ICD-10-CM | POA: Insufficient documentation

## 2015-07-14 DIAGNOSIS — D649 Anemia, unspecified: Secondary | ICD-10-CM | POA: Insufficient documentation

## 2015-07-14 DIAGNOSIS — K219 Gastro-esophageal reflux disease without esophagitis: Secondary | ICD-10-CM | POA: Insufficient documentation

## 2015-07-14 HISTORY — DX: Gout, unspecified: M10.9

## 2015-07-14 MED ORDER — METHOCARBAMOL 500 MG PO TABS: 500.0000 mg | ORAL_TABLET | Freq: Two times a day (BID) | ORAL | Status: DC

## 2015-07-14 MED ORDER — OXYCODONE-ACETAMINOPHEN 5-325 MG PO TABS
1.0000 | ORAL_TABLET | Freq: Once | ORAL | Status: AC
  Administered 2015-07-14: 1 via ORAL
  Filled 2015-07-14: qty 1

## 2015-07-14 NOTE — ED Notes (Signed)
She c/o non-traumatic pain and swelling of ant. Left foot for past several days.  This area is also mildly erythematous.

## 2015-07-14 NOTE — ED Notes (Signed)
Pt escorted to discharge window. Pt verbalized understanding discharge instructions. In no acute distress.  

## 2015-07-14 NOTE — Discharge Instructions (Signed)

## 2015-07-14 NOTE — ED Notes (Signed)
Pt ambulating to and from Chief of Staff.

## 2015-07-14 NOTE — ED Provider Notes (Signed)
CSN: [id number]     Arrival date & time 07/14/15  1125 History   First MD Initiated Contact with Patient 07/14/15 1157     Chief Complaint  Patient presents with  . Foot Pain     (Consider location/radiation/quality/duration/timing/severity/associated sxs/prior Treatment) HPI Comments: Patient here complaining of one month history of right foot pain and swelling. Denies any fever. No trauma. Pain characterized as sharp and worse with standing. Saw her physician for this several days ago in no diagnosis made. Denies any leg swelling. No shortness of breath. Symptoms better with rest. Denies any other joint involvement  Patient is a 69 y.o. female presenting with lower extremity pain. The history is provided by the patient.  Foot Pain    Past Medical History  Diagnosis Date  . Hypertension   . Joint pain   . GERD (gastroesophageal reflux disease)   . Hyperlipemia   . History of asthma   . History of anemia   . Lipoma   . Asthma   . Acute blood loss anemia 06/26/2015  . Chronic kidney disease (CKD), stage III (moderate) 06/27/2015  . Gout    Past Surgical History  Procedure Laterality Date  . Abdominal hysterectomy  1981    partial, per pt history  . Lipoma excision  01/28/11    neck  . Colonoscopy N/A 12/08/2013    Procedure: COLONOSCOPY;  Surgeon: [name], MD;  Location: [facility];  Service: Endoscopy;  Laterality: N/A;  . Esophagogastroduodenoscopy N/A 12/08/2013    Procedure: ESOPHAGOGASTRODUODENOSCOPY (EGD);  Surgeon: [name], MD;  Location: [facility] ENDOSCOPY;  Service: Endoscopy;  Laterality: N/A;  . Givens capsule study N/A 12/08/2013    Procedure: GIVENS CAPSULE STUDY;  Surgeon: [name], MD;  Location: [facility];  Service: Endoscopy;  Laterality: N/A;  . Colonoscopy N/A 06/28/2015    Procedure: COLONOSCOPY;  Surgeon: Clarene Essex, MD;  Location: [facility] ENDOSCOPY;  Service: Endoscopy;  Laterality: N/A;   Family History  Problem Relation Age of Onset  .  Diabetes type II    . Kidney failure    . Kidney failure Son    Social History  Substance Use Topics  . Smoking status: Current Some Day Smoker -- 0.25 packs/day    Types: Cigarettes  . Smokeless tobacco: Never Used  . Alcohol Use: 0.0 oz/week    0 Standard drinks or equivalent per week     Comment: occasionally   OB History    No data available     Review of Systems  All other systems reviewed and are negative.     Allergies  Ace inhibitors; Lisinopril; and Tramadol  Home Medications   Prior to Admission medications   Medication Sig Start Date End Date Taking? Authorizing Provider  Acetaminophen (TYLENOL ARTHRITIS EXT RELIEF PO) Take 1-2 tablets by mouth every 8 (eight) hours as needed (for pain).   Yes Historical Provider, MD  albuterol (PROVENTIL HFA;VENTOLIN HFA) 108 (90 BASE) MCG/ACT inhaler Inhale 2 puffs into the lungs every 6 (six) hours as needed for wheezing or shortness of breath.   Yes Historical Provider, MD  amLODipine (NORVASC) 10 MG tablet Take 1 tablet (10 mg total) by mouth daily. 02/20/15  Yes [name], MD  ferrous sulfate 325 (65 FE) MG tablet Take 325 mg by mouth daily with breakfast.   Yes Historical Provider, MD  nicotine (EQ NICOTINE) 7 mg/24hr patch Place 1 patch (7 mg total) onto the skin daily. 07/12/15  Yes Mercy Riding,  MD  pantoprazole (PROTONIX) 40 MG tablet Take 1 tablet (40 mg total) by mouth daily. 06/30/15  Yes Katheren Shams, DO  rosuvastatin (CRESTOR) 20 MG tablet Take 1 tablet (20 mg total) by mouth at bedtime. 02/20/15  Yes [name], MD  aspirin EC 81 MG EC tablet Take 1 tablet (81 mg total) by mouth daily. Patient not taking: Reported on 07/12/2015 10/15/14   Hilton Sinclair, MD   BP 143/110 mmHg  Pulse 89  Temp(Src) 98.1 F (36.7 C) (Oral)  Resp 16  SpO2 98% Physical Exam  Constitutional: She is oriented to person, place, and time. She appears well-developed and well-nourished.  Non-toxic appearance. No  distress.  HENT:  Head: Normocephalic and atraumatic.  Eyes: Conjunctivae, EOM and lids are normal. Pupils are equal, round, and reactive to light.  Neck: Normal range of motion. Neck supple. No tracheal deviation present. No thyroid mass present.  Cardiovascular: Normal rate, regular rhythm and normal heart sounds.  Exam reveals no gallop.   No murmur heard. Pulmonary/Chest: Effort normal and breath sounds normal. No stridor. No respiratory distress. She has no decreased breath sounds. She has no wheezes. She has no rhonchi. She has no rales.  Abdominal: Soft. Normal appearance and bowel sounds are normal. She exhibits no distension. There is no tenderness. There is no rebound and no CVA tenderness.  Musculoskeletal: Normal range of motion. She exhibits no edema or tenderness.       Feet:  No erythema appreciated. Dorsalis pedis pulse 2+. Neurovascular intact. Some edema noted to dorsum of foot  Neurological: She is alert and oriented to person, place, and time. She has normal strength. No cranial nerve deficit or sensory deficit. GCS eye subscore is 4. GCS verbal subscore is 5. GCS motor subscore is 6.  Skin: Skin is warm and dry. No abrasion and no rash noted.  Psychiatric: She has a normal mood and affect. Her speech is normal and behavior is normal.  Nursing note and vitals reviewed.   ED Course  Procedures (including critical care time) Labs Review Labs Reviewed - No data to display  Imaging Review No results found. I have personally reviewed and evaluated these images and lab results as part of my medical decision-making.   EKG Interpretation None      MDM   Final diagnoses:  None    Foot x-ray negative. Given Percocet for pain. Better. Was instructed to follow-up with her Dr.    Lacretia Leigh, MD 07/14/15 1420

## 2015-07-17 ENCOUNTER — Encounter: Payer: Self-pay | Admitting: Family Medicine

## 2015-07-17 ENCOUNTER — Ambulatory Visit (INDEPENDENT_AMBULATORY_CARE_PROVIDER_SITE_OTHER): Payer: Commercial Managed Care - HMO | Admitting: Family Medicine

## 2015-07-17 ENCOUNTER — Ambulatory Visit (HOSPITAL_COMMUNITY)
Admission: RE | Admit: 2015-07-17 | Discharge: 2015-07-17 | Disposition: A | Discharge status: Home / Self Care | Payer: Commercial Managed Care - HMO | Source: Ambulatory Visit | Attending: Family Medicine | Admitting: Family Medicine

## 2015-07-17 VITALS — BP 128/78 | HR 78 | Temp 97.9°F | Ht 64.0 in | Wt 168.3 lb

## 2015-07-17 DIAGNOSIS — M79671 Pain in right foot: Secondary | ICD-10-CM

## 2015-07-17 DIAGNOSIS — M25474 Effusion, right foot: Secondary | ICD-10-CM

## 2015-07-17 DIAGNOSIS — M7989 Other specified soft tissue disorders: Secondary | ICD-10-CM | POA: Diagnosis not present

## 2015-07-17 DIAGNOSIS — M79604 Pain in right leg: Secondary | ICD-10-CM | POA: Insufficient documentation

## 2015-07-17 MED ORDER — PREDNISONE 20 MG PO TABS: 40.0000 mg | ORAL_TABLET | Freq: Every day | ORAL | Status: DC

## 2015-07-17 NOTE — Patient Instructions (Signed)
Gout °Gout is when your joints become red, sore, and swell (inflamed). This is caused by the buildup of uric acid crystals in the joints. Uric acid is a chemical that is normally in the blood. If the level of uric acid gets too high in the blood, these crystals form in your joints and tissues. Over time, these crystals can form into masses near the joints and tissues. These masses can destroy bone and cause the bone to look misshapen (deformed). °HOME CARE  °· Do not take aspirin for pain. °· Only take medicine as told by your doctor. °· Rest the joint as much as you can. When in bed, keep sheets and blankets off painful areas. °· Keep the sore joints raised (elevated). °· Put warm or cold packs on painful joints. Use of warm or cold packs depends on which works best for you. °· Use crutches if the painful joint is in your leg. °· Drink enough fluids to keep your pee (urine) clear or pale yellow. Limit alcohol, sugary drinks, and drinks with fructose in them. °· Follow your diet instructions. Pay careful attention to how much protein you eat. Include fruits, vegetables, whole grains, and fat-free or low-fat milk products in your daily diet. Talk to your doctor or dietitian about the use of coffee, vitamin C, and cherries. These may help lower uric acid levels. °· Keep a healthy body weight. °GET HELP RIGHT AWAY IF:  °· You have watery poop (diarrhea), throw up (vomit), or have any side effects from medicines. °· You do not feel better in 24 hours, or you are getting worse. °· Your joint becomes suddenly more tender, and you have chills or a fever. °MAKE SURE YOU:  °· Understand these instructions. °· Will watch your condition. °· Will get help right away if you are not doing well or get worse. °  °This information is not intended to replace advice given to you by your health care provider. Make sure you discuss any questions you have with your health care provider. °  °Document Released: 05/27/2008 Document Revised:  09/08/2014 Document Reviewed: 03/31/2012 °Elsevier Interactive Patient Education ©2016 [employer] ° °

## 2015-07-17 NOTE — Progress Notes (Signed)
VASCULAR LAB PRELIMINARY  PRELIMINARY  PRELIMINARY  PRELIMINARY  Right lower extremity venous duplex completed.    Preliminary report:  Right:  No evidence of DVT, superficial thrombosis, or Baker's cyst.  Samona Chihuahua, RVT 07/17/2015, 1:19 PM

## 2015-07-17 NOTE — Assessment & Plan Note (Signed)
Typical of her gouty attacked. However, I worry about mild erythema on the dorsum of her foot. This does not look infected but concerning for DVT. I sent her to radiology for duplex U/S to r/o DVT. Patient given Prednisone to treat gout. Colchicine not ideal based her her kidney function, although the dose may be adjusted. If no improvement on steroid and duplex is neg for DVT, I will try her on Colchicine for a short duration. She will benefit from Uloric once her flare up has resolved. F/U with me in 1 wk.

## 2015-07-17 NOTE — Progress Notes (Signed)
Subjective:     Patient ID: Jennifer Foster, female   DOB: 1946/04/01, 69 y.o.   MRN: [id number]  HPI Foot: Right foot 2-3 wks ago, gradually worsening about 10/10 in severity, recently went to the ED and was sent home on pain medicine. Pain is unbearable. No injury. She is not taking anything for pain, she has allergy to Tramadol.   Current Outpatient Prescriptions on File Prior to Visit  Medication Sig Dispense Refill  . albuterol (PROVENTIL HFA;VENTOLIN HFA) 108 (90 BASE) MCG/ACT inhaler Inhale 2 puffs into the lungs every 6 (six) hours as needed for wheezing or shortness of breath.    [name] amLODipine (NORVASC) 10 MG tablet Take 1 tablet (10 mg total) by mouth daily. 90 tablet 1  . pantoprazole (PROTONIX) 40 MG tablet Take 1 tablet (40 mg total) by mouth daily. 30 tablet 0  . rosuvastatin (CRESTOR) 20 MG tablet Take 1 tablet (20 mg total) by mouth at bedtime. 90 tablet 1  . Acetaminophen (TYLENOL ARTHRITIS EXT RELIEF PO) Take 1-2 tablets by mouth every 8 (eight) hours as needed (for pain).    [name] aspirin EC 81 MG EC tablet Take 1 tablet (81 mg total) by mouth daily. (Patient not taking: Reported on 07/12/2015) 30 tablet 1  . ferrous sulfate 325 (65 FE) MG tablet Take 325 mg by mouth daily with breakfast.    . methocarbamol (ROBAXIN) 500 MG tablet Take 1 tablet (500 mg total) by mouth 2 (two) times daily. 20 tablet 0  . nicotine (EQ NICOTINE) 7 mg/24hr patch Place 1 patch (7 mg total) onto the skin daily. 28 patch 0   No current facility-administered medications on file prior to visit.   Past Medical History  Diagnosis Date  . Hypertension   . Joint pain   . GERD (gastroesophageal reflux disease)   . Hyperlipemia   . History of asthma   . History of anemia   . Lipoma   . Asthma   . Acute blood loss anemia 06/26/2015  . Chronic kidney disease (CKD), stage III (moderate) 06/27/2015  . Gout      Review of Systems  Respiratory: Negative.   Cardiovascular: Negative.   Musculoskeletal:  Positive for joint swelling and arthralgias.       Foot pain and swelling  All other systems reviewed and are negative.      Filed Vitals:   07/17/15 1057  BP: 131/95  Pulse: 102  Temp: 97.9 F (36.6 C)  TempSrc: Oral  Height: 5\' 4"  (1.626 m)  Weight: 168 lb 5 oz (76.346 kg)    Objective:   Physical Exam  Constitutional: She appears well-developed. No distress.  Cardiovascular: Normal rate, regular rhythm and normal heart sounds.   No murmur heard. Pulmonary/Chest: Effort normal and breath sounds normal. No respiratory distress. She has no wheezes. She exhibits no tenderness.  Abdominal: Soft. Bowel sounds are normal. She exhibits no distension.  Musculoskeletal:       Feet:  Nursing note and vitals reviewed.      Assessment:     Right foot swelling     Plan:     Check problem list.

## 2015-07-23 ENCOUNTER — Other Ambulatory Visit: Payer: Self-pay

## 2015-07-23 DIAGNOSIS — Z1231 Encounter for screening mammogram for malignant neoplasm of breast: Secondary | ICD-10-CM

## 2015-07-24 ENCOUNTER — Ambulatory Visit (INDEPENDENT_AMBULATORY_CARE_PROVIDER_SITE_OTHER): Payer: Commercial Managed Care - HMO | Admitting: Family Medicine

## 2015-07-24 ENCOUNTER — Encounter: Payer: Self-pay | Admitting: Family Medicine

## 2015-07-24 ENCOUNTER — Telehealth: Payer: Self-pay | Admitting: *Deleted

## 2015-07-24 VITALS — BP 179/96 | HR 79 | Temp 98.5°F | Ht 64.0 in | Wt 173.6 lb

## 2015-07-24 DIAGNOSIS — M10079 Idiopathic gout, unspecified ankle and foot: Secondary | ICD-10-CM

## 2015-07-24 DIAGNOSIS — F1410 Cocaine abuse, uncomplicated: Secondary | ICD-10-CM | POA: Diagnosis not present

## 2015-07-24 DIAGNOSIS — I10 Essential (primary) hypertension: Secondary | ICD-10-CM

## 2015-07-24 DIAGNOSIS — F411 Generalized anxiety disorder: Secondary | ICD-10-CM | POA: Diagnosis not present

## 2015-07-24 DIAGNOSIS — M109 Gout, unspecified: Secondary | ICD-10-CM

## 2015-07-24 MED ORDER — HYDRALAZINE HCL 10 MG PO TABS: 10.0000 mg | ORAL_TABLET | Freq: Two times a day (BID) | ORAL | Status: DC

## 2015-07-24 MED ORDER — HYDRALAZINE HCL 10 MG PO TABS
10.0000 mg | ORAL_TABLET | Freq: Three times a day (TID) | ORAL | Status: DC
Start: 2015-07-24 — End: 2015-07-24

## 2015-07-24 NOTE — Assessment & Plan Note (Signed)
BP elevated today. She has had poor control in the past, but she was also recently placed on steroid which could have made it worse. Previously she was on Norvasc, Coreg, HCTZ and Lisinopril but now she is only on Norvasc 10 mg. Coreg d/c because of cocaine addiction. HCTZ and Lisinopril d/c for renal insufficiency. Plan to continue Norvasc. I will start her on hydralazine 10 mg BID since she has had poor control in the past. Instruction given to check BP regularly at home and if less than 100/60 to hold hydralazine. F/u in 2 wks for reassessment.

## 2015-07-24 NOTE — Assessment & Plan Note (Signed)
Patient had been on meds in the past but non-compliance. She is self-controlling herself for now. I recommended counseling by our behavioral health specialist today but she declined. She requested letter for her grandson to be moved close by based on her health status. I recommended evaluation by a psychiatrist to help with determining the need for this letter.  She agreed to be seen by one.  I will try to get her in with our psychiatrist.

## 2015-07-24 NOTE — Telephone Encounter (Signed)
-----   Message from [name], MD sent at 07/24/2015  2:12 PM EST ----- Please help patient schedule follow-up with me in 2 wks. Thanks.

## 2015-07-24 NOTE — Telephone Encounter (Signed)
Patient scheduled for 08/07/15. Jazmin Hartsell,CMA

## 2015-07-24 NOTE — Patient Instructions (Addendum)
It was nice seeing you today. I am glad you feel better with your foot swelling and pain. Please complete the steroid medication and return to me in 2 wks for reassessment. Your BP is elevated today. Let us start you on Hydralazine BID for now.Hold BP medication if it is running less than 110/60. I will like to see you back in 2 wks.   How to Take Your Blood Pressure HOW DO I GET A BLOOD PRESSURE MACHINE?  You can buy an electronic home blood pressure machine at your local pharmacy. Insurance will sometimes cover the cost if you have a prescription.  Ask your doctor what type of machine is best for you. There are different machines for your arm and your wrist.  If you decide to buy a machine to check your blood pressure on your arm, first check the size of your arm so you can buy the right size cuff. To check the size of your arm:   Use a measuring tape that shows both inches and centimeters.   Wrap the measuring tape around the upper-middle part of your arm. You may need someone to help you measure.   Write down your arm measurement in both inches and centimeters.   To measure your blood pressure correctly, it is important to have the right size cuff.   If your arm is up to 13 inches (up to 34 centimeters), get an adult cuff size.  If your arm is 13 to 17 inches (35 to 44 centimeters), get a large adult cuff size.    If your arm is 17 to 20 inches (45 to 52 centimeters), get an adult thigh cuff.  WHAT DO THE NUMBERS MEAN?   There are two numbers that make up your blood pressure. For example: 120/80.  The first number (120 in our example) is called the "systolic pressure." It is a measure of the pressure in your blood vessels when your heart is pumping blood.  The second number (80 in our example) is called the "diastolic pressure." It is a measure of the pressure in your blood vessels when your heart is resting between beats.  Your doctor will tell you what your blood  pressure should be. WHAT SHOULD I DO BEFORE I CHECK MY BLOOD PRESSURE?   Try to rest or relax for at least 30 minutes before you check your blood pressure.  Do not smoke.  Do not have any drinks with caffeine, such as:  Soda.  Coffee.  Tea.  Check your blood pressure in a quiet room.  Sit down and stretch out your arm on a table. Keep your arm at about the level of your heart. Let your arm relax.  Make sure that your legs are not crossed. HOW DO I CHECK MY BLOOD PRESSURE?  Follow the directions that came with your machine.  Make sure you remove any tight-fitting clothing from your arm or wrist. Wrap the cuff around your upper arm or wrist. You should be able to fit a finger between the cuff and your arm. If you cannot fit a finger between the cuff and your arm, it is too tight and should be removed and rewrapped.  Some units require you to manually pump up the arm cuff.  Automatic units inflate the cuff when you press a button.  Cuff deflation is automatic in both models.  After the cuff is inflated, the unit measures your blood pressure and pulse. The readings are shown on a monitor. Hold still  and breathe normally while the cuff is inflated.  Getting a reading takes less than a minute.  Some models store readings in a memory. Some provide a printout of readings. If your machine does not store your readings, keep a written record.  Take readings with you to your next visit with your doctor.   This information is not intended to replace advice given to you by your health care provider. Make sure you discuss any questions you have with your health care provider.   Document Released: 07/31/2008 Document Revised: 09/08/2014 Document Reviewed: 10/13/2013 Elsevier Interactive Patient Education [employer].

## 2015-07-24 NOTE — Assessment & Plan Note (Signed)
Counseling done today. She seems serious about quitting. I will continue to work with her.

## 2015-07-24 NOTE — Progress Notes (Signed)
Subjective:     Patient ID: Jennifer Foster, female   DOB: 08-07-1946, 69 y.o.   MRN: [id number]  HPI HTN: She is compliant with Norvasc 10 mg qd, last dose was this morning. She does not check her BP regularly at home. Ankle pain/Gout:Here for follow-up with her right foot pain. She is compliant with prednisone and felt it was very helpful. Her pain and swelling has resolved. She has few more doses of Prednisone to complete. GAD: Patient here for follow-up. She is not on medication, she prefers self-control. Patient stated she is stressed more lately worrying about her grandson Seward Grater) who has been in [facility] in [place] for the past. He has 3 more years to get released. She usually visits him in jail at least every other week when he was close by, but since he was moved to [place] has not been able to visit him. She is unable to travel far due to anxiety and other health problem. Her husband also has anxiety and health problems. Both unable to drive far. She is requesting a letter for her to take to the county jail to request transfer back to a close by jailing facility. 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 She stated she had been clean for years till last month when she fell back into her addiction. She stated she has since then made up her mind never to go back on it.   Current Outpatient Prescriptions on File Prior to Visit  Medication Sig Dispense Refill  . amLODipine (NORVASC) 10 MG tablet Take 1 tablet (10 mg total) by mouth daily. 90 tablet 1  . ferrous sulfate 325 (65 FE) MG tablet Take 325 mg by mouth daily with breakfast.    . methocarbamol (ROBAXIN) 500 MG tablet Take 1 tablet (500 mg total) by mouth 2 (two) times daily. 20 tablet 0  . pantoprazole (PROTONIX) 40 MG tablet Take 1 tablet (40 mg total) by mouth daily. 30 tablet 0  . predniSONE (DELTASONE) 20 MG tablet Take 2 tablets (40 mg total) by mouth daily with breakfast. 20 tablet 0  .  rosuvastatin (CRESTOR) 20 MG tablet Take 1 tablet (20 mg total) by mouth at bedtime. 90 tablet 1  . Acetaminophen (TYLENOL ARTHRITIS EXT RELIEF PO) Take 1-2 tablets by mouth every 8 (eight) hours as needed (for pain).    [name] albuterol (PROVENTIL HFA;VENTOLIN HFA) 108 (90 BASE) MCG/ACT inhaler Inhale 2 puffs into the lungs every 6 (six) hours as needed for wheezing or shortness of breath.    [name] aspirin EC 81 MG EC tablet Take 1 tablet (81 mg total) by mouth daily. (Patient not taking: Reported on 07/12/2015) 30 tablet 1  . nicotine (EQ NICOTINE) 7 mg/24hr patch Place 1 patch (7 mg total) onto the skin daily. (Patient not taking: Reported on 07/24/2015) 28 patch 0   No current facility-administered medications on file prior to visit.   Past Medical History  Diagnosis Date  . Hypertension   . Joint pain   . GERD (gastroesophageal reflux disease)   . Hyperlipemia   . History of asthma   . History of anemia   . Lipoma   . Asthma   . Acute blood loss anemia 06/26/2015  . Chronic kidney disease (CKD), stage III (moderate) 06/27/2015  . Gout      Review of Systems  Respiratory: Negative.   Cardiovascular: Negative.   Gastrointestinal: Negative.   Musculoskeletal: Negative.   Psychiatric/Behavioral: The patient is nervous/anxious.  All other systems reviewed and are negative.  Filed Vitals:   07/24/15 0858 07/24/15 0928 07/24/15 0930 07/24/15 0931  BP: 160/75 181/81 165/78 179/96  Pulse: 79 80 76 79  Temp: 98.5 F (36.9 C)     TempSrc: Oral     Height: 5\' 4"  (1.626 m)     Weight: 173 lb 9 oz (78.727 kg)           Objective:   Physical Exam  Constitutional: She appears well-developed. No distress.  Cardiovascular: Normal rate, regular rhythm and normal heart sounds.   No murmur heard. Pulmonary/Chest: Effort normal and breath sounds normal. No respiratory distress. She has no wheezes. She exhibits no tenderness.  Abdominal: Soft. Bowel sounds are normal. She exhibits no  distension and no mass. There is no tenderness.  Musculoskeletal: Normal range of motion. She exhibits no edema or tenderness.  Psychiatric: Her speech is normal and behavior is normal. Judgment and thought content normal. Her mood appears anxious. Cognition and memory are normal. She does not exhibit a depressed mood.  Nursing note and vitals reviewed.      Assessment:     HTN Gout:   Generalized anxiety disorder Cocaine    Plan:     Check problem list.

## 2015-07-24 NOTE — Assessment & Plan Note (Addendum)
Right foot pain and swelling resolved. Plan to start on Uloric, but will defer till she is completely off her steroid. F/U in 2 wks to discuss new medication.

## 2015-07-25 ENCOUNTER — Other Ambulatory Visit: Payer: Self-pay | Admitting: Family Medicine

## 2015-07-25 ENCOUNTER — Telehealth: Payer: Self-pay | Admitting: Family Medicine

## 2015-07-25 NOTE — Telephone Encounter (Signed)
Please have patient schedule nurse visit next week for BP check and medication review. Please advise she come in with all her medications.

## 2015-07-25 NOTE — Telephone Encounter (Signed)
Will forward to MD to make her aware.  Patient has an appt to see pcp in December.  Jazmin Hartsell,CMA

## 2015-07-25 NOTE — Telephone Encounter (Signed)
Patient scheduled on 07/31/15 with nurse for bp check and review medications.  She agreed to bring all her medications so we can verify with what we have in the computer. Jazmin Hartsell,CMA

## 2015-07-25 NOTE — Telephone Encounter (Signed)
Thank you :)

## 2015-07-25 NOTE — Telephone Encounter (Signed)
Jennifer Foster calling from [employer], wanted to report that pt had difficulty explaining BP care for herself. Anne reiterated the BP parameters as well as medication instructions and even asked patient to write these down. Although patient wrote them down, she wrote them down and repeated back incorrect information. Pt may be having trouble getting the care she needs as she does not know what she is supposed to do. Thank you, Fonda Kinder, ASA

## 2015-07-25 NOTE — Telephone Encounter (Signed)
Webb Silversmith calling from [employer], wanted to report that pt had difficulty explaining BP care for herself. Anne reiterated the BP parameters as well as medication instructions and even asked patient to write these down. Although patient wrote them down, she wrote them down and repeated back incorrect information. Pt may be having trouble getting the care she needs as she does not know what she is supposed to do. Thank you, Fonda Kinder, ASA

## 2015-07-31 ENCOUNTER — Ambulatory Visit (INDEPENDENT_AMBULATORY_CARE_PROVIDER_SITE_OTHER): Payer: Commercial Managed Care - HMO | Admitting: *Deleted

## 2015-07-31 VITALS — BP 170/80

## 2015-07-31 DIAGNOSIS — I10 Essential (primary) hypertension: Secondary | ICD-10-CM

## 2015-07-31 DIAGNOSIS — Z0130 Encounter for examination of blood pressure without abnormal findings: Secondary | ICD-10-CM

## 2015-07-31 DIAGNOSIS — Z136 Encounter for screening for cardiovascular disorders: Secondary | ICD-10-CM

## 2015-07-31 NOTE — Progress Notes (Signed)
    Patient presents for BP check after receiving call from [facility] that patient was having difficulty stating how to take BP meds Med list reviewed; states taking all meds as directed Discussed need for low sodium diet and using Mrs. Dash as alternative to salt Patient states she walks once or twice weekly to end of street and back  Filed Vitals:   07/31/15 0851  BP: 170/80    Patient has appt with PCP on 08/07/15  Velora Heckler, RN

## 2015-08-07 ENCOUNTER — Ambulatory Visit (INDEPENDENT_AMBULATORY_CARE_PROVIDER_SITE_OTHER): Payer: Commercial Managed Care - HMO | Admitting: Family Medicine

## 2015-08-07 ENCOUNTER — Encounter: Payer: Self-pay | Admitting: Family Medicine

## 2015-08-07 VITALS — BP 149/90 | HR 75 | Temp 98.2°F | Ht 64.0 in | Wt 173.6 lb

## 2015-08-07 DIAGNOSIS — E785 Hyperlipidemia, unspecified: Secondary | ICD-10-CM

## 2015-08-07 DIAGNOSIS — M109 Gout, unspecified: Secondary | ICD-10-CM

## 2015-08-07 DIAGNOSIS — N183 Chronic kidney disease, stage 3 unspecified: Secondary | ICD-10-CM

## 2015-08-07 DIAGNOSIS — M10079 Idiopathic gout, unspecified ankle and foot: Secondary | ICD-10-CM

## 2015-08-07 DIAGNOSIS — I10 Essential (primary) hypertension: Secondary | ICD-10-CM

## 2015-08-07 NOTE — Assessment & Plan Note (Signed)
Counseled on BP controlled. Avoidance of nephrotoxic agent. Cr level has been stable. Monitor for now.

## 2015-08-07 NOTE — Progress Notes (Signed)
Subjective:     Patient ID: Jennifer Foster, female   DOB: 26-Sep-1945, 69 y.o.   MRN: [id number]  HPI HTN: Patient is compliant with Norvasc 10 mg qd and Hydralazine 10 mg BID. She has not been checking her BP at home. Denies any S/E to her medications. [id number] claims compliance with her Crestor although she did not come with her medication bottle today. CKD III: Patient denies any GU symptoms, she stated her sister is on dialysis, mom was on dialysis. She has a cousin on dialysis and her son died on dialysis as well. She does not ever want to get on dialysis. Gout: denies any foot pain or swelling. She is now off prednisone.   Current Outpatient Prescriptions on File Prior to Visit  Medication Sig Dispense Refill  . Acetaminophen (TYLENOL ARTHRITIS EXT RELIEF PO) Take 1-2 tablets by mouth every 8 (eight) hours as needed (for pain).    [name] albuterol (PROVENTIL HFA;VENTOLIN HFA) 108 (90 BASE) MCG/ACT inhaler Inhale 2 puffs into the lungs every 6 (six) hours as needed for wheezing or shortness of breath.    [name] amLODipine (NORVASC) 10 MG tablet Take 1 tablet (10 mg total) by mouth daily. 90 tablet 1  . aspirin EC 81 MG EC tablet Take 1 tablet (81 mg total) by mouth daily. (Patient not taking: Reported on 07/12/2015) 30 tablet 1  . ferrous sulfate 325 (65 FE) MG tablet Take 325 mg by mouth daily with breakfast.    . hydrALAZINE (APRESOLINE) 10 MG tablet Take 1 tablet (10 mg total) by mouth 2 (two) times daily. 60 tablet 1  . methocarbamol (ROBAXIN) 500 MG tablet Take 1 tablet (500 mg total) by mouth 2 (two) times daily. 20 tablet 0  . nicotine (EQ NICOTINE) 7 mg/24hr patch Place 1 patch (7 mg total) onto the skin daily. (Patient not taking: Reported on 07/24/2015) 28 patch 0  . pantoprazole (PROTONIX) 40 MG tablet Take 1 tablet (40 mg total) by mouth daily. 30 tablet 0  . predniSONE (DELTASONE) 20 MG tablet Take 2 tablets (40 mg total) by mouth daily with breakfast. 20 tablet 0  . rosuvastatin  (CRESTOR) 20 MG tablet Take 1 tablet (20 mg total) by mouth at bedtime. 90 tablet 1   No current facility-administered medications on file prior to visit.   Past Medical History  Diagnosis Date  . Hypertension   . Joint pain   . GERD (gastroesophageal reflux disease)   . Hyperlipemia   . History of asthma   . History of anemia   . Lipoma   . Asthma   . Acute blood loss anemia 06/26/2015  . Chronic kidney disease (CKD), stage III (moderate) 06/27/2015  . Gout       Review of Systems  Respiratory: Negative.   Cardiovascular: Negative.   Gastrointestinal: Negative.   Genitourinary: Negative.   Musculoskeletal: Negative.   All other systems reviewed and are negative.      Filed Vitals:   08/07/15 0846 08/07/15 0859 08/07/15 0901 08/07/15 0903  BP: 161/85 170/66 152/71 149/90  Pulse: 79 70 73 75  Temp: 98.2 F (36.8 C)     TempSrc: Oral     Height: 5\' 4"  (1.626 m)     Weight: 173 lb 9 oz (78.727 kg)       Objective:   Physical Exam  Constitutional: She is oriented to person, place, and time. She appears well-developed. No distress.  Cardiovascular: Normal rate, regular rhythm, normal heart sounds and intact  distal pulses.   No murmur heard. Pulmonary/Chest: Effort normal and breath sounds normal. No respiratory distress. She has no wheezes. She exhibits no tenderness.  Abdominal: Soft. Bowel sounds are normal. She exhibits no distension and no mass. There is no tenderness.  Musculoskeletal: Normal range of motion. She exhibits no edema.  Neurological: She is alert and oriented to person, place, and time. No cranial nerve deficit.  Nursing note and vitals reviewed.      Assessment:     HTN: HLD: CKD III:  Gout:    Plan:     Check problem list

## 2015-08-07 NOTE — Assessment & Plan Note (Signed)
Stable on Crestor. Continue current dose.

## 2015-08-07 NOTE — Patient Instructions (Signed)
DASH Eating Plan  DASH stands for "Dietary Approaches to Stop Hypertension." The DASH eating plan is a healthy eating plan that has been shown to reduce high blood pressure (hypertension). Additional health benefits may include reducing the risk of type 2 diabetes mellitus, heart disease, and stroke. The DASH eating plan may also help with weight loss.  WHAT DO I NEED TO KNOW ABOUT THE DASH EATING PLAN?  For the DASH eating plan, you will follow these general guidelines:  · Choose foods with a percent daily value for sodium of less than 5% (as listed on the food label).  · Use salt-free seasonings or herbs instead of table salt or sea salt.  · Check with your health care provider or pharmacist before using salt substitutes.  · Eat lower-sodium products, often labeled as "lower sodium" or "no salt added."  · Eat fresh foods.  · Eat more vegetables, fruits, and low-fat dairy products.  · Choose whole grains. Look for the word "whole" as the first word in the ingredient list.  · Choose fish and skinless chicken or turkey more often than red meat. Limit fish, poultry, and meat to 6 oz (170 g) each day.  · Limit sweets, desserts, sugars, and sugary drinks.  · Choose heart-healthy fats.  · Limit cheese to 1 oz (28 g) per day.  · Eat more home-cooked food and less restaurant, buffet, and fast food.  · Limit fried foods.  · Cook foods using methods other than frying.  · Limit canned vegetables. If you do use them, rinse them well to decrease the sodium.  · When eating at a restaurant, ask that your food be prepared with less salt, or no salt if possible.  WHAT FOODS CAN I EAT?  Seek help from a dietitian for individual calorie needs.  Grains  Whole grain or whole wheat bread. Brown rice. Whole grain or whole wheat pasta. Quinoa, bulgur, and whole grain cereals. Low-sodium cereals. Corn or whole wheat flour tortillas. Whole grain cornbread. Whole grain crackers. Low-sodium crackers.  Vegetables  Fresh or frozen vegetables  (raw, steamed, roasted, or grilled). Low-sodium or reduced-sodium tomato and vegetable juices. Low-sodium or reduced-sodium tomato sauce and paste. Low-sodium or reduced-sodium canned vegetables.   Fruits  All fresh, canned (in natural juice), or frozen fruits.  Meat and Other Protein Products  Ground beef (85% or leaner), grass-fed beef, or beef trimmed of fat. Skinless chicken or turkey. Ground chicken or turkey. Pork trimmed of fat. All fish and seafood. Eggs. Dried beans, peas, or lentils. Unsalted nuts and seeds. Unsalted canned beans.  Dairy  Low-fat dairy products, such as skim or 1% milk, 2% or reduced-fat cheeses, low-fat ricotta or cottage cheese, or plain low-fat yogurt. Low-sodium or reduced-sodium cheeses.  Fats and Oils  Tub margarines without trans fats. Light or reduced-fat mayonnaise and salad dressings (reduced sodium). Avocado. Safflower, olive, or canola oils. Natural peanut or almond butter.  Other  Unsalted popcorn and pretzels.  The items listed above may not be a complete list of recommended foods or beverages. Contact your dietitian for more options.  WHAT FOODS ARE NOT RECOMMENDED?  Grains  White bread. White pasta. White rice. Refined cornbread. Bagels and croissants. Crackers that contain trans fat.  Vegetables  Creamed or fried vegetables. Vegetables in a cheese sauce. Regular canned vegetables. Regular canned tomato sauce and paste. Regular tomato and vegetable juices.  Fruits  Dried fruits. Canned fruit in light or heavy syrup. Fruit juice.  Meat and Other Protein   Products  Fatty cuts of meat. Ribs, chicken wings, bacon, sausage, bologna, salami, chitterlings, fatback, hot dogs, bratwurst, and packaged luncheon meats. Salted nuts and seeds. Canned beans with salt.  Dairy  Whole or 2% milk, cream, half-and-half, and cream cheese. Whole-fat or sweetened yogurt. Full-fat cheeses or blue cheese. Nondairy creamers and whipped toppings. Processed cheese, cheese spreads, or cheese  curds.  Condiments  Onion and garlic salt, seasoned salt, table salt, and sea salt. Canned and packaged gravies. Worcestershire sauce. Tartar sauce. Barbecue sauce. Teriyaki sauce. Soy sauce, including reduced sodium. Steak sauce. Fish sauce. Oyster sauce. Cocktail sauce. Horseradish. Ketchup and mustard. Meat flavorings and tenderizers. Bouillon cubes. Hot sauce. Tabasco sauce. Marinades. Taco seasonings. Relishes.  Fats and Oils  Butter, stick margarine, lard, shortening, ghee, and bacon fat. Coconut, palm kernel, or palm oils. Regular salad dressings.  Other  Pickles and olives. Salted popcorn and pretzels.  The items listed above may not be a complete list of foods and beverages to avoid. Contact your dietitian for more information.  WHERE CAN I FIND MORE INFORMATION?  National Heart, Lung, and Blood Institute: www.nhlbi.nih.gov/health/health-topics/topics/dash/     This information is not intended to replace advice given to you by your health care provider. Make sure you discuss any questions you have with your health care provider.     Document Released: 08/07/2011 Document Revised: 09/08/2014 Document Reviewed: 06/22/2013  Elsevier Interactive Patient Education ©2016 [employer] Inc[employer]

## 2015-08-07 NOTE — Assessment & Plan Note (Signed)
Plan to start on Uloric but deferring due to kidney function, although we can give renal dose. Patient will like to consider medication if she has another gout flare. Monitor for now.

## 2015-08-07 NOTE — Assessment & Plan Note (Signed)
Her BP was initially elevated today. Repeated BP improved some. Her BP goal should be between 120/70 to 150/80. Plan to continue current BP regimen. I instructed her to check her BP at home regularly and document it. She does have BP machine at home. Patient was also counseled on salt free diet as well as exercise. F/U in 3 wks for reassessment.

## 2015-08-28 ENCOUNTER — Ambulatory Visit
Admission: RE | Admit: 2015-08-28 | Discharge: 2015-08-28 | Disposition: A | Discharge status: Home / Self Care | Payer: Commercial Managed Care - HMO | Source: Ambulatory Visit

## 2015-08-28 DIAGNOSIS — Z1231 Encounter for screening mammogram for malignant neoplasm of breast: Secondary | ICD-10-CM | POA: Diagnosis not present

## 2015-08-29 ENCOUNTER — Encounter: Payer: Self-pay | Admitting: Family Medicine

## 2015-09-07 ENCOUNTER — Other Ambulatory Visit: Payer: Self-pay | Admitting: Family Medicine

## 2015-09-07 MED ORDER — AMLODIPINE BESYLATE 10 MG PO TABS: 10.0000 mg | ORAL_TABLET | Freq: Every day | ORAL | Status: DC

## 2015-09-07 MED ORDER — PANTOPRAZOLE SODIUM 40 MG PO TBEC: 40.0000 mg | DELAYED_RELEASE_TABLET | Freq: Every day | ORAL | Status: DC

## 2015-09-07 NOTE — Telephone Encounter (Signed)
Pt needs a refill on amLODipine (NORVASC) 10 MG tablet and pantoprazole (PROTONIX) 40 MG tablet. Sent to [facility] on [address]. Sadie Reynolds, ASA

## 2015-09-14 ENCOUNTER — Encounter: Payer: Self-pay | Admitting: Family Medicine

## 2015-09-14 ENCOUNTER — Ambulatory Visit (INDEPENDENT_AMBULATORY_CARE_PROVIDER_SITE_OTHER): Payer: Commercial Managed Care - HMO | Admitting: Family Medicine

## 2015-09-14 VITALS — BP 177/76 | HR 68 | Temp 97.9°F | Ht 64.0 in | Wt 172.0 lb

## 2015-09-14 DIAGNOSIS — R350 Frequency of micturition: Secondary | ICD-10-CM | POA: Diagnosis not present

## 2015-09-14 DIAGNOSIS — F411 Generalized anxiety disorder: Secondary | ICD-10-CM | POA: Diagnosis not present

## 2015-09-14 DIAGNOSIS — I10 Essential (primary) hypertension: Secondary | ICD-10-CM

## 2015-09-14 DIAGNOSIS — R809 Proteinuria, unspecified: Secondary | ICD-10-CM

## 2015-09-14 LAB — POCT URINALYSIS DIPSTICK
Bilirubin, UA: NEGATIVE
Blood, UA: NEGATIVE
Glucose, UA: NEGATIVE
Ketones, UA: NEGATIVE
Leukocytes, UA: NEGATIVE
Nitrite, UA: NEGATIVE
Protein, UA: >=300
Spec Grav, UA: 1.025
Urobilinogen, UA: 0.2
pH, UA: 7

## 2015-09-14 LAB — POCT UA - MICROSCOPIC ONLY

## 2015-09-14 NOTE — Assessment & Plan Note (Signed)
Might be due to excessive fluid intake during winter. Urinalysis done today shows large proteinuria. ?? Related to her chronic kidney disease. Patient called and I discussed UA report with her. I advised f/u next week for repeat UA. If positive protein, I will consider 24 hr urine protein testing vs referral to nephrologist given hx of CKD III. Patient agreed with follow up plan.

## 2015-09-14 NOTE — Assessment & Plan Note (Signed)
Stable. Not on meds. Patient had been on meds in the past but does not currently want to be on meds. She has an up coming appointment with psych to further address and manage her anxiety with depression.  She is non-suicidal or homicidal.

## 2015-09-14 NOTE — Progress Notes (Signed)
Subjective:     Patient ID: Jennifer Foster, female   DOB: Oct 22, 1945, 70 y.o.   MRN: [id number]  HPI [id number] is here for follow up. She is compliant with Norvasc 10 mg qd and hydralazine 10 mg TID. Last dose of her medication was this morning. She stated her home BP check has been normal, she however could not remember the numbers and she did not bring her note book. Denies any other concern. Depression/Anxiety: Patient stated she got a call and received appointment letter from the psychiatrist's office. Denies any change in her mood, not suicidal. Feel well without medication. She stated she will go for her appointment to establish care. Urine frequency:C/O increase urine frequency during the day, not so much at night. This has been on going for months but worsened in the last few weeks. She has been drinking a lot of water at home. Denies excessive thirst. Denies change in urine color, no dysuria, no flank pain.  Current Outpatient Prescriptions on File Prior to Visit  Medication Sig Dispense Refill  . amLODipine (NORVASC) 10 MG tablet Take 1 tablet (10 mg total) by mouth daily. 90 tablet 1  . hydrALAZINE (APRESOLINE) 10 MG tablet Take 1 tablet (10 mg total) by mouth 2 (two) times daily. 60 tablet 1  . pantoprazole (PROTONIX) 40 MG tablet Take 1 tablet (40 mg total) by mouth daily. 90 tablet 1  . rosuvastatin (CRESTOR) 20 MG tablet Take 1 tablet (20 mg total) by mouth at bedtime. 90 tablet 1  . Acetaminophen (TYLENOL ARTHRITIS EXT RELIEF PO) Take 1-2 tablets by mouth every 8 (eight) hours as needed (for pain).    [name] albuterol (PROVENTIL HFA;VENTOLIN HFA) 108 (90 BASE) MCG/ACT inhaler Inhale 2 puffs into the lungs every 6 (six) hours as needed for wheezing or shortness of breath.    [name] aspirin EC 81 MG EC tablet Take 1 tablet (81 mg total) by mouth daily. 30 tablet 1  . ferrous sulfate 325 (65 FE) MG tablet Take 325 mg by mouth daily with breakfast.    . Multiple Vitamins-Calcium (ONE-A-DAY  WOMENS FORMULA PO) Take 1 tablet by mouth daily.    . nicotine (EQ NICOTINE) 7 mg/24hr patch Place 1 patch (7 mg total) onto the skin daily. (Patient not taking: Reported on 07/24/2015) 28 patch 0   No current facility-administered medications on file prior to visit.   Past Medical History  Diagnosis Date  . Hypertension   . Joint pain   . GERD (gastroesophageal reflux disease)   . Hyperlipemia   . History of asthma   . History of anemia   . Lipoma   . Asthma   . Acute blood loss anemia 06/26/2015  . Chronic kidney disease (CKD), stage III (moderate) 06/27/2015  . Gout      Review of Systems  Respiratory: Negative.   Cardiovascular: Negative.   Genitourinary: Positive for frequency. Negative for dysuria, hematuria, flank pain, enuresis and difficulty urinating.  All other systems reviewed and are negative.      Filed Vitals:   09/14/15 0948 09/14/15 0959  BP: 182/73 177/76  Pulse: 71 68  Temp: 97.9 F (36.6 C)   TempSrc: Oral   Height: 5\' 4"  (1.626 m)   Weight: 172 lb (78.019 kg)     Objective:   Physical Exam  Constitutional: She is oriented to person, place, and time. She appears well-developed. No distress.  Cardiovascular: Normal rate, regular rhythm, normal heart sounds and intact distal pulses.   No  murmur heard. Pulmonary/Chest: Effort normal and breath sounds normal. No respiratory distress. She has no wheezes.  Abdominal: Soft. Bowel sounds are normal. She exhibits no distension and no mass. There is no tenderness.  Musculoskeletal: Normal range of motion. She exhibits no edema.  Neurological: She is alert and oriented to person, place, and time.  Psychiatric: She has a normal mood and affect. Her behavior is normal. Judgment and thought content normal. Thought content is not paranoid and not delusional. Cognition and memory are normal. She expresses no homicidal and no suicidal ideation. She expresses no suicidal plans and no homicidal plans.  Nursing note  and vitals reviewed.      Assessment:     HTN: Depression/Anxiety: Urine frequency    Plan:     Check problem list

## 2015-09-14 NOTE — Assessment & Plan Note (Signed)
BP still not well controlled despite compliance with her med. Plan to continue Norvasc 10 mg qd. Increase Hydralazine 10 mg to TID from BID. Continue home BP check. F/U with me in 4 wks for reassessment.

## 2015-09-14 NOTE — Patient Instructions (Signed)
Your BP is elevated today. Please increase your hydralazine 10 mg to 1 tablet 3 times daily instead of two times daily and continue other BP medications. Continue home BP check. And I will see you back in 4 wks.

## 2015-09-17 ENCOUNTER — Other Ambulatory Visit: Payer: Commercial Managed Care - HMO

## 2015-09-24 ENCOUNTER — Other Ambulatory Visit: Payer: Commercial Managed Care - HMO

## 2015-09-25 ENCOUNTER — Ambulatory Visit (HOSPITAL_COMMUNITY): Payer: Commercial Managed Care - HMO | Admitting: Psychiatry

## 2015-10-04 ENCOUNTER — Other Ambulatory Visit: Payer: Self-pay | Admitting: Family Medicine

## 2015-10-04 MED ORDER — HYDRALAZINE HCL 10 MG PO TABS: 10.0000 mg | ORAL_TABLET | Freq: Three times a day (TID) | ORAL | Status: DC

## 2015-10-04 MED ORDER — HYDRALAZINE HCL 10 MG PO TABS: 10.0000 mg | ORAL_TABLET | Freq: Two times a day (BID) | ORAL | Status: DC

## 2015-10-04 NOTE — Telephone Encounter (Signed)
Needs refill on hydralazine SP? [facility] on [address]

## 2015-10-04 NOTE — Telephone Encounter (Signed)
I contacted patient's pharmacy, I was informed she picked up Hydralazine 10 mg BID instead of TID. I asked the pharmacy to canceled the previous hydralazine 10 mg BID order and keep the TID order. I called patient to remind her that she should be taking hydralazine 10 mg TID instead of BID she verbalized understanding.

## 2015-11-08 ENCOUNTER — Ambulatory Visit (HOSPITAL_COMMUNITY): Payer: Commercial Managed Care - HMO | Admitting: Psychiatry

## 2015-12-12 ENCOUNTER — Other Ambulatory Visit: Payer: Self-pay | Admitting: Family Medicine

## 2015-12-12 MED ORDER — HYDRALAZINE HCL 10 MG PO TABS: 10.0000 mg | ORAL_TABLET | Freq: Three times a day (TID) | ORAL | Status: DC

## 2015-12-12 NOTE — Telephone Encounter (Signed)
Spoke with patient and confirmed that she needed her hydralizine. Jennifer Foster,CMA

## 2015-12-12 NOTE — Telephone Encounter (Signed)
Need to get refill for her hctz.

## 2015-12-18 ENCOUNTER — Ambulatory Visit: Payer: Commercial Managed Care - HMO | Admitting: Family Medicine

## 2015-12-25 ENCOUNTER — Encounter: Payer: Self-pay | Admitting: Family Medicine

## 2015-12-25 ENCOUNTER — Ambulatory Visit (INDEPENDENT_AMBULATORY_CARE_PROVIDER_SITE_OTHER): Payer: Commercial Managed Care - HMO | Admitting: Family Medicine

## 2015-12-25 VITALS — BP 158/90 | HR 75 | Temp 98.2°F | Ht 64.0 in | Wt 157.0 lb

## 2015-12-25 DIAGNOSIS — I10 Essential (primary) hypertension: Secondary | ICD-10-CM | POA: Diagnosis not present

## 2015-12-25 DIAGNOSIS — F418 Other specified anxiety disorders: Secondary | ICD-10-CM

## 2015-12-25 DIAGNOSIS — F419 Anxiety disorder, unspecified: Secondary | ICD-10-CM

## 2015-12-25 DIAGNOSIS — F329 Major depressive disorder, single episode, unspecified: Secondary | ICD-10-CM | POA: Insufficient documentation

## 2015-12-25 DIAGNOSIS — R809 Proteinuria, unspecified: Secondary | ICD-10-CM

## 2015-12-25 MED ORDER — HYDRALAZINE HCL 25 MG PO TABS: 25.0000 mg | ORAL_TABLET | Freq: Two times a day (BID) | ORAL | Status: DC

## 2015-12-25 MED ORDER — ROSUVASTATIN CALCIUM 20 MG PO TABS: 20.0000 mg | ORAL_TABLET | Freq: Every day | ORAL | Status: DC

## 2015-12-25 NOTE — Assessment & Plan Note (Signed)
BP slightly elevated. Plan to d/c Hydralazine 10 mg TID. I started her on Hydralazine 25 mg BID. Continue home BP monitoring. Continue Norvasc. F/U in 2wks for reassessment. She verbalized understanding.
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
## 2015-12-25 NOTE — Patient Instructions (Signed)
It was nice seeing you today. You BP improved a lot from the last time, but still elevated a bit. I will switch you from Hydralazine 10 mg TID to 25 mg twice daily. Please see me back in 2 wks.
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
## 2016-01-11 ENCOUNTER — Ambulatory Visit: Payer: Commercial Managed Care - HMO | Admitting: Family Medicine

## 2016-01-11 DIAGNOSIS — R809 Proteinuria, unspecified: Secondary | ICD-10-CM | POA: Insufficient documentation

## 2016-01-11 HISTORY — DX: Proteinuria, unspecified: R80.9

## 2016-01-11 NOTE — Assessment & Plan Note (Signed)
UA ordered to recheck. At the end of the visit she was unable to give her urine. She agreed to return for UA check.

## 2016-03-06 ENCOUNTER — Other Ambulatory Visit: Payer: Self-pay | Admitting: *Deleted

## 2016-03-06 MED ORDER — AMLODIPINE BESYLATE 10 MG PO TABS: 10.0000 mg | ORAL_TABLET | Freq: Every day | ORAL | Status: DC

## 2016-03-14 ENCOUNTER — Ambulatory Visit: Payer: Commercial Managed Care - HMO | Admitting: Family Medicine

## 2016-03-14 ENCOUNTER — Encounter: Payer: Self-pay | Admitting: Family Medicine

## 2016-03-18 ENCOUNTER — Ambulatory Visit (INDEPENDENT_AMBULATORY_CARE_PROVIDER_SITE_OTHER): Payer: Commercial Managed Care - HMO | Admitting: Family Medicine

## 2016-03-18 ENCOUNTER — Telehealth: Payer: Self-pay | Admitting: *Deleted

## 2016-03-18 ENCOUNTER — Encounter: Payer: Self-pay | Admitting: Family Medicine

## 2016-03-18 VITALS — BP 150/68 | HR 69 | Temp 98.6°F | Wt 160.0 lb

## 2016-03-18 DIAGNOSIS — I10 Essential (primary) hypertension: Secondary | ICD-10-CM

## 2016-03-18 DIAGNOSIS — R809 Proteinuria, unspecified: Secondary | ICD-10-CM

## 2016-03-18 DIAGNOSIS — R0602 Shortness of breath: Secondary | ICD-10-CM | POA: Diagnosis not present

## 2016-03-18 DIAGNOSIS — F418 Other specified anxiety disorders: Secondary | ICD-10-CM

## 2016-03-18 DIAGNOSIS — F329 Major depressive disorder, single episode, unspecified: Secondary | ICD-10-CM

## 2016-03-18 DIAGNOSIS — N183 Chronic kidney disease, stage 3 unspecified: Secondary | ICD-10-CM

## 2016-03-18 DIAGNOSIS — Z1159 Encounter for screening for other viral diseases: Secondary | ICD-10-CM

## 2016-03-18 DIAGNOSIS — F419 Anxiety disorder, unspecified: Secondary | ICD-10-CM

## 2016-03-18 DIAGNOSIS — F32A Depression, unspecified: Secondary | ICD-10-CM

## 2016-03-18 DIAGNOSIS — E785 Hyperlipidemia, unspecified: Secondary | ICD-10-CM

## 2016-03-18 DIAGNOSIS — F17200 Nicotine dependence, unspecified, uncomplicated: Secondary | ICD-10-CM

## 2016-03-18 LAB — POCT UA - MICROSCOPIC ONLY

## 2016-03-18 LAB — POCT URINALYSIS DIPSTICK
Glucose, UA: NEGATIVE
Leukocytes, UA: NEGATIVE
NITRITE UA: NEGATIVE
PH UA: 5.5
RBC UA: NEGATIVE
SPEC GRAV UA: 1.025
UROBILINOGEN UA: 0.2

## 2016-03-18 MED ORDER — ESCITALOPRAM OXALATE 10 MG PO TABS: 10.0000 mg | ORAL_TABLET | Freq: Every day | ORAL | Status: DC

## 2016-03-18 NOTE — Assessment & Plan Note (Signed)
Tobacco cessation counseling done. She stated she still has nicotine gum at home and she will use that. Declined referral to smoking cessation clinic.

## 2016-03-18 NOTE — Assessment & Plan Note (Signed)
Tobacco cessation counseling done. She stated she still has nicotine gum at home and she will use that. Declined referral to smoking cessation clinic. ECHO ordered to assess her LVEF. Last ECHO done in 2014 reviewed. Return precaution discussed.

## 2016-03-18 NOTE — Patient Instructions (Signed)
Generalized Anxiety Disorder Generalized anxiety disorder (GAD) is a mental disorder. It interferes with life functions, including relationships, work, and school. GAD is different from normal anxiety, which everyone experiences at some point in their lives in response to specific life events and activities. Normal anxiety actually helps us prepare for and get through these life events and activities. Normal anxiety goes away after the event or activity is over.  GAD causes anxiety that is not necessarily related to specific events or activities. It also causes excess anxiety in proportion to specific events or activities. The anxiety associated with GAD is also difficult to control. GAD can vary from mild to severe. People with severe GAD can have intense waves of anxiety with physical symptoms (panic attacks).  SYMPTOMS The anxiety and worry associated with GAD are difficult to control. This anxiety and worry are related to many life events and activities and also occur more days than not for 6 months or longer. People with GAD also have three or more of the following symptoms (one or more in children):  Restlessness.   Fatigue.  Difficulty concentrating.   Irritability.  Muscle tension.  Difficulty sleeping or unsatisfying sleep. DIAGNOSIS GAD is diagnosed through an assessment by your health care provider. Your health care provider will ask you questions aboutyour mood,physical symptoms, and events in your life. Your health care provider may ask you about your medical history and use of alcohol or drugs, including prescription medicines. Your health care provider may also do a physical exam and blood tests. Certain medical conditions and the use of certain substances can cause symptoms similar to those associated with GAD. Your health care provider may refer you to a mental health specialist for further evaluation. TREATMENT The following therapies are usually used to treat GAD:    Medication. Antidepressant medication usually is prescribed for long-term daily control. Antianxiety medicines may be added in severe cases, especially when panic attacks occur.   Talk therapy (psychotherapy). Certain types of talk therapy can be helpful in treating GAD by providing support, education, and guidance. A form of talk therapy called cognitive behavioral therapy can teach you healthy ways to think about and react to daily life events and activities.  Stress managementtechniques. These include yoga, meditation, and exercise and can be very helpful when they are practiced regularly. A mental health specialist can help determine which treatment is best for you. Some people see improvement with one therapy. However, other people require a combination of therapies.   This information is not intended to replace advice given to you by your health care provider. Make sure you discuss any questions you have with your health care provider.   Document Released: 12/13/2012 Document Revised: 09/08/2014 Document Reviewed: 12/13/2012 Elsevier Interactive Patient Education 2016 [employer] Inc.  

## 2016-03-18 NOTE — Assessment & Plan Note (Signed)
BP ok. Continue current regimen. Continue home monitoring.

## 2016-03-18 NOTE — Assessment & Plan Note (Signed)
Persistent. Repeat UA today still positive for large protein. Likely related to her CKD. Will not start of ACEi or ARBS due to angioedema on ACEi. Will refer to nephrologist for further testing.

## 2016-03-18 NOTE — Assessment & Plan Note (Signed)
FLP checked today. I will contact her with result.

## 2016-03-18 NOTE — Assessment & Plan Note (Signed)
Was placed on meds in the past but she self d/c it. I recommended counseling, seems like she will benefit from this but she declined. She is started on Lexapro today. F?U in 4 wks for reassessment.

## 2016-03-18 NOTE — Telephone Encounter (Signed)
Patient is aware of urine results and that we will be sending her to [facility]. Islam Villescas,CMA

## 2016-03-18 NOTE — Telephone Encounter (Signed)
-----   Message from [name], MD sent at 03/18/2016  4:05 PM EDT ----- Please contact patient. Advise her that her urine shows protein again. This cough be related to her chronic kidney disease. I have referred her to nephrologist.  Please help with nephrology referral.

## 2016-03-18 NOTE — Progress Notes (Signed)
Subjective:     Patient ID: Jennifer Foster, female   DOB: February 02, 1946, 70 y.o.   MRN: [id number]  HPI HTN/HLD: Compliant with meds. Here for follow up. Nerve issue: Feels stressed, get anxious with so many people living in her house now. Every little thing upset her.  Denies suicidal ideation. At times she feels like walking away and not come back. She feels a little depressed as well. She stated she does not want to see a psychiatrist or a psychologist for counseling for now. This has been going on over 2 years but worsening. She worry she had not seen her grandson in over 2 yrs and she is taking care of her great grandson. Too much stress around as well. SOB/Tobacco: C/O worsening SOB on exertion on going for more than 1 month. She gets tired easily, denies orthopnea or PND. She smokes 3-4 sticks of tobacco a day, trying to cut back. No chest pain or chest tightness. CKD/Proteinuria: Here for follow up. No concern.  Current Outpatient Prescriptions on File Prior to Visit  Medication Sig Dispense Refill  . amLODipine (NORVASC) 10 MG tablet Take 1 tablet (10 mg total) by mouth daily. 90 tablet 1  . hydrALAZINE (APRESOLINE) 25 MG tablet Take 1 tablet (25 mg total) by mouth 2 (two) times daily. 60 tablet 2  . rosuvastatin (CRESTOR) 20 MG tablet Take 1 tablet (20 mg total) by mouth at bedtime. 90 tablet 1  . albuterol (PROVENTIL HFA;VENTOLIN HFA) 108 (90 BASE) MCG/ACT inhaler Inhale 2 puffs into the lungs every 6 (six) hours as needed for wheezing or shortness of breath. Reported on 03/18/2016    . ferrous sulfate 325 (65 FE) MG tablet Take 325 mg by mouth daily with breakfast. Reported on 03/18/2016    . Multiple Vitamins-Calcium (ONE-A-DAY WOMENS FORMULA PO) Take 1 tablet by mouth daily.    . nicotine (EQ NICOTINE) 7 mg/24hr patch Place 1 patch (7 mg total) onto the skin daily. (Patient not taking: Reported on 07/24/2015) 28 patch 0   No current facility-administered medications on file prior to visit.    Past Medical History  Diagnosis Date  . Hypertension   . Joint pain   . GERD (gastroesophageal reflux disease)   . Hyperlipemia   . History of asthma   . History of anemia   . Lipoma   . Asthma   . Acute blood loss anemia 06/26/2015  . Chronic kidney disease (CKD), stage III (moderate) 06/27/2015  . Gout   . Acute blood loss anemia 06/26/2015  . DIVERTICULAR BLEEDING, HX OF 10/14/2007    Colonoscopy 2008 showed diverticulosis.      Review of Systems  Respiratory: Positive for shortness of breath. Negative for cough.   Cardiovascular: Negative.  Negative for chest pain.  Gastrointestinal: Negative.   Genitourinary: Negative.   Psychiatric/Behavioral: Negative for suicidal ideas and sleep disturbance. The patient is nervous/anxious.   All other systems reviewed and are negative.      Filed Vitals:   03/18/16 1027  BP: 150/68  Pulse: 69  Temp: 98.6 F (37 C)  TempSrc: Oral  Weight: 160 lb (72.576 kg)  SpO2: 97%    Objective:   Physical Exam  Constitutional: She is oriented to person, place, and time. She appears well-developed. No distress.  Cardiovascular: Normal rate, regular rhythm and normal heart sounds.   No murmur heard. Pulmonary/Chest: Effort normal and breath sounds normal. No respiratory distress. She has no wheezes. She exhibits no tenderness.  Abdominal: Soft.  Bowel sounds are normal. She exhibits no distension and no mass. There is no tenderness.  Musculoskeletal: Normal range of motion. She exhibits no edema.  Neurological: She is alert and oriented to person, place, and time. No cranial nerve deficit.  Psychiatric: Her speech is normal and behavior is normal. Judgment and thought content normal. Her mood appears anxious. Her affect is not angry. Cognition and memory are normal. She does not exhibit a depressed mood.  GAD score of 12. PHQ9 score of 6  Nursing note and vitals reviewed.      Assessment:     HTN HLD Anxiety with  depression SOB Tobacco smoking CKD/Proteinuria      Plan:     Check problem list.

## 2016-03-19 ENCOUNTER — Telehealth: Payer: Self-pay | Admitting: Family Medicine

## 2016-03-19 LAB — LIPID PANEL
Cholesterol: 129 mg/dL (ref 125–200)
HDL: 57 mg/dL (ref >=46)
LDL CALC: 40 mg/dL (ref <130)
Total CHOL/HDL Ratio: 2.3 Ratio (ref <=5.0)
Triglycerides: 158 mg/dL — ABNORMAL HIGH (ref <150)
VLDL: 32 mg/dL — AB (ref <30)

## 2016-03-19 LAB — HEPATITIS C ANTIBODY: HCV Ab: NEGATIVE

## 2016-03-19 NOTE — Telephone Encounter (Signed)
Results discussed with patient. Proteinuria, improved cholesterol and neg Hep C. All questions were answered.

## 2016-03-21 ENCOUNTER — Other Ambulatory Visit (HOSPITAL_COMMUNITY): Payer: Commercial Managed Care - HMO

## 2016-03-24 ENCOUNTER — Other Ambulatory Visit (HOSPITAL_COMMUNITY): Payer: Commercial Managed Care - HMO

## 2016-04-02 ENCOUNTER — Other Ambulatory Visit (HOSPITAL_COMMUNITY): Payer: Commercial Managed Care - HMO

## 2016-04-02 DIAGNOSIS — R0989 Other specified symptoms and signs involving the circulatory and respiratory systems: Secondary | ICD-10-CM

## 2016-04-08 ENCOUNTER — Other Ambulatory Visit: Payer: Self-pay | Admitting: Family Medicine

## 2016-04-08 MED ORDER — AMLODIPINE BESYLATE 10 MG PO TABS: 10.0000 mg | ORAL_TABLET | Freq: Every day | ORAL | 1 refills | Status: DC

## 2016-04-08 MED ORDER — HYDRALAZINE HCL 25 MG PO TABS: 25.0000 mg | ORAL_TABLET | Freq: Two times a day (BID) | ORAL | 2 refills | Status: DC

## 2016-04-08 NOTE — Telephone Encounter (Signed)
Pt would like a refill on her bp medication. Please call it in to [facility] on [address]. Thanks! ep

## 2016-04-17 ENCOUNTER — Ambulatory Visit (HOSPITAL_COMMUNITY): Payer: Commercial Managed Care - HMO | Attending: Cardiovascular Disease

## 2016-04-17 DIAGNOSIS — R0989 Other specified symptoms and signs involving the circulatory and respiratory systems: Secondary | ICD-10-CM

## 2016-04-23 ENCOUNTER — Telehealth: Payer: Self-pay | Admitting: *Deleted

## 2016-04-23 NOTE — Telephone Encounter (Signed)
Patient scheduled to see Dr. Mercy Moore with [facility] on 9/8. Diagnosis code N18.9, Needs [employer] referral placed.

## 2016-04-23 NOTE — Telephone Encounter (Signed)
Will forward to referral coordinator. Jazmin Hartsell,CMA

## 2016-04-23 NOTE — Telephone Encounter (Signed)
Completed. Auth # [id number]

## 2016-05-07 ENCOUNTER — Other Ambulatory Visit: Payer: Self-pay | Admitting: Family Medicine

## 2016-05-07 MED ORDER — PANTOPRAZOLE SODIUM 40 MG PO TBEC: 40.0000 mg | DELAYED_RELEASE_TABLET | Freq: Every day | ORAL | 0 refills | Status: DC | PRN

## 2016-05-07 NOTE — Telephone Encounter (Signed)
Pt needs a refill on protonix. Pt ran out of medication yesterday. Please advise. Thanks! ep

## 2016-07-18 ENCOUNTER — Other Ambulatory Visit: Payer: Self-pay | Admitting: Family Medicine

## 2016-07-28 ENCOUNTER — Other Ambulatory Visit: Payer: Self-pay | Admitting: Family Medicine

## 2016-07-28 DIAGNOSIS — Z1231 Encounter for screening mammogram for malignant neoplasm of breast: Secondary | ICD-10-CM

## 2016-08-15 ENCOUNTER — Ambulatory Visit (INDEPENDENT_AMBULATORY_CARE_PROVIDER_SITE_OTHER): Payer: Commercial Managed Care - HMO | Admitting: Family Medicine

## 2016-08-15 ENCOUNTER — Encounter: Payer: Self-pay | Admitting: Family Medicine

## 2016-08-15 DIAGNOSIS — L309 Dermatitis, unspecified: Secondary | ICD-10-CM | POA: Insufficient documentation

## 2016-08-15 DIAGNOSIS — N183 Chronic kidney disease, stage 3 unspecified: Secondary | ICD-10-CM

## 2016-08-15 DIAGNOSIS — I10 Essential (primary) hypertension: Secondary | ICD-10-CM

## 2016-08-15 DIAGNOSIS — F418 Other specified anxiety disorders: Secondary | ICD-10-CM

## 2016-08-15 DIAGNOSIS — F419 Anxiety disorder, unspecified: Secondary | ICD-10-CM

## 2016-08-15 DIAGNOSIS — F329 Major depressive disorder, single episode, unspecified: Secondary | ICD-10-CM

## 2016-08-15 MED ORDER — TRIAMCINOLONE ACETONIDE 0.1 % EX CREA: 1.0000 "application " | TOPICAL_CREAM | Freq: Two times a day (BID) | CUTANEOUS | 0 refills | Status: DC

## 2016-08-15 NOTE — Assessment & Plan Note (Signed)
Etiology unclear. Likely contact dermatitis vs bug bite. Treat with topical steroid. If no improvement we will further assess her at the [facility] clinic. She agreed with plan.

## 2016-08-15 NOTE — Assessment & Plan Note (Signed)
BP well controlled. Continue current regimen. Norvasc 10 mg qd. Hydralazine 25 mg BID. Continue home BP monitoring.

## 2016-08-15 NOTE — Progress Notes (Signed)
Subjective:     Patient ID: Jennifer Foster, female   DOB: 1946-03-08, 70 y.o.   MRN: [id number]  HPI  HTN: here for follow up. She is compliant with all her medications. Denies any BP related concern. Anxiety: She self discontinued her Lexapro. She stated she does not want to see any counselor either. She stated it is her husband that feels she has nerve issues.Denies any other psych concern. Rash:C/O rash on her left side ongoing for > 1 week off and on, itchy, no change in diet. She felt it might be due to exposure to a chemical she uses to wash her bathroom at home. Denies sick contact or contact with person with similar rash. Proteinuria/CKD:Denies GU symptoms. She did not go to her nephrology appointment. She stated she will like to reschedule.  Current Outpatient Prescriptions on File Prior to Visit  Medication Sig Dispense Refill  . albuterol (PROVENTIL HFA;VENTOLIN HFA) 108 (90 BASE) MCG/ACT inhaler Inhale 2 puffs into the lungs every 6 (six) hours as needed for wheezing or shortness of breath. Reported on 03/18/2016    . amLODipine (NORVASC) 10 MG tablet Take 1 tablet (10 mg total) by mouth daily. 90 tablet 1  . hydrALAZINE (APRESOLINE) 25 MG tablet Take 1 tablet (25 mg total) by mouth 2 (two) times daily. 180 tablet 2  . rosuvastatin (CRESTOR) 20 MG tablet take 1 tablet by mouth at bedtime 90 tablet 1  . escitalopram (LEXAPRO) 10 MG tablet Take 1 tablet (10 mg total) by mouth daily. (Patient not taking: Reported on 08/15/2016) 30 tablet 1  . ferrous sulfate 325 (65 FE) MG tablet Take 325 mg by mouth daily with breakfast. Reported on 03/18/2016    . Multiple Vitamins-Calcium (ONE-A-DAY WOMENS FORMULA PO) Take 1 tablet by mouth daily.    . nicotine (EQ NICOTINE) 7 mg/24hr patch Place 1 patch (7 mg total) onto the skin daily. (Patient not taking: Reported on 08/15/2016) 28 patch 0  . pantoprazole (PROTONIX) 40 MG tablet Take 1 tablet (40 mg total) by mouth daily as needed. Medication may  worsen kidney function, consider switch to other medication. Discontinue medication if not having reflux symptoms. (Patient not taking: Reported on 08/15/2016) 90 tablet 0   No current facility-administered medications on file prior to visit.    Past Medical History:  Diagnosis Date  . Acute blood loss anemia 06/26/2015  . Acute blood loss anemia 06/26/2015  . Asthma   . Chronic kidney disease (CKD), stage III (moderate) 06/27/2015  . DIVERTICULAR BLEEDING, HX OF 10/14/2007   Colonoscopy 2008 showed diverticulosis.   [name] GERD (gastroesophageal reflux disease)   . Gout   . History of anemia   . History of asthma   . Hyperlipemia   . Hypertension   . Joint pain   . Lipoma      Review of Systems  Respiratory: Negative.   Cardiovascular: Negative.   Gastrointestinal: Negative.   Skin: Positive for rash.  Psychiatric/Behavioral: Negative for agitation, self-injury and suicidal ideas.  All other systems reviewed and are negative.  Vitals:   08/15/16 0909  BP: 124/64  Pulse: 78  Temp: 98.5 F (36.9 C)  TempSrc: Oral  SpO2: 98%  Weight: 157 lb (71.2 kg)  Height: 5\' 4"  (1.626 m)       Objective:   Physical Exam  Constitutional: She is oriented to person, place, and time. She appears well-developed. No distress.  Cardiovascular: Normal rate, regular rhythm and normal heart sounds.   No murmur  heard. Pulmonary/Chest: Breath sounds normal. No respiratory distress.  Abdominal: Soft. Bowel sounds are normal. She exhibits no distension and no mass. There is no tenderness.  Musculoskeletal: Normal range of motion.  Neurological: She is alert and oriented to person, place, and time.  Skin:     Psychiatric: Her behavior is normal. Judgment and thought content normal.  She was upset with her husband during this visit  Nursing note and vitals reviewed.      Assessment:     HTN Anxiety Skin rash Proteinuria/CKD    Plan:     Check problem list

## 2016-08-15 NOTE — Assessment & Plan Note (Signed)
No acute change. Currently denies suicidal ideation.  Not homicidal. She will not like to get back on meds. I recommended psychologic counseling but she declined. She stated she feels well but upset that her husband keep telling her she has issues with her nerves. Monitor for now on no meds. F/U as needed.

## 2016-08-15 NOTE — Assessment & Plan Note (Addendum)
With proteinuria. She did not follow through with her referral to nephrology. Patient advised to call to reschedule her nephrology appointment. I will refer again if needed. She will let me know. Avoid nephrotoxic agents including PPI. She verbalized understanding.  Note: Bmet and CBC ( anemia) check recommended today but she declined blood work.

## 2016-08-15 NOTE — Patient Instructions (Signed)
It was nice seeing you today. I am not sure what your rash is. It could be reaction to the chemical you use to clean your bathroom. Please avoid contact with it. It could also be due to bug bites. I have prescribed a cream to help relieve your symptoms. Please call to schedule dermatology appointment if this worsens

## 2016-08-28 ENCOUNTER — Ambulatory Visit: Payer: Commercial Managed Care - HMO

## 2016-09-29 ENCOUNTER — Ambulatory Visit
Admission: RE | Admit: 2016-09-29 | Discharge: 2016-09-29 | Disposition: A | Discharge status: Home / Self Care | Payer: Commercial Managed Care - HMO | Source: Ambulatory Visit | Attending: Family Medicine | Admitting: Family Medicine

## 2016-09-29 DIAGNOSIS — Z1231 Encounter for screening mammogram for malignant neoplasm of breast: Secondary | ICD-10-CM

## 2016-11-25 ENCOUNTER — Ambulatory Visit (INDEPENDENT_AMBULATORY_CARE_PROVIDER_SITE_OTHER): Payer: Medicare HMO | Admitting: Family Medicine

## 2016-11-25 ENCOUNTER — Encounter: Payer: Self-pay | Admitting: Family Medicine

## 2016-11-25 VITALS — BP 124/60 | HR 74 | Temp 98.4°F | Ht 64.0 in | Wt 145.0 lb

## 2016-11-25 DIAGNOSIS — D649 Anemia, unspecified: Secondary | ICD-10-CM | POA: Diagnosis not present

## 2016-11-25 DIAGNOSIS — R42 Dizziness and giddiness: Secondary | ICD-10-CM

## 2016-11-25 DIAGNOSIS — R519 Headache, unspecified: Secondary | ICD-10-CM

## 2016-11-25 DIAGNOSIS — G8929 Other chronic pain: Secondary | ICD-10-CM

## 2016-11-25 DIAGNOSIS — M25562 Pain in left knee: Secondary | ICD-10-CM | POA: Diagnosis not present

## 2016-11-25 DIAGNOSIS — I10 Essential (primary) hypertension: Secondary | ICD-10-CM | POA: Diagnosis not present

## 2016-11-25 DIAGNOSIS — R51 Headache: Secondary | ICD-10-CM | POA: Diagnosis not present

## 2016-11-25 LAB — POCT SEDIMENTATION RATE: POCT SED RATE: 30 mm/h — AB (ref 0–22)

## 2016-11-25 NOTE — Patient Instructions (Addendum)
It was nice seeing you today. I am sorry about your knee pain and headache. We will get some labs today to check for a certain cause of headache. Otherwise use Tylenol as needed for headache and knee pain. If symptoms worsens please go to the ED.  Hold Amlodipine for now. I will like to see you back in 2 weeks for BP check.  We will check you for anemia as a cause of your dizziness. Please go to the ED if you fall in the future.

## 2016-11-25 NOTE — Assessment & Plan Note (Addendum)
BP looks good on current regimen. However, given hx of recurrent dizziness and low normal BP, I will hold her Norvasc for now. Continue Hydralazine 25 mg BID. Home BP measurement recommended. Bmet checked today. F/U in 2 weeks for reassessment.

## 2016-11-25 NOTE — Assessment & Plan Note (Signed)
Etiology unclear. No sign of meningeal irritation.  No neurologic deficit. Giant cell arteritis less likely given presentation, however, I will check for ESR and CPR. In the mean time use Tylenol as needed for pain. She seems to have been having headache complaints since 2016. I will discuss imaging at next visit.

## 2016-11-25 NOTE — Progress Notes (Signed)
Subjective:     Patient ID: Jennifer Foster, female   DOB: Oct 02, 1945, 71 y.o.   MRN: [id number]  Headache   This is a chronic (Pain of her right temporal) problem. Episode onset: This has been going on for a while but more so in the last 2 weeks. The problem occurs intermittently. The pain is located in the right unilateral and temporal region. The pain does not radiate. The pain quality is similar to prior headaches. The quality of the pain is described as throbbing (sharp pain). The pain is at a severity of 3/10. The pain is mild. Pertinent negatives include no anorexia, back pain, coughing, dizziness, drainage, ear pain, loss of balance, phonophobia, photophobia or visual change. Associated symptoms comments: No chest pain or palpitation. Nothing aggravates the symptoms. Treatments tried: Anointing oil. The treatment provided mild relief. Her past medical history is significant for hypertension. There is no history of cluster headaches or migraine headaches.  Dizziness  This is a recurrent problem. Episode onset: worsening in the last 1 month.She fell and blanked out for few min a month ago, she did not go to the hospital. Associated symptoms include headaches. Pertinent negatives include no anorexia, coughing, vertigo or visual change. Associated symptoms comments: Need to get a new glasses. No new vision change. The symptoms are aggravated by standing. She has tried nothing for the symptoms.  Knee Pain   Incident onset: Left knee pain  x more than 6 months ago. There was no injury mechanism. The pain is present in the left knee. The pain is at a severity of 5/10. The pain is mild. Pertinent negatives include no inability to bear weight or loss of sensation. Nothing aggravates the symptoms. She has tried nothing for the symptoms.  HTN: Here for follow up. She has been compliant with her meds.  Current Outpatient Prescriptions on File Prior to Visit  Medication Sig Dispense Refill  . albuterol  (PROVENTIL HFA;VENTOLIN HFA) 108 (90 BASE) MCG/ACT inhaler Inhale 2 puffs into the lungs every 6 (six) hours as needed for wheezing or shortness of breath. Reported on 03/18/2016    . amLODipine (NORVASC) 10 MG tablet Take 1 tablet (10 mg total) by mouth daily. 90 tablet 1  . hydrALAZINE (APRESOLINE) 25 MG tablet Take 1 tablet (25 mg total) by mouth 2 (two) times daily. 180 tablet 2  . Multiple Vitamins-Calcium (ONE-A-DAY WOMENS FORMULA PO) Take 1 tablet by mouth daily.    . rosuvastatin (CRESTOR) 20 MG tablet take 1 tablet by mouth at bedtime 90 tablet 1  . escitalopram (LEXAPRO) 10 MG tablet Take 1 tablet (10 mg total) by mouth daily. (Patient not taking: Reported on 08/15/2016) 30 tablet 1  . ferrous sulfate 325 (65 FE) MG tablet Take 325 mg by mouth daily with breakfast. Reported on 03/18/2016    . nicotine (EQ NICOTINE) 7 mg/24hr patch Place 1 patch (7 mg total) onto the skin daily. (Patient not taking: Reported on 08/15/2016) 28 patch 0   No current facility-administered medications on file prior to visit.    Past Medical History:  Diagnosis Date  . Acute blood loss anemia 06/26/2015  . Acute blood loss anemia 06/26/2015  . Asthma   . Chronic kidney disease (CKD), stage III (moderate) 06/27/2015  . DIVERTICULAR BLEEDING, HX OF 10/14/2007   Colonoscopy 2008 showed diverticulosis.   [name] GERD (gastroesophageal reflux disease)   . Gout   . History of anemia   . History of asthma   . Hyperlipemia   .  Hypertension   . Joint pain   . Lipoma    Vitals:   11/25/16 0919  BP: 124/60  Pulse: 74  Temp: 98.4 F (36.9 C)  TempSrc: Oral  SpO2: 98%  Weight: 145 lb (65.8 kg)  Height: 5\' 4"  (1.626 m)     Review of Systems  HENT: Negative for ear pain.   Eyes: Negative for photophobia.  Respiratory: Negative for cough.   Cardiovascular: Negative.   Gastrointestinal: Negative.  Negative for anorexia.  Genitourinary: Negative.   Musculoskeletal: Negative for back pain.  Neurological:  Positive for headaches. Negative for dizziness, vertigo and loss of balance.  All other systems reviewed and are negative.      Objective:   Physical Exam  Constitutional: She appears well-developed. No distress.  HENT:  Head: Normocephalic and atraumatic.  No temporal tenderness, no palpable temporal vessel.  Cardiovascular: Normal rate, regular rhythm and normal heart sounds.   No murmur heard. Pulmonary/Chest: Effort normal and breath sounds normal. No respiratory distress. She has no wheezes.  Abdominal: Soft. Bowel sounds are normal. She exhibits no distension and no mass. There is no tenderness.  Musculoskeletal: Normal range of motion. She exhibits no edema.       Right knee: Normal.       Left knee: Normal.  Neurological: She is alert. She has normal strength. She displays no tremor. No cranial nerve deficit or sensory deficit. She displays a negative Romberg sign. She displays no Babinski's sign on the right side. She displays no Babinski's sign on the left side.  Psychiatric: Her mood appears not anxious.  Nursing note and vitals reviewed.      Assessment:     Headache Dizziness Knee pain HTN    Plan:     Check problem list    While I was ending the visit. She asked about getting back on anxiety medication. She stated Lexapro makes her jittery and was wondering if she can take a lesser dose. Lowest dose is 5 mg which might be sub-optimal for anxiety. For now I told her she can take half of her current meds. I will readdress it during next visit.

## 2016-11-25 NOTE — Assessment & Plan Note (Signed)
This was discussed previously. Xray was ordered to assess for OA but she did not get it. Currently she is asymptomatic. Tylenol recommended prn pain. Consider imaging in the future.

## 2016-11-25 NOTE — Assessment & Plan Note (Addendum)
Intermittent. Currently asymptomatic.  She fell 1 month ago. Differentials include anemia, orthostatic hypotension, medication induced hypotension, vasovagal presyncope/syncope. She does not measure her BP whenever she feels dizzy. BP looks good today. Orthostatic vitals neg. Advised to hold Norvasc for now while she continues Hydralazine. Home BP monitoring instruction given. F/U in 2 weeks for reassessment of her BP. CBC checked to rule out anemia given PMX. If no improvement or she remains symptomatic, I will further work her up for cardiac pathology. She is advised to go to the ED if actively feeling dizzy or if she has syncopal episode. She agreed with plan and verbalized understanding.

## 2016-11-26 ENCOUNTER — Telehealth: Payer: Self-pay | Admitting: Family Medicine

## 2016-11-26 DIAGNOSIS — N183 Chronic kidney disease, stage 3 unspecified: Secondary | ICD-10-CM

## 2016-11-26 DIAGNOSIS — R809 Proteinuria, unspecified: Secondary | ICD-10-CM

## 2016-11-26 LAB — BASIC METABOLIC PANEL
BUN/Creatinine Ratio: 9 — ABNORMAL LOW (ref 12–28)
BUN: 11 mg/dL (ref 8–27)
CALCIUM: 8.9 mg/dL (ref 8.7–10.3)
CO2: 23 mmol/L (ref 18–29)
Chloride: 103 mmol/L (ref 96–106)
Creatinine, Ser: 1.26 mg/dL — ABNORMAL HIGH (ref 0.57–1.00)
GFR calc Af Amer: 51 mL/min/{1.73_m2} — ABNORMAL LOW (ref >59)
GFR, EST NON AFRICAN AMERICAN: 44 mL/min/{1.73_m2} — AB (ref >59)
GLUCOSE: 88 mg/dL (ref 65–99)
Potassium: 3.7 mmol/L (ref 3.5–5.2)
Sodium: 141 mmol/L (ref 134–144)

## 2016-11-26 LAB — CBC
HEMOGLOBIN: 11.8 g/dL (ref 11.1–15.9)
Hematocrit: 34.9 % (ref 34.0–46.6)
MCH: 27.8 pg (ref 26.6–33.0)
MCHC: 33.8 g/dL (ref 31.5–35.7)
MCV: 82 fL (ref 79–97)
PLATELETS: 236 10*3/uL (ref 150–379)
RBC: 4.24 x10E6/uL (ref 3.77–5.28)
RDW: 15.8 % — ABNORMAL HIGH (ref 12.3–15.4)
WBC: 5.3 10*3/uL (ref 3.4–10.8)

## 2016-11-26 LAB — C-REACTIVE PROTEIN: CRP: 3.3 mg/L (ref 0.0–4.9)

## 2016-11-26 NOTE — Telephone Encounter (Signed)
Result discussed with patient. Hemoglobin is normal hence anemia less likely as a cause of her dizziness. ESR <50 and CRP normal. Giant cell arteritis unlikely cause of her temporal headache. She is advised to take Tylenol as needed for HA. If it worsens to go to the ED. Creatinine improved from the last time. She also has hx of proteinuria for which she was advised nephrology follow up. She did not make appointment. She agreed with me placing another referral. Referral placed. F/U as planned for BP check.

## 2016-12-09 ENCOUNTER — Ambulatory Visit: Payer: Medicare HMO | Admitting: Family Medicine

## 2016-12-23 DIAGNOSIS — I129 Hypertensive chronic kidney disease with stage 1 through stage 4 chronic kidney disease, or unspecified chronic kidney disease: Secondary | ICD-10-CM | POA: Diagnosis not present

## 2016-12-23 DIAGNOSIS — N183 Chronic kidney disease, stage 3 (moderate): Secondary | ICD-10-CM | POA: Diagnosis not present

## 2016-12-24 ENCOUNTER — Other Ambulatory Visit: Payer: Self-pay | Admitting: Nephrology

## 2016-12-24 DIAGNOSIS — N183 Chronic kidney disease, stage 3 unspecified: Secondary | ICD-10-CM

## 2016-12-27 ENCOUNTER — Other Ambulatory Visit: Payer: Self-pay | Admitting: Family Medicine

## 2017-01-05 ENCOUNTER — Other Ambulatory Visit: Payer: Medicare HMO

## 2017-01-13 ENCOUNTER — Ambulatory Visit
Admission: RE | Admit: 2017-01-13 | Discharge: 2017-01-13 | Disposition: A | Discharge status: Home / Self Care | Payer: Medicare HMO | Source: Ambulatory Visit | Attending: Nephrology | Admitting: Nephrology

## 2017-01-13 DIAGNOSIS — N183 Chronic kidney disease, stage 3 unspecified: Secondary | ICD-10-CM

## 2017-01-13 DIAGNOSIS — N281 Cyst of kidney, acquired: Secondary | ICD-10-CM | POA: Diagnosis not present

## 2017-01-30 ENCOUNTER — Encounter: Payer: Self-pay | Admitting: Family Medicine

## 2017-01-30 ENCOUNTER — Ambulatory Visit (INDEPENDENT_AMBULATORY_CARE_PROVIDER_SITE_OTHER): Payer: Medicare HMO | Admitting: Family Medicine

## 2017-01-30 VITALS — BP 110/60 | HR 63 | Temp 97.8°F | Ht 64.0 in | Wt 138.0 lb

## 2017-01-30 DIAGNOSIS — R634 Abnormal weight loss: Secondary | ICD-10-CM | POA: Insufficient documentation

## 2017-01-30 DIAGNOSIS — E876 Hypokalemia: Secondary | ICD-10-CM | POA: Diagnosis not present

## 2017-01-30 DIAGNOSIS — N183 Chronic kidney disease, stage 3 unspecified: Secondary | ICD-10-CM

## 2017-01-30 DIAGNOSIS — F419 Anxiety disorder, unspecified: Secondary | ICD-10-CM

## 2017-01-30 DIAGNOSIS — F329 Major depressive disorder, single episode, unspecified: Secondary | ICD-10-CM

## 2017-01-30 DIAGNOSIS — I10 Essential (primary) hypertension: Secondary | ICD-10-CM | POA: Diagnosis not present

## 2017-01-30 DIAGNOSIS — F32A Depression, unspecified: Secondary | ICD-10-CM

## 2017-01-30 DIAGNOSIS — F17200 Nicotine dependence, unspecified, uncomplicated: Secondary | ICD-10-CM

## 2017-01-30 MED ORDER — NICOTINE 7 MG/24HR TD PT24: 7.0000 mg | MEDICATED_PATCH | Freq: Every day | TRANSDERMAL | 0 refills | Status: DC

## 2017-01-30 NOTE — Assessment & Plan Note (Addendum)
Counseling done. Smokes less than 6 sticks per day per patient but difficult to quit all together. Currently unable to chew her nicotine gum. I wil escribed nicotine patch (lowest dose). She will contact her insurance for coverage. Continue to work on cutting back.

## 2017-01-30 NOTE — Assessment & Plan Note (Addendum)
PHQ9 score of 0 with GAD7 score of 8 Mild reactive anxiety due to stress and her inability to visit her grandson frequently. Counseling done. I discussed starting her on medication but she will like to hold off for now. F/U in 4 weeks for reassessment.

## 2017-01-30 NOTE — Progress Notes (Signed)
Subjective:     Patient ID: Jennifer Foster, female   DOB: [birth year], 71 y.o.   MRN: [id number]  HPI HTN: She is compliant with her meds. Here for follow up. She is taking both Norvasc 10 mg qd and Hydralazine 25 mg BID. Doing well on her meds. No concern. CKD: She had nephrology appointment few weeks ago. She was told that her potassium was low hence she went to get an OTC potassium supplement which she has been taking daily. Denies any other concern today. Anxiety: Patient is concern about her anxiety. She worry a lot about [place] Due to [place] distance she had not been able to see him since 2015. This causes her a lot of anxiety. She cries everyday about this. Weight loss:Patient is concern about weight loss. She is not actively trying to loss weight. However she is unable to chew food due to tooth ache for which she is scheduled for tooth extraction. She started drinking one bottle of ensure daily recently. She denies SOB, no chest pain, no change in bowel habit, no blood in her stool. She feels excessively cold. Tobacco: She smokes 3-4 sticks of tobacco daily. Trying to cut back on it. She is unable to chew her nicotine gum. No respiratory symptoms.  Current Outpatient Prescriptions on File Prior to Visit  Medication Sig Dispense Refill  . amLODipine (NORVASC) 10 MG tablet take 1 tablet by mouth once daily 90 tablet 1  . ferrous sulfate 325 (65 FE) MG tablet Take 325 mg by mouth daily with breakfast. Reported on 03/18/2016    . hydrALAZINE (APRESOLINE) 25 MG tablet Take 1 tablet (25 mg total) by mouth 2 (two) times daily. 180 tablet 2  . rosuvastatin (CRESTOR) 20 MG tablet take 1 tablet by mouth at bedtime 90 tablet 1  . albuterol (PROVENTIL HFA;VENTOLIN HFA) 108 (90 BASE) MCG/ACT inhaler Inhale 2 puffs into the lungs every 6 (six) hours as needed for wheezing or shortness of breath. Reported on 03/18/2016    . escitalopram (LEXAPRO) 10 MG tablet Take 1 tablet  (10 mg total) by mouth daily. (Patient not taking: Reported on 08/15/2016) 30 tablet 1  . Multiple Vitamins-Calcium (ONE-A-DAY WOMENS FORMULA PO) Take 1 tablet by mouth daily.    . nicotine (EQ NICOTINE) 7 mg/24hr patch Place 1 patch (7 mg total) onto the skin daily. (Patient not taking: Reported on 08/15/2016) 28 patch 0   No current facility-administered medications on file prior to visit.    Past Medical History:  Diagnosis Date  . Acute blood loss anemia 06/26/2015  . Acute blood loss anemia 06/26/2015  . Asthma   . Chronic kidney disease (CKD), stage III (moderate) 06/27/2015  . DIVERTICULAR BLEEDING, HX OF 10/14/2007   Colonoscopy 2008 showed diverticulosis.   [name] GERD (gastroesophageal reflux disease)   . Gout   . History of anemia   . History of asthma   . Hyperlipemia   . Hypertension   . Joint pain   . Lipoma   . SOB (shortness of breath) 03/18/2016     Review of Systems  Constitutional: Positive for unexpected weight change. Negative for fatigue and fever.  Respiratory: Negative.   Cardiovascular: Negative.   Gastrointestinal: Negative.   Neurological: Negative.   Psychiatric/Behavioral: Negative for suicidal ideas. The patient is nervous/anxious.   All other systems reviewed and are negative.      Vitals:   01/30/17 0907  BP: 110/60  Pulse: 63  Temp: 97.8 F (36.6 C)  TempSrc: Oral  SpO2: 98%  Weight: 138 lb (62.6 kg)  Height: 5\' 4"  (1.626 m)    Body mass index is 23.69 kg/m.   Objective:   Physical Exam  Constitutional: She is oriented to person, place, and time. She appears well-developed. No distress.  Cardiovascular: Normal rate, regular rhythm and normal heart sounds.   No murmur heard. Pulmonary/Chest: Effort normal and breath sounds normal. No respiratory distress. She has no wheezes.  Abdominal: Soft. She exhibits no distension and no mass. There is no tenderness.  Musculoskeletal: Normal range of motion. She exhibits no edema.   Neurological: She is alert and oriented to person, place, and time.  Psychiatric: Her speech is normal and behavior is normal. Her mood appears anxious. Cognition and memory are normal. She expresses no homicidal and no suicidal ideation. She expresses no suicidal plans and no homicidal plans.  Teary whenever she talks about her grandson  Nursing note and vitals reviewed.      Assessment:     HTN CKD Anxiety Weight loss Tobacco use    Plan:     Check problem list.

## 2017-01-30 NOTE — Assessment & Plan Note (Signed)
She is doing well on her current regimen. She denies dizziness or weakness. Continue Norvasc and Hydralazine. Consider cutting back on Norvasc to 5 mg since BP looks very good I.e low normal.

## 2017-01-30 NOTE — Patient Instructions (Signed)
It was nice seeing you today. I am sorry about your weight loss. We will check some blood test today. Please continue drinking a bottle of ensure daily. I will like to recheck your weight in 4 week.

## 2017-01-30 NOTE — Assessment & Plan Note (Signed)
Renal U/S report reviewed. Bmet checked today. Concern about hypokalemia based on lab result from her nephrologist's office. I will contact her regarding potassium supplement based on result. In the mean time, I recommended diet high in potassium instead of taking OTC potassium supplement. She agreed with plan.

## 2017-01-30 NOTE — Assessment & Plan Note (Signed)
She was 160 lbs July 2017. 157 lbs Dec 2017. Between Dec and now ( 5 months) she lost 9 lbs. Might be due to low calorie intake, vs stress and anxiety. R/O metabolic or infectious cause. TSH checked with HIV. Bmet checked to assess glucose. If elevated will check A1C. Diet and stress relieving counseling done. Continue ensure daily with 3 quare meal and snacking in between. I will reevaluate her in 4 weeks or soon if worsening. Given hx of tobacco smoking, to consider CT chest to r/o malignancy if other test comes back normal. Recent colonoscopy done less than 2 yrs ago was normal.

## 2017-01-31 LAB — BASIC METABOLIC PANEL
BUN/Creatinine Ratio: 9 — ABNORMAL LOW (ref 12–28)
BUN: 13 mg/dL (ref 8–27)
CO2: 20 mmol/L (ref 18–29)
CREATININE: 1.46 mg/dL — AB (ref 0.57–1.00)
Calcium: 9.1 mg/dL (ref 8.7–10.3)
Chloride: 108 mmol/L — ABNORMAL HIGH (ref 96–106)
GFR, EST AFRICAN AMERICAN: 42 mL/min/{1.73_m2} — AB (ref >59)
GFR, EST NON AFRICAN AMERICAN: 37 mL/min/{1.73_m2} — AB (ref >59)
Glucose: 102 mg/dL — ABNORMAL HIGH (ref 65–99)
POTASSIUM: 4 mmol/L (ref 3.5–5.2)
SODIUM: 144 mmol/L (ref 134–144)

## 2017-01-31 LAB — HIV ANTIBODY (ROUTINE TESTING W REFLEX): HIV SCREEN 4TH GENERATION: NONREACTIVE

## 2017-01-31 LAB — TSH: TSH: 0.826 u[IU]/mL (ref 0.450–4.500)

## 2017-02-02 ENCOUNTER — Telehealth: Payer: Self-pay | Admitting: Family Medicine

## 2017-02-02 NOTE — Telephone Encounter (Signed)
Lab result discussed with her. K+ looks good. Advise she can stop potassium supplement and each potassium rich meal. F/U as needed.

## 2017-02-06 ENCOUNTER — Other Ambulatory Visit: Payer: Self-pay | Admitting: Family Medicine

## 2017-02-17 ENCOUNTER — Other Ambulatory Visit: Payer: Self-pay | Admitting: Family Medicine

## 2017-02-20 DIAGNOSIS — Z720 Tobacco use: Secondary | ICD-10-CM | POA: Diagnosis not present

## 2017-02-20 DIAGNOSIS — N183 Chronic kidney disease, stage 3 (moderate): Secondary | ICD-10-CM | POA: Diagnosis not present

## 2017-02-20 DIAGNOSIS — I129 Hypertensive chronic kidney disease with stage 1 through stage 4 chronic kidney disease, or unspecified chronic kidney disease: Secondary | ICD-10-CM | POA: Diagnosis not present

## 2017-02-21 ENCOUNTER — Encounter (HOSPITAL_COMMUNITY): Payer: Self-pay | Admitting: Emergency Medicine

## 2017-02-21 ENCOUNTER — Emergency Department (HOSPITAL_COMMUNITY): Payer: Medicare HMO

## 2017-02-21 ENCOUNTER — Emergency Department (HOSPITAL_COMMUNITY)
Admission: EM | Admit: 2017-02-21 | Discharge: 2017-02-21 | Disposition: A | Discharge status: Home / Self Care | Payer: Medicare HMO | Attending: Emergency Medicine | Admitting: Emergency Medicine

## 2017-02-21 DIAGNOSIS — Y990 Civilian activity done for income or pay: Secondary | ICD-10-CM | POA: Diagnosis not present

## 2017-02-21 DIAGNOSIS — Z79899 Other long term (current) drug therapy: Secondary | ICD-10-CM | POA: Diagnosis not present

## 2017-02-21 DIAGNOSIS — Y9289 Other specified places as the place of occurrence of the external cause: Secondary | ICD-10-CM | POA: Insufficient documentation

## 2017-02-21 DIAGNOSIS — F17210 Nicotine dependence, cigarettes, uncomplicated: Secondary | ICD-10-CM | POA: Insufficient documentation

## 2017-02-21 DIAGNOSIS — N183 Chronic kidney disease, stage 3 (moderate): Secondary | ICD-10-CM | POA: Diagnosis not present

## 2017-02-21 DIAGNOSIS — I129 Hypertensive chronic kidney disease with stage 1 through stage 4 chronic kidney disease, or unspecified chronic kidney disease: Secondary | ICD-10-CM | POA: Insufficient documentation

## 2017-02-21 DIAGNOSIS — J45909 Unspecified asthma, uncomplicated: Secondary | ICD-10-CM | POA: Diagnosis not present

## 2017-02-21 DIAGNOSIS — Y9389 Activity, other specified: Secondary | ICD-10-CM | POA: Insufficient documentation

## 2017-02-21 DIAGNOSIS — M545 Low back pain, unspecified: Secondary | ICD-10-CM

## 2017-02-21 DIAGNOSIS — X500XXA Overexertion from strenuous movement or load, initial encounter: Secondary | ICD-10-CM | POA: Diagnosis not present

## 2017-02-21 LAB — CBC
HCT: 31.7 % — ABNORMAL LOW (ref 36.0–46.0)
Hemoglobin: 11 g/dL — ABNORMAL LOW (ref 12.0–15.0)
MCH: 28 pg (ref 26.0–34.0)
MCHC: 34.7 g/dL (ref 30.0–36.0)
MCV: 80.7 fL (ref 78.0–100.0)
PLATELETS: 170 10*3/uL (ref 150–400)
RBC: 3.93 MIL/uL (ref 3.87–5.11)
RDW: 14.2 % (ref 11.5–15.5)
WBC: 6.2 10*3/uL (ref 4.0–10.5)

## 2017-02-21 LAB — BASIC METABOLIC PANEL
Anion gap: 6 (ref 5–15)
BUN: 23 mg/dL — ABNORMAL HIGH (ref 6–20)
CALCIUM: 8.8 mg/dL — AB (ref 8.9–10.3)
CO2: 23 mmol/L (ref 22–32)
CREATININE: 1.28 mg/dL — AB (ref 0.44–1.00)
Chloride: 109 mmol/L (ref 101–111)
GFR, EST AFRICAN AMERICAN: 49 mL/min — AB (ref >60)
GFR, EST NON AFRICAN AMERICAN: 42 mL/min — AB (ref >60)
GLUCOSE: 87 mg/dL (ref 65–99)
Potassium: 4.4 mmol/L (ref 3.5–5.1)
Sodium: 138 mmol/L (ref 135–145)

## 2017-02-21 LAB — URINALYSIS, ROUTINE W REFLEX MICROSCOPIC
Bilirubin Urine: NEGATIVE
GLUCOSE, UA: NEGATIVE mg/dL
Hgb urine dipstick: NEGATIVE
Ketones, ur: NEGATIVE mg/dL
Leukocytes, UA: NEGATIVE
NITRITE: NEGATIVE
PROTEIN: 100 mg/dL — AB
Specific Gravity, Urine: 1.009 (ref 1.005–1.030)
pH: 5 (ref 5.0–8.0)

## 2017-02-21 MED ORDER — HYDROCODONE-ACETAMINOPHEN 5-325 MG PO TABS: 1.0000 | ORAL_TABLET | ORAL | 0 refills | Status: DC | PRN

## 2017-02-21 MED ORDER — CYCLOBENZAPRINE HCL 10 MG PO TABS: 10.0000 mg | ORAL_TABLET | Freq: Two times a day (BID) | ORAL | 0 refills | Status: DC | PRN

## 2017-02-21 MED ORDER — HYDROCODONE-ACETAMINOPHEN 5-325 MG PO TABS
1.0000 | ORAL_TABLET | ORAL | Status: AC
  Administered 2017-02-21: 1 via ORAL
  Filled 2017-02-21: qty 1

## 2017-02-21 NOTE — ED Provider Notes (Signed)
[place] DEPT Provider Note   CSN: [id number] Arrival date & time: 02/21/17  1221     History   Chief Complaint Chief Complaint  Patient presents with  . Back Pain    HPI Jennifer Foster is a 71 y.o. female.  The history is provided by the patient.  Back Pain   This is a new problem. The current episode started 2 days ago. The problem occurs constantly. The problem has not changed since onset.Associated with: pt has started working recently, mopping, lifting buckets. Quality: sharp. The pain does not radiate. The pain is moderate. The symptoms are aggravated by bending and twisting. Pertinent negatives include no chest pain, no fever, no abdominal pain, no dysuria, no tingling and no weakness.    Past Medical History:  Diagnosis Date  . Acute blood loss anemia 06/26/2015  . Acute blood loss anemia 06/26/2015  . Asthma   . Chronic kidney disease (CKD), stage III (moderate) 06/27/2015  . DIVERTICULAR BLEEDING, HX OF 10/14/2007   Colonoscopy 2008 showed diverticulosis.   [name] GERD (gastroesophageal reflux disease)   . Gout   . History of anemia   . History of asthma   . Hyperlipemia   . Hypertension   . Joint pain   . Lipoma   . SOB (shortness of breath) 03/18/2016    Patient Active Problem List   Diagnosis Date Noted  . Loss of weight 01/30/2017  . Dizziness 11/25/2016  . Dermatitis 08/15/2016  . Proteinuria 01/11/2016  . Anxiety and depression 12/25/2015  . Chronic kidney disease (CKD), stage III (moderate) 06/27/2015  . Cocaine abuse   . Headache 02/20/2015  . Gout 02/28/2014  . Left knee pain 10/12/2013  . Lipoma 03/19/2011  . Hyperlipidemia with target LDL less than 100 01/01/2011  . ANEMIA 11/23/2009  . TOBACCO USER 09/03/2009  . Generalized anxiety disorder 08/13/2009  . HYPERTENSION, BENIGN ESSENTIAL 10/14/2007    Past Surgical History:  Procedure Laterality Date  . ABDOMINAL HYSTERECTOMY  1981   partial, per pt history  . COLONOSCOPY N/A  12/08/2013   Procedure: COLONOSCOPY;  Surgeon: [name], MD;  Location: [facility];  Service: Endoscopy;  Laterality: N/A;  . COLONOSCOPY N/A 06/28/2015   Procedure: COLONOSCOPY;  Surgeon: Clarene Essex, MD;  Location[facility] ENDOSCOPY;  Service: Endoscopy;  Laterality: N/A;  . ESOPHAGOGASTRODUODENOSCOPY N/A 12/08/2013   Procedure: ESOPHAGOGASTRODUODENOSCOPY (EGD);  Surgeon: [name], MD;  Location: [facility] ENDOSCOPY;  Service: Endoscopy;  Laterality: N/A;  . GIVENS CAPSULE STUDY N/A 12/08/2013   Procedure: GIVENS CAPSULE STUDY;  Surgeon: [name], MD;  Location: [place];  Service: Endoscopy;  Laterality: N/A;  . LIPOMA EXCISION  01/28/11   neck    OB History    No data available       Home Medications    Prior to Admission medications   Medication Sig Start Date End Date Taking? Authorizing Provider  amLODipine (NORVASC) 10 MG tablet take 1 tablet by mouth once daily 12/29/16  Yes Eniola, Tawanna Solo T, MD  hydrALAZINE (APRESOLINE) 25 MG tablet take 1 tablet by mouth twice a day 02/17/17  Yes Eniola, Kehinde T, MD  irbesartan (AVAPRO) 150 MG tablet Take 150 mg by mouth daily. 02/20/17  Yes [provider]  rosuvastatin (CRESTOR) 20 MG tablet take 1 tablet by mouth at bedtime 07/18/16  Yes Eniola, Phill Myron, MD  cyclobenzaprine (FLEXERIL) 10 MG tablet Take 1 tablet (10 mg total) by mouth 2 (two) times daily as needed for muscle  spasms. 02/21/17   Dorie Rank, MD  escitalopram (LEXAPRO) 10 MG tablet Take 1 tablet (10 mg total) by mouth daily. Patient not taking: Reported on 08/15/2016 03/18/16   [name], MD  HYDROcodone-acetaminophen (NORCO/VICODIN) 5-325 MG tablet Take 1 tablet by mouth every 4 (four) hours as needed. 02/21/17   Dorie Rank, MD  nicotine (NICODERM CQ - DOSED IN MG/24 HR) 7 mg/24hr patch PLACE 1 PATCH (7 MG TOTAL) ONTO THE SKIN DAILY Patient not taking: Reported on 02/21/2017 02/09/17   [name], MD    Family History Family History  Problem  Relation Age of Onset  . Diabetes type II Unknown   . Kidney failure Unknown   . Kidney failure Son     Social History Social History  Substance Use Topics  . Smoking status: Current Some Day Smoker    Packs/day: 0.25    Types: Cigarettes  . Smokeless tobacco: Never Used  . Alcohol use 0.0 oz/week     Comment: occasionally     Allergies   Ace inhibitors; Lisinopril; and Tramadol   Review of Systems Review of Systems  Constitutional: Negative for fever.  Cardiovascular: Negative for chest pain.  Gastrointestinal: Negative for abdominal pain.  Genitourinary: Negative for dysuria.  Musculoskeletal: Positive for back pain.  Neurological: Negative for tingling and weakness.  All other systems reviewed and are negative.    Physical Exam Updated Vital Signs BP (!) 143/55 (BP Location: Left Arm)   Pulse 66   Temp 98 F (36.7 C) (Oral)   Resp 18   Ht 1.626 m (5\' 4" )   Wt 63.5 kg (140 lb)   SpO2 95%   BMI 24.03 kg/m   Physical Exam  Constitutional: She appears well-developed and well-nourished. No distress.  HENT:  Head: Normocephalic and atraumatic.  Right Ear: External ear normal.  Left Ear: External ear normal.  Eyes: Conjunctivae are normal. Right eye exhibits no discharge. Left eye exhibits no discharge. No scleral icterus.  Neck: Neck supple. No tracheal deviation present.  Cardiovascular: Normal rate, regular rhythm and intact distal pulses.   Pulmonary/Chest: Effort normal and breath sounds normal. No stridor. No respiratory distress. She has no wheezes. She has no rales.  Abdominal: Soft. Bowel sounds are normal. She exhibits no distension. There is no tenderness. There is no rebound and no guarding.  Musculoskeletal: She exhibits no edema.       Lumbar back: She exhibits tenderness and bony tenderness. She exhibits no swelling.  Neurological: She is alert. She has normal strength. No cranial nerve deficit (no facial droop, extraocular movements intact, no  slurred speech) or sensory deficit. She exhibits normal muscle tone. She displays no seizure activity. Coordination normal.  Skin: Skin is warm and dry. No rash noted.  Psychiatric: She has a normal mood and affect.  Nursing note and vitals reviewed.    ED Treatments / Results  Labs (all labs ordered are listed, but only abnormal results are displayed) Labs Reviewed  URINALYSIS, ROUTINE W REFLEX MICROSCOPIC - Abnormal; Notable for the following:       Result Value   Protein, ur 100 (*)    Bacteria, UA RARE (*)    Squamous Epithelial / LPF 0-5 (*)    All other components within normal limits  CBC - Abnormal; Notable for the following:    Hemoglobin 11.0 (*)    HCT 31.7 (*)    All other components within normal limits  BASIC METABOLIC PANEL - Abnormal; Notable for the  following:    BUN 23 (*)    Creatinine, Ser 1.28 (*)    Calcium 8.8 (*)    GFR calc non Af Amer 42 (*)    GFR calc Af Amer 49 (*)    All other components within normal limits     Radiology Dg Lumbar Spine Complete  Result Date: 02/21/2017 CLINICAL DATA:  71 year old female with lumbar back pain EXAM: LUMBAR SPINE - COMPLETE 4+ VIEW COMPARISON:  None. FINDINGS: Lumbar Spine: Lumbar vertebral elements maintain normal alignment without evidence of anterolisthesis, retrolisthesis, subluxation. No fracture line identified. Vertebral body heights maintained. Developing degenerative disc disease of L4-L5 and L5-S1 with early disc space loss and endplate changes. Oblique images demonstrate no displaced pars defect. Calcifications of the abdominal aorta. IMPRESSION: Negative for acute fracture or malalignment of the lumbar spine. Early degenerative disc disease of L4 through S1. Aortic atherosclerosis Electronically Signed   By: Corrie Mckusick D.O.   On: 02/21/2017 13:42    Procedures Procedures (including critical care time)  Medications Ordered in ED Medications  HYDROcodone-acetaminophen (NORCO/VICODIN) 5-325 MG per  tablet 1 tablet (1 tablet Oral Given 02/21/17 1318)     Initial Impression / Assessment and Plan / ED Course  I have reviewed the triage vital signs and the nursing notes.  Pertinent labs & imaging results that were available during my care of the patient were reviewed by me and considered in my medical decision making (see chart for details).   patient presented to the emergency room with low back pain. Patient noted the pain increased with certain movements and positions. She denies any abdominal pain. No mass palpated on exam.  Laboratory tests and x-rays are reassuring. I suspect her symptoms are related to musculoskeletal strain.  Will dc home with pain meds.  Final Clinical Impressions(s) / ED Diagnoses   Final diagnoses:  Acute left-sided low back pain without sciatica    New Prescriptions New Prescriptions   CYCLOBENZAPRINE (FLEXERIL) 10 MG TABLET    Take 1 tablet (10 mg total) by mouth 2 (two) times daily as needed for muscle spasms.   HYDROCODONE-ACETAMINOPHEN (NORCO/VICODIN) 5-325 MG TABLET    Take 1 tablet by mouth every 4 (four) hours as needed.     Dorie Rank, MD 02/21/17 [phone]

## 2017-02-21 NOTE — Discharge Instructions (Signed)
Take the hydrocodone for more severe pain, he can take over-the-counter medications for less severe pain but be careful about her total Tylenol intake.  Follow-up with a primary care doctor if the symptoms are not improving over the next week.

## 2017-02-21 NOTE — ED Triage Notes (Addendum)
Pt c/o lower back pain on the left side x2 days. 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 Hx of kidney issues.

## 2017-03-06 ENCOUNTER — Encounter: Payer: Self-pay | Admitting: Family Medicine

## 2017-03-06 ENCOUNTER — Ambulatory Visit (INDEPENDENT_AMBULATORY_CARE_PROVIDER_SITE_OTHER): Payer: Medicare HMO | Admitting: Family Medicine

## 2017-03-06 ENCOUNTER — Ambulatory Visit (HOSPITAL_COMMUNITY)
Admission: RE | Admit: 2017-03-06 | Discharge: 2017-03-06 | Disposition: A | Discharge status: Home / Self Care | Payer: Medicare HMO | Source: Ambulatory Visit | Attending: Family Medicine | Admitting: Family Medicine

## 2017-03-06 VITALS — BP 124/64 | HR 75 | Temp 98.4°F | Ht 64.0 in | Wt 143.6 lb

## 2017-03-06 DIAGNOSIS — Z720 Tobacco use: Secondary | ICD-10-CM | POA: Diagnosis not present

## 2017-03-06 DIAGNOSIS — I10 Essential (primary) hypertension: Secondary | ICD-10-CM | POA: Diagnosis not present

## 2017-03-06 DIAGNOSIS — R634 Abnormal weight loss: Secondary | ICD-10-CM

## 2017-03-06 DIAGNOSIS — I499 Cardiac arrhythmia, unspecified: Secondary | ICD-10-CM | POA: Insufficient documentation

## 2017-03-06 DIAGNOSIS — F17200 Nicotine dependence, unspecified, uncomplicated: Secondary | ICD-10-CM

## 2017-03-06 DIAGNOSIS — M545 Low back pain, unspecified: Secondary | ICD-10-CM

## 2017-03-06 DIAGNOSIS — Z122 Encounter for screening for malignant neoplasm of respiratory organs: Secondary | ICD-10-CM | POA: Diagnosis not present

## 2017-03-06 NOTE — Assessment & Plan Note (Signed)
She is stable on Norvasc, Hydralazine and Avapro  150 mg qd was added on by her nephrologist. Monitor BP closely.

## 2017-03-06 NOTE — Assessment & Plan Note (Signed)
Rapidly improving. No red flag signs. Xray done at the ED reviewed : Mild DJD. Continue tylenol as needed for pain.  She was also prescribed flexeril. Monitor for improvement.

## 2017-03-06 NOTE — Progress Notes (Signed)
Subjective:     Patient ID: Jennifer Foster, female   DOB: 1946-04-12, 71 y.o.   MRN: [id number]  HPI HTN: here for follow-up. She is compliant with her medication. She was started on Avapro by her nephrologist. Denies any other concern. She denies SOB, no palpitation, no chest pain. Back pain:She was recently seen at the ED for acute back pain which has since improved on medication. 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 She enjoy her new work. Feels well otherwise. Weight lose:She has improved on her diet. She is here for follow-up. Smoke: Patient still working on cutting back on smoking. She smoked for more than 30 yrs. She had cut back to 3-4 sticks per day. She denies cough, no SOB, no wheezing.  Current Outpatient Prescriptions on File Prior to Visit  Medication Sig Dispense Refill  . amLODipine (NORVASC) 10 MG tablet take 1 tablet by mouth once daily 90 tablet 1  . cyclobenzaprine (FLEXERIL) 10 MG tablet Take 1 tablet (10 mg total) by mouth 2 (two) times daily as needed for muscle spasms. 20 tablet 0  . hydrALAZINE (APRESOLINE) 25 MG tablet take 1 tablet by mouth twice a day 180 tablet 2  . HYDROcodone-acetaminophen (NORCO/VICODIN) 5-325 MG tablet Take 1 tablet by mouth every 4 (four) hours as needed. 12 tablet 0  . irbesartan (AVAPRO) 150 MG tablet Take 150 mg by mouth daily.  0  . rosuvastatin (CRESTOR) 20 MG tablet take 1 tablet by mouth at bedtime 90 tablet 1  . escitalopram (LEXAPRO) 10 MG tablet Take 1 tablet (10 mg total) by mouth daily. (Patient not taking: Reported on 08/15/2016) 30 tablet 1  . nicotine (NICODERM CQ - DOSED IN MG/24 HR) 7 mg/24hr patch PLACE 1 PATCH (7 MG TOTAL) ONTO THE SKIN DAILY (Patient not taking: Reported on 02/21/2017) 28 patch 0   No current facility-administered medications on file prior to visit.    Past Medical History:  Diagnosis Date  . Acute blood loss  anemia 06/26/2015  . Acute blood loss anemia 06/26/2015  . Asthma   . Chronic kidney disease (CKD), stage III (moderate) 06/27/2015  . DIVERTICULAR BLEEDING, HX OF 10/14/2007   Colonoscopy 2008 showed diverticulosis.   [name] GERD (gastroesophageal reflux disease)   . Gout   . History of anemia   . History of asthma   . Hyperlipemia   . Hypertension   . Joint pain   . Lipoma   . SOB (shortness of breath) 03/18/2016     Review of Systems  Respiratory: Negative.   Cardiovascular: Negative.   Gastrointestinal: Negative.   Genitourinary: Negative.   All other systems reviewed and are negative.      Objective:   Physical Exam  Constitutional: She is oriented to person, place, and time. She appears well-developed. No distress.  Cardiovascular: Normal rate and normal heart sounds.  An irregularly irregular rhythm present.  No murmur heard. Pulmonary/Chest: Effort normal and breath sounds normal. No respiratory distress. She has no wheezes.  Abdominal: Soft. Bowel sounds are normal. She exhibits no distension and no mass. There is no tenderness.  Musculoskeletal: Normal range of motion. She exhibits no edema.  Neurological: She is alert and oriented to person, place, and time. No cranial nerve deficit.  Nursing note and vitals reviewed.      Assessment:     HTN Irregular heart beat Acute back  pain Weight loss Tobacco smoking    Plan:     Check problem list.

## 2017-03-06 NOTE — Assessment & Plan Note (Signed)
Heart rhythm intermittently irregular. EKG showed normal sinus with T wave inversion on lateral leads almost similar to EKG done in 2016. She is currently asymptomatic. Holter monitor ordered to further assess. Return precaution given.

## 2017-03-06 NOTE — Assessment & Plan Note (Signed)
Cessation counseling done. Low dose CT chest ordered to screen for lung cancer in a smoker with unintended weight loss. I will contact her soon with result.

## 2017-03-06 NOTE — Assessment & Plan Note (Signed)
Recent TSH normal. She gained 3 lbs since last visit. Continue to monitor.

## 2017-03-06 NOTE — Patient Instructions (Signed)
It was nice seeing you today. Congratulation on your new job, I am so proud of you. You have also gained 3 lbs since last visit. Good job on that. We will continue to work on smoking cessation, in the mean time, I will screen you for lung cancer. Your heart beat was irregular today. We will get an Holter monitor to assess.

## 2017-03-16 ENCOUNTER — Ambulatory Visit (INDEPENDENT_AMBULATORY_CARE_PROVIDER_SITE_OTHER): Payer: Medicare HMO

## 2017-03-16 DIAGNOSIS — I499 Cardiac arrhythmia, unspecified: Secondary | ICD-10-CM | POA: Diagnosis not present

## 2017-03-17 ENCOUNTER — Telehealth: Payer: Self-pay | Admitting: Family Medicine

## 2017-03-17 NOTE — Telephone Encounter (Signed)
Contacted pt and she stated she went to [facility] after she left our office this am and they told her she can take monitor off herself and return to them tomm around 1230pm. Pt has no further concerns and will FU as needed.

## 2017-03-17 NOTE — Telephone Encounter (Signed)
Patient was told by the heart care place that her appt here at [facility] was on 03/17/17 but I informed her it is on 04/17/17 (which looks correct according to appt note).  She was irate because she thought her monitor was supposed to come off in two days, just fyi.

## 2017-03-17 NOTE — Telephone Encounter (Signed)
It is supposed to be a 48 hour monitor. Did they not give her instruction when to return the monitor. The 03/17/17 might be f/u at [facility]. Please double check on this. Thanks.

## 2017-03-20 ENCOUNTER — Ambulatory Visit (HOSPITAL_COMMUNITY): Payer: Medicare HMO | Attending: Family Medicine

## 2017-03-30 ENCOUNTER — Telehealth: Payer: Self-pay | Admitting: Family Medicine

## 2017-03-30 NOTE — Telephone Encounter (Signed)
Holter monitor result discussed with patient as reported by Dr. Lauro Regulus, Lynnell Dike, MD 03/27/2017 Routine  Narrative & Impression      NSR with PAC's and rare PVC's  Brief SVT up to 12 beats  No pauses > 3 seconds.  HR range 45 to 130 bpm with AVE 81 bpm  8% of all beats are PAC's  No diary entries.   NSR with PAC's Brief short SVT Overall, unremarkable

## 2017-03-31 ENCOUNTER — Encounter: Payer: Self-pay | Admitting: Family Medicine

## 2017-03-31 ENCOUNTER — Ambulatory Visit (INDEPENDENT_AMBULATORY_CARE_PROVIDER_SITE_OTHER): Payer: Medicare HMO | Admitting: Family Medicine

## 2017-03-31 ENCOUNTER — Telehealth: Payer: Self-pay

## 2017-03-31 VITALS — BP 110/59 | HR 64 | Temp 98.2°F | Wt 147.0 lb

## 2017-03-31 DIAGNOSIS — Z122 Encounter for screening for malignant neoplasm of respiratory organs: Secondary | ICD-10-CM | POA: Diagnosis not present

## 2017-03-31 DIAGNOSIS — R634 Abnormal weight loss: Secondary | ICD-10-CM

## 2017-03-31 DIAGNOSIS — M549 Dorsalgia, unspecified: Secondary | ICD-10-CM

## 2017-03-31 DIAGNOSIS — I10 Essential (primary) hypertension: Secondary | ICD-10-CM | POA: Diagnosis not present

## 2017-03-31 DIAGNOSIS — M62838 Other muscle spasm: Secondary | ICD-10-CM | POA: Insufficient documentation

## 2017-03-31 DIAGNOSIS — R252 Cramp and spasm: Secondary | ICD-10-CM | POA: Diagnosis not present

## 2017-03-31 MED ORDER — NAPROXEN 500 MG PO TABS: 500.0000 mg | ORAL_TABLET | Freq: Two times a day (BID) | ORAL | 2 refills | Status: DC

## 2017-03-31 MED ORDER — CYCLOBENZAPRINE HCL 5 MG PO TABS: 5.0000 mg | ORAL_TABLET | Freq: Two times a day (BID) | ORAL | 0 refills | Status: DC | PRN

## 2017-03-31 NOTE — Assessment & Plan Note (Signed)
Recent xray of her lumbar spine showed early DJD. No neurologic deficit. NSAID prescribed for pain (Naproxen). Home back exercise instruction given. Consider PT in the future if no improvement.

## 2017-03-31 NOTE — Progress Notes (Signed)
Subjective:     Patient ID: Jennifer Foster, female   DOB: [birth year], 71 y.o.   MRN: [id number]  HPI Back pain: C/O lower back pain mostly to the left, pain does not radiate any where. She denies numbness or weakness of her extremities. No recent injury to her back. Pain is about 8/10 in severity. At times she has difficulty getting out of her bed. She need pain meds. Leg cramp: C/O right lower limb cramping on and off, worse at night. She denies any trauma or leg injury. She denies any leg swelling.  HTN: She is compliant with her meds. Here for follow-up. Weight loss:She is doing well with her diet. She is here for follow-up.  Current Outpatient Prescriptions on File Prior to Visit  Medication Sig Dispense Refill  . amLODipine (NORVASC) 10 MG tablet take 1 tablet by mouth once daily 90 tablet 1  . hydrALAZINE (APRESOLINE) 25 MG tablet take 1 tablet by mouth twice a day 180 tablet 2  . irbesartan (AVAPRO) 150 MG tablet Take 150 mg by mouth daily.  0  . rosuvastatin (CRESTOR) 20 MG tablet take 1 tablet by mouth at bedtime 90 tablet 1  . cyclobenzaprine (FLEXERIL) 10 MG tablet Take 1 tablet (10 mg total) by mouth 2 (two) times daily as needed for muscle spasms. (Patient not taking: Reported on 03/31/2017) 20 tablet 0  . nicotine (NICODERM CQ - DOSED IN MG/24 HR) 7 mg/24hr patch PLACE 1 PATCH (7 MG TOTAL) ONTO THE SKIN DAILY (Patient not taking: Reported on 02/21/2017) 28 patch 0   No current facility-administered medications on file prior to visit.    Past Medical History:  Diagnosis Date  . Acute blood loss anemia 06/26/2015  . Acute blood loss anemia 06/26/2015  . Asthma   . Chronic kidney disease (CKD), stage III (moderate) 06/27/2015  . DIVERTICULAR BLEEDING, HX OF 10/14/2007   Colonoscopy 2008 showed diverticulosis.   [name] GERD (gastroesophageal reflux disease)   . Gout   . History of anemia   . History of asthma   . Hyperlipemia   . Hypertension   . Joint pain   . Lipoma   . SOB  (shortness of breath) 03/18/2016     Review of Systems  Respiratory: Negative.   Cardiovascular: Negative.   Gastrointestinal: Negative.   Musculoskeletal: Positive for back pain. Negative for gait problem and joint swelling.  Neurological: Negative.   All other systems reviewed and are negative.      Vitals:   03/31/17 0825  BP: (!) 110/59  Pulse: 64  Temp: 98.2 F (36.8 C)  TempSrc: Oral  SpO2: 97%  Weight: 147 lb (66.7 kg)    Objective:   Physical Exam  Constitutional: She is oriented to person, place, and time. She appears well-developed. No distress.  Cardiovascular: Normal rate, regular rhythm and normal heart sounds.   No murmur heard. Pulmonary/Chest: Effort normal and breath sounds normal. No respiratory distress. She has no wheezes.  Abdominal: Soft. Bowel sounds are normal. She exhibits no distension and no mass. There is no tenderness.  Musculoskeletal: Normal range of motion. She exhibits no edema.       Lumbar back: Normal.  No calf swelling or tenderness B/L  Neurological: She is alert and oriented to person, place, and time. She has normal strength and normal reflexes. No cranial nerve deficit or sensory deficit.  Psychiatric: She has a normal mood and affect.  Nursing note and vitals reviewed.      Assessment:  Black pain: Leg cramp: HTN: Weight loss:    Plan:     Check problem list

## 2017-03-31 NOTE — Assessment & Plan Note (Signed)
BP pretty low today. She endorsed some fatigue and occasional dizziness at home. She was doing well on Norvasc and Hydralazine alone. Recently her Neprhologist added Irbesartan to her BP regimen for renal protection. Patient advised to hold hydralazine for now and continue Norvasc and ARBs. F/U in 2 weeks for BP reassessment.

## 2017-03-31 NOTE — Assessment & Plan Note (Signed)
She has gained 3 lbs since last visit. She is excited about this and I congratulated her on it. Still need to obtain CT chest to screen for lung cancer given hx of tobacco smoking. CT chest reordered today. Nursing will schedule.

## 2017-03-31 NOTE — Patient Instructions (Signed)

## 2017-03-31 NOTE — Telephone Encounter (Signed)
Pt contacted and scheduled for pcp follow up for 8/14. Pt was made aware of her scheduled CT apt for 8/3 at 12pm at [facility]. Pt wrote down all apts and verbalized understanding. Just an FYI.

## 2017-03-31 NOTE — Assessment & Plan Note (Signed)
Likely due to dehydration. Currently asymptomatic. No sign suggestive of DVT on physical exam. Naproxen as needed for pain. Keep well hydrated. F/U soon if no improvement or if symptoms worsens.

## 2017-03-31 NOTE — Telephone Encounter (Signed)
-----   Message from [name], MD sent at 03/31/2017  2:20 PM EDT ----- Please contact patient to schedule 2 weeks f/u appointment with me for BP check. Thanks.

## 2017-04-03 ENCOUNTER — Ambulatory Visit (HOSPITAL_COMMUNITY)
Admission: RE | Admit: 2017-04-03 | Discharge: 2017-04-03 | Disposition: A | Discharge status: Home / Self Care | Payer: Medicare HMO | Source: Ambulatory Visit | Attending: Family Medicine | Admitting: Family Medicine

## 2017-04-03 ENCOUNTER — Telehealth: Payer: Self-pay | Admitting: Family Medicine

## 2017-04-03 DIAGNOSIS — I712 Thoracic aortic aneurysm, without rupture, unspecified: Secondary | ICD-10-CM

## 2017-04-03 DIAGNOSIS — J439 Emphysema, unspecified: Secondary | ICD-10-CM | POA: Insufficient documentation

## 2017-04-03 DIAGNOSIS — I2510 Atherosclerotic heart disease of native coronary artery without angina pectoris: Secondary | ICD-10-CM | POA: Insufficient documentation

## 2017-04-03 DIAGNOSIS — E042 Nontoxic multinodular goiter: Secondary | ICD-10-CM | POA: Diagnosis not present

## 2017-04-03 DIAGNOSIS — I700 Atherosclerosis of aorta: Secondary | ICD-10-CM | POA: Insufficient documentation

## 2017-04-03 DIAGNOSIS — Z122 Encounter for screening for malignant neoplasm of respiratory organs: Secondary | ICD-10-CM | POA: Diagnosis not present

## 2017-04-03 DIAGNOSIS — R634 Abnormal weight loss: Secondary | ICD-10-CM | POA: Diagnosis not present

## 2017-04-03 HISTORY — DX: Thoracic aortic aneurysm, without rupture, unspecified: I71.20

## 2017-04-03 HISTORY — DX: Thoracic aortic aneurysm, without rupture: I71.2

## 2017-04-03 MED ORDER — IOPAMIDOL (ISOVUE-300) INJECTION 61%
75.0000 mL | Freq: Once | INTRAVENOUS | Status: AC | PRN
  Administered 2017-04-03: 75 mL via INTRAVENOUS

## 2017-04-03 MED ORDER — IOPAMIDOL (ISOVUE-300) INJECTION 61%
INTRAVENOUS | Status: AC
  Filled 2017-04-03: qty 75

## 2017-04-03 NOTE — Telephone Encounter (Signed)
Attempted to contact pt to schedule pcp apt in 1-2weeks. The number available rang and rang and no option for VM. I will continue to try. If pt calls back please schedule here with pcp.

## 2017-04-03 NOTE — Telephone Encounter (Signed)
CT result discussed with patient.  Will like her to see me soon for further discussion. Recent TSH has been fine.     IMPRESSION: 1. No suspicious pulmonary nodule or mass. 2. 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 Recommend annual imaging followup by CTA or MRA. This recommendation follows 2010 ACCF/AHA/AATS/ACR/ASA/SCA/SCAI/SIR/STS/SVM Guidelines for the Diagnosis and Management of Patients with Thoracic Aortic Disease. Circulation. 2010; 121: F207-K182 3. Coronary artery calcifications. 4. Mild centrilobular pulmonary emphysema. 5. Multinodular thyroid goiter. 6. Left adrenal thickening likely representing hyperplasia and 1.9 cm right adrenal adenoma.

## 2017-04-06 NOTE — Telephone Encounter (Signed)
Patient has an appointment on 04-14-17. Jazmin Hartsell,CMA

## 2017-04-08 DIAGNOSIS — Z720 Tobacco use: Secondary | ICD-10-CM | POA: Diagnosis not present

## 2017-04-08 DIAGNOSIS — I129 Hypertensive chronic kidney disease with stage 1 through stage 4 chronic kidney disease, or unspecified chronic kidney disease: Secondary | ICD-10-CM | POA: Diagnosis not present

## 2017-04-08 DIAGNOSIS — N183 Chronic kidney disease, stage 3 (moderate): Secondary | ICD-10-CM | POA: Diagnosis not present

## 2017-04-14 ENCOUNTER — Encounter: Payer: Self-pay | Admitting: Family Medicine

## 2017-04-14 ENCOUNTER — Ambulatory Visit (INDEPENDENT_AMBULATORY_CARE_PROVIDER_SITE_OTHER): Payer: Medicare HMO | Admitting: Family Medicine

## 2017-04-14 VITALS — BP 122/58 | HR 69 | Temp 97.8°F | Ht 64.0 in | Wt 149.0 lb

## 2017-04-14 DIAGNOSIS — I712 Thoracic aortic aneurysm, without rupture, unspecified: Secondary | ICD-10-CM

## 2017-04-14 DIAGNOSIS — E042 Nontoxic multinodular goiter: Secondary | ICD-10-CM | POA: Diagnosis not present

## 2017-04-14 DIAGNOSIS — I714 Abdominal aortic aneurysm, without rupture, unspecified: Secondary | ICD-10-CM

## 2017-04-14 DIAGNOSIS — D3500 Benign neoplasm of unspecified adrenal gland: Secondary | ICD-10-CM

## 2017-04-14 DIAGNOSIS — I10 Essential (primary) hypertension: Secondary | ICD-10-CM | POA: Diagnosis not present

## 2017-04-14 NOTE — Assessment & Plan Note (Signed)
BP looks good on current regimen. May d/c hydralazine.

## 2017-04-14 NOTE — Patient Instructions (Signed)
Goiter A goiter is an enlarged thyroid gland. The thyroid gland is located in the lower front of the neck. The gland produces hormones that regulate mood, body temperature, pulse rate, and digestion. Most goiters are painless and are not a cause for serious concern. Goiters and conditions that cause goiters can be treated, if necessary. What are the causes? Causes of this condition include:  Diseases that attack healthy cells in your body (autoimmune diseases) and affect your thyroid function, such as: ? Graves disease. This causes too much thyroid hormone to be produced and it makes your thyroid overly active (hyperthyroidism). ? Hashimoto disease. This type of inflammation of the thyroid (thyroiditis) causes too little thyroid hormone to be produced and it makes your thyroid not active enough (hypothyroidism).  Other conditions that cause thyroiditis.  Nodular goiter. This means that there are one or more small growths on your thyroid. These can create too much thyroid hormone.  Pregnancy.  Thyroid cancer. This is rare.  Certain medicines.  Radiation exposure.  Iodine deficiency.  In some cases, the cause may not be known (idiopathic). What increases the risk? This condition is more likely to develop in:  People who have a family history of goiter.  Women.  People who do not get enough iodine in their diet.  People who are older than 84.  People who smoke tobacco.  What are the signs or symptoms? Common symptoms of this condition include:  Swelling in the lower part of the neck. This swelling can range from a very small bump to a large lump.  A tight feeling in the throat.  A hoarse voice.  Other symptoms include:  Coughing.  Wheezing.  Difficulty swallowing.  Difficulty breathing.  Bulging neck veins.  Dizziness.  In some cases, there are no symptoms and thyroid hormone levels may be normal. When a goiter is the result of hyperthyroidism, symptoms may  also include:  Nervousness or restlessness.  Inability to tolerate heat.  Unexplained weight loss.  Diarrhea.  Change in the texture of hair or skin.  Changes in heart beat, such as skipped beats, extra beats, or a rapid heart rate.  Loss of menstruation.  Shaky hands.  Increased appetite.  Sleep problems.  When a goiter is the result of hypothyroidism, symptoms may also include:  Feeling like you have no energy (lethargy).  Inability to tolerate cold.  Weight gain that is not explained by a change in diet or exercise habits.  Dry skin.  Coarse hair.  Menstrual irregularity.  Constipation.  Sadness or depression.  How is this diagnosed? This condition may be diagnosed with a medical history and physical exam. You may also have other tests, including:  Blood tests to check thyroid function.  Imaging tests, such as: ? Ultrasonography. ? CT scan. ? MRI. ? Thyroid scan. You will be given a safe radioactive injection, then images will be taken of your thyroid.  Tissue sample (biopsy) of the goiter or any nodules. This checks to see if the goiter or nodules are cancerous.  How is this treated? Treatment for this condition depends on the cause. Treatment may include:  Medicines to control your thyroid.  Anti-inflammatory or steroid medicines, if inflammation is the cause.  Iodine supplements or changes in diet, if the goiter is caused by iodine deficiency.  Radiation therapy.  Surgery to remove your thyroid.  In some cases, no treatment is necessary, and your health care provider will monitor your condition at regular checkups. Follow these instructions at  home:  Follow recommendations from your health care provider for any changes to your diet.  Take over-the-counter and prescription medicines only as told by your health care provider.  Do not use any tobacco products, including cigarettes, chewing tobacco, or e-cigarettes. If you need help quitting,  ask your health care provider.  Keep all follow-up appointments as told by your health care provider. This is important. Contact a health care provider if:  Your symptoms do not get better with treatment. Get help right away if:  You develop sudden, unexplained confusion or other mental changes.  You have nausea, vomiting, or diarrhea.  You develop a fever.  Your skin or the whites of your eyes appear yellow (jaundice).  You develop chest pain.  You have trouble breathing or swallowing.  You suddenly become very weak.  You experience extreme restlessness. This information is not intended to replace advice given to you by your health care provider. Make sure you discuss any questions you have with your health care provider. Document Released: 02/05/2010 Document Revised: 03/07/2016 Document Reviewed: 08/14/2014 Elsevier Interactive Patient Education  2018 Elsevier Inc.  

## 2017-04-14 NOTE — Assessment & Plan Note (Signed)
Likely non-toxic. Recent TSh was normal. I rechecked TSH with free T3 and T4 as well as TPO. Thyroid US ordered. I will contact her soon with result.

## 2017-04-14 NOTE — Assessment & Plan Note (Signed)
Likely benign, incidental findings on CT chest. Also adrenal hyperplasia. Uncertain what to make out of this. I will look more into this and pursue diagnosis if necessary. Patient agreed with plan.

## 2017-04-14 NOTE — Progress Notes (Signed)
Subjective:     Patient ID: Jennifer Foster, female   DOB: 01-15-1946, 71 y.o.   MRN: [id number]  HPI HTN: Here for follow-up. She was advised to d/c hydralazine during last visit. She is compliant with Norvasc and Avapro. Feeling well otherwise. Multinodular goiter: denies any concern. Here to follow-up with CT result. 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 No GI symptom. Here to follow-up on CT result. Aortic aneurysm:Denies any symptoms. No abdominal or chest pain. Here to follow-up with her CT chest result.  Current Outpatient Prescriptions on File Prior to Visit  Medication Sig Dispense Refill  . amLODipine (NORVASC) 10 MG tablet take 1 tablet by mouth once daily 90 tablet 1  . cyclobenzaprine (FLEXERIL) 5 MG tablet Take 1 tablet (5 mg total) by mouth 2 (two) times daily as needed for muscle spasms. Avoid use when operating machinery 30 tablet 0  . irbesartan (AVAPRO) 150 MG tablet Take 150 mg by mouth daily.  0  . rosuvastatin (CRESTOR) 20 MG tablet take 1 tablet by mouth at bedtime 90 tablet 1  . hydrALAZINE (APRESOLINE) 25 MG tablet take 1 tablet by mouth twice a day (Patient not taking: Reported on 04/14/2017) 180 tablet 2  . naproxen (NAPROSYN) 500 MG tablet Take 1 tablet (500 mg total) by mouth 2 (two) times daily with a meal. (Patient not taking: Reported on 04/14/2017) 60 tablet 2  . nicotine (NICODERM CQ - DOSED IN MG/24 HR) 7 mg/24hr patch PLACE 1 PATCH (7 MG TOTAL) ONTO THE SKIN DAILY (Patient not taking: Reported on 02/21/2017) 28 patch 0   No current facility-administered medications on file prior to visit.    Past Medical History:  Diagnosis Date  . Acute blood loss anemia 06/26/2015  . Acute blood loss anemia 06/26/2015  . Asthma   . Chronic kidney disease (CKD), stage III (moderate) 06/27/2015  . DIVERTICULAR BLEEDING, HX OF 10/14/2007   Colonoscopy 2008 showed diverticulosis.   [name] GERD (gastroesophageal reflux disease)   . Gout   . History of anemia   . History of asthma    . Hyperlipemia   . Hypertension   . Joint pain   . Lipoma   . SOB (shortness of breath) 03/18/2016   Vitals:   04/14/17 1008  BP: (!) 122/58  Pulse: 69  Temp: 97.8 F (36.6 C)  TempSrc: Oral  Weight: 149 lb (67.6 kg)  Height: 5\' 4"  (1.626 m)      Review of Systems  Respiratory: Negative.   Cardiovascular: Negative.   Gastrointestinal: Negative.   Genitourinary: Negative.   Neurological: Negative.   All other systems reviewed and are negative.      Objective:   Physical Exam  Constitutional: She is oriented to person, place, and time. She appears well-developed. No distress.  Neck: Neck supple. No thyromegaly present.  Cardiovascular: Normal rate, regular rhythm and normal heart sounds.   No murmur heard. Pulmonary/Chest: Effort normal. No respiratory distress. She has no wheezes.  Abdominal: Soft. Bowel sounds are normal. She exhibits no distension and no mass. There is no tenderness. There is no guarding.  Musculoskeletal: Normal range of motion. She exhibits no edema.  Lymphadenopathy:    She has no cervical adenopathy.  Neurological: She is alert and oriented to person, place, and time.  Nursing note and vitals reviewed.      Assessment:     HTN Multinodular Goiter Adrenal Adenoma and hyperplasia Aortic aneurysm.    Plan:     Check problem list.

## 2017-04-14 NOTE — Assessment & Plan Note (Signed)
Currently asymptomatic. Result discussed with patient. Repeat CT or aortic US in 1 yr recommended. She verbalized understanding.

## 2017-04-15 LAB — T4, FREE: Free T4: 1.12 ng/dL (ref 0.82–1.77)

## 2017-04-15 LAB — TSH: TSH: 0.884 u[IU]/mL (ref 0.450–4.500)

## 2017-04-15 LAB — T3, FREE: T3 FREE: 2.8 pg/mL (ref 2.0–4.4)

## 2017-04-15 LAB — THYROID PEROXIDASE ANTIBODY: Thyroperoxidase Ab SerPl-aCnc: 19 IU/mL (ref 0–34)

## 2017-04-17 ENCOUNTER — Ambulatory Visit: Payer: Medicare HMO | Admitting: Family Medicine

## 2017-04-21 ENCOUNTER — Encounter: Payer: Self-pay | Admitting: Family Medicine

## 2017-04-21 ENCOUNTER — Ambulatory Visit (HOSPITAL_COMMUNITY)
Admission: RE | Admit: 2017-04-21 | Discharge: 2017-04-21 | Disposition: A | Discharge status: Home / Self Care | Payer: Medicare HMO | Source: Ambulatory Visit | Attending: Family Medicine | Admitting: Family Medicine

## 2017-04-21 ENCOUNTER — Telehealth: Payer: Self-pay | Admitting: Family Medicine

## 2017-04-21 DIAGNOSIS — E042 Nontoxic multinodular goiter: Secondary | ICD-10-CM | POA: Diagnosis not present

## 2017-04-21 NOTE — Addendum Note (Signed)
Addended by: Andrena Mews T on: 04/21/2017 04:34 PM   Modules accepted: Orders

## 2017-04-21 NOTE — Telephone Encounter (Signed)
I was unable to reach patient by phone to discuss her [place] result. I will mail letter to her home.

## 2017-04-22 ENCOUNTER — Other Ambulatory Visit: Payer: Self-pay | Admitting: Family Medicine

## 2017-04-22 DIAGNOSIS — E042 Nontoxic multinodular goiter: Secondary | ICD-10-CM

## 2017-04-22 NOTE — Telephone Encounter (Signed)
I tried to reach her again regarding thyroid US. I was unable to reach her. I called Phylis Olga Millers and advised her to inform her sister that we have been trying to reach her. She agreed to do so.

## 2017-04-22 NOTE — Telephone Encounter (Signed)
Patient returned my call. I discussed the need for Thyroid biopsy. She will receive a call soon for scheduling. All questions were answered.

## 2017-04-22 NOTE — Telephone Encounter (Signed)
Patient scheduled for august 29th at 3:10pm with  imaging, she can call them with any questions regarding the actual procedure.  Will call patient again tomorrow to inform her of the appointment. Jazmin Hartsell,CMA

## 2017-04-22 NOTE — Telephone Encounter (Signed)
Left message for the scheduling center to call me back in regards to [place]. I will await their return phone call. My direct line was given.

## 2017-04-22 NOTE — Telephone Encounter (Signed)
Tried to call patient to see what times worked well for her.  Scheduling center called back and the u/s thyroid bx has to be performed at [facility].  Will call them to see when patient can be seen. Jazmin Hartsell,CMA

## 2017-04-22 NOTE — Addendum Note (Signed)
Addended by: Andrena Mews T on: 04/22/2017 08:53 AM   Modules accepted: Orders

## 2017-04-23 NOTE — Telephone Encounter (Signed)
Attempted to call pt again to inform of apt time. No answer or option for VM.

## 2017-04-23 NOTE — Telephone Encounter (Signed)
Spoke with patient and made aware of appointment for biopsy.  Elainah Rhyne,CMA

## 2017-04-28 DIAGNOSIS — N183 Chronic kidney disease, stage 3 (moderate): Secondary | ICD-10-CM | POA: Diagnosis not present

## 2017-04-29 ENCOUNTER — Other Ambulatory Visit (HOSPITAL_COMMUNITY)
Admission: RE | Admit: 2017-04-29 | Discharge: 2017-04-29 | Disposition: A | Discharge status: Home / Self Care | Payer: Medicare HMO | Source: Ambulatory Visit | Attending: Radiology | Admitting: Radiology

## 2017-04-29 ENCOUNTER — Ambulatory Visit
Admission: RE | Admit: 2017-04-29 | Discharge: 2017-04-29 | Disposition: A | Discharge status: Home / Self Care | Payer: Medicare HMO | Source: Ambulatory Visit | Attending: Family Medicine | Admitting: Family Medicine

## 2017-04-29 DIAGNOSIS — E041 Nontoxic single thyroid nodule: Secondary | ICD-10-CM | POA: Insufficient documentation

## 2017-04-29 DIAGNOSIS — E042 Nontoxic multinodular goiter: Secondary | ICD-10-CM

## 2017-05-01 ENCOUNTER — Encounter: Payer: Self-pay | Admitting: Family Medicine

## 2017-05-01 ENCOUNTER — Telehealth: Payer: Self-pay | Admitting: Family Medicine

## 2017-05-01 NOTE — Telephone Encounter (Signed)
Unable to reach patient regarding normal thyroid biopsy. I will mail result letter to her.

## 2017-05-08 ENCOUNTER — Ambulatory Visit: Payer: Medicare HMO | Admitting: Family Medicine

## 2017-05-19 ENCOUNTER — Encounter: Payer: Self-pay | Admitting: Family Medicine

## 2017-05-19 ENCOUNTER — Encounter: Payer: Self-pay | Admitting: Licensed Clinical Social Worker

## 2017-05-19 ENCOUNTER — Ambulatory Visit (INDEPENDENT_AMBULATORY_CARE_PROVIDER_SITE_OTHER): Payer: Medicare HMO | Admitting: Family Medicine

## 2017-05-19 DIAGNOSIS — F329 Major depressive disorder, single episode, unspecified: Secondary | ICD-10-CM | POA: Diagnosis not present

## 2017-05-19 DIAGNOSIS — R51 Headache: Secondary | ICD-10-CM

## 2017-05-19 DIAGNOSIS — E042 Nontoxic multinodular goiter: Secondary | ICD-10-CM | POA: Diagnosis not present

## 2017-05-19 DIAGNOSIS — R252 Cramp and spasm: Secondary | ICD-10-CM

## 2017-05-19 DIAGNOSIS — F419 Anxiety disorder, unspecified: Secondary | ICD-10-CM | POA: Diagnosis not present

## 2017-05-19 DIAGNOSIS — R519 Headache, unspecified: Secondary | ICD-10-CM

## 2017-05-19 MED ORDER — PAROXETINE HCL 20 MG PO TABS: 20.0000 mg | ORAL_TABLET | Freq: Every day | ORAL | 1 refills | Status: DC

## 2017-05-19 MED ORDER — BACLOFEN 10 MG PO TABS: 10.0000 mg | ORAL_TABLET | Freq: Two times a day (BID) | ORAL | 0 refills | Status: DC | PRN

## 2017-05-19 NOTE — Assessment & Plan Note (Signed)
??   Related to Statin. She did well with flexeril per patient. I switched flexeril to baclofen because of her age. Monitor for improvement.

## 2017-05-19 NOTE — Progress Notes (Addendum)
Total time:15 minutes Type of Service: [place] warm handoff  Interpreter:No.    SUBJECTIVE: Jennifer Foster is a 71 y.o. female referred by Dr. [name] for: symptoms of  anxiety and family stressors. Patient was accompanied by her minor great granddaughter.  Patient reports the following symptoms: becoming easily upset and irritable .  Duration of problem:  Many years and has progressively gotten worse Impact on function: does not go out and do anything.   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  GOALS:  Patient will reduce symptoms of: anxiety and stress,  and increase ability SR:PRXYVO skills, self-management skills and stress reduction, .  Intervention:  Supportive Counseling, Behavioral Therapy (Relaxed breathing), Psychoeducation   Issues discussed: current stressors, how manage stressors, other options , demonstration of relaxed breathing and overview of Integrated Care.      ASSESSMENT:Patient currently experiencing anxiety (GAD=11) and mild depression( PHQ-9=6).  Symptoms exacerbated by difficulty of managing family stressors.  Patient may benefit from, and is in agreement to receive further assessment and brief therapeutic interventions to assist with managing her symptoms.   PLAN:   1.Patient agrees to F/U with LCSW in 2 weeks  2. LCSW will F/U with phone call in 5 to 7 days  3. Behavioral recommendations: relaxed breathing.  4. Referral: [facility] integrated care clinic for F/U  Warm Hand Off Completed.     Casimer Lanius, LCSW Licensed Clinical Social Worker [place]   [phone] 10:24 AM

## 2017-05-19 NOTE — Assessment & Plan Note (Signed)
She self discontinued Lexapro in the past. GAD 7 score of 11 and PHQ9 score of 6 today. I advised her that she will benefit from counseling in addition to medication. A lot of her problem is stress induced regarding her grandson who is in jail. [facility] personnel Casimer Lanius spent time counseling her and discussion relaxation technique. I started her on Paxil today. F/U with me in 4 weeks for reassessment or sooner if worsening. F/U with [facility] in 1-2 weeks for reassessment and counseling.

## 2017-05-19 NOTE — Patient Instructions (Addendum)
I will start you on a new medication for anxiety and depression. You will benefit from counseling and relaxation training in addition to medication. This can be provided by a psychologist or a psychiatrist. Please contact the list of Psychiatrist provided to schedule appointment soon.  Also call [facility]                                   [place]                                   [place], [place] [address]                                   Phone: [phone]    Generalized Anxiety Disorder, Adult Generalized anxiety disorder (GAD) is a mental health disorder. People with this condition constantly worry about everyday events. Unlike normal anxiety, worry related to GAD is not triggered by a specific event. These worries also do not fade or get better with time. GAD interferes with life functions, including relationships, work, and school. GAD can vary from mild to severe. People with severe GAD can have intense waves of anxiety with physical symptoms (panic attacks). What are the causes? The exact cause of GAD is not known. What increases the risk? This condition is more likely to develop in:  Women.  People who have a family history of anxiety disorders.  People who are very shy.  People who experience very stressful life events, such as the death of a loved one.  People who have a very stressful family environment.  What are the signs or symptoms? People with GAD often worry excessively about many things in their lives, such as their health and family. They may also be overly concerned about:  Doing well at work.  Being on time.  Natural disasters.  Friendships.  Physical symptoms of GAD include:  Fatigue.  Muscle tension or having muscle twitches.  Trembling or feeling shaky.  Being easily startled.  Feeling like your heart is pounding or racing.  Feeling out of breath or like you cannot take a deep breath.  Having trouble  falling asleep or staying asleep.  Sweating.  Nausea, diarrhea, or irritable bowel syndrome (IBS).  Headaches.  Trouble concentrating or remembering facts.  Restlessness.  Irritability.  How is this diagnosed? Your health care provider can diagnose GAD based on your symptoms and medical history. You will also have a physical exam. The health care provider will ask specific questions about your symptoms, including how severe they are, when they started, and if they come and go. Your health care provider may ask you about your use of alcohol or drugs, including prescription medicines. Your health care provider may refer you to a mental health specialist for further evaluation. Your health care provider will do a thorough examination and may perform additional tests to rule out other possible causes of your symptoms. To be diagnosed with GAD, a person must have anxiety that:  Is out of his or her control.  Affects several different aspects of his or her life, such as work and relationships.  Causes distress that makes him or her unable to take part in normal activities.  Includes at least three physical symptoms of  GAD, such as restlessness, fatigue, trouble concentrating, irritability, muscle tension, or sleep problems.  Before your health care provider can confirm a diagnosis of GAD, these symptoms must be present more days than they are not, and they must last for six months or longer. How is this treated? The following therapies are usually used to treat GAD:  Medicine. Antidepressant medicine is usually prescribed for long-term daily control. Antianxiety medicines may be added in severe cases, especially when panic attacks occur.  Talk therapy (psychotherapy). Certain types of talk therapy can be helpful in treating GAD by providing support, education, and guidance. Options include: ? Cognitive behavioral therapy (CBT). People learn coping skills and techniques to ease their  anxiety. They learn to identify unrealistic or negative thoughts and behaviors and to replace them with positive ones. ? Acceptance and commitment therapy (ACT). This treatment teaches people how to be mindful as a way to cope with unwanted thoughts and feelings. ? Biofeedback. This process trains you to manage your body's response (physiological response) through breathing techniques and relaxation methods. You will work with a therapist while machines are used to monitor your physical symptoms.  Stress management techniques. These include yoga, meditation, and exercise.  A mental health specialist can help determine which treatment is best for you. Some people see improvement with one type of therapy. However, other people require a combination of therapies. Follow these instructions at home:  Take over-the-counter and prescription medicines only as told by your health care provider.  Try to maintain a normal routine.  Try to anticipate stressful situations and allow extra time to manage them.  Practice any stress management or self-calming techniques as taught by your health care provider.  Do not punish yourself for setbacks or for not making progress.  Try to recognize your accomplishments, even if they are small.  Keep all follow-up visits as told by your health care provider. This is important. Contact a health care provider if:  Your symptoms do not get better.  Your symptoms get worse.  You have signs of depression, such as: ? A persistently sad, cranky, or irritable mood. ? Loss of enjoyment in activities that used to bring you joy. ? Change in weight or eating. ? Changes in sleeping habits. ? Avoiding friends or family members. ? Loss of energy for normal tasks. ? Feelings of guilt or worthlessness. Get help right away if:  You have serious thoughts about hurting yourself or others. If you ever feel like you may hurt yourself or others, or have thoughts about taking  your own life, get help right away. You can go to your nearest emergency department or call:  Your local emergency services (911 in the [place]  A suicide crisis helpline, such as the [place] at 458-326-2298. This is open 24 hours a day.  Summary  Generalized anxiety disorder (GAD) is a mental health disorder that involves worry that is not triggered by a specific event.  People with GAD often worry excessively about many things in their lives, such as their health and family.  GAD may cause physical symptoms such as restlessness, trouble concentrating, sleep problems, frequent sweating, nausea, diarrhea, headaches, and trembling or muscle twitching.  A mental health specialist can help determine which treatment is best for you. Some people see improvement with one type of therapy. However, other people require a combination of therapies. This information is not intended to replace advice given to you by your health care provider. Make sure you discuss  any questions you have with your health care provider. Document Released: 12/13/2012 Document Revised: 07/08/2016 Document Reviewed: 07/08/2016 Elsevier Interactive Patient Education  [employer].
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
## 2017-05-19 NOTE — Assessment & Plan Note (Signed)
No signs of meningeal irritation. Giant cell arteritis less likely based on evaluation. CRP done few months ago was normal. ESR slightly elevated. Likely stress related headache. Intracranial pathology less likely but a possibility. She will benefit from neuro imaging. Unfortunately we did not discuss this during this visit; we focused more on her anxiety and depression. Tylenol recommended prn and relaxation. I will reassess at next visit.

## 2017-05-19 NOTE — Assessment & Plan Note (Signed)
Thyroid biopsy result reviewed and discussed with patient. Benign result. Monitor for now.

## 2017-05-27 ENCOUNTER — Other Ambulatory Visit: Payer: Self-pay | Admitting: Family Medicine

## 2017-05-27 ENCOUNTER — Telehealth: Payer: Self-pay | Admitting: Licensed Clinical Social Worker

## 2017-05-27 MED ORDER — PAROXETINE HCL 20 MG PO TABS: 20.0000 mg | ORAL_TABLET | Freq: Every day | ORAL | 1 refills | Status: DC

## 2017-05-27 NOTE — Progress Notes (Addendum)
[facility] F/U call .   Patient reports she has implemented relaxed breathing twice a day since meeting with LCSW.  States it has really helped her relax.  She has not started taking Paxil prescribed by PCP.  [facility] on [address] informed her they did not receive a Rx.   In reviewing patient's chart it looks like the Rx was sent to [facility].  Patient reports all of her medications are at [facility].  She would like this Rx also sent to [facility]. LCSW provided PCP with an update.  LCSW called patient with an update.  Rx sent to [facility].  Reminded patient of [facility] services and that she could schedule f/u appointment with LCSW for assistance with managing stressors.  Also minded patient to schedule f/u with PCP in 4 weeks.  Patient appreciative of f/u call.  Casimer Lanius, LCSW Licensed Clinical Social Worker [place]   [phone] 10:56 AM

## 2017-05-27 NOTE — Telephone Encounter (Signed)
Paxil Faxed to pharmacy.

## 2017-07-01 ENCOUNTER — Telehealth: Payer: Self-pay

## 2017-07-01 ENCOUNTER — Other Ambulatory Visit: Payer: Self-pay | Admitting: Family Medicine

## 2017-07-01 MED ORDER — ROSUVASTATIN CALCIUM 20 MG PO TABS: 20.0000 mg | ORAL_TABLET | Freq: Every day | ORAL | 1 refills | Status: DC

## 2017-07-01 MED ORDER — AMLODIPINE BESYLATE 10 MG PO TABS: 10.0000 mg | ORAL_TABLET | Freq: Every day | ORAL | 1 refills | Status: DC

## 2017-07-01 NOTE — Telephone Encounter (Signed)
Patient needs a refill on Amlodipine and she made an appointment for 07/14/2017. She will run out of it before her appointment. Thanks.Jennifer Foster

## 2017-07-01 NOTE — Telephone Encounter (Signed)
Patient is aware that I escribed her meds to the pharmacy.

## 2017-07-01 NOTE — Addendum Note (Signed)
Addended by: Andrena Mews T on: 07/01/2017 09:47 AM   Modules accepted: Orders

## 2017-07-01 NOTE — Telephone Encounter (Signed)
Done

## 2017-07-02 ENCOUNTER — Emergency Department (HOSPITAL_COMMUNITY)
Admission: EM | Admit: 2017-07-02 | Discharge: 2017-07-02 | Disposition: A | Discharge status: Home / Self Care | Payer: Medicare HMO | Attending: Emergency Medicine | Admitting: Emergency Medicine

## 2017-07-02 ENCOUNTER — Encounter (HOSPITAL_COMMUNITY): Payer: Self-pay | Admitting: Emergency Medicine

## 2017-07-02 ENCOUNTER — Emergency Department (HOSPITAL_COMMUNITY): Payer: Medicare HMO

## 2017-07-02 DIAGNOSIS — J45909 Unspecified asthma, uncomplicated: Secondary | ICD-10-CM | POA: Diagnosis not present

## 2017-07-02 DIAGNOSIS — Z79899 Other long term (current) drug therapy: Secondary | ICD-10-CM | POA: Insufficient documentation

## 2017-07-02 DIAGNOSIS — I129 Hypertensive chronic kidney disease with stage 1 through stage 4 chronic kidney disease, or unspecified chronic kidney disease: Secondary | ICD-10-CM | POA: Diagnosis not present

## 2017-07-02 DIAGNOSIS — N183 Chronic kidney disease, stage 3 (moderate): Secondary | ICD-10-CM | POA: Diagnosis not present

## 2017-07-02 DIAGNOSIS — M25562 Pain in left knee: Secondary | ICD-10-CM | POA: Insufficient documentation

## 2017-07-02 DIAGNOSIS — F17210 Nicotine dependence, cigarettes, uncomplicated: Secondary | ICD-10-CM | POA: Insufficient documentation

## 2017-07-02 MED ORDER — HYDROCODONE-ACETAMINOPHEN 5-325 MG PO TABS: 1.0000 | ORAL_TABLET | Freq: Four times a day (QID) | ORAL | 0 refills | Status: DC | PRN

## 2017-07-02 MED ORDER — HYDROCODONE-ACETAMINOPHEN 5-325 MG PO TABS
1.0000 | ORAL_TABLET | Freq: Once | ORAL | Status: AC
  Administered 2017-07-02: 1 via ORAL
  Filled 2017-07-02: qty 1

## 2017-07-02 NOTE — Discharge Instructions (Signed)
Follow the RICE (Rest, Ice, Compression, Elevation) protocol as directed.   You can take the pain medication for severe or break through pain.   Follow-up with your primary care doctor. Follow-up with the referred orthopedic doctor.

## 2017-07-02 NOTE — ED Provider Notes (Signed)
[facility] DEPT Provider Note   CSN: [id number] Arrival date & time: 07/02/17  1107     History   Chief Complaint Chief Complaint  Patient presents with  . Knee Pain    left    HPI Jennifer Foster is a 71 y.o. female who presents with left knee pain that began this morning. Patient states that she woke up this morning and started experiencing some left knee pain. She also noted some mild swelling to the knee. She denies any history of trauma, fall, injury. She denies any warmth or redness to the knee. Patient states that she still able to ambulate though ambulating worsens her pain. She's been using a knee sleeve with no improvement. She has not taken any medications for the pain. Patient does have a history of joint pain and states that this feels similar. Patient denies any fevers, chills, numbness/weakness, color change, warmth.  The history is provided by the patient.    Past Medical History:  Diagnosis Date  . Acute blood loss anemia 06/26/2015  . Acute blood loss anemia 06/26/2015  . Asthma   . Chronic kidney disease (CKD), stage III (moderate) ([place]) 06/27/2015  . DIVERTICULAR BLEEDING, HX OF 10/14/2007   Colonoscopy 2008 showed diverticulosis.   [name] GERD (gastroesophageal reflux disease)   . Gout   . History of anemia   . History of asthma   . Hyperlipemia   . Hypertension   . Joint pain   . Lipoma   . SOB (shortness of breath) 03/18/2016    Patient Active Problem List   Diagnosis Date Noted  . Multinodular goiter 04/14/2017  . Adrenal adenoma 04/14/2017  . Aneurysm of thoracic aorta ([place]) 04/03/2017  . Muscle cramp 03/31/2017  . Irregular heart rhythm 03/06/2017  . Loss of weight 01/30/2017  . Dizziness 11/25/2016  . Dermatitis 08/15/2016  . Proteinuria 01/11/2016  . Anxiety and depression 12/25/2015  . Chronic kidney disease (CKD), stage III (moderate) ([place]) 06/27/2015  . Cocaine abuse ([place])   . Headache 02/20/2015  . Gout  02/28/2014  . Left knee pain 10/12/2013  . Subacute back pain 07/20/2012  . Lipoma 03/19/2011  . Hyperlipidemia with target LDL less than 100 01/01/2011  . ANEMIA 11/23/2009  . TOBACCO USER 09/03/2009  . Generalized anxiety disorder 08/13/2009  . HYPERTENSION, BENIGN ESSENTIAL 10/14/2007    Past Surgical History:  Procedure Laterality Date  . ABDOMINAL HYSTERECTOMY  1981   partial, per pt history  . COLONOSCOPY N/A 12/08/2013   Procedure: COLONOSCOPY;  Surgeon: [name], MD;  Location: [facility];  Service: Endoscopy;  Laterality: N/A;  . COLONOSCOPY N/A 06/28/2015   Procedure: COLONOSCOPY;  Surgeon: Clarene Essex, MD;  Location: [facility] ENDOSCOPY;  Service: Endoscopy;  Laterality: N/A;  . ESOPHAGOGASTRODUODENOSCOPY N/A 12/08/2013   Procedure: ESOPHAGOGASTRODUODENOSCOPY (EGD);  Surgeon: [name], MD;  Location: [facility] ENDOSCOPY;  Service: Endoscopy;  Laterality: N/A;  . GIVENS CAPSULE STUDY N/A 12/08/2013   Procedure: GIVENS CAPSULE STUDY;  Surgeon: [name], MD;  Location: [facility];  Service: Endoscopy;  Laterality: N/A;  . LIPOMA EXCISION  01/28/11   neck    OB History    No data available       Home Medications    Prior to Admission medications   Medication Sig Start Date End Date Taking? Authorizing Provider  amLODipine (NORVASC) 10 MG tablet Take 1 tablet (10 mg total) by mouth daily. 07/01/17   [name], MD  baclofen (LIORESAL)  10 MG tablet Take 1 tablet (10 mg total) by mouth 2 (two) times daily as needed for muscle spasms. 05/19/17   [name], MD  HYDROcodone-acetaminophen (NORCO/VICODIN) 5-325 MG tablet Take 1-2 tablets by mouth every 6 (six) hours as needed. 07/02/17   Volanda Napoleon, PA-C  irbesartan (AVAPRO) 150 MG tablet Take 150 mg by mouth daily. 02/20/17   [provider]  nicotine (NICODERM CQ - DOSED IN MG/24 HR) 7 mg/24hr patch PLACE 1 PATCH (7 MG TOTAL) ONTO THE SKIN DAILY Patient not taking: Reported on 02/21/2017 02/09/17    [name], MD  PARoxetine (PAXIL) 20 MG tablet Take 1 tablet (20 mg total) by mouth daily. 05/27/17   [name], MD  rosuvastatin (CRESTOR) 20 MG tablet Take 1 tablet (20 mg total) by mouth at bedtime. 07/01/17   [name], MD    Family History Family History  Problem Relation Age of Onset  . Diabetes type II Unknown   . Kidney failure Unknown   . Kidney failure Son     Social History Social History  Substance Use Topics  . Smoking status: Current Some Day Smoker    Packs/day: 0.25    Types: Cigarettes  . Smokeless tobacco: Never Used  . Alcohol use 0.0 oz/week     Comment: occasionally     Allergies   Ace inhibitors; Lisinopril; and Tramadol   Review of Systems Review of Systems  Musculoskeletal:       Left knee pain     Physical Exam Updated Vital Signs BP (!) 156/70 (BP Location: Left Arm)   Pulse (!) 57   Temp 98.4 F (36.9 C) (Oral)   Resp 18   Ht 5\' 4"  (1.626 m)   Wt 68 kg (150 lb)   SpO2 100%   BMI 25.75 kg/m   Physical Exam  Constitutional: She appears well-developed and well-nourished.  Sitting comfortably on examination table  HENT:  Head: Normocephalic and atraumatic.  Eyes: Conjunctivae and EOM are normal. Right eye exhibits no discharge. Left eye exhibits no discharge. No scleral icterus.  Cardiovascular:  Pulses:      Radial pulses are 2+ on the right side, and 2+ on the left side.  Pulmonary/Chest: Effort normal.  Musculoskeletal:  Tenderness palpation to the medioanterior aspect of the left knee just at the proximal tib-fib that extends posteriorly. No evidence of Baker's cyst. No calf tenderness. No deformity or crepitus noted. Very mild overlying soft tissue swelling noted to the patellar region. No overlying warmth, erythema. No lower extremity edema. Flexion/extension of left knee intact without any difficulty. No instability noted on varus or valgus stress.  Neurological: She is alert.  Sensation intact along  major nerve distributions of the BLE. Slight limp of left knee with ambulating secondary to patient's pain  Skin: Skin is warm and dry.  Psychiatric: She has a normal mood and affect. Her speech is normal and behavior is normal.  Nursing note and vitals reviewed.    ED Treatments / Results  Labs (all labs ordered are listed, but only abnormal results are displayed) Labs Reviewed - No data to display  EKG  EKG Interpretation None       Radiology Dg Knee Complete 4 Views Left  Result Date: 07/02/2017 CLINICAL DATA:  Woke up with left knee pain, no history of trauma, there is a history of gout EXAM: LEFT KNEE - COMPLETE 4+ VIEW COMPARISON:  Left knee films of 05/26/2015 FINDINGS: The left knee  joint spaces are well preserved for age. Very little degenerative changes present. No fracture is seen. No joint effusion is noted. No erosion is seen. IMPRESSION: Negative. Electronically Signed   By: Ivar Drape M.D.   On: 07/02/2017 12:01    Procedures Procedures (including critical care time)  Medications Ordered in ED Medications  HYDROcodone-acetaminophen (NORCO/VICODIN) 5-325 MG per tablet 1 tablet (1 tablet Oral Given 07/02/17 1215)     Initial Impression / Assessment and Plan / ED Course  I have reviewed the triage vital signs and the nursing notes.  Pertinent labs & imaging results that were available during my care of the patient were reviewed by me and considered in my medical decision making (see chart for details).     71 year old female who presents with left knee pain that began this morning. Also noted mild soft tissue swelling. No warmth, erythema, fever. Patient is afebrile, non-toxic appearing, sitting comfortably on examination table. Vital signs reviewed. Patient is slightly hypertensive. She states that she did not take her blood pressure medications this morning. Likely a combo of noncompliance with medication and pain. Consider fracture versus dislocation versus  arthritis versus sprain. History/physical exam are not concerning for DVT or septic arthritis. Plan to obtain XR imaging for further evaluation. Analgesics provided in the department.  Knee x-ray reviewed. Negative for any effusion, fracture or dislocation. Discussed results with patient. Patient able to injury in the department. She does report some pain with ambulation. Will plan to provide additional knee sleeve in the department. Patient instructed follow-up with her primary care doctor next 24-48 hours for further evaluation. Strict return precautions discussed. Patient expresses understanding and agreement to plan.    Final Clinical Impressions(s) / ED Diagnoses   Final diagnoses:  Acute pain of left knee    New Prescriptions Discharge Medication List as of 07/02/2017 12:41 PM    START taking these medications   Details  HYDROcodone-acetaminophen (NORCO/VICODIN) 5-325 MG tablet Take 1-2 tablets by mouth every 6 (six) hours as needed., Starting Thu 07/02/2017, Print         Volanda Napoleon, PA-C 07/03/17 0001    Lacretia Leigh, MD 07/06/17 1452

## 2017-07-02 NOTE — ED Triage Notes (Signed)
pt reports left knee non traumatic pain, Hx of fluids in knee. Difficulty ambulating noted.

## 2017-07-03 DIAGNOSIS — N183 Chronic kidney disease, stage 3 (moderate): Secondary | ICD-10-CM | POA: Diagnosis not present

## 2017-07-03 DIAGNOSIS — D631 Anemia in chronic kidney disease: Secondary | ICD-10-CM | POA: Diagnosis not present

## 2017-07-03 DIAGNOSIS — F1910 Other psychoactive substance abuse, uncomplicated: Secondary | ICD-10-CM | POA: Diagnosis not present

## 2017-07-03 DIAGNOSIS — I10 Essential (primary) hypertension: Secondary | ICD-10-CM | POA: Diagnosis not present

## 2017-07-03 DIAGNOSIS — Z720 Tobacco use: Secondary | ICD-10-CM | POA: Diagnosis not present

## 2017-07-03 DIAGNOSIS — N2581 Secondary hyperparathyroidism of renal origin: Secondary | ICD-10-CM | POA: Diagnosis not present

## 2017-07-14 ENCOUNTER — Ambulatory Visit: Payer: Medicare HMO | Admitting: Family Medicine

## 2017-08-04 IMAGING — CT CT CHEST W/ CM
2 of 3 series · 15 of 36 positions shown, 18 images · IV contrast (iopamidol)
Comparison: 75 mL Rsovue-CSS

CLINICAL DATA: 70-year-old female with weight loss. Evaluate for
possible lung cancer.

EXAM:
CT CHEST WITH CONTRAST
TECHNIQUE: Multidetector CT imaging of the chest was performed during
intravenous contrast administration.
CONTRAST:  75mL EGBZ1I-DRR IOPAMIDOL (EGBZ1I-DRR) INJECTION 61%

[Series 2: axial st · axial · 0.72mm/px · z∈[-284,-22]mm · 12 of 155 slices shown, 15 images]
[im 12/155  mediastinal]
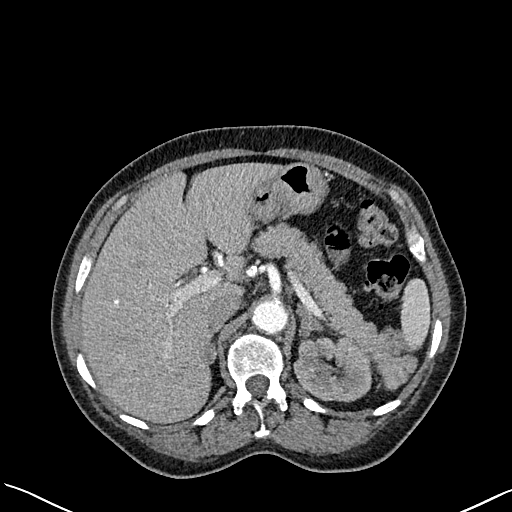
[im 12/155  lung]
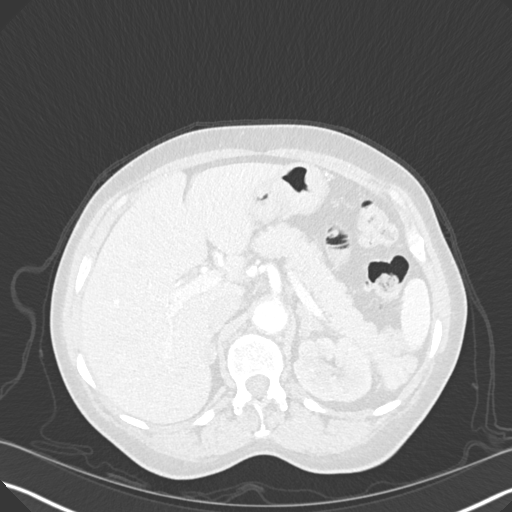
[im 23/155  lung]
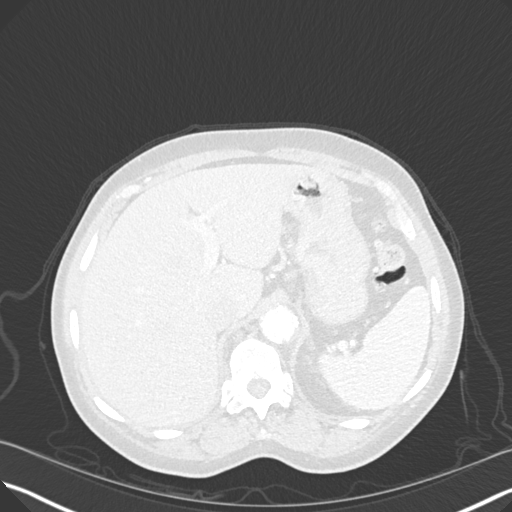
[im 35/155  lung]
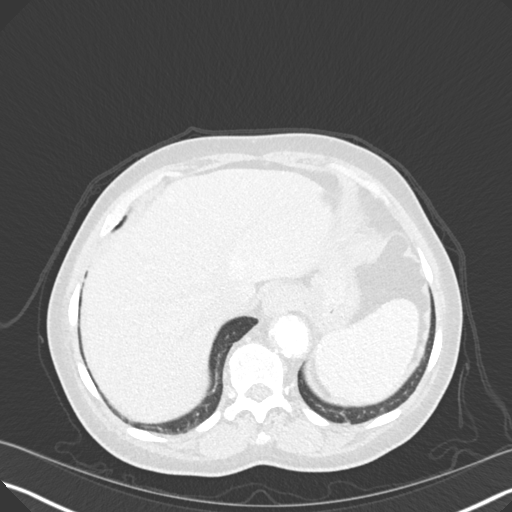
[im 46/155  lung]
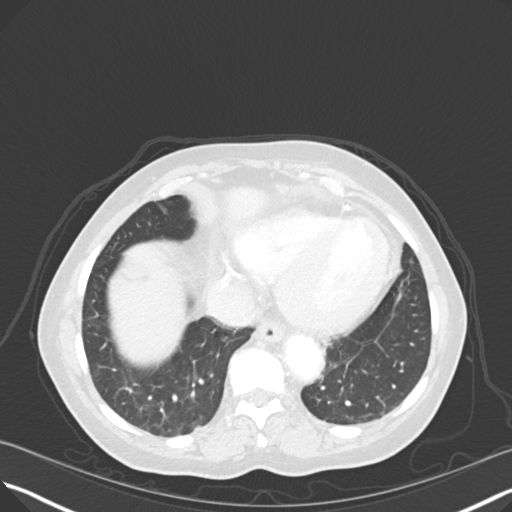
[im 58/155  mediastinal]
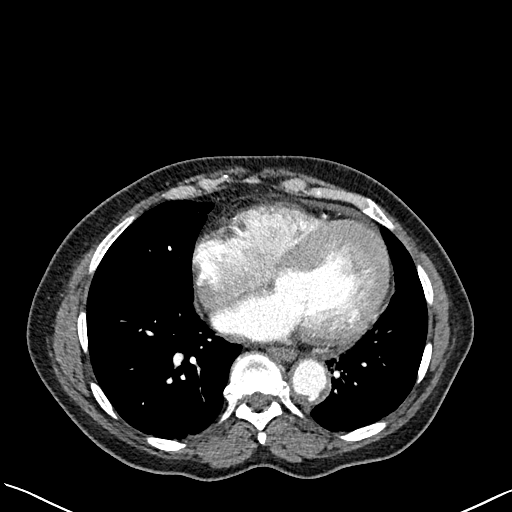
[im 58/155  lung]
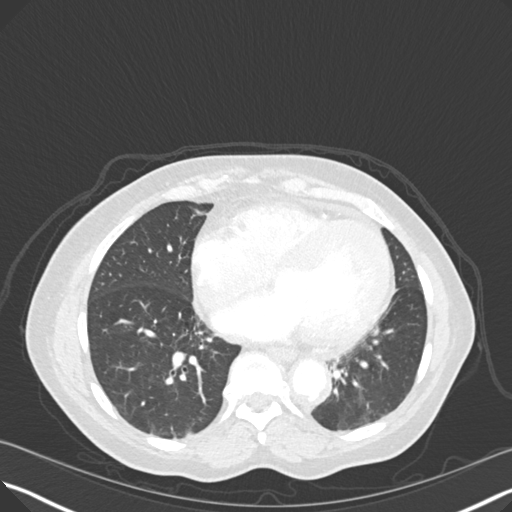
[im 69/155  lung]
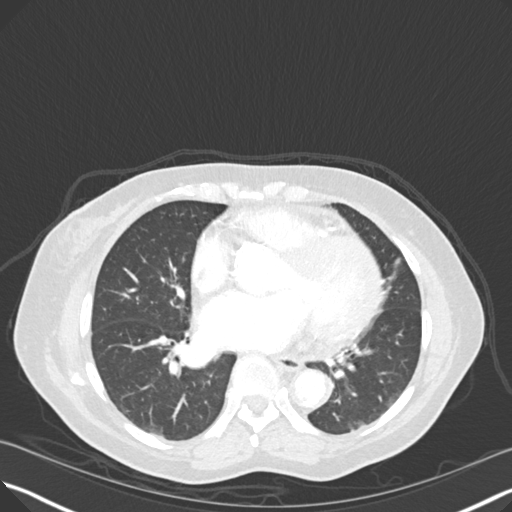
[im 86/155  lung]
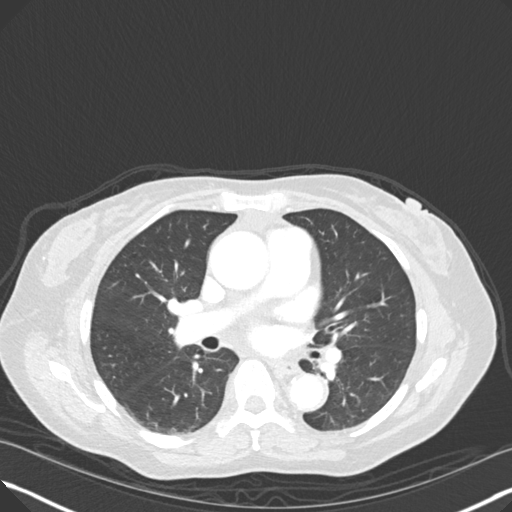
[im 97/155  lung]
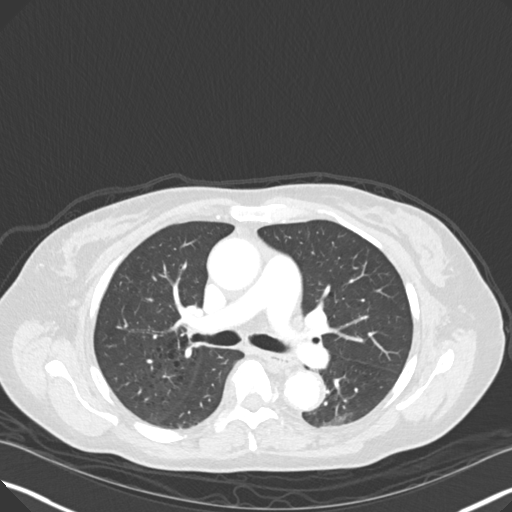
[im 109/155  mediastinal]
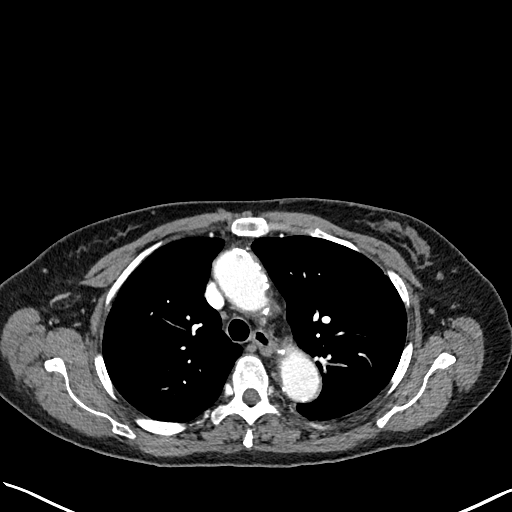
[im 109/155  lung]
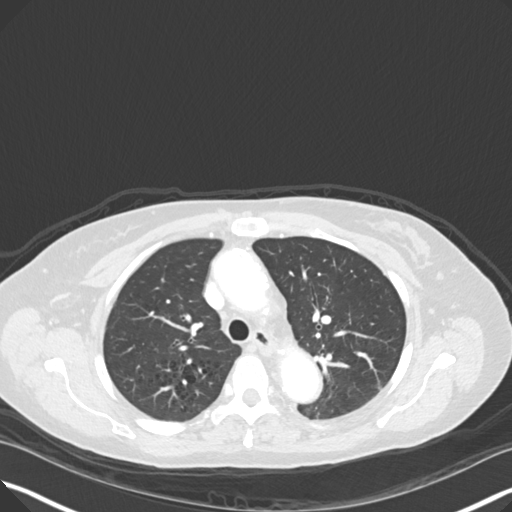
[im 120/155  lung]
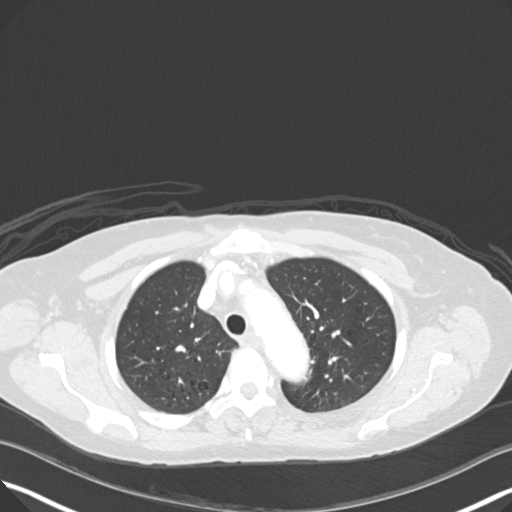
[im 132/155  lung]
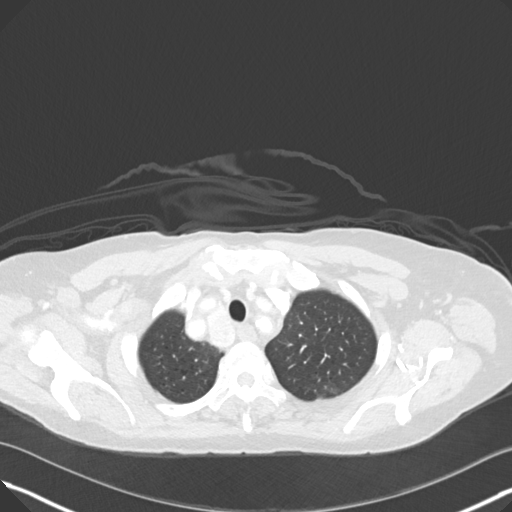
[im 143/155  lung]
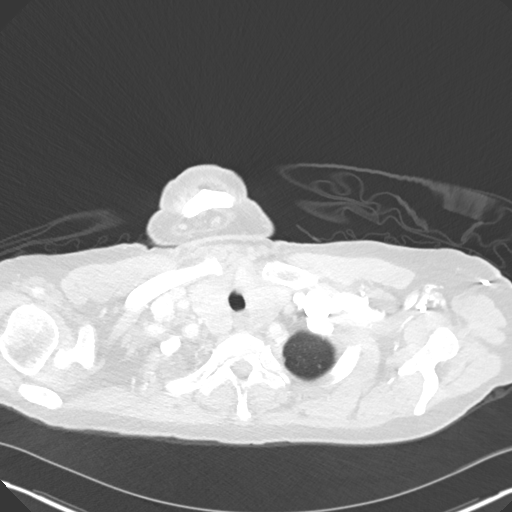

[Series 5: coronal · coronal · 0.60mm/px · 3 of 121 slices shown]
[im 25/121  lung]
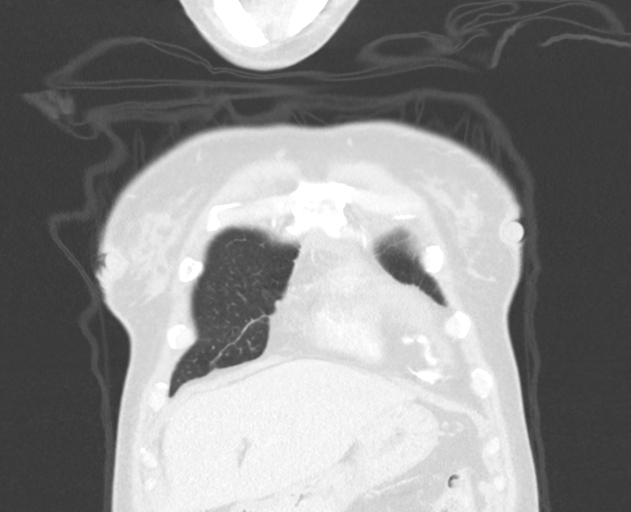
[im 49/121  lung]
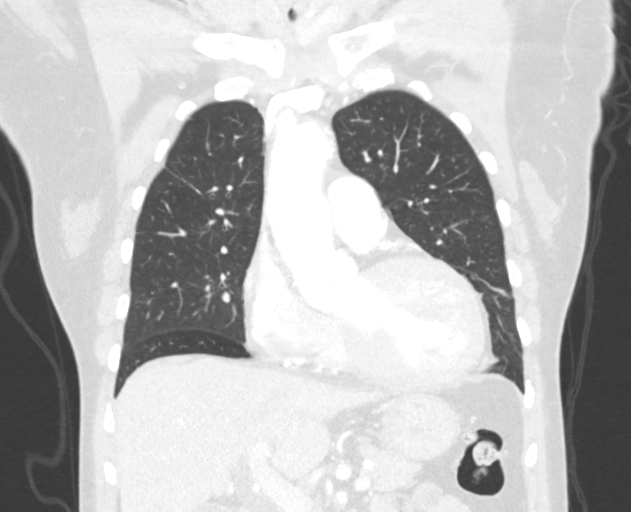
[im 73/121  lung]
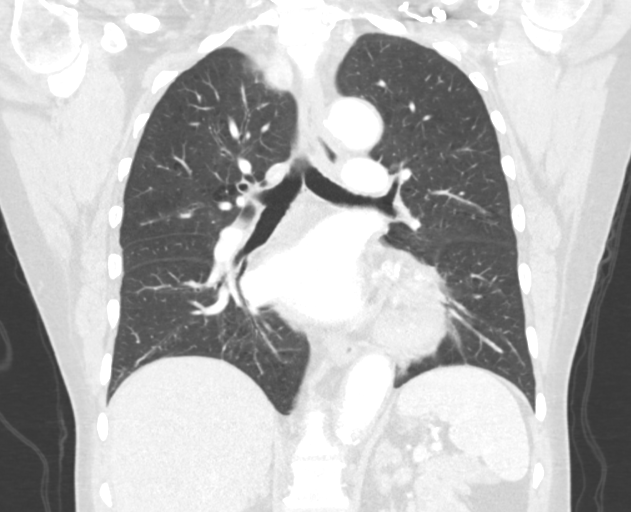

[15 of 36 positions shown; findings below may reference images not displayed]

FINDINGS: Cardiovascular: 3 vessel aortic arch with heterogeneous
atherosclerotic plaque throughout. The tubular portion of the
thoracic aorta is aneurysmal at 4.1 cm. Calcifications are present
throughout the left main, left anterior descending, circumflex and
right coronary arteries. Mild cardiomegaly. No pericardial effusion.
The main pulmonary artery is normal in caliber. No evidence of
central pulmonary embolus.

Mediastinum/Nodes: Heterogeneous enlarged multinodular thyroid
goiter. The thoracic inlet is otherwise unremarkable. No suspicious
mediastinal or hilar adenopathy. Unremarkable thoracic esophagus.

Lungs/Pleura: Mild centrilobular pulmonary emphysema.

Upper Abdomen: Scattered atherosclerotic vascular calcifications.
Adreniform thickening of the left adrenal gland. Probable 1.9 cm
low-attenuation right adrenal nodule most consistent with a benign
adenoma. No acute abnormality.

Musculoskeletal: No acute fracture or aggressive appearing lytic or
blastic osseous lesion.
IMPRESSION: 1. No suspicious pulmonary nodule or mass.
2. Atherosclerotic and mildly aneurysmal ascending thoracic aorta
with a maximal diameter of 4.1 cm. Recommend annual imaging followup
by CTA or MRA. This recommendation follows 1838
ACCF/AHA/AATS/ACR/ASA/SCA/JIM/CEOLA/GINO/ZOOLIAN Guidelines for the
Diagnosis and Management of Patients with Thoracic Aortic Disease.
Circulation. 1838; 121: [id number]
3. Coronary artery calcifications.
4. Mild centrilobular pulmonary emphysema.
5. Multinodular thyroid goiter.
6. Left adrenal thickening likely representing hyperplasia and
cm right adrenal adenoma.

Aortic Atherosclerosis (V3S2W-170.0); Aortic Atherosclerosis
(V3S2W-SH4.4) and Emphysema (V3S2W-LHJ.B).

## 2017-08-07 ENCOUNTER — Ambulatory Visit (INDEPENDENT_AMBULATORY_CARE_PROVIDER_SITE_OTHER): Payer: Medicare Other | Admitting: Family Medicine

## 2017-08-07 ENCOUNTER — Other Ambulatory Visit: Payer: Self-pay

## 2017-08-07 ENCOUNTER — Encounter: Payer: Self-pay | Admitting: Family Medicine

## 2017-08-07 VITALS — BP 132/82 | HR 70 | Temp 98.1°F | Ht 64.0 in | Wt 163.0 lb

## 2017-08-07 DIAGNOSIS — R252 Cramp and spasm: Secondary | ICD-10-CM

## 2017-08-07 DIAGNOSIS — E785 Hyperlipidemia, unspecified: Secondary | ICD-10-CM

## 2017-08-07 DIAGNOSIS — F1410 Cocaine abuse, uncomplicated: Secondary | ICD-10-CM

## 2017-08-07 DIAGNOSIS — M62838 Other muscle spasm: Secondary | ICD-10-CM

## 2017-08-07 DIAGNOSIS — F17200 Nicotine dependence, unspecified, uncomplicated: Secondary | ICD-10-CM | POA: Diagnosis not present

## 2017-08-07 DIAGNOSIS — M542 Cervicalgia: Secondary | ICD-10-CM | POA: Diagnosis not present

## 2017-08-07 DIAGNOSIS — M545 Low back pain, unspecified: Secondary | ICD-10-CM

## 2017-08-07 DIAGNOSIS — I10 Essential (primary) hypertension: Secondary | ICD-10-CM | POA: Diagnosis not present

## 2017-08-07 MED ORDER — BACLOFEN 5 MG PO TABS
5.0000 mg | ORAL_TABLET | Freq: Two times a day (BID) | ORAL | 0 refills | Status: DC | PRN
Start: 2017-08-07 — End: 2018-04-13

## 2017-08-07 NOTE — Patient Instructions (Signed)

## 2017-08-07 NOTE — Assessment & Plan Note (Signed)
Compliant with meds. Recheck FLP today. Test ordered.

## 2017-08-07 NOTE — Assessment & Plan Note (Signed)
BP looks good. Continue current regimen. 

## 2017-08-07 NOTE — Assessment & Plan Note (Signed)
Patient quitted.  She was commended on this. I will resolve from her problem list and place it in her past history.

## 2017-08-07 NOTE — Assessment & Plan Note (Signed)
Normal exam today. Tylenol recommended prn pain. Home back exercise given. F/U soon if worsening.

## 2017-08-07 NOTE — Assessment & Plan Note (Signed)
??   charley horses vs. due to dehydration. Also consider electrolyte imbalance. Will check Bmet, CK/ Myo, Magnesium, Calcium. Keep well hydrated. Muscle massaging discussed. Baclofen prescribed prn spasm. Will monitor.

## 2017-08-07 NOTE — Assessment & Plan Note (Signed)
Likely due to positioning, the way she slept in bed. A bit of muscle spasm. Baclofen prescribed. Tylenol as needed for pain.

## 2017-08-07 NOTE — Assessment & Plan Note (Signed)
Working on cutting back. She was commended.

## 2017-08-07 NOTE — Progress Notes (Signed)
Subjective:     Patient ID: Jennifer Foster, female   DOB: 07/29/1946, 71 y.o.   MRN: [id number]  HPI HTN/HLD:Here for follow-up. She is compliant with meds. Denies any concern. Cocaine/Tobacco smoking:She is doing a great job cutting back on smoking. She uses Nicotine gum and not patch. She quick cocaine months ago and had not gone back to it.  Neck/Back Pain: C/O Neck pain which started 2 days ago, Back Pain for 1-2 weeks. No fall. Neck pain is mostly on the right side of her neck, feels like a pull. In the morning, depending on the way she moves she gets back pain. Pain is about 7/10 in severity whenever she moves. She feels a bit better now. She had not used anything for pain. Muscle spasm: C/O cramping and spasm of her right feet, also her arm. It cramps for a while and then it releases. At times she has it on her calf as well. This happens 2-3 times a week.   Current Outpatient Medications on File Prior to Visit  Medication Sig Dispense Refill  . amLODipine (NORVASC) 10 MG tablet Take 1 tablet (10 mg total) by mouth daily. 90 tablet 1  . irbesartan (AVAPRO) 300 MG tablet Take 300 mg by mouth daily.   0  . rosuvastatin (CRESTOR) 20 MG tablet Take 1 tablet (20 mg total) by mouth at bedtime. 90 tablet 1  . baclofen (LIORESAL) 10 MG tablet Take 1 tablet (10 mg total) by mouth 2 (two) times daily as needed for muscle spasms. (Patient not taking: Reported on 08/07/2017) 30 each 0  . HYDROcodone-acetaminophen (NORCO/VICODIN) 5-325 MG tablet Take 1-2 tablets by mouth every 6 (six) hours as needed. (Patient not taking: Reported on 08/07/2017) 6 tablet 0  . nicotine (NICODERM CQ - DOSED IN MG/24 HR) 7 mg/24hr patch PLACE 1 PATCH (7 MG TOTAL) ONTO THE SKIN DAILY (Patient not taking: Reported on 02/21/2017) 28 patch 0  . PARoxetine (PAXIL) 20 MG tablet Take 1 tablet (20 mg total) by mouth daily. (Patient not taking: Reported on 08/07/2017) 30 tablet 1   No current facility-administered medications on file  prior to visit.    Past Medical History:  Diagnosis Date  . Acute blood loss anemia 06/26/2015  . Acute blood loss anemia 06/26/2015  . Asthma   . Chronic kidney disease (CKD), stage III (moderate) ([place]) 06/27/2015  . DIVERTICULAR BLEEDING, HX OF 10/14/2007   Colonoscopy 2008 showed diverticulosis.   [name] GERD (gastroesophageal reflux disease)   . Gout   . History of anemia   . History of asthma   . Hyperlipemia   . Hypertension   . Joint pain   . Lipoma   . SOB (shortness of breath) 03/18/2016   Vitals:   08/07/17 0848  BP: 132/82  Pulse: 70  Temp: 98.1 F (36.7 C)  TempSrc: Oral  SpO2: 96%  Weight: 163 lb (73.9 kg)  Height: 5\' 4"  (1.626 m)     Review of Systems  HENT:       Neck pain  Respiratory: Negative.   Cardiovascular: Negative.   Gastrointestinal: Negative.   Musculoskeletal: Positive for arthralgias.  Neurological: Negative.   Psychiatric/Behavioral: The patient is not nervous/anxious.   All other systems reviewed and are negative.      Objective:   Physical Exam  Constitutional: She is oriented to person, place, and time. She appears well-developed. No distress.  Neck: Normal range of motion. Neck supple.  Mild tenderness and firmness of her right  trapezius muscle  Cardiovascular: Normal rate, regular rhythm and normal heart sounds.  No murmur heard. Pulmonary/Chest: Effort normal and breath sounds normal. No respiratory distress. She has no wheezes.  Abdominal: Soft. Bowel sounds are normal. She exhibits no distension. There is no tenderness.  Musculoskeletal: Normal range of motion. She exhibits no edema, tenderness or deformity.       Cervical back: Normal.       Thoracic back: Normal.       Lumbar back: Normal.  No calf tenderness  Neurological: She is alert and oriented to person, place, and time.  Psychiatric: She has a normal mood and affect.  Nursing note and vitals reviewed.      Assessment:     HTN HLD Cocaine Tobacco Neck  pain Back pain Muscle spasm    Plan:     Check problem list.

## 2017-08-08 LAB — BASIC METABOLIC PANEL
BUN/Creatinine Ratio: 13 (ref 12–28)
BUN: 18 mg/dL (ref 8–27)
CALCIUM: 8.6 mg/dL — AB (ref 8.7–10.3)
CHLORIDE: 105 mmol/L (ref 96–106)
CO2: 22 mmol/L (ref 20–29)
Creatinine, Ser: 1.41 mg/dL — ABNORMAL HIGH (ref 0.57–1.00)
GFR calc Af Amer: 44 mL/min/{1.73_m2} — ABNORMAL LOW (ref >59)
GFR calc non Af Amer: 38 mL/min/{1.73_m2} — ABNORMAL LOW (ref >59)
GLUCOSE: 101 mg/dL — AB (ref 65–99)
POTASSIUM: 3.9 mmol/L (ref 3.5–5.2)
SODIUM: 140 mmol/L (ref 134–144)

## 2017-08-08 LAB — MAGNESIUM: MAGNESIUM: 2 mg/dL (ref 1.6–2.3)

## 2017-08-08 LAB — MYOGLOBIN, SERUM: MYOGLOBIN: 47 ng/mL (ref 25–58)

## 2017-08-08 LAB — LIPID PANEL
CHOLESTEROL TOTAL: 149 mg/dL (ref 100–199)
Chol/HDL Ratio: 2.6 ratio (ref 0.0–4.4)
HDL: 57 mg/dL (ref >39)
LDL Calculated: 74 mg/dL (ref 0–99)
Triglycerides: 88 mg/dL (ref 0–149)
VLDL Cholesterol Cal: 18 mg/dL (ref 5–40)

## 2017-08-08 LAB — CK: Total CK: 55 U/L (ref 24–173)

## 2017-08-10 ENCOUNTER — Telehealth: Payer: Self-pay | Admitting: Family Medicine

## 2017-08-10 NOTE — Telephone Encounter (Signed)
Test result discussed with patient. Unclear the reason for her muscle cramping and spasm based on result.   It could certainly be related to her kidney function. Also her calcium level is a bit low but not enough to be symptomatic. I however recommended OTC Oscal D. Keep well hydrated and follow-up as needed. She agreed with plan.

## 2017-08-31 ENCOUNTER — Other Ambulatory Visit: Payer: Self-pay | Admitting: Family Medicine

## 2017-08-31 DIAGNOSIS — Z1231 Encounter for screening mammogram for malignant neoplasm of breast: Secondary | ICD-10-CM

## 2017-10-01 ENCOUNTER — Ambulatory Visit: Payer: Medicare Other

## 2017-10-02 ENCOUNTER — Ambulatory Visit: Payer: Medicare Other

## 2017-10-08 ENCOUNTER — Emergency Department (HOSPITAL_COMMUNITY)
Admission: EM | Admit: 2017-10-08 | Discharge: 2017-10-08 | Disposition: A | Discharge status: Home / Self Care | Payer: Medicare HMO | Attending: Emergency Medicine | Admitting: Emergency Medicine

## 2017-10-08 ENCOUNTER — Emergency Department (HOSPITAL_COMMUNITY): Payer: Medicare HMO

## 2017-10-08 ENCOUNTER — Other Ambulatory Visit: Payer: Self-pay

## 2017-10-08 ENCOUNTER — Encounter (HOSPITAL_COMMUNITY): Payer: Self-pay | Admitting: Emergency Medicine

## 2017-10-08 DIAGNOSIS — R079 Chest pain, unspecified: Secondary | ICD-10-CM | POA: Diagnosis present

## 2017-10-08 DIAGNOSIS — Z5321 Procedure and treatment not carried out due to patient leaving prior to being seen by health care provider: Secondary | ICD-10-CM | POA: Insufficient documentation

## 2017-10-08 LAB — BASIC METABOLIC PANEL
ANION GAP: 8 (ref 5–15)
BUN: 19 mg/dL (ref 6–20)
CALCIUM: 8.5 mg/dL — AB (ref 8.9–10.3)
CO2: 22 mmol/L (ref 22–32)
Chloride: 108 mmol/L (ref 101–111)
Creatinine, Ser: 1.91 mg/dL — ABNORMAL HIGH (ref 0.44–1.00)
GFR, EST AFRICAN AMERICAN: 30 mL/min — AB (ref >60)
GFR, EST NON AFRICAN AMERICAN: 26 mL/min — AB (ref >60)
Glucose, Bld: 116 mg/dL — ABNORMAL HIGH (ref 65–99)
POTASSIUM: 3.5 mmol/L (ref 3.5–5.1)
Sodium: 138 mmol/L (ref 135–145)

## 2017-10-08 LAB — CBC
HEMATOCRIT: 29.7 % — AB (ref 36.0–46.0)
HEMOGLOBIN: 10.2 g/dL — AB (ref 12.0–15.0)
MCH: 28 pg (ref 26.0–34.0)
MCHC: 34.3 g/dL (ref 30.0–36.0)
MCV: 81.6 fL (ref 78.0–100.0)
Platelets: 109 10*3/uL — ABNORMAL LOW (ref 150–400)
RBC: 3.64 MIL/uL — AB (ref 3.87–5.11)
RDW: 12.8 % (ref 11.5–15.5)
WBC: 4.7 10*3/uL (ref 4.0–10.5)

## 2017-10-08 LAB — I-STAT TROPONIN, ED: TROPONIN I, POC: 0.05 ng/mL (ref 0.00–0.08)

## 2017-10-08 NOTE — ED Triage Notes (Signed)
Pt complaint of left chest pain on and off since last night. Pt continues to verbalizes there is pain down arms and legs as well.

## 2017-10-08 NOTE — ED Notes (Signed)
Patient gave labels to registration and stated they were leaving.

## 2017-10-09 ENCOUNTER — Emergency Department (HOSPITAL_COMMUNITY): Payer: Medicare HMO

## 2017-10-09 ENCOUNTER — Other Ambulatory Visit: Payer: Self-pay

## 2017-10-09 ENCOUNTER — Ambulatory Visit (HOSPITAL_COMMUNITY)
Admission: EM | Admit: 2017-10-09 | Discharge: 2017-10-09 | Disposition: A | Discharge status: Nursing Home (Includes ICF) | Payer: Medicare HMO

## 2017-10-09 ENCOUNTER — Emergency Department (HOSPITAL_COMMUNITY)
Admission: EM | Admit: 2017-10-09 | Discharge: 2017-10-09 | Disposition: A | Discharge status: Home / Self Care | Payer: Medicare HMO | Attending: Emergency Medicine | Admitting: Emergency Medicine

## 2017-10-09 ENCOUNTER — Encounter (HOSPITAL_COMMUNITY): Payer: Self-pay

## 2017-10-09 DIAGNOSIS — R05 Cough: Secondary | ICD-10-CM | POA: Diagnosis not present

## 2017-10-09 DIAGNOSIS — R0602 Shortness of breath: Secondary | ICD-10-CM | POA: Insufficient documentation

## 2017-10-09 DIAGNOSIS — R079 Chest pain, unspecified: Secondary | ICD-10-CM | POA: Diagnosis present

## 2017-10-09 DIAGNOSIS — Z5321 Procedure and treatment not carried out due to patient leaving prior to being seen by health care provider: Secondary | ICD-10-CM | POA: Insufficient documentation

## 2017-10-09 DIAGNOSIS — M6281 Muscle weakness (generalized): Secondary | ICD-10-CM | POA: Insufficient documentation

## 2017-10-09 LAB — I-STAT TROPONIN, ED: Troponin i, poc: 0.04 ng/mL (ref 0.00–0.08)

## 2017-10-09 LAB — BASIC METABOLIC PANEL
ANION GAP: 12 (ref 5–15)
BUN: 23 mg/dL — ABNORMAL HIGH (ref 6–20)
CHLORIDE: 103 mmol/L (ref 101–111)
CO2: 23 mmol/L (ref 22–32)
Calcium: 8.2 mg/dL — ABNORMAL LOW (ref 8.9–10.3)
Creatinine, Ser: 2.19 mg/dL — ABNORMAL HIGH (ref 0.44–1.00)
GFR calc non Af Amer: 22 mL/min — ABNORMAL LOW (ref >60)
GFR, EST AFRICAN AMERICAN: 25 mL/min — AB (ref >60)
GLUCOSE: 129 mg/dL — AB (ref 65–99)
Potassium: 3.1 mmol/L — ABNORMAL LOW (ref 3.5–5.1)
Sodium: 138 mmol/L (ref 135–145)

## 2017-10-09 LAB — CBC
HCT: 28 % — ABNORMAL LOW (ref 36.0–46.0)
HEMOGLOBIN: 9.7 g/dL — AB (ref 12.0–15.0)
MCH: 28.5 pg (ref 26.0–34.0)
MCHC: 34.6 g/dL (ref 30.0–36.0)
MCV: 82.4 fL (ref 78.0–100.0)
Platelets: 132 10*3/uL — ABNORMAL LOW (ref 150–400)
RBC: 3.4 MIL/uL — AB (ref 3.87–5.11)
RDW: 13.4 % (ref 11.5–15.5)
WBC: 4.2 10*3/uL (ref 4.0–10.5)

## 2017-10-09 NOTE — ED Notes (Signed)
Staff saw patient leaving after updated on wait for treatment room.

## 2017-10-09 NOTE — ED Notes (Signed)
No answer in waiting area.

## 2017-10-09 NOTE — ED Triage Notes (Signed)
Pt states chest pain, shortness of breath, cough, weakness. States the chest pain not currently bothering her but she does have generalized soreness. Pt also reports cough. Skin warm and dry. No distress noted.

## 2017-10-09 NOTE — ED Provider Notes (Signed)
Patient placed in Quick Look pathway, seen and evaluated   Chief Complaint: chest pain, leg cramps  HPI:   72 y.o F hx HTN, presented to the ED today complaining of chest pain . She states that's yesterday around 11Am she felt sudden onset left sided sharp chest pain with assocaited SOB. This lasted for about 5 minutes and then resolved. She went to the ED yesterday but the wait was too long so she left. She continues to feellike her legs are cramping and weak making it difficult to walk.   ROS: + leg cramping, generalized weakness Negative for active chest pain, abdominal pain, fevers, chills, headache, vision change, paresthesias (one)  Physical Exam:   Gen: No distress  Neuro: Awake and Alert  Skin: Warm    Focused Exam: Neuro: strength equal bilaterally Heart: RRR, no peripheral edema Lung: CTAB Abd: soft nontender.   Will obtain EKG, troponin, CBC, BMP, CK, CXR   Initiation of care has begun. The patient has been counseled on the process, plan, and necessity for staying for the completion/evaluation, and the remainder of the medical screening examination    Carlos Levering, PA-C 10/09/17 1628    Sherwood Gambler, MD 10/09/17 2225

## 2017-10-12 NOTE — Progress Notes (Signed)
   [facility] Phone: [phone]   Date of Visit: 10/13/2017   HPI:  Chest Pain:  - patient reports that on Thursday, she had an episode of chest pain while doing some housework. She reports of left sided chest pain that she is unable to characterize but does not radiate. She is not sure if she had associated shortness of breath. She did have diaphoresis but no nausea. Symptoms lasted a few minutes - she went to the ED and had EKG and lab work done but did not wait to be seen by a provider. The next day she went to urgent care but left before being seen and on the same day went back to the ED but again left before being seen. Lab work was obtained at that time as well which were unremarkable (including istat troponin) EKGs from the 7th and 8th of this month showed atrial fibrillation with controlled rate. Her EKGs prior to this did not show Atrial Fibrillation. (of note, I noticed the EKG after the visit) - she reports she only had that one episode of chest pain on Thursday.  - she still uses tobacco about 3 cigarettes a day. The most she has smoked was 4 cigarettes a day     ROS: See HPI.  [place]:  PMH: HTN CKD III Tobacco Use  PHYSICAL EXAM: BP 132/60   Pulse 63   Temp 98 F (36.7 C) (Oral)   Wt 157 lb (71.2 kg)   SpO2 98%   BMI 26.95 kg/m  GEN: NAD HEENT: Atraumatic, normocephalic, neck supple, EOMI, sclera clear  CV: RRR, no murmurs, rubs, or gallops, no chest wall tenderness PULM: CTAB, normal effort ABD: Soft, nontender, nondistended, NABS, no organomegaly SKIN: No rash or cyanosis; warm and well-perfused EXTR: No lower extremity edema or calf tenderness PSYCH: Mood and affect euthymic, normal rate and volume of speech NEURO: Awake, alert, no focal deficits grossly, normal speech   ASSESSMENT/PLAN: 1. Chest pain, unspecified type One episode 5 days ago. Had EKG in the ED on the 7th and 8th with new onset Atrial Fibrillation with controlled  rate. istat troponins were negative at that time. Of note, patient left the ED/urgent care prior to being seen by a provider. Had risk factors that increases risk of cardiac etiology for chest pain. Also, due to new onset afib will make referral to cardiology - Ambulatory referral to Cardiology  2. Atrial fibrillation, unspecified type ([place]) New onset, normal rate. CHADSVASC 3 and HASBLED score of 3. High risk of bleeding due to history of GI bleed while on ASA. Will refer to cardiology. Will inform patient via phone.    Smiley Houseman, MD PGY [place]

## 2017-10-13 ENCOUNTER — Ambulatory Visit (INDEPENDENT_AMBULATORY_CARE_PROVIDER_SITE_OTHER): Payer: Medicare HMO | Admitting: Internal Medicine

## 2017-10-13 ENCOUNTER — Encounter: Payer: Self-pay | Admitting: Internal Medicine

## 2017-10-13 ENCOUNTER — Other Ambulatory Visit: Payer: Self-pay

## 2017-10-13 VITALS — BP 132/60 | HR 63 | Temp 98.0°F | Wt 157.0 lb

## 2017-10-13 DIAGNOSIS — I4891 Unspecified atrial fibrillation: Secondary | ICD-10-CM

## 2017-10-13 DIAGNOSIS — R079 Chest pain, unspecified: Secondary | ICD-10-CM

## 2017-10-13 HISTORY — DX: Unspecified atrial fibrillation: I48.91

## 2017-10-13 MED ORDER — NITROGLYCERIN 0.4 MG SL SUBL: 0.4000 mg | SUBLINGUAL_TABLET | SUBLINGUAL | 0 refills | Status: AC | PRN

## 2017-10-13 NOTE — Patient Instructions (Signed)
I made a referral to the heart doctor.  If you have chest pain again, you can take 1 tablet of the nitroglycerin, and if your chest pain does not go away, you need to go the emergency department.

## 2017-10-13 NOTE — Assessment & Plan Note (Signed)
Chadsvasc score of 3 and has bled score of 3. Patient is high risk for bleeding

## 2017-10-14 ENCOUNTER — Telehealth: Payer: Self-pay | Admitting: Internal Medicine

## 2017-10-14 NOTE — Telephone Encounter (Signed)
Called patient to discuss diagnosis of Atrial Fibrillation found on ED EKG. We discussed that her HR is stable currently. We discussed the risks and benefits of anticoagulation. She does have a high HASBLED risk. I would like Cards to determine if she should start anticoagulation. She has an appointment with cards tomorrow.

## 2017-10-15 ENCOUNTER — Ambulatory Visit: Payer: Medicare HMO | Admitting: Internal Medicine

## 2017-10-15 ENCOUNTER — Encounter: Payer: Self-pay | Admitting: Internal Medicine

## 2017-10-15 VITALS — BP 116/66 | HR 68 | Ht 64.0 in | Wt 156.8 lb

## 2017-10-15 DIAGNOSIS — I4891 Unspecified atrial fibrillation: Secondary | ICD-10-CM

## 2017-10-15 DIAGNOSIS — R079 Chest pain, unspecified: Secondary | ICD-10-CM | POA: Diagnosis not present

## 2017-10-15 DIAGNOSIS — F1910 Other psychoactive substance abuse, uncomplicated: Secondary | ICD-10-CM | POA: Diagnosis not present

## 2017-10-15 DIAGNOSIS — I10 Essential (primary) hypertension: Secondary | ICD-10-CM | POA: Diagnosis not present

## 2017-10-15 MED ORDER — AMLODIPINE BESYLATE 5 MG PO TABS: ORAL_TABLET | ORAL | 3 refills | Status: DC

## 2017-10-15 MED ORDER — METOPROLOL SUCCINATE ER 25 MG PO TB24: ORAL_TABLET | ORAL | 3 refills | Status: DC

## 2017-10-15 NOTE — Patient Instructions (Addendum)
Medication Instructions:  Take Amlodipine 5 mg by mouth daily Take 12.5 mg Toprol by mouth twice per day   -- If you need a refill on your cardiac medications before your next appointment, please call your pharmacy. --  Labwork: None ordered  Testing/Procedures: Your physician has requested that you have an echocardiogram. Echocardiography is a painless test that uses sound waves to create images of your heart. It provides your doctor with information about the size and shape of your heart and how well your heart's chambers and valves are working. This procedure takes approximately one hour. There are no restrictions for this procedure.   Follow-Up: Your physician wants you to follow-up in: 1 month with APP.      Thank you for choosing [facility]!!    Any Other Special Instructions Will Be Listed Below (If Applicable).  Follow up with PCP about GI bleed

## 2017-10-15 NOTE — Progress Notes (Signed)
New Outpatient Visit Date: 10/15/2017  Referring Provider: Smiley Houseman, [address] [place], [place] [address]  Chief Complaint: Chest pain  HPI:  Jennifer Foster is a 72 y.o. female who is being seen today for the evaluation of chest pain and atrial fibrillation at the request of Dr. Dallas Schimke. She has a history of hypertension, remote GI bleed, CKD stage 3, and prior substance abuse (stopped using cocaine approximately 1 year ago). Jennifer Foster was seen by her PCP 2 days ago, at which time she complained of chest pain that occurred while doing housework. She presented to the ED/urgent care on 3 separate occasions on 2/7 and 2/8, but left each time before being seen. Of note, EKG's at that time showed atrial fibrillation.  Today, Jennifer Foster reports feeling well.  On the day that she presented to the ED, she reports sudden onset of a "big grab" on the left side of her chest.  The pain only lasted a few minutes but was quite severe (8-9/10) and occurred while watching television.  The pain did not radiate nor was it accompanied by any symptoms.  Her husband took her to the emergency department, though by the time that she arrived there, the pain had already resolved.  Due to lengthy weight at both [facility] and [facility] emergency department, she left before being seen.  Jennifer Foster has not had any shortness of breath, palpitations, lightheadedness, orthopnea, PND, or edema.  Jennifer Foster denies a history of prior cardiac disease.  She underwent a pharmacologic myocardial perfusion stress test in 2016, which was normal.  She notes a GI bleed several years ago, after which she was told not to take aspirin ever again.  She does not recall further details.  She has not noticed any melena or hematochezia this morning  --------------------------------------------------------------------------------------------------  Cardiovascular History & Procedures: Cardiovascular Problems:  Atrial  fibrillation  Chest pain  Risk Factors:  Hypertension, tobacco use, and age > 33  Cath/PCI:  None  CV Surgery:  None  EP Procedures and Devices:  None  Non-Invasive Evaluation(s):  Pharmacologic MPI (11/22/14): Normal study without ischemia or scar. LVEF 56%.  Recent CV Pertinent Labs: Lab Results  Component Value Date   CHOL 149 08/07/2017   HDL 57 08/07/2017   LDLCALC 74 08/07/2017   LDLDIRECT 88 07/16/2011   TRIG 88 08/07/2017   CHOLHDL 2.6 08/07/2017   CHOLHDL 2.3 03/18/2016   INR 1.0 10/02/2007   K 3.1 (L) 10/09/2017   MG 2.0 08/07/2017   BUN 23 (H) 10/09/2017   BUN 18 08/07/2017   CREATININE 2.19 (H) 10/09/2017   CREATININE 1.47 (H) 02/02/2013    --------------------------------------------------------------------------------------------------  Past Medical History:  Diagnosis Date  . Acute blood loss anemia 06/26/2015  . Acute blood loss anemia 06/26/2015  . Asthma   . Chronic kidney disease (CKD), stage III (moderate) ([place]) 06/27/2015  . DIVERTICULAR BLEEDING, HX OF 10/14/2007   Colonoscopy 2008 showed diverticulosis.   [name] GERD (gastroesophageal reflux disease)   . Gout   . History of anemia   . History of asthma   . Hyperlipemia   . Hypertension   . Joint pain   . Lipoma   . SOB (shortness of breath) 03/18/2016    Past Surgical History:  Procedure Laterality Date  . ABDOMINAL HYSTERECTOMY  1981   partial, per pt history  . COLONOSCOPY N/A 12/08/2013   Procedure: COLONOSCOPY;  Surgeon: [name], MD;  Location: [facility];  Service:  Endoscopy;  Laterality: N/A;  . COLONOSCOPY N/A 06/28/2015   Procedure: COLONOSCOPY;  Surgeon: Clarene Essex, MD;  Location: [facility] ENDOSCOPY;  Service: Endoscopy;  Laterality: N/A;  . ESOPHAGOGASTRODUODENOSCOPY N/A 12/08/2013   Procedure: ESOPHAGOGASTRODUODENOSCOPY (EGD);  Surgeon: [name], MD;  Location: [facility] ENDOSCOPY;  Service: Endoscopy;  Laterality: N/A;  . GIVENS CAPSULE STUDY N/A 12/08/2013   Procedure:  GIVENS CAPSULE STUDY;  Surgeon: [name], MD;  Location: [facility];  Service: Endoscopy;  Laterality: N/A;  . LIPOMA EXCISION  01/28/11   neck    Current Meds  Medication Sig  . amLODipine (NORVASC) 10 MG tablet Take 1 tablet (10 mg total) by mouth daily.  . baclofen 5 MG TABS Take 5 mg by mouth 2 (two) times daily as needed for muscle spasms.  [name] HYDROcodone-acetaminophen (NORCO/VICODIN) 5-325 MG tablet Take 1-2 tablets by mouth every 6 (six) hours as needed.  . irbesartan (AVAPRO) 300 MG tablet Take 300 mg by mouth daily.   . nicotine (NICODERM CQ - DOSED IN MG/24 HR) 7 mg/24hr patch PLACE 1 PATCH (7 MG TOTAL) ONTO THE SKIN DAILY  . PARoxetine (PAXIL) 20 MG tablet Take 1 tablet (20 mg total) by mouth daily.  . rosuvastatin (CRESTOR) 20 MG tablet Take 1 tablet (20 mg total) by mouth at bedtime.    Allergies: Ace inhibitors; Lisinopril; and Tramadol  Social History   Socioeconomic History  . Marital status: Married    Spouse name: Not on file  . Number of children: Not on file  . Years of education: Not on file  . Highest education level: Not on file  Social Needs  . Financial resource strain: Not on file  . Food insecurity - worry: Not on file  . Food insecurity - inability: Not on file  . Transportation needs - medical: Not on file  . Transportation needs - non-medical: Not on file  Occupational History  . Occupation: unemployed  Tobacco Use  . Smoking status: Current Some Day Smoker    Packs/day: 0.25    Types: Cigarettes  . Smokeless tobacco: Never Used  Substance and Sexual Activity  . Alcohol use: Yes    Alcohol/week: 0.0 oz    Comment: occasionally  . Drug use: No  . Sexual activity: Yes  Other Topics Concern  . Not on file  Social History Narrative   Lives with husband Jennifer Foster who is also an [facility] patient.  History of Marijuana and crack use.  Had cocaine + in May 2012    Family History  Problem Relation Age of Onset  . Diabetes type II Unknown    . Kidney failure Unknown   . Kidney failure Son     Review of Systems: A 12-system review of systems was performed and was negative except as noted in the HPI.  --------------------------------------------------------------------------------------------------  Physical Exam: BP 116/66   Pulse 68   Ht 5\' 4"  (1.626 m)   Wt 156 lb 12.8 oz (71.1 kg)   SpO2 95%   BMI 26.91 kg/m   General: Overweight woman, seated comfortably in the exam room. HEENT: No conjunctival pallor or scleral icterus. Moist mucous membranes. OP clear with poor dentition. Neck: Supple without lymphadenopathy, thyromegaly, JVD, or HJR. No carotid bruit. Lungs: Normal work of breathing. Clear to auscultation bilaterally without wheezes or crackles. Heart: Regular rate and rhythm without murmurs, rubs, or gallops. Non-displaced PMI. Abd: Bowel sounds present. Soft, NT/ND without hepatosplenomegaly Ext: No lower extremity edema. Radial, PT, and DP pulses are  2+ bilaterally Skin: Warm and dry without rash. Neuro: CNIII-XII intact. Strength and fine-touch sensation intact in upper and lower extremities bilaterally. Psych: Normal mood.  Patient becomes tearful several times during the exam when discussing her family.  Of note, her son is currently incarcerated and she is caring for her great granddaughter.  EKG: Normal sinus rhythm with LVH and anterolateral ST/T wave changes suggestive of abnormal repolarization.  Sinus rhythm has replaced atrial fibrillation.  Lab Results  Component Value Date   WBC 4.2 10/09/2017   HGB 9.7 (L) 10/09/2017   HCT 28.0 (L) 10/09/2017   MCV 82.4 10/09/2017   PLT 132 (L) 10/09/2017    Lab Results  Component Value Date   NA 138 10/09/2017   K 3.1 (L) 10/09/2017   CL 103 10/09/2017   CO2 23 10/09/2017   BUN 23 (H) 10/09/2017   CREATININE 2.19 (H) 10/09/2017   GLUCOSE 129 (H) 10/09/2017   ALT 10 (L) 06/25/2015    Lab Results  Component Value Date   CHOL 149 08/07/2017    HDL 57 08/07/2017   LDLCALC 74 08/07/2017   LDLDIRECT 88 07/16/2011   TRIG 88 08/07/2017   CHOLHDL 2.6 08/07/2017     --------------------------------------------------------------------------------------------------  ASSESSMENT AND PLAN: Chest pain Jennifer Foster has expanse only one episode of brief but severe left-sided chest pain that led to several ED visits (though she was never actually seen by her provider).  EKGs at those visits were notable for atrial fibrillation.  I wonder if some of her pain may have been related to atrial fibrillation with rapid ventricular response in the setting of LVH.  Cardiac risk factors include hypertension, prior cocaine use, tobacco use, and age greater than 64.  We have agreed to begin with a transthoracic echocardiogram for further evaluation.  If LVEF is preserved and there are no regional wall motion abnormalities, we will then proceed with a myocardial perfusion stress test.  We will try to avoid catheterization if possible in the setting of CKD.  Paroxysmal atrial fibrillation Jennifer Foster is in sinus rhythm today but was noted to have atrial fibrillation with reasonable rate control last week.  Her CHADSVASC score is at least 47 (age, gender, and hypertension).  I have recommended that we begin metoprolol tartrate 12.5 mg twice daily and anticoagulation.  We discussed warfarin and novel oral anticoagulants.  Jennifer Foster is hesitant to begin any anticoagulation given her remote GI bleed and chronic anemia.  I have asked her to discuss this further with her PCP, including the need for GI and/or hematology evaluation.  Ultimately, I have recommend anticoagulation unless there is a strong indication to avoid this.  Hypertension Blood pressure is normal today.  Given that we are adding metoprolol for paroxysmal atrial fibrillation, I will have Jennifer Foster decrease amlodipine to 5 mg daily.  Polysubstance abuse Jennifer Foster continues to smoke and used cocaine up until a  year ago.  I have advised her to quit smoking and to remain abstinent from cocaine and other illicit drugs.  Follow-up: Return to clinic in 1 month  Nelva Bush, MD 10/15/2017 3:22 PM

## 2017-10-16 ENCOUNTER — Encounter: Payer: Self-pay | Admitting: Internal Medicine

## 2017-10-20 ENCOUNTER — Telehealth: Payer: Self-pay | Admitting: Internal Medicine

## 2017-10-20 MED ORDER — APIXABAN 5 MG PO TABS: 5.0000 mg | ORAL_TABLET | Freq: Two times a day (BID) | ORAL | 3 refills | Status: DC

## 2017-10-20 NOTE — Telephone Encounter (Signed)
-----   Message from Nelva Bush, MD sent at 10/16/2017  5:11 PM EST ----- Hello,  Thank you for referring Jennifer Foster for evaluation of chest pain and atrial fibrillation. I have recommended that we initiate anticoagulation (ideally a NOAC such as apixaban or rivaroxaban). However, Jennifer Foster is hesitant to proceed with this, given history of GI bleed and chronic anemia. I would appreciate it, if you could pursue further workup of the these issues as soon as possible so that we can make a decision about long-term anticoagulation. Please let me know if any questions or concerns arise. Thanks.  Chris End

## 2017-10-20 NOTE — Telephone Encounter (Signed)
Spoke with patient about starting anticoagulation. She is willing to start.  We discussed monitoring for side effects which include bleeding.  We will start Eliquis 5 mg twice daily.  Patient to follow-up in clinic in about 1 month to see how she is doing with her medications.

## 2017-10-22 ENCOUNTER — Other Ambulatory Visit: Payer: Self-pay

## 2017-10-22 ENCOUNTER — Ambulatory Visit (HOSPITAL_COMMUNITY): Payer: Medicare HMO | Attending: Cardiology

## 2017-10-22 DIAGNOSIS — I517 Cardiomegaly: Secondary | ICD-10-CM | POA: Diagnosis not present

## 2017-10-22 DIAGNOSIS — I4891 Unspecified atrial fibrillation: Secondary | ICD-10-CM | POA: Diagnosis not present

## 2017-10-22 DIAGNOSIS — R079 Chest pain, unspecified: Secondary | ICD-10-CM | POA: Diagnosis not present

## 2017-10-26 ENCOUNTER — Telehealth: Payer: Self-pay

## 2017-10-26 DIAGNOSIS — R079 Chest pain, unspecified: Secondary | ICD-10-CM

## 2017-10-26 NOTE — Telephone Encounter (Signed)
Informed patient that we are arranging test date and will be contacted

## 2017-11-06 ENCOUNTER — Encounter (HOSPITAL_COMMUNITY): Payer: Self-pay | Admitting: Internal Medicine

## 2017-11-09 ENCOUNTER — Telehealth (HOSPITAL_COMMUNITY): Payer: Self-pay | Admitting: *Deleted

## 2017-11-09 NOTE — Telephone Encounter (Signed)
Patient given detailed instructions per Myocardial Perfusion Study Information Sheet for the test on 11/12/17 at 0730. Patient notified to arrive 15 minutes early and that it is imperative to arrive on time for appointment to keep from having the test rescheduled.  If you need to cancel or reschedule your appointment, please call the office within 24 hours of your appointment. . Patient verbalized understanding.Aziah Brostrom, [place]anae [place]

## 2017-11-12 ENCOUNTER — Ambulatory Visit (INDEPENDENT_AMBULATORY_CARE_PROVIDER_SITE_OTHER): Payer: Medicare HMO | Admitting: Physician Assistant

## 2017-11-12 ENCOUNTER — Ambulatory Visit (HOSPITAL_COMMUNITY): Payer: Medicare HMO | Attending: Internal Medicine

## 2017-11-12 ENCOUNTER — Encounter: Payer: Self-pay | Admitting: Physician Assistant

## 2017-11-12 VITALS — BP 150/80 | HR 64 | Ht 64.0 in | Wt 157.0 lb

## 2017-11-12 DIAGNOSIS — I10 Essential (primary) hypertension: Secondary | ICD-10-CM

## 2017-11-12 DIAGNOSIS — R079 Chest pain, unspecified: Secondary | ICD-10-CM | POA: Diagnosis not present

## 2017-11-12 DIAGNOSIS — I480 Paroxysmal atrial fibrillation: Secondary | ICD-10-CM

## 2017-11-12 LAB — MYOCARDIAL PERFUSION IMAGING
CHL CUP NUCLEAR SDS: 7
CHL CUP NUCLEAR SSS: 13
LHR: 0.22
LV sys vol: 74 mL
LVDIAVOL: 149 mL (ref 46–106)
NUC STRESS TID: 1.03
Peak HR: 85 {beats}/min
Rest HR: 53 {beats}/min
SRS: 6

## 2017-11-12 MED ORDER — TECHNETIUM TC 99M TETROFOSMIN IV KIT
31.1000 | PACK | Freq: Once | INTRAVENOUS | Status: AC | PRN
  Administered 2017-11-12: 31.1 via INTRAVENOUS
  Filled 2017-11-12: qty 32

## 2017-11-12 MED ORDER — REGADENOSON 0.4 MG/5ML IV SOLN
0.4000 mg | Freq: Once | INTRAVENOUS | Status: AC
  Administered 2017-11-12: 0.4 mg via INTRAVENOUS

## 2017-11-12 MED ORDER — TECHNETIUM TC 99M TETROFOSMIN IV KIT
10.2000 | PACK | Freq: Once | INTRAVENOUS | Status: AC | PRN
  Administered 2017-11-12: 10.2 via INTRAVENOUS
  Filled 2017-11-12: qty 11

## 2017-11-12 MED ORDER — AMLODIPINE BESYLATE 5 MG PO TABS: ORAL_TABLET | ORAL | 0 refills | Status: DC

## 2017-11-12 NOTE — Patient Instructions (Signed)
Medication Instructions:  Your physician has recommended you make the following change in your medication:   1. Amlodipine (7.5 mg) by mouth once daily. (Dose change)  Labwork: None ordered  Testing/Procedures: None ordered  Follow-Up: Your physician recommends that you schedule a follow-up appointment February 15, 2018 at 9:40 am with Dr. Saunders Revel  Any Other Special Instructions Will Be Listed Below (If Applicable).  If you need a refill on your cardiac medications before your next appointment, please call your pharmacy.

## 2017-11-12 NOTE — Progress Notes (Signed)
Cardiology Office Note    Date:  11/12/2017   ID:  Jennifer Foster, DOB 20-Jun-1946, MRN [id number]  PCP:  [name], MD  Cardiologist: Dr. Saunders Revel  Chief Complaint: 1  Months follow up  History of Present Illness:   Jennifer Foster is a 72 y.o. female with hx of hypertension, remote GI bleed, CKD stage 3, afib, ongoing tobacco abuse and chest pain presents for follow up.   Seen by Dr. Saunders Revel 10/15/17 to establish cardiac care for chest pain and atrial fibrillation. ? Her chest pain may have been related to atrial fibrillation with rapid ventricular response in the setting of LVH. Her CHADSVASC score is at least 3. Recommended anticoagulation however patient has declined given her remote GI bleed and chronic anemia. After discussion with PCP, the patient has agree to start Eliquis 5mg  BID.   Stress test done today, pending final result.  No recurrent chest pain.  Denies palpitation, shortness of breath, orthopnea, PND or syncope.  No melena.  She has missed one dose of Eliquis few days ago, non since then.  She has blood pressure machine at home but does not check regularly.  Past Medical History:  Diagnosis Date  . Acute blood loss anemia 06/26/2015  . Acute blood loss anemia 06/26/2015  . Asthma   . Chronic kidney disease (CKD), stage III (moderate) ([place]) 06/27/2015  . DIVERTICULAR BLEEDING, HX OF 10/14/2007   Colonoscopy 2008 showed diverticulosis.   [name] GERD (gastroesophageal reflux disease)   . Gout   . History of anemia   . History of asthma   . Hyperlipemia   . Hypertension   . Joint pain   . Lipoma   . SOB (shortness of breath) 03/18/2016    Past Surgical History:  Procedure Laterality Date  . ABDOMINAL HYSTERECTOMY  1981   partial, per pt history  . COLONOSCOPY N/A 12/08/2013   Procedure: COLONOSCOPY;  Surgeon: [name], MD;  Location: [facility];  Service: Endoscopy;  Laterality: N/A;  . COLONOSCOPY N/A 06/28/2015   Procedure: COLONOSCOPY;  Surgeon: Clarene Essex,  MD;  Location: [facility] ENDOSCOPY;  Service: Endoscopy;  Laterality: N/A;  . ESOPHAGOGASTRODUODENOSCOPY N/A 12/08/2013   Procedure: ESOPHAGOGASTRODUODENOSCOPY (EGD);  Surgeon: [name], MD;  Location: [facility] ENDOSCOPY;  Service: Endoscopy;  Laterality: N/A;  . GIVENS CAPSULE STUDY N/A 12/08/2013   Procedure: GIVENS CAPSULE STUDY;  Surgeon: [name], MD;  Location: [facility];  Service: Endoscopy;  Laterality: N/A;  . LIPOMA EXCISION  01/28/11   neck    Current Medications: Prior to Admission medications   Medication Sig Start Date End Date Taking? Authorizing Provider  amLODipine (NORVASC) 5 MG tablet Take 1 tablet by mouth daily 10/15/17   End, Harrell Gave, MD  apixaban (ELIQUIS) 5 MG TABS tablet Take 1 tablet (5 mg total) by mouth 2 (two) times daily. 10/20/17   Smiley Houseman, MD  baclofen 5 MG TABS Take 5 mg by mouth 2 (two) times daily as needed for muscle spasms. 08/07/17   [name], MD  HYDROcodone-acetaminophen (NORCO/VICODIN) 5-325 MG tablet Take 1-2 tablets by mouth every 6 (six) hours as needed. 07/02/17   Volanda Napoleon, PA-C  irbesartan (AVAPRO) 300 MG tablet Take 300 mg by mouth daily.  02/20/17   [provider]  metoprolol succinate (TOPROL XL) 25 MG 24 hr tablet Take half tablet by mouth twice a day 10/15/17   End, Harrell Gave, MD  nicotine (NICODERM CQ - DOSED IN MG/24 HR)  7 mg/24hr patch PLACE 1 PATCH (7 MG TOTAL) ONTO THE SKIN DAILY 02/09/17   [name], MD  nitroGLYCERIN (NITROSTAT) 0.4 MG SL tablet Place 1 tablet (0.4 mg total) under the tongue every 5 (five) minutes as needed for chest pain. If do not resolve after 1 tablet, go to ED Patient not taking: Reported on 10/15/2017 10/13/17   Smiley Houseman, MD  PARoxetine (PAXIL) 20 MG tablet Take 1 tablet (20 mg total) by mouth daily. 05/27/17   [name], MD  rosuvastatin (CRESTOR) 20 MG tablet Take 1 tablet (20 mg total) by mouth at bedtime. 07/01/17   [name], MD     Allergies:   Ace inhibitors; Lisinopril; and Tramadol   Social History   Socioeconomic History  . Marital status: Married    Spouse name: None  . Number of children: None  . Years of education: None  . Highest education level: None  Social Needs  . Financial resource strain: None  . Food insecurity - worry: None  . Food insecurity - inability: None  . Transportation needs - medical: None  . Transportation needs - non-medical: None  Occupational History  . Occupation: unemployed  Tobacco Use  . Smoking status: Current Some Day Smoker    Packs/day: 0.25    Types: Cigarettes  . Smokeless tobacco: Never Used  Substance and Sexual Activity  . Alcohol use: Yes    Alcohol/week: 0.0 oz    Comment: occasionally  . Drug use: No  . Sexual activity: Yes  Other Topics Concern  . None  Social History Narrative   Lives with husband Arista Kettlewell who is also an [facility] patient.  History of Marijuana and crack use.  Had cocaine + in May 2012     Family History:  The patient's family history includes Diabetes type II in her unknown relative; Kidney failure in her son and unknown relative.   ROS:   Please see the history of present illness.    ROS All other systems reviewed and are negative.   PHYSICAL EXAM:   VS:  BP (!) 150/80 (BP Location: Left Arm, Patient Position: Sitting, Cuff Size: Normal)   Pulse 64   Ht 5\' 4"  (1.626 m)   Wt 157 lb (71.2 kg)   SpO2 98%   BMI 26.95 kg/m    GEN: Well nourished, well developed, in no acute distress  HEENT: normal  Neck: no JVD, carotid bruits, or masses Cardiac: RRR; no murmurs, rubs, or gallops,no edema  Respiratory:  clear to auscultation bilaterally, normal work of breathing GI: soft, nontender, nondistended, + BS MS: no deformity or atrophy  Skin: warm and dry, no rash Neuro:  Alert and Oriented x 3, Strength and sensation are intact Psych: euthymic mood, full affect  Wt Readings from Last 3 Encounters:  11/12/17 157 lb (71.2 kg)   11/12/17 156 lb (70.8 kg)  10/15/17 156 lb 12.8 oz (71.1 kg)      Studies/Labs Reviewed:   EKG:  EKG is not ordered today.    Recent Labs: 04/14/2017: TSH 0.884 08/07/2017: Magnesium 2.0 10/09/2017: BUN 23; Creatinine, Ser 2.19; Hemoglobin 9.7; Platelets 132; Potassium 3.1; Sodium 138   Lipid Panel    Component Value Date/Time   CHOL 149 08/07/2017 1036   TRIG 88 08/07/2017 1036   HDL 57 08/07/2017 1036   CHOLHDL 2.6 08/07/2017 1036   CHOLHDL 2.3 03/18/2016 1120   VLDL 32 (H) 03/18/2016 1120   LDLCALC 74 08/07/2017 1036  LDLDIRECT 88 07/16/2011 1138    Additional studies/ records that were reviewed today include:   As above    ASSESSMENT & PLAN:    1. Chest pain -No recurrent symptoms.  Pending stress test today.  Not on aspirin due to need of anticoagulation.  2.  Paroxysmal atrial fibrillation -Noted in sinus rhythm by exam today.  Denies palpitation, shortness of breath or dizziness.  Encourage compliance with Eliquis.  No blood in her stool or melena.  3.  Hypertension -Elevated during office visit.  Initially 150/80.  Repeat check 156/86.  We will increase her amlodipine to 7.5 mg daily.  Continue Toprol-XL at current dose of 12.5 mg daily.  Medication Adjustments/Labs and Tests Ordered: Current medicines are reviewed at length with the patient today.  Concerns regarding medicines are outlined above.  Medication changes, Labs and Tests ordered today are listed in the Patient Instructions below. Patient Instructions  Medication Instructions:  Your physician has recommended you make the following change in your medication:   1. Amlodipine (7.5 mg) by mouth once daily. (Dose change)  Labwork: None ordered  Testing/Procedures: None ordered  Follow-Up: Your physician recommends that you schedule a follow-up appointment February 15, 2018 at 9:40 am with Dr. Saunders Revel  Any Other Special Instructions Will Be Listed Below (If Applicable).  If you need a refill on your  cardiac medications before your next appointment, please call your pharmacy.      Jarrett Soho, [place]  11/12/2017 3:15 PM    [facility] [address], [place], [place]  [address] Phone: [phone]; Fax: [phone]

## 2017-11-16 ENCOUNTER — Other Ambulatory Visit: Payer: Self-pay

## 2017-11-16 ENCOUNTER — Telehealth: Payer: Self-pay | Admitting: Internal Medicine

## 2017-11-16 DIAGNOSIS — Z01812 Encounter for preprocedural laboratory examination: Secondary | ICD-10-CM

## 2017-11-16 NOTE — Telephone Encounter (Signed)
I reviewed the results of Jennifer Foster's stress test with her.  It was intermediate risk with evidence of possible anterior ischemia as well as inferior infarct.  Jennifer Foster reports feeling well without any further episodes of chest pain.  Given her history of renal insufficiency, we have agreed to recheck a basic metabolic panel in the next week and then have her follow-up with me in the office next Thursday (3/28) to discuss further options, including cardiac catheterization and stress testing.  I advised her to seek immediate medical attention if she has chest pain in the meantime.  Nelva Bush, MD [facility] Pager: [phone]

## 2017-11-24 ENCOUNTER — Other Ambulatory Visit: Payer: Medicare HMO | Admitting: *Deleted

## 2017-11-24 DIAGNOSIS — Z01812 Encounter for preprocedural laboratory examination: Secondary | ICD-10-CM

## 2017-11-24 LAB — BASIC METABOLIC PANEL
BUN/Creatinine Ratio: 9 — ABNORMAL LOW (ref 12–28)
BUN: 11 mg/dL (ref 8–27)
CO2: 20 mmol/L (ref 20–29)
CREATININE: 1.28 mg/dL — AB (ref 0.57–1.00)
Calcium: 8.6 mg/dL — ABNORMAL LOW (ref 8.7–10.3)
Chloride: 108 mmol/L — ABNORMAL HIGH (ref 96–106)
GFR calc Af Amer: 49 mL/min/{1.73_m2} — ABNORMAL LOW (ref >59)
GFR, EST NON AFRICAN AMERICAN: 43 mL/min/{1.73_m2} — AB (ref >59)
Glucose: 102 mg/dL — ABNORMAL HIGH (ref 65–99)
Potassium: 3.8 mmol/L (ref 3.5–5.2)
Sodium: 142 mmol/L (ref 134–144)

## 2017-11-24 LAB — CBC
HEMOGLOBIN: 10.6 g/dL — AB (ref 11.1–15.9)
Hematocrit: 31.1 % — ABNORMAL LOW (ref 34.0–46.6)
MCH: 27.7 pg (ref 26.6–33.0)
MCHC: 34.1 g/dL (ref 31.5–35.7)
MCV: 81 fL (ref 79–97)
PLATELETS: 194 10*3/uL (ref 150–379)
RBC: 3.82 x10E6/uL (ref 3.77–5.28)
RDW: 15.8 % — ABNORMAL HIGH (ref 12.3–15.4)
WBC: 5.3 10*3/uL (ref 3.4–10.8)

## 2017-11-24 LAB — PROTIME-INR
INR: 1 (ref 0.8–1.2)
Prothrombin Time: 10.7 s (ref 9.1–12.0)

## 2017-11-26 ENCOUNTER — Ambulatory Visit (INDEPENDENT_AMBULATORY_CARE_PROVIDER_SITE_OTHER): Payer: Medicare HMO | Admitting: Internal Medicine

## 2017-11-26 ENCOUNTER — Encounter: Payer: Self-pay | Admitting: Internal Medicine

## 2017-11-26 VITALS — BP 140/72 | HR 64 | Ht 64.0 in | Wt 151.0 lb

## 2017-11-26 DIAGNOSIS — Z01812 Encounter for preprocedural laboratory examination: Secondary | ICD-10-CM

## 2017-11-26 DIAGNOSIS — I480 Paroxysmal atrial fibrillation: Secondary | ICD-10-CM | POA: Diagnosis not present

## 2017-11-26 DIAGNOSIS — I2510 Atherosclerotic heart disease of native coronary artery without angina pectoris: Secondary | ICD-10-CM | POA: Diagnosis not present

## 2017-11-26 DIAGNOSIS — I10 Essential (primary) hypertension: Secondary | ICD-10-CM | POA: Diagnosis not present

## 2017-11-26 DIAGNOSIS — E785 Hyperlipidemia, unspecified: Secondary | ICD-10-CM | POA: Diagnosis not present

## 2017-11-26 DIAGNOSIS — N183 Chronic kidney disease, stage 3 unspecified: Secondary | ICD-10-CM

## 2017-11-26 NOTE — H&P (View-Only) (Signed)
Follow-up Outpatient Visit Date: 11/26/2017  Primary Care Provider: [name], MD [place] [address]  Chief Complaint: Follow-up abnormal stress test  HPI:  Jennifer Foster is a 72 y.o. year-old female with history of paroxysmal atrial fibrillation, hypertension, chronic anemia and remote GI bleed, CKD stage 3, and prior substance abuse (stopped using cocaine ~1 year ago), who presents for follow-up of atrial fibrillation and abnormal stress test.  I met [name] last month after she presented to the ED/urgent care several times with chest pain.  She was noted to be in atrial fibrillation but wound up leaving before she was ever seen by a provider.  At the time of our clinic visit on 2/14, she was back in sinus rhythm and feeling well.  She followed up with Robbie Lis, PA, on 3/14 and agreed to start apixaban.  Myocardial perfusion stress test was also performed that day and revealed a partially reversible defect in the anterior wall concerning for ischemia.  Today, [name] reports feeling well except for diarrhea that began a couple of days ago. She has noted dark, loose stools after drinking milk that she thinks was bad. She has used Pepto-Bismol with some relief. She denies BRBPR, abdominal pain, nausea, vomiting, and fevers/chills.  [name] has not had any further episodes of chest pain.  She also denies shortness of breath, palpitations, and lightheadedness.  She is tolerating apixaban well.  --------------------------------------------------------------------------------------------------  Cardiovascular History & Procedures: Cardiovascular Problems:  Paroxysmal atrial fibrillation  Chest pain  Risk Factors:  Hypertension, tobacco use, and age > 82  Cath/PCI:  None  CV Surgery:  None  EP Procedures and Devices:  None  Non-Invasive Evaluation(s):  Pharmacologic MPI (11/12/17): Intermediate risk study with moderate in size, moderate  in severity, partially reversible anterior defect. Small, fixed basal/mid inerior defect suggestive of scar and/or artifact. LVEF 50% with inferior hypokinesis.  TTE (10/22/17): Normal LV size with moderate LVH. LVEF 60-65% with grade 2 diastolic dysfunction. Moderately to severely dilated LA. Normal RV size and function. Normal PA and central venous pressures.  Pharmacologic MPI (11/22/14): Normal study without ischemia or scar. LVEF 56%.   Recent CV Pertinent Labs: Lab Results  Component Value Date   CHOL 149 08/07/2017   HDL 57 08/07/2017   LDLCALC 74 08/07/2017   LDLDIRECT 88 07/16/2011   TRIG 88 08/07/2017   CHOLHDL 2.6 08/07/2017   CHOLHDL 2.3 03/18/2016   INR 1.0 11/24/2017   K 3.8 11/24/2017   MG 2.0 08/07/2017   BUN 11 11/24/2017   CREATININE 1.28 (H) 11/24/2017   CREATININE 1.47 (H) 02/02/2013    Past medical and surgical history were reviewed and updated in EPIC.  Current Meds  Medication Sig  . amLODipine (NORVASC) 5 MG tablet Take 7.5 mg (1.5 tablets) by mouth daily (Patient taking differently: Take 12.5 mg by mouth daily. )  . apixaban (ELIQUIS) 5 MG TABS tablet Take 1 tablet (5 mg total) by mouth 2 (two) times daily.  . baclofen 5 MG TABS Take 5 mg by mouth 2 (two) times daily as needed for muscle spasms.  [name] HYDROcodone-acetaminophen (NORCO/VICODIN) 5-325 MG tablet Take 1-2 tablets by mouth every 6 (six) hours as needed. (Patient not taking: Reported on 11/26/2017)  . irbesartan (AVAPRO) 300 MG tablet Take 300 mg by mouth daily.   . metoprolol succinate (TOPROL XL) 25 MG 24 hr tablet Take half tablet by mouth twice a day (Patient taking differently: Take 25 mg by  mouth daily. )  . nicotine (NICODERM CQ - DOSED IN MG/24 HR) 7 mg/24hr patch PLACE 1 PATCH (7 MG TOTAL) ONTO THE SKIN DAILY (Patient not taking: Reported on 11/26/2017)  . nitroGLYCERIN (NITROSTAT) 0.4 MG SL tablet Place 1 tablet (0.4 mg total) under the tongue every 5 (five) minutes as needed for chest pain.  If do not resolve after 1 tablet, go to ED  . rosuvastatin (CRESTOR) 20 MG tablet Take 1 tablet (20 mg total) by mouth at bedtime.    Allergies: Ace inhibitors; Lisinopril; and Tramadol  Social History   Socioeconomic History  . Marital status: Married    Spouse name: Not on file  . Number of children: Not on file  . Years of education: Not on file  . Highest education level: Not on file  Occupational History  . Occupation: unemployed  Social Needs  . Financial resource strain: Not on file  . Food insecurity:    Worry: Not on file    Inability: Not on file  . Transportation needs:    Medical: Not on file    Non-medical: Not on file  Tobacco Use  . Smoking status: Current Some Day Smoker    Packs/day: 0.25    Types: Cigarettes  . Smokeless tobacco: Never Used  Substance and Sexual Activity  . Alcohol use: Yes    Alcohol/week: 0.0 oz    Comment: occasionally  . Drug use: No  . Sexual activity: Yes  Lifestyle  . Physical activity:    Days per week: Not on file    Minutes per session: Not on file  . Stress: Not on file  Relationships  . Social connections:    Talks on phone: Not on file    Gets together: Not on file    Attends religious service: Not on file    Active member of club or organization: Not on file    Attends meetings of clubs or organizations: Not on file    Relationship status: Not on file  . Intimate partner violence:    Fear of current or ex partner: Not on file    Emotionally abused: Not on file    Physically abused: Not on file    Forced sexual activity: Not on file  Other Topics Concern  . Not on file  Social History Narrative   Lives with husband Jennifer Foster who is also an [facility] patient.  History of Marijuana and crack use.  Had cocaine + in May 2012    Family History  Problem Relation Age of Onset  . Diabetes type II Unknown   . Kidney failure Unknown   . Kidney failure Son     Review of Systems: A 12-system review of systems was  performed and was negative except as noted in the HPI.  --------------------------------------------------------------------------------------------------  Physical Exam: BP 140/72   Pulse 64   Ht '5\' 4"'  (1.626 m)   Wt 151 lb (68.5 kg)   SpO2 97%   BMI 25.92 kg/m   General:  Appears anxious and hurried. She becomes tearful several times during the visit. HEENT: No conjunctival pallor or scleral icterus. Moist mucous membranes.  OP clear. Neck: Supple without lymphadenopathy, thyromegaly, JVD, or HJR. Lungs: Normal work of breathing. Clear to auscultation bilaterally without wheezes or crackles. Heart: Regular rate and rhythm without murmurs, rubs, or gallops. Non-displaced PMI. Abd: Bowel sounds present. Soft, NT/ND without hepatosplenomegaly Ext: No lower extremity edema. 2+ radial pulses. Skin: Warm and dry without rash.  EKG:  NSR with LVH and abnormal repolarization.  Lab Results  Component Value Date   WBC 5.3 11/24/2017   HGB 10.6 (L) 11/24/2017   HCT 31.1 (L) 11/24/2017   MCV 81 11/24/2017   PLT 194 11/24/2017    Lab Results  Component Value Date   NA 142 11/24/2017   K 3.8 11/24/2017   CL 108 (H) 11/24/2017   CO2 20 11/24/2017   BUN 11 11/24/2017   CREATININE 1.28 (H) 11/24/2017   GLUCOSE 102 (H) 11/24/2017   ALT 10 (L) 06/25/2015    Lab Results  Component Value Date   CHOL 149 08/07/2017   HDL 57 08/07/2017   LDLCALC 74 08/07/2017   LDLDIRECT 88 07/16/2011   TRIG 88 08/07/2017   CHOLHDL 2.6 08/07/2017    --------------------------------------------------------------------------------------------------  ASSESSMENT AND PLAN: Coronary artery disease without angina No further chest pain since Ms. Meany presented to the ED/urgent care on 2/7 and 2/8.  However, stress test was abnormal with evidence of anterior ischemia and possible inferior scar vs. artifact. We have discussed further evaluation and treatment options, including medical therapy, coronary  CTA, and cardiac catheterization; we have agreed to proceed with catheterization.  I have reviewed the risks, indications, and alternatives to cardiac catheterization, possible angioplasty, and stenting with the patient. Risks include but are not limited to bleeding, infection, vascular injury, stroke, myocardial infection, arrhythmia, kidney injury, radiation-related injury in the case of prolonged fluoroscopy use, emergency cardiac surgery, and death. The patient understands the risks of serious complication is 1-2 in 1287 with diagnostic cardiac cath and 1-2% or less with angioplasty/stenting.  Due to CKD (creatinine back to baseline on check earlier this week), I will have Ms. Daugherty come in 4 hours early for prehydration.  I will also recheck a CBC and BMP on the morning of the procedure.  She will need to hold apixaban 2 days before the catheterization and begin taking ASA 81 mg daily the day before the procedure.  Paroxysmal atrial fibrillation No evidence of recurrence.  EKG today shows NSR.  Continue apixaban given CHADSVASC score of 3-4 (gender, age, hypertension, +/- CAD). We will also continue current dose of metoprolol.  Hyperlipidemia Continue rosuvastatin 20 mg daily.  Essential hypertensin BP mildly elevated.  I encouraged sodium restriction.  Continue irbesartan, amlodipine, and metoprolol.  CKD stage 3 Repeat creatinine back to baseline following AKI at the time of chest pain and a-fib in February.  Continue current medications with plan for prehydration prior to cath, as outlined above.  Follow-up: TBD based on results of cardiac catheterization.  Nelva Bush, MD 11/27/2017 7:04 AM

## 2017-11-26 NOTE — Patient Instructions (Addendum)
Medication Instructions:  DO NOT TAKE ELIQUIS AFTER MONDAY DOSE  TAKE Aspirin 81 mg STARTING Monday MORNING   -- If you need a refill on your cardiac medications before your next appointment, please call your pharmacy. --  Labwork: Day of procedure BMET CBC   Testing/Procedures: Your physician has requested that you have a cardiac catheterization. Cardiac catheterization is used to diagnose and/or treat various heart conditions. Doctors may recommend this procedure for a number of different reasons. The most common reason is to evaluate chest pain. Chest pain can be a symptom of coronary artery disease (CAD), and cardiac catheterization can show whether plaque is narrowing or blocking your heart's arteries. This procedure is also used to evaluate the valves, as well as measure the blood flow and oxygen levels in different parts of your heart. For further information please visit [email or web address]. Please follow instruction sheet, as given.   Follow-Up:  Your physician wants you to follow-up post Cath   Thank you for choosing [facility]!!    Any Other Special Instructions Will Be Listed Below (If Applicable).    Jennifer Foster OFFICE [address] [place] [address] [place] [address] Dept: [phone] Loc: [phone]  Jennifer Foster  11/26/2017  You are scheduled for a Cardiac Catheterization on Thursday, April 4 with Dr. Harrell Gave End.  1. Please arrive at the [facility] (Main Entrance A) at [facility]: [place], [place] [address] at 7:30 AM (two hours before your procedure to ensure your preparation). Free valet parking service is available.   Special note: Every effort is made to have your procedure done on time. Please understand that emergencies sometimes delay scheduled procedures.  2. Diet: Do not eat or drink anything after midnight prior to your procedure  except sips of water to take medications.  3. Labs: Your labs will be performed at the hospital after you arrive for your procedure.   *For reference purposes while preparing patient instructions.   Delete this med list prior to printing instructions for patient.*  Stop taking Eliquis (Apixiban) on Tuesday, April 2.    Three days before your procedure, take your Aspirin 81 mg    5. Plan for one night stay--bring personal belongings. 6. Bring a current list of your medications and current insurance cards. 7. You MUST have a responsible person to drive you home. 8. Someone MUST be with you the first 24 hours after you arrive home or your discharge will be delayed. 9. Please wear clothes that are easy to get on and off and wear slip-on shoes.  Thank you for allowing [place] to care for you!   -- [facility]

## 2017-11-26 NOTE — Progress Notes (Signed)
Follow-up Outpatient Visit Date: 11/26/2017  Primary Care Provider: [name], MD [place] [address]  Chief Complaint: Follow-up abnormal stress test  HPI:  Jennifer Foster is a 72 y.o. year-old female with history of paroxysmal atrial fibrillation, hypertension, chronic anemia and remote GI bleed, CKD stage 3, and prior substance abuse (stopped using cocaine ~1 year ago), who presents for follow-up of atrial fibrillation and abnormal stress test.  I met Ms. Meter last month after she presented to the ED/urgent care several times with chest pain.  She was noted to be in atrial fibrillation but wound up leaving before she was ever seen by a provider.  At the time of our clinic visit on 2/14, she was back in sinus rhythm and feeling well.  She followed up with Robbie Lis, PA, on 3/14 and agreed to start apixaban.  Myocardial perfusion stress test was also performed that day and revealed a partially reversible defect in the anterior wall concerning for ischemia.  Today, [name] reports feeling well except for diarrhea that began a couple of days ago. She has noted dark, loose stools after drinking milk that she thinks was bad. She has used Pepto-Bismol with some relief. She denies BRBPR, abdominal pain, nausea, vomiting, and fevers/chills.  [name] has not had any further episodes of chest pain.  She also denies shortness of breath, palpitations, and lightheadedness.  She is tolerating apixaban well.  --------------------------------------------------------------------------------------------------  Cardiovascular History & Procedures: Cardiovascular Problems:  Paroxysmal atrial fibrillation  Chest pain  Risk Factors:  Hypertension, tobacco use, and age > 37  Cath/PCI:  None  CV Surgery:  None  EP Procedures and Devices:  None  Non-Invasive Evaluation(s):  Pharmacologic MPI (11/12/17): Intermediate risk study with moderate in size, moderate  in severity, partially reversible anterior defect. Small, fixed basal/mid inerior defect suggestive of scar and/or artifact. LVEF 50% with inferior hypokinesis.  TTE (10/22/17): Normal LV size with moderate LVH. LVEF 60-65% with grade 2 diastolic dysfunction. Moderately to severely dilated LA. Normal RV size and function. Normal PA and central venous pressures.  Pharmacologic MPI (11/22/14): Normal study without ischemia or scar. LVEF 56%.   Recent CV Pertinent Labs: Lab Results  Component Value Date   CHOL 149 08/07/2017   HDL 57 08/07/2017   LDLCALC 74 08/07/2017   LDLDIRECT 88 07/16/2011   TRIG 88 08/07/2017   CHOLHDL 2.6 08/07/2017   CHOLHDL 2.3 03/18/2016   INR 1.0 11/24/2017   K 3.8 11/24/2017   MG 2.0 08/07/2017   BUN 11 11/24/2017   CREATININE 1.28 (H) 11/24/2017   CREATININE 1.47 (H) 02/02/2013    Past medical and surgical history were reviewed and updated in EPIC.  Current Meds  Medication Sig  . amLODipine (NORVASC) 5 MG tablet Take 7.5 mg (1.5 tablets) by mouth daily (Patient taking differently: Take 12.5 mg by mouth daily. )  . apixaban (ELIQUIS) 5 MG TABS tablet Take 1 tablet (5 mg total) by mouth 2 (two) times daily.  . baclofen 5 MG TABS Take 5 mg by mouth 2 (two) times daily as needed for muscle spasms.  [name] HYDROcodone-acetaminophen (NORCO/VICODIN) 5-325 MG tablet Take 1-2 tablets by mouth every 6 (six) hours as needed. (Patient not taking: Reported on 11/26/2017)  . irbesartan (AVAPRO) 300 MG tablet Take 300 mg by mouth daily.   . metoprolol succinate (TOPROL XL) 25 MG 24 hr tablet Take half tablet by mouth twice a day (Patient taking differently: Take 25 mg by  mouth daily. )  . nicotine (NICODERM CQ - DOSED IN MG/24 HR) 7 mg/24hr patch PLACE 1 PATCH (7 MG TOTAL) ONTO THE SKIN DAILY (Patient not taking: Reported on 11/26/2017)  . nitroGLYCERIN (NITROSTAT) 0.4 MG SL tablet Place 1 tablet (0.4 mg total) under the tongue every 5 (five) minutes as needed for chest pain.  If do not resolve after 1 tablet, go to ED  . rosuvastatin (CRESTOR) 20 MG tablet Take 1 tablet (20 mg total) by mouth at bedtime.    Allergies: Ace inhibitors; Lisinopril; and Tramadol  Social History   Socioeconomic History  . Marital status: Married    Spouse name: Not on file  . Number of children: Not on file  . Years of education: Not on file  . Highest education level: Not on file  Occupational History  . Occupation: unemployed  Social Needs  . Financial resource strain: Not on file  . Food insecurity:    Worry: Not on file    Inability: Not on file  . Transportation needs:    Medical: Not on file    Non-medical: Not on file  Tobacco Use  . Smoking status: Current Some Day Smoker    Packs/day: 0.25    Types: Cigarettes  . Smokeless tobacco: Never Used  Substance and Sexual Activity  . Alcohol use: Yes    Alcohol/week: 0.0 oz    Comment: occasionally  . Drug use: No  . Sexual activity: Yes  Lifestyle  . Physical activity:    Days per week: Not on file    Minutes per session: Not on file  . Stress: Not on file  Relationships  . Social connections:    Talks on phone: Not on file    Gets together: Not on file    Attends religious service: Not on file    Active member of club or organization: Not on file    Attends meetings of clubs or organizations: Not on file    Relationship status: Not on file  . Intimate partner violence:    Fear of current or ex partner: Not on file    Emotionally abused: Not on file    Physically abused: Not on file    Forced sexual activity: Not on file  Other Topics Concern  . Not on file  Social History Narrative   Lives with husband Jennifer Foster who is also an [facility] patient.  History of Marijuana and crack use.  Had cocaine + in May 2012    Family History  Problem Relation Age of Onset  . Diabetes type II Unknown   . Kidney failure Unknown   . Kidney failure Son     Review of Systems: A 12-system review of systems was  performed and was negative except as noted in the HPI.  --------------------------------------------------------------------------------------------------  Physical Exam: BP 140/72   Pulse 64   Ht '5\' 4"'  (1.626 m)   Wt 151 lb (68.5 kg)   SpO2 97%   BMI 25.92 kg/m   General:  Appears anxious and hurried. She becomes tearful several times during the visit. HEENT: No conjunctival pallor or scleral icterus. Moist mucous membranes.  OP clear. Neck: Supple without lymphadenopathy, thyromegaly, JVD, or HJR. Lungs: Normal work of breathing. Clear to auscultation bilaterally without wheezes or crackles. Heart: Regular rate and rhythm without murmurs, rubs, or gallops. Non-displaced PMI. Abd: Bowel sounds present. Soft, NT/ND without hepatosplenomegaly Ext: No lower extremity edema. 2+ radial pulses. Skin: Warm and dry without rash.  EKG:  NSR with LVH and abnormal repolarization.  Lab Results  Component Value Date   WBC 5.3 11/24/2017   HGB 10.6 (L) 11/24/2017   HCT 31.1 (L) 11/24/2017   MCV 81 11/24/2017   PLT 194 11/24/2017    Lab Results  Component Value Date   NA 142 11/24/2017   K 3.8 11/24/2017   CL 108 (H) 11/24/2017   CO2 20 11/24/2017   BUN 11 11/24/2017   CREATININE 1.28 (H) 11/24/2017   GLUCOSE 102 (H) 11/24/2017   ALT 10 (L) 06/25/2015    Lab Results  Component Value Date   CHOL 149 08/07/2017   HDL 57 08/07/2017   LDLCALC 74 08/07/2017   LDLDIRECT 88 07/16/2011   TRIG 88 08/07/2017   CHOLHDL 2.6 08/07/2017    --------------------------------------------------------------------------------------------------  ASSESSMENT AND PLAN: Coronary artery disease without angina No further chest pain since Ms. Macfadden presented to the ED/urgent care on 2/7 and 2/8.  However, stress test was abnormal with evidence of anterior ischemia and possible inferior scar vs. artifact. We have discussed further evaluation and treatment options, including medical therapy, coronary  CTA, and cardiac catheterization; we have agreed to proceed with catheterization.  I have reviewed the risks, indications, and alternatives to cardiac catheterization, possible angioplasty, and stenting with the patient. Risks include but are not limited to bleeding, infection, vascular injury, stroke, myocardial infection, arrhythmia, kidney injury, radiation-related injury in the case of prolonged fluoroscopy use, emergency cardiac surgery, and death. The patient understands the risks of serious complication is 1-2 in 7340 with diagnostic cardiac cath and 1-2% or less with angioplasty/stenting.  Due to CKD (creatinine back to baseline on check earlier this week), I will have Ms. Lombard come in 4 hours early for prehydration.  I will also recheck a CBC and BMP on the morning of the procedure.  She will need to hold apixaban 2 days before the catheterization and begin taking ASA 81 mg daily the day before the procedure.  Paroxysmal atrial fibrillation No evidence of recurrence.  EKG today shows NSR.  Continue apixaban given CHADSVASC score of 3-4 (gender, age, hypertension, +/- CAD). We will also continue current dose of metoprolol.  Hyperlipidemia Continue rosuvastatin 20 mg daily.  Essential hypertensin BP mildly elevated.  I encouraged sodium restriction.  Continue irbesartan, amlodipine, and metoprolol.  CKD stage 3 Repeat creatinine back to baseline following AKI at the time of chest pain and a-fib in February.  Continue current medications with plan for prehydration prior to cath, as outlined above.  Follow-up: TBD based on results of cardiac catheterization.  Nelva Bush, MD 11/27/2017 7:04 AM

## 2017-11-27 ENCOUNTER — Encounter: Payer: Self-pay | Admitting: Internal Medicine

## 2017-11-27 DIAGNOSIS — I2510 Atherosclerotic heart disease of native coronary artery without angina pectoris: Secondary | ICD-10-CM

## 2017-11-27 HISTORY — DX: Atherosclerotic heart disease of native coronary artery without angina pectoris: I25.10

## 2017-11-27 NOTE — Addendum Note (Signed)
Addended by: Jaceyon Strole A on: 11/27/2017 07:16 AM   Modules accepted: Orders

## 2017-11-30 ENCOUNTER — Telehealth: Payer: Self-pay | Admitting: *Deleted

## 2017-11-30 MED ORDER — ASPIRIN EC 81 MG PO TBEC: DELAYED_RELEASE_TABLET | ORAL | Status: DC

## 2017-11-30 NOTE — Telephone Encounter (Addendum)
Pt contacted pre-catheterization scheduled at [facility] for: Thursday December 03, 2017 12 Noon Verified arrival time and place: [place] at: 7 AM-for IV hydration pre-cath Nothing to eat or drink after midnight prior to cath. Verified allergies in Epic.  Verified no diabetes medications.  Hold: Apixaban 12/01/17, 12/02/17, 12/03/17 until post cath. Pt aware to start ASA 81 mg daily on 12/02/17.    Except hold medications AM meds can be  taken pre-cath with sip of water including: ASA 81 mg  Confirmed patient has responsible person to drive home post procedure and observe patient for 24 hours: yes

## 2017-12-03 ENCOUNTER — Ambulatory Visit (HOSPITAL_COMMUNITY)
Admission: RE | Admit: 2017-12-03 | Discharge: 2017-12-03 | Disposition: A | Discharge status: Home / Self Care | Payer: Medicare HMO | Source: Ambulatory Visit | Attending: Internal Medicine | Admitting: Internal Medicine

## 2017-12-03 ENCOUNTER — Ambulatory Visit (HOSPITAL_COMMUNITY)
Admission: RE | Disposition: A | Discharge status: Home / Self Care | Payer: Self-pay | Source: Ambulatory Visit | Attending: Internal Medicine

## 2017-12-03 DIAGNOSIS — Z888 Allergy status to other drugs, medicaments and biological substances status: Secondary | ICD-10-CM | POA: Insufficient documentation

## 2017-12-03 DIAGNOSIS — N183 Chronic kidney disease, stage 3 (moderate): Secondary | ICD-10-CM | POA: Diagnosis not present

## 2017-12-03 DIAGNOSIS — I480 Paroxysmal atrial fibrillation: Secondary | ICD-10-CM | POA: Insufficient documentation

## 2017-12-03 DIAGNOSIS — Z79899 Other long term (current) drug therapy: Secondary | ICD-10-CM | POA: Insufficient documentation

## 2017-12-03 DIAGNOSIS — I2584 Coronary atherosclerosis due to calcified coronary lesion: Secondary | ICD-10-CM | POA: Insufficient documentation

## 2017-12-03 DIAGNOSIS — I129 Hypertensive chronic kidney disease with stage 1 through stage 4 chronic kidney disease, or unspecified chronic kidney disease: Secondary | ICD-10-CM | POA: Insufficient documentation

## 2017-12-03 DIAGNOSIS — I25119 Atherosclerotic heart disease of native coronary artery with unspecified angina pectoris: Secondary | ICD-10-CM | POA: Insufficient documentation

## 2017-12-03 DIAGNOSIS — F17210 Nicotine dependence, cigarettes, uncomplicated: Secondary | ICD-10-CM | POA: Insufficient documentation

## 2017-12-03 DIAGNOSIS — I209 Angina pectoris, unspecified: Secondary | ICD-10-CM | POA: Diagnosis present

## 2017-12-03 DIAGNOSIS — I2510 Atherosclerotic heart disease of native coronary artery without angina pectoris: Secondary | ICD-10-CM

## 2017-12-03 DIAGNOSIS — E785 Hyperlipidemia, unspecified: Secondary | ICD-10-CM | POA: Insufficient documentation

## 2017-12-03 DIAGNOSIS — R9439 Abnormal result of other cardiovascular function study: Secondary | ICD-10-CM | POA: Diagnosis present

## 2017-12-03 HISTORY — PX: LEFT HEART CATH AND CORONARY ANGIOGRAPHY: CATH118249

## 2017-12-03 LAB — CBC
HCT: 30 % — ABNORMAL LOW (ref 36.0–46.0)
Hemoglobin: 10.3 g/dL — ABNORMAL LOW (ref 12.0–15.0)
MCH: 27.6 pg (ref 26.0–34.0)
MCHC: 34.3 g/dL (ref 30.0–36.0)
MCV: 80.4 fL (ref 78.0–100.0)
PLATELETS: 160 10*3/uL (ref 150–400)
RBC: 3.73 MIL/uL — ABNORMAL LOW (ref 3.87–5.11)
RDW: 15.2 % (ref 11.5–15.5)
WBC: 4.7 10*3/uL (ref 4.0–10.5)

## 2017-12-03 LAB — BASIC METABOLIC PANEL
Anion gap: 8 (ref 5–15)
BUN: 10 mg/dL (ref 6–20)
CO2: 21 mmol/L — ABNORMAL LOW (ref 22–32)
CREATININE: 1.3 mg/dL — AB (ref 0.44–1.00)
Calcium: 8.6 mg/dL — ABNORMAL LOW (ref 8.9–10.3)
Chloride: 112 mmol/L — ABNORMAL HIGH (ref 101–111)
GFR calc Af Amer: 47 mL/min — ABNORMAL LOW (ref >60)
GFR, EST NON AFRICAN AMERICAN: 41 mL/min — AB (ref >60)
GLUCOSE: 77 mg/dL (ref 65–99)
Potassium: 4.1 mmol/L (ref 3.5–5.1)
SODIUM: 141 mmol/L (ref 135–145)

## 2017-12-03 SURGERY — LEFT HEART CATH AND CORONARY ANGIOGRAPHY
Anesthesia: LOCAL

## 2017-12-03 MED ORDER — VERAPAMIL HCL 2.5 MG/ML IV SOLN
INTRAVENOUS | Status: DC | PRN
  Administered 2017-12-03: 10 mL via INTRA_ARTERIAL

## 2017-12-03 MED ORDER — MIDAZOLAM HCL 2 MG/2ML IJ SOLN
INTRAMUSCULAR | Status: AC
  Filled 2017-12-03: qty 2

## 2017-12-03 MED ORDER — SODIUM CHLORIDE 0.9% FLUSH: 3.0000 mL | INTRAVENOUS | Status: DC | PRN

## 2017-12-03 MED ORDER — HEPARIN SODIUM (PORCINE) 1000 UNIT/ML IJ SOLN
INTRAMUSCULAR | Status: DC | PRN
  Administered 2017-12-03: 3500 [IU] via INTRAVENOUS

## 2017-12-03 MED ORDER — ASPIRIN 81 MG PO CHEW: 81.0000 mg | CHEWABLE_TABLET | ORAL | Status: DC

## 2017-12-03 MED ORDER — VERAPAMIL HCL 2.5 MG/ML IV SOLN
INTRAVENOUS | Status: AC
  Filled 2017-12-03: qty 2

## 2017-12-03 MED ORDER — HEPARIN (PORCINE) IN NACL 2-0.9 UNIT/ML-% IJ SOLN
INTRAMUSCULAR | Status: AC | PRN
  Administered 2017-12-03 (×2): 500 mL via INTRA_ARTERIAL

## 2017-12-03 MED ORDER — HEPARIN (PORCINE) IN NACL 2-0.9 UNIT/ML-% IJ SOLN
INTRAMUSCULAR | Status: AC
  Filled 2017-12-03: qty 500

## 2017-12-03 MED ORDER — FENTANYL CITRATE (PF) 100 MCG/2ML IJ SOLN
INTRAMUSCULAR | Status: AC
Start: 2017-12-03 — End: 2017-12-03
  Filled 2017-12-03: qty 2

## 2017-12-03 MED ORDER — SODIUM CHLORIDE 0.9 % IV SOLN: 250.0000 mL | INTRAVENOUS | Status: DC | PRN

## 2017-12-03 MED ORDER — SODIUM CHLORIDE 0.9 % WEIGHT BASED INFUSION: 1.0000 mL/kg/h | INTRAVENOUS | Status: DC

## 2017-12-03 MED ORDER — MIDAZOLAM HCL 2 MG/2ML IJ SOLN
INTRAMUSCULAR | Status: DC | PRN
  Administered 2017-12-03: 1 mg via INTRAVENOUS

## 2017-12-03 MED ORDER — IRBESARTAN 150 MG PO TABS: 150.0000 mg | ORAL_TABLET | Freq: Every day | ORAL | 5 refills | Status: DC

## 2017-12-03 MED ORDER — LIDOCAINE HCL (PF) 1 % IJ SOLN
INTRAMUSCULAR | Status: DC | PRN
  Administered 2017-12-03: 2 mL via INTRADERMAL

## 2017-12-03 MED ORDER — IOHEXOL 350 MG/ML SOLN
INTRAVENOUS | Status: DC | PRN
  Administered 2017-12-03: 50 mL via INTRA_ARTERIAL

## 2017-12-03 MED ORDER — ONDANSETRON HCL 4 MG/2ML IJ SOLN: 4.0000 mg | Freq: Four times a day (QID) | INTRAMUSCULAR | Status: DC | PRN

## 2017-12-03 MED ORDER — SODIUM CHLORIDE 0.9 % IV SOLN: INTRAVENOUS | Status: DC

## 2017-12-03 MED ORDER — FENTANYL CITRATE (PF) 100 MCG/2ML IJ SOLN
INTRAMUSCULAR | Status: DC | PRN
  Administered 2017-12-03: 50 ug via INTRAVENOUS

## 2017-12-03 MED ORDER — SODIUM CHLORIDE 0.9 % WEIGHT BASED INFUSION
3.0000 mL/kg/h | INTRAVENOUS | Status: DC
  Administered 2017-12-03: 3 mL/kg/h via INTRAVENOUS

## 2017-12-03 MED ORDER — ACETAMINOPHEN 325 MG PO TABS: 650.0000 mg | ORAL_TABLET | ORAL | Status: DC | PRN

## 2017-12-03 MED ORDER — SODIUM CHLORIDE 0.9% FLUSH: 3.0000 mL | Freq: Two times a day (BID) | INTRAVENOUS | Status: DC

## 2017-12-03 MED ORDER — HEPARIN SODIUM (PORCINE) 1000 UNIT/ML IJ SOLN
INTRAMUSCULAR | Status: AC
  Filled 2017-12-03: qty 1

## 2017-12-03 MED ORDER — LIDOCAINE HCL 1 % IJ SOLN
INTRAMUSCULAR | Status: AC
  Filled 2017-12-03: qty 20

## 2017-12-03 SURGICAL SUPPLY — 12 items
BAND CMPR LRG ZPHR (HEMOSTASIS) ×1
BAND ZEPHYR COMPRESS 30 LONG (HEMOSTASIS) ×1 IMPLANT
CATH IMPULSE 5F ANG/FL3.5 (CATHETERS) ×1 IMPLANT
GUIDEWIRE INQWIRE 1.5J.035X260 (WIRE) IMPLANT
INQWIRE 1.5J .035X260CM (WIRE) ×2
KIT HEART LEFT (KITS) ×2 IMPLANT
NDL PERC ENTRY 21G 2.5CM (NEEDLE) IMPLANT
NEEDLE PERC ENTRY 21G 2.5CM (NEEDLE) ×2 IMPLANT
PACK CARDIAC CATHETERIZATION (CUSTOM PROCEDURE TRAY) ×2 IMPLANT
SHEATH RAIN RADIAL 21G 6FR (SHEATH) ×1 IMPLANT
TRANSDUCER W/STOPCOCK (MISCELLANEOUS) ×2 IMPLANT
TUBING CIL FLEX 10 FLL-RA (TUBING) ×2 IMPLANT

## 2017-12-03 NOTE — Brief Op Note (Signed)
BRIEF CARDIAC CATHETERIZATION NOTE  DATE: 12/03/2017 TIME: 1:28 PM  PATIENT:  Jennifer Foster  72 y.o. female  PRE-OPERATIVE DIAGNOSIS:  Angina pectoris and abnormal stress test  POST-OPERATIVE DIAGNOSIS:  Same  PROCEDURE:  Procedure(s): LEFT HEART CATH AND CORONARY ANGIOGRAPHY (N/A)  SURGEON:  Surgeon(s) and Role:    Nelva Bush, MD - Primary   FINDINGS: 1. Single vessel CAD with tortuous and diffusely disease RCA with up to 70% stenosis. 2. Mild to moderate, non-obstructive CAD involving LCA. 3. Mildly elevated LVEDP. 4. Systemic hypertension.  RECOMMENDATIONS: 1. Medical therapy, including improved BP control and statin therapy. 2. BMP early next week to ensure stable renal function. 3. F/u in the office in 3-4 weeks to reassess symptoms. 4. Restart apixaban in 2 days if no evidence of bleeding at right radial arteriotomy site.  Nelva Bush, MD [facility] Pager: [phone]

## 2017-12-03 NOTE — Interval H&P Note (Signed)
History and Physical Interval Note:  12/03/2017 12:00 PM  Jennifer Foster  has presented today for cardiac catheterization, with the diagnosis of angina pectoris and abnormal stress test. The various methods of treatment have been discussed with the patient and family. After consideration of risks, benefits and other options for treatment, the patient has consented to  Procedure(s): LEFT HEART CATH AND CORONARY ANGIOGRAPHY (N/A) as a surgical intervention .  The patient's history has been reviewed, patient examined, no change in status, stable for surgery.  I have reviewed the patient's chart and labs.  Questions were answered to the patient's satisfaction.    Cath Lab Visit (complete for each Cath Lab visit)  Clinical Evaluation Leading to the Procedure:   ACS: No.  Non-ACS:    Anginal Classification: CCS III  Anti-ischemic medical therapy: Minimal Therapy (1 class of medications)  Non-Invasive Test Results: Intermediate-risk stress test findings: cardiac mortality 1-3%/year  Prior CABG: No previous CABG  Neale Marzette

## 2017-12-03 NOTE — Discharge Instructions (Signed)

## 2017-12-04 ENCOUNTER — Encounter (HOSPITAL_COMMUNITY): Payer: Self-pay | Admitting: Internal Medicine

## 2017-12-04 ENCOUNTER — Other Ambulatory Visit: Payer: Self-pay

## 2017-12-04 DIAGNOSIS — I25119 Atherosclerotic heart disease of native coronary artery with unspecified angina pectoris: Secondary | ICD-10-CM

## 2017-12-04 MED FILL — Lidocaine HCl Local Inj 1%: INTRAMUSCULAR | Qty: 20 | Status: AC

## 2017-12-04 MED FILL — Heparin Sodium (Porcine) 2 Unit/ML in Sodium Chloride 0.9%: INTRAMUSCULAR | Qty: 1000 | Status: AC

## 2017-12-08 ENCOUNTER — Other Ambulatory Visit: Payer: Medicare HMO

## 2017-12-08 DIAGNOSIS — I25119 Atherosclerotic heart disease of native coronary artery with unspecified angina pectoris: Secondary | ICD-10-CM

## 2017-12-08 LAB — BASIC METABOLIC PANEL
BUN/Creatinine Ratio: 10 — ABNORMAL LOW (ref 12–28)
BUN: 13 mg/dL (ref 8–27)
CO2: 23 mmol/L (ref 20–29)
Calcium: 8.6 mg/dL — ABNORMAL LOW (ref 8.7–10.3)
Chloride: 107 mmol/L — ABNORMAL HIGH (ref 96–106)
Creatinine, Ser: 1.25 mg/dL — ABNORMAL HIGH (ref 0.57–1.00)
GFR, EST AFRICAN AMERICAN: 51 mL/min/{1.73_m2} — AB (ref >59)
GFR, EST NON AFRICAN AMERICAN: 44 mL/min/{1.73_m2} — AB (ref >59)
Glucose: 92 mg/dL (ref 65–99)
POTASSIUM: 3.9 mmol/L (ref 3.5–5.2)
SODIUM: 144 mmol/L (ref 134–144)

## 2018-01-01 ENCOUNTER — Ambulatory Visit (INDEPENDENT_AMBULATORY_CARE_PROVIDER_SITE_OTHER): Payer: Medicare HMO | Admitting: Physician Assistant

## 2018-01-01 ENCOUNTER — Encounter: Payer: Self-pay | Admitting: Physician Assistant

## 2018-01-01 VITALS — BP 158/88 | HR 59 | Ht 64.0 in | Wt 159.4 lb

## 2018-01-01 DIAGNOSIS — I700 Atherosclerosis of aorta: Secondary | ICD-10-CM

## 2018-01-01 DIAGNOSIS — N183 Chronic kidney disease, stage 3 unspecified: Secondary | ICD-10-CM

## 2018-01-01 DIAGNOSIS — I712 Thoracic aortic aneurysm, without rupture, unspecified: Secondary | ICD-10-CM

## 2018-01-01 DIAGNOSIS — I480 Paroxysmal atrial fibrillation: Secondary | ICD-10-CM

## 2018-01-01 DIAGNOSIS — I10 Essential (primary) hypertension: Secondary | ICD-10-CM

## 2018-01-01 DIAGNOSIS — I2510 Atherosclerotic heart disease of native coronary artery without angina pectoris: Secondary | ICD-10-CM

## 2018-01-01 HISTORY — DX: Atherosclerosis of aorta: I70.0

## 2018-01-01 MED ORDER — IRBESARTAN 300 MG PO TABS: 300.0000 mg | ORAL_TABLET | Freq: Every day | ORAL | 3 refills | Status: DC

## 2018-01-01 NOTE — Patient Instructions (Signed)
Medication Instructions:  Change Amlodipine to 10 mg Once daily   - you have a bottle of Amlodipine 10 mg and Amlodipine 5 mg  - you can take 2 tablets of the Amlodipine 5 mg to equal 10 mg until they run out  - then you can take the Amlodipine 10 mg tablets Once daily   - DO NOT take Amlodipine 5 mg and 10 mg together.  Increase Irbesartan to 300 mg Once daily   - you can take 2 tablets of the Irbesartan 150 mg to equal 300 mg (until the bottle is empty)  - a new prescription will be sent in for 300 mg tablets - you can start these once the 150 mg tablets are gone  Labwork: In 2 weeks - BMET  Testing/Procedures: In September 2019 - Chest CTA (Dx: Ascending Thoracic Aortic Aneurysm)   Follow-Up: Nelva Bush, MD in June as scheduled.  Any Other Special Instructions Will Be Listed Below (If Applicable).  If you need a refill on your cardiac medications before your next appointment, please call your pharmacy.

## 2018-01-01 NOTE — Progress Notes (Signed)
Cardiology Office Note:    Date:  01/01/2018   ID:  Jennifer Foster, DOB 08-26-1946, MRN [id number]  PCP:  [name], MD  Cardiologist:  Nelva Bush, MD   Referring MD: [name], MD   Chief Complaint  Patient presents with  . Follow-up    CAD, Post cath;  HTN    History of Present Illness:    Jennifer Foster is a 72 y.o. female with paroxysmal atrial fibrillation, hypertension, chronic anemia and remote GI bleed, CKD stage 3, and prior substance abuse (stopped using cocaine ~1 year ago).  Stress testing in March 2019 demonstrated partially reversible anterior defect concerning for ischemia.  Last seen by Dr. Saunders Revel 11/26/17.  Cardiac catheterization was recommended.  This demonstrated single-vessel CAD with mid RCA 60% and focal 60-70% distal RCA.  There was mild to moderate disease involving a large tortuous LAD and LCx.  Given the noncritical nature of the single-vessel disease and no chest pain since atrial fibrillation resolved, medical therapy was recommended.  Ms. Jennifer Foster returns for follow-up on coronary artery disease and atrial fibrillation.  She is here alone.  Since her procedure, she has done well.  She denies chest pain, significant shortness of breath, PND, edema, syncope.  Prior CV studies:   The following studies were reviewed today:  Cardiac catheterization 12/03/2017 LAD mild disease LCx proximal 35; OM2 30 RCA mid 60, distal 65 LVEDP 15-20  Nuclear stress test 11/12/2017 Medium size, moderate severity partially reversible anterior perfusion defect suggestive of ischemia. There is also a smaller, basal to mid inferior perfusion defect which is fixed, suggestive of artifact or scar.  LVEF 50% with inferior hypokinesis. This is an intermediate risk study. Clinical correlation is recommended.  Echo 10/22/2017 Moderate LVH, EF 80-16, grade 2 diastolic dysfunction, trivial MR, moderate to severe LAE, PASP 30  Chest CT w/ contrast 04/03/17 IMPRESSION: 1. No  suspicious pulmonary nodule or mass. 2. 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 Recommend annual imaging followup by CTA or MRA. This recommendation follows 2010 ACCF/AHA/AATS/ACR/ASA/SCA/SCAI/SIR/STS/SVM Guidelines for the Diagnosis and Management of Patients with Thoracic Aortic Disease. Circulation. 2010; 121: P537-S827 3. Coronary artery calcifications 4. Mild centrilobular pulmonary emphysema. 5. Multinodular thyroid goiter. 6. Left adrenal thickening likely representing hyperplasia and 1.9 cm right adrenal adenoma. Aortic Atherosclerosis (ICD10-170.0); Aortic Atherosclerosis (ICD10-I70.0) and Emphysema (ICD10-J43.9).  Holter 7/18 NSR with PAC's Brief short SVT Overall, unremarkable  Nuclear stress test 11/23/2014 Normal stress nuclear study. overall low risk with no evidence of significant ischemia identified. Left ventricular hypertrophy suggested by increased wall thickness seen on perfusion images.  LV Ejection Fraction: 56%.  Echo 03/16/2013 Severe LVH, EF 55-60, mild MR, mild to moderate LAE, PASP 33  Past Medical History:  Diagnosis Date  . Acute blood loss anemia 06/26/2015  . Acute blood loss anemia 06/26/2015  . Aortic atherosclerosis ([place]) 01/01/2018   CT 04/2017  . Asthma   . Chronic kidney disease (CKD), stage III (moderate) ([place]) 06/27/2015  . DIVERTICULAR BLEEDING, HX OF 10/14/2007   Colonoscopy 2008 showed diverticulosis.   [name] GERD (gastroesophageal reflux disease)   . Gout   . History of anemia   . History of asthma   . Hyperlipemia   . Hypertension   . Joint pain   . Lipoma   . SOB (shortness of breath) 03/18/2016   Surgical Hx: The patient  has a past surgical history that includes Abdominal hysterectomy (1981); Lipoma excision (01/28/11);  Colonoscopy (N/A, 12/08/2013); Esophagogastroduodenoscopy (N/A, 12/08/2013); Givens capsule study (N/A, 12/08/2013); Colonoscopy (N/A, 06/28/2015); and LEFT HEART CATH  AND CORONARY ANGIOGRAPHY (N/A, 12/03/2017).   Current Medications: Current Meds  Medication Sig  . amLODipine (NORVASC) 5 MG tablet Take 7.5 mg by mouth daily.  [name] apixaban (ELIQUIS) 5 MG TABS tablet Take 1 tablet (5 mg total) by mouth 2 (two) times daily. ON HOLD 12/01/17  . baclofen 5 MG TABS Take 5 mg by mouth 2 (two) times daily as needed for muscle spasms.  . metoprolol succinate (TOPROL XL) 25 MG 24 hr tablet Take half tablet by mouth twice a day (Patient taking differently: Take 25 mg by mouth daily. )  . nicotine (NICODERM CQ - DOSED IN MG/24 HR) 7 mg/24hr patch PLACE 1 PATCH (7 MG TOTAL) ONTO THE SKIN DAILY  . nitroGLYCERIN (NITROSTAT) 0.4 MG SL tablet Place 1 tablet (0.4 mg total) under the tongue every 5 (five) minutes as needed for chest pain. If do not resolve after 1 tablet, go to ED  . rosuvastatin (CRESTOR) 20 MG tablet Take 1 tablet (20 mg total) by mouth at bedtime.  . [DISCONTINUED] HYDROcodone-acetaminophen (NORCO/VICODIN) 5-325 MG tablet Take 1-2 tablets by mouth every 6 (six) hours as needed.  . [DISCONTINUED] irbesartan (AVAPRO) 150 MG tablet Take 1 tablet (150 mg total) by mouth daily.     Allergies:   Ace inhibitors; Lisinopril; and Tramadol   Social History   Tobacco Use  . Smoking status: Current Some Day Smoker    Packs/day: 0.25    Types: Cigarettes  . Smokeless tobacco: Never Used  Substance Use Topics  . Alcohol use: Yes    Alcohol/week: 0.0 oz    Comment: occasionally  . Drug use: No     Family Hx: The patient's family history includes Diabetes type II in her unknown relative; Kidney failure in her son and unknown relative.  ROS:   Please see the history of present illness.    ROS All other systems reviewed and are negative.   EKGs/Labs/Other Test Reviewed:    EKG:  EKG is  ordered today.  The ekg ordered today demonstrates sinus bradycardia, normal axis, LVH with repolarization abnormality, QTC 425 ms  Recent Labs: 04/14/2017: TSH  0.884 08/07/2017: Magnesium 2.0 12/03/2017: Hemoglobin 10.3; Platelets 160 12/08/2017: BUN 13; Creatinine, Ser 1.25; Potassium 3.9; Sodium 144   Recent Lipid Panel Lab Results  Component Value Date/Time   CHOL 149 08/07/2017 10:36 AM   TRIG 88 08/07/2017 10:36 AM   HDL 57 08/07/2017 10:36 AM   CHOLHDL 2.6 08/07/2017 10:36 AM   CHOLHDL 2.3 03/18/2016 11:20 AM   LDLCALC 74 08/07/2017 10:36 AM   LDLDIRECT 88 07/16/2011 11:38 AM    Physical Exam:    VS:  BP (!) 158/88   Pulse (!) 59   Ht 5\' 4"  (1.626 m)   Wt 159 lb 6.4 oz (72.3 kg)   BMI 27.36 kg/m     Wt Readings from Last 3 Encounters:  01/01/18 159 lb 6.4 oz (72.3 kg)  12/03/17 151 lb (68.5 kg)  11/26/17 151 lb (68.5 kg)     Physical Exam  Constitutional: She is oriented to person, place, and time. She appears well-developed and well-nourished. No distress.  HENT:  Head: Normocephalic and atraumatic.  Neck: Neck supple. No JVD present.  Cardiovascular: Normal rate, regular rhythm, S1 normal, S2 normal and normal heart sounds.  No murmur heard. Pulmonary/Chest: Effort normal. She has no rales.  Abdominal: Soft. There is  no hepatomegaly.  Musculoskeletal: She exhibits no edema.  R wrist without hematoma; R FA without hematoma or bruit  Neurological: She is alert and oriented to person, place, and time.  Skin: Skin is warm and dry.    ASSESSMENT & PLAN:    Essential hypertension Her blood pressure remains above target.  I reviewed her medicines with her today.  There has been some confusion about her dosages.  She was supposed to be taking amlodipine 7.5 mg daily.  However, she has a bottle for 10 mg pills as well as 5 mg pills.  She has been taking a 10 mg pill along with a half tablet of 5 mg.  -Change amlodipine to 10 mg daily  -Increase the irbesartan to 300 mg daily  -Repeat BMET 2 weeks  -Keep follow-up with Dr. Saunders Revel in June as planned  Coronary artery disease involving native coronary artery of native heart  without angina pectoris Recent cardiac catheterization with moderate disease in the right coronary artery.  She denies anginal symptoms.  She is not on aspirin as she is on Apixaban.  Continue amlodipine, metoprolol, rosuvastatin.  Paroxysmal atrial fibrillation (HCC) Maintaining normal sinus rhythm.  Continue anticoagulation with Apixaban.  Thoracic aortic aneurysm without rupture (HCC) 4.1 cm by chest CT in August 2018.  Arrange follow-up chest CT in September 2019.  Chronic kidney disease, stage 3 (HCC)  Recent creatinine stable.  Repeat BMET in 2 weeks after increasing ARB dose.  Aortic atherosclerosis (HCC) Noted on CT in the past.  Continue statin.   Dispo:  Return in about 6 weeks (around 02/15/2018) for Scheduled Follow Up, w/ Dr. Saunders Revel.   Medication Adjustments/Labs and Tests Ordered: Current medicines are reviewed at length with the patient today.  Concerns regarding medicines are outlined above.  Tests Ordered: Orders Placed This Encounter  Procedures  . CT ANGIO CHEST AORTA W &/OR WO CONTRAST  . Basic Metabolic Panel (BMET)  . EKG 12-Lead   Medication Changes: Meds ordered this encounter  Medications  . irbesartan (AVAPRO) 300 MG tablet    Sig: Take 1 tablet (300 mg total) by mouth daily.    Dispense:  90 tablet    Refill:  3    Signed, Richardson Dopp, PA-C  01/01/2018 [facility] [place], [place], [place]  [address] Phone: [phone]; Fax: [phone]

## 2018-01-25 ENCOUNTER — Other Ambulatory Visit: Payer: Self-pay | Admitting: Family Medicine

## 2018-01-26 ENCOUNTER — Other Ambulatory Visit: Payer: Self-pay

## 2018-01-26 ENCOUNTER — Encounter: Payer: Self-pay | Admitting: Family Medicine

## 2018-01-26 ENCOUNTER — Ambulatory Visit (INDEPENDENT_AMBULATORY_CARE_PROVIDER_SITE_OTHER): Payer: Medicare HMO | Admitting: Family Medicine

## 2018-01-26 VITALS — BP 136/70 | HR 68 | Temp 98.1°F | Ht 64.0 in | Wt 162.0 lb

## 2018-01-26 DIAGNOSIS — Z0000 Encounter for general adult medical examination without abnormal findings: Secondary | ICD-10-CM | POA: Diagnosis not present

## 2018-01-26 DIAGNOSIS — N183 Chronic kidney disease, stage 3 unspecified: Secondary | ICD-10-CM

## 2018-01-26 NOTE — Patient Instructions (Signed)
Managing Your Hypertension Hypertension is commonly called high blood pressure. This is when the force of your blood pressing against the walls of your arteries is too strong. Arteries are blood vessels that carry blood from your heart throughout your body. Hypertension forces the heart to work harder to pump blood, and may cause the arteries to become narrow or stiff. Having untreated or uncontrolled hypertension can cause heart attack, stroke, kidney disease, and other problems. What are blood pressure readings? A blood pressure reading consists of a higher number over a lower number. Ideally, your blood pressure should be below 120/80. The first ("top") number is called the systolic pressure. It is a measure of the pressure in your arteries as your heart beats. The second ("bottom") number is called the diastolic pressure. It is a measure of the pressure in your arteries as the heart relaxes. What does my blood pressure reading mean? Blood pressure is classified into four stages. Based on your blood pressure reading, your health care provider may use the following stages to determine what type of treatment you need, if any. Systolic pressure and diastolic pressure are measured in a unit called mm Hg. Normal  Systolic pressure: below 120.  Diastolic pressure: below 80. Elevated  Systolic pressure: 120-129.  Diastolic pressure: below 80. Hypertension stage 1  Systolic pressure: 130-139.  Diastolic pressure: 80-89. Hypertension stage 2  Systolic pressure: 140 or above.  Diastolic pressure: 90 or above. What health risks are associated with hypertension? Managing your hypertension is an important responsibility. Uncontrolled hypertension can lead to:  A heart attack.  A stroke.  A weakened blood vessel (aneurysm).  Heart failure.  Kidney damage.  Eye damage.  Metabolic syndrome.  Memory and concentration problems.  What changes can I make to manage my  hypertension? Hypertension can be managed by making lifestyle changes and possibly by taking medicines. Your health care provider will help you make a plan to bring your blood pressure within a normal range. Eating and drinking  Eat a diet that is high in fiber and potassium, and low in salt (sodium), added sugar, and fat. An example eating plan is called the DASH (Dietary Approaches to Stop Hypertension) diet. To eat this way: ? Eat plenty of fresh fruits and vegetables. Try to fill half of your plate at each meal with fruits and vegetables. ? Eat whole grains, such as whole wheat pasta, brown rice, or whole grain bread. Fill about one quarter of your plate with whole grains. ? Eat low-fat diary products. ? Avoid fatty cuts of meat, processed or cured meats, and poultry with skin. Fill about one quarter of your plate with lean proteins such as fish, chicken without skin, beans, eggs, and tofu. ? Avoid premade and processed foods. These tend to be higher in sodium, added sugar, and fat.  Reduce your daily sodium intake. Most people with hypertension should eat less than 1,500 mg of sodium a day.  Limit alcohol intake to no more than 1 drink a day for nonpregnant women and 2 drinks a day for men. One drink equals 12 oz of beer, 5 oz of wine, or 1 oz of hard liquor. Lifestyle  Work with your health care provider to maintain a healthy body weight, or to lose weight. Ask what an ideal weight is for you.  Get at least 30 minutes of exercise that causes your heart to beat faster (aerobic exercise) most days of the week. Activities may include walking, swimming, or biking.  Include exercise   to strengthen your muscles (resistance exercise), such as weight lifting, as part of your weekly exercise routine. Try to do these types of exercises for 30 minutes at least 3 days a week.  Do not use any products that contain nicotine or tobacco, such as cigarettes and e-cigarettes. If you need help quitting, ask  your health care provider.  Control any long-term (chronic) conditions you have, such as high cholesterol or diabetes. Monitoring  Monitor your blood pressure at home as told by your health care provider. Your personal target blood pressure may vary depending on your medical conditions, your age, and other factors.  Have your blood pressure checked regularly, as often as told by your health care provider. Working with your health care provider  Review all the medicines you take with your health care provider because there may be side effects or interactions.  Talk with your health care provider about your diet, exercise habits, and other lifestyle factors that may be contributing to hypertension.  Visit your health care provider regularly. Your health care provider can help you create and adjust your plan for managing hypertension. Will I need medicine to control my blood pressure? Your health care provider may prescribe medicine if lifestyle changes are not enough to get your blood pressure under control, and if:  Your systolic blood pressure is 130 or higher.  Your diastolic blood pressure is 80 or higher.  Take medicines only as told by your health care provider. Follow the directions carefully. Blood pressure medicines must be taken as prescribed. The medicine does not work as well when you skip doses. Skipping doses also puts you at risk for problems. Contact a health care provider if:  You think you are having a reaction to medicines you have taken.  You have repeated (recurrent) headaches.  You feel dizzy.  You have swelling in your ankles.  You have trouble with your vision. Get help right away if:  You develop a severe headache or confusion.  You have unusual weakness or numbness, or you feel faint.  You have severe pain in your chest or abdomen.  You vomit repeatedly.  You have trouble breathing. Summary  Hypertension is when the force of blood pumping through  your arteries is too strong. If this condition is not controlled, it may put you at risk for serious complications.  Your personal target blood pressure may vary depending on your medical conditions, your age, and other factors. For most people, a normal blood pressure is less than 120/80.  Hypertension is managed by lifestyle changes, medicines, or both. Lifestyle changes include weight loss, eating a healthy, low-sodium diet, exercising more, and limiting alcohol. This information is not intended to replace advice given to you by your health care provider. Make sure you discuss any questions you have with your health care provider. Document Released: 05/12/2012 Document Revised: 07/16/2016 Document Reviewed: 07/16/2016 Elsevier Interactive Patient Education  2018 [employer]  

## 2018-01-26 NOTE — Progress Notes (Addendum)
Subjective:     Jennifer Foster is a 72 y.o. female and is here for a comprehensive physical exam. The patient reports problems - gets cranky at times but otherwise fine.  Social History   Socioeconomic History  . Marital status: Married    Spouse name: Not on file  . Number of children: Not on file  . Years of education: Not on file  . Highest education level: Not on file  Occupational History  . Occupation: unemployed  Social Needs  . Financial resource strain: Not on file  . Food insecurity:    Worry: Not on file    Inability: Not on file  . Transportation needs:    Medical: Not on file    Non-medical: Not on file  Tobacco Use  . Smoking status: Current Some Day Smoker    Packs/day: 0.25    Types: Cigarettes  . Smokeless tobacco: Never Used  Substance and Sexual Activity  . Alcohol use: Yes    Alcohol/week: 0.0 oz    Comment: occasionally  . Drug use: No  . Sexual activity: Yes  Lifestyle  . Physical activity:    Days per week: Not on file    Minutes per session: Not on file  . Stress: Not on file  Relationships  . Social connections:    Talks on phone: Not on file    Gets together: Not on file    Attends religious service: Not on file    Active member of club or organization: Not on file    Attends meetings of clubs or organizations: Not on file    Relationship status: Not on file  . Intimate partner violence:    Fear of current or ex partner: Not on file    Emotionally abused: Not on file    Physically abused: Not on file    Forced sexual activity: Not on file  Other Topics Concern  . Not on file  Social History Narrative   Lives with husband Adri Schloss who is also an [facility] patient.  History of Marijuana and crack use.  Had cocaine + in May 2012   Health Maintenance  Topic Date Due  . INFLUENZA VACCINE  04/01/2018  . MAMMOGRAM  09/29/2018  . COLONOSCOPY  06/27/2025  . TETANUS/TDAP  06/04/2026  . DEXA SCAN  Completed  . Hepatitis C Screening  Completed   . PNA vac Low Risk Adult  Completed    The following portions of the patient's history were reviewed and updated as appropriate: allergies, current medications, past family history, past medical history, past social history, past surgical history and problem list.  Review of Systems Pertinent items are noted in HPI.   Objective:    BP 136/70   Pulse 68   Temp 98.1 F (36.7 C) (Oral)   Ht 5\' 4"  (1.626 m)   Wt 162 lb (73.5 kg)   SpO2 97%   BMI 27.81 kg/m  General appearance: alert and cooperative Head: Normocephalic, without obvious abnormality, atraumatic Eyes: conjunctivae/corneas clear. PERRL, EOM's intact. Fundi benign. Ears: normal TM's and external ear canals both ears Throat: lips, mucosa, and tongue normal; teeth and gums normal Lungs: clear to auscultation bilaterally Heart: regular rate and rhythm, S1, S2 normal, no murmur, click, rub or gallop Abdomen: soft, non-tender; bowel sounds normal; no masses,  no organomegaly Extremities: extremities normal, atraumatic, no cyanosis or edema Neurologic: Alert and oriented X 3, normal strength and tone. Normal symmetric reflexes. Normal coordination and gait  Assessment:    Healthy female exam.     CKD Plan:    Normal exam. Compliant with medications. Up to date with immunization and health maintenance. Bmet checked to assess CKD since her Arbs was recently increased. F/U in 4 months for HTN/CAD See After Visit Summary for Counseling Recommendations Patient ID: Jennifer Foster, female   DOB: [birth year], 72 y.o.   MRN: [id number]

## 2018-01-27 ENCOUNTER — Telehealth: Payer: Self-pay | Admitting: Family Medicine

## 2018-01-27 LAB — BASIC METABOLIC PANEL
BUN / CREAT RATIO: 9 — AB (ref 12–28)
BUN: 14 mg/dL (ref 8–27)
CHLORIDE: 108 mmol/L — AB (ref 96–106)
CO2: 21 mmol/L (ref 20–29)
Calcium: 8.5 mg/dL — ABNORMAL LOW (ref 8.7–10.3)
Creatinine, Ser: 1.49 mg/dL — ABNORMAL HIGH (ref 0.57–1.00)
GFR calc non Af Amer: 36 mL/min/{1.73_m2} — ABNORMAL LOW (ref >59)
GFR, EST AFRICAN AMERICAN: 41 mL/min/{1.73_m2} — AB (ref >59)
Glucose: 90 mg/dL (ref 65–99)
Potassium: 4.1 mmol/L (ref 3.5–5.2)
SODIUM: 143 mmol/L (ref 134–144)

## 2018-01-27 NOTE — Telephone Encounter (Signed)
Bmet result discussed with her. Creatinine bumped up a little. Her next nephrology appointment is June 16th. She is advised to discuss result and change in ARBs dose with them.  F/U as recommended.

## 2018-02-01 ENCOUNTER — Encounter: Payer: Self-pay | Admitting: *Deleted

## 2018-02-15 ENCOUNTER — Ambulatory Visit: Payer: Medicare HMO | Admitting: Internal Medicine

## 2018-02-21 ENCOUNTER — Observation Stay (HOSPITAL_COMMUNITY)
Admission: EM | Admit: 2018-02-21 | Discharge: 2018-02-24 | Disposition: A | Discharge status: Home / Self Care | Payer: Medicare HMO | Attending: Internal Medicine | Admitting: Internal Medicine

## 2018-02-21 ENCOUNTER — Emergency Department (HOSPITAL_COMMUNITY): Payer: Medicare HMO

## 2018-02-21 ENCOUNTER — Encounter (HOSPITAL_COMMUNITY): Payer: Self-pay | Admitting: Emergency Medicine

## 2018-02-21 DIAGNOSIS — K219 Gastro-esophageal reflux disease without esophagitis: Secondary | ICD-10-CM | POA: Diagnosis not present

## 2018-02-21 DIAGNOSIS — Z885 Allergy status to narcotic agent status: Secondary | ICD-10-CM | POA: Diagnosis not present

## 2018-02-21 DIAGNOSIS — F329 Major depressive disorder, single episode, unspecified: Secondary | ICD-10-CM | POA: Diagnosis not present

## 2018-02-21 DIAGNOSIS — J9601 Acute respiratory failure with hypoxia: Secondary | ICD-10-CM | POA: Diagnosis present

## 2018-02-21 DIAGNOSIS — N183 Chronic kidney disease, stage 3 (moderate): Secondary | ICD-10-CM | POA: Diagnosis not present

## 2018-02-21 DIAGNOSIS — F419 Anxiety disorder, unspecified: Secondary | ICD-10-CM | POA: Diagnosis not present

## 2018-02-21 DIAGNOSIS — Z79899 Other long term (current) drug therapy: Secondary | ICD-10-CM | POA: Insufficient documentation

## 2018-02-21 DIAGNOSIS — R0602 Shortness of breath: Secondary | ICD-10-CM

## 2018-02-21 DIAGNOSIS — E785 Hyperlipidemia, unspecified: Secondary | ICD-10-CM | POA: Insufficient documentation

## 2018-02-21 DIAGNOSIS — N184 Chronic kidney disease, stage 4 (severe): Secondary | ICD-10-CM | POA: Diagnosis present

## 2018-02-21 DIAGNOSIS — I712 Thoracic aortic aneurysm, without rupture, unspecified: Secondary | ICD-10-CM | POA: Diagnosis present

## 2018-02-21 DIAGNOSIS — I129 Hypertensive chronic kidney disease with stage 1 through stage 4 chronic kidney disease, or unspecified chronic kidney disease: Secondary | ICD-10-CM | POA: Diagnosis not present

## 2018-02-21 DIAGNOSIS — I4891 Unspecified atrial fibrillation: Secondary | ICD-10-CM | POA: Insufficient documentation

## 2018-02-21 DIAGNOSIS — F17210 Nicotine dependence, cigarettes, uncomplicated: Secondary | ICD-10-CM | POA: Insufficient documentation

## 2018-02-21 DIAGNOSIS — F411 Generalized anxiety disorder: Secondary | ICD-10-CM | POA: Insufficient documentation

## 2018-02-21 DIAGNOSIS — Z7901 Long term (current) use of anticoagulants: Secondary | ICD-10-CM | POA: Insufficient documentation

## 2018-02-21 DIAGNOSIS — F1410 Cocaine abuse, uncomplicated: Secondary | ICD-10-CM

## 2018-02-21 DIAGNOSIS — J189 Pneumonia, unspecified organism: Secondary | ICD-10-CM | POA: Diagnosis not present

## 2018-02-21 DIAGNOSIS — Z888 Allergy status to other drugs, medicaments and biological substances status: Secondary | ICD-10-CM | POA: Diagnosis not present

## 2018-02-21 DIAGNOSIS — J45909 Unspecified asthma, uncomplicated: Secondary | ICD-10-CM | POA: Insufficient documentation

## 2018-02-21 DIAGNOSIS — I700 Atherosclerosis of aorta: Secondary | ICD-10-CM | POA: Diagnosis not present

## 2018-02-21 DIAGNOSIS — M109 Gout, unspecified: Secondary | ICD-10-CM | POA: Insufficient documentation

## 2018-02-21 DIAGNOSIS — F17200 Nicotine dependence, unspecified, uncomplicated: Secondary | ICD-10-CM

## 2018-02-21 DIAGNOSIS — Z720 Tobacco use: Secondary | ICD-10-CM | POA: Diagnosis present

## 2018-02-21 DIAGNOSIS — I10 Essential (primary) hypertension: Secondary | ICD-10-CM | POA: Diagnosis present

## 2018-02-21 HISTORY — DX: Acute respiratory failure with hypoxia: J96.01

## 2018-02-21 LAB — CBC WITH DIFFERENTIAL/PLATELET
BASOS ABS: 0 10*3/uL (ref 0.0–0.1)
BASOS PCT: 0 %
EOS ABS: 0.1 10*3/uL (ref 0.0–0.7)
EOS PCT: 2 %
HCT: 30.4 % — ABNORMAL LOW (ref 36.0–46.0)
Hemoglobin: 10.5 g/dL — ABNORMAL LOW (ref 12.0–15.0)
Lymphocytes Relative: 22 %
Lymphs Abs: 1.3 10*3/uL (ref 0.7–4.0)
MCH: 29.2 pg (ref 26.0–34.0)
MCHC: 34.5 g/dL (ref 30.0–36.0)
MCV: 84.4 fL (ref 78.0–100.0)
MONO ABS: 0.8 10*3/uL (ref 0.1–1.0)
Monocytes Relative: 13 %
Neutro Abs: 3.6 10*3/uL (ref 1.7–7.7)
Neutrophils Relative %: 63 %
PLATELETS: 167 10*3/uL (ref 150–400)
RBC: 3.6 MIL/uL — ABNORMAL LOW (ref 3.87–5.11)
RDW: 13.3 % (ref 11.5–15.5)
WBC: 5.7 10*3/uL (ref 4.0–10.5)

## 2018-02-21 LAB — RAPID URINE DRUG SCREEN, HOSP PERFORMED
Amphetamines: NOT DETECTED
Benzodiazepines: NOT DETECTED
COCAINE: NOT DETECTED
OPIATES: NOT DETECTED
Tetrahydrocannabinol: NOT DETECTED

## 2018-02-21 LAB — I-STAT CG4 LACTIC ACID, ED: Lactic Acid, Venous: 0.85 mmol/L (ref 0.5–1.9)

## 2018-02-21 LAB — BASIC METABOLIC PANEL
ANION GAP: 8 (ref 5–15)
BUN: 18 mg/dL (ref 6–20)
CALCIUM: 8.8 mg/dL — AB (ref 8.9–10.3)
CO2: 27 mmol/L (ref 22–32)
CREATININE: 1.69 mg/dL — AB (ref 0.44–1.00)
Chloride: 110 mmol/L (ref 101–111)
GFR, EST AFRICAN AMERICAN: 35 mL/min — AB (ref >60)
GFR, EST NON AFRICAN AMERICAN: 30 mL/min — AB (ref >60)
Glucose, Bld: 107 mg/dL — ABNORMAL HIGH (ref 65–99)
Potassium: 3.8 mmol/L (ref 3.5–5.1)
SODIUM: 145 mmol/L (ref 135–145)

## 2018-02-21 MED ORDER — IPRATROPIUM-ALBUTEROL 0.5-2.5 (3) MG/3ML IN SOLN
3.0000 mL | Freq: Three times a day (TID) | RESPIRATORY_TRACT | Status: DC
  Administered 2018-02-22: 3 mL via RESPIRATORY_TRACT
  Filled 2018-02-21: qty 3

## 2018-02-21 MED ORDER — HYDRALAZINE HCL 20 MG/ML IJ SOLN
10.0000 mg | Freq: Four times a day (QID) | INTRAMUSCULAR | Status: DC | PRN
  Administered 2018-02-22 – 2018-02-24 (×5): 10 mg via INTRAVENOUS
  Filled 2018-02-21 (×5): qty 1

## 2018-02-21 MED ORDER — CEFTRIAXONE SODIUM 1 G IJ SOLR
1.0000 g | INTRAMUSCULAR | Status: DC
  Administered 2018-02-21: 1 g via INTRAVENOUS
  Filled 2018-02-21: qty 10

## 2018-02-21 MED ORDER — SODIUM CHLORIDE 0.9 % IV SOLN
500.0000 mg | INTRAVENOUS | Status: DC
  Administered 2018-02-22 – 2018-02-23 (×2): 500 mg via INTRAVENOUS
  Filled 2018-02-21 (×2): qty 500

## 2018-02-21 MED ORDER — METOPROLOL SUCCINATE ER 25 MG PO TB24
12.5000 mg | ORAL_TABLET | Freq: Every day | ORAL | Status: DC
  Administered 2018-02-21 – 2018-02-23 (×3): 12.5 mg via ORAL
  Filled 2018-02-21 (×3): qty 1

## 2018-02-21 MED ORDER — SODIUM CHLORIDE 0.9 % IV SOLN
500.0000 mg | INTRAVENOUS | Status: DC
  Administered 2018-02-21: 500 mg via INTRAVENOUS
  Filled 2018-02-21: qty 500

## 2018-02-21 MED ORDER — SODIUM CHLORIDE 0.9 % IV SOLN
1.0000 g | INTRAVENOUS | Status: DC
  Administered 2018-02-22 – 2018-02-23 (×2): 1 g via INTRAVENOUS
  Filled 2018-02-21 (×2): qty 1

## 2018-02-21 MED ORDER — SODIUM CHLORIDE 0.9 % IV SOLN
INTRAVENOUS | Status: DC
  Administered 2018-02-21: 17:00:00 via INTRAVENOUS

## 2018-02-21 MED ORDER — IPRATROPIUM-ALBUTEROL 0.5-2.5 (3) MG/3ML IN SOLN
3.0000 mL | Freq: Four times a day (QID) | RESPIRATORY_TRACT | Status: DC
  Administered 2018-02-21: 3 mL via RESPIRATORY_TRACT
  Filled 2018-02-21: qty 3

## 2018-02-21 MED ORDER — SODIUM CHLORIDE 0.9 % IV SOLN
INTRAVENOUS | Status: AC
  Administered 2018-02-21: 21:00:00 via INTRAVENOUS

## 2018-02-21 NOTE — ED Triage Notes (Signed)
Patient c/o SOB with productive cough since yesterday. Denies chest pain and N/V/D.

## 2018-02-21 NOTE — ED Notes (Signed)
ED TO INPATIENT HANDOFF REPORT  Name/Age/Gender [name] 72 y.o. female  Code Status    Code Status Orders  (From admission, onward)        Start     Ordered   02/21/18 1858  Full code  Continuous     02/21/18 1858    Code Status History    Date Active Date Inactive Code Status Order ID Comments User Context   12/03/2017 1324 12/03/2017 1927 Full Code [id number]  Nelva Bush, MD Inpatient   06/25/2015 0537 06/30/2015 1701 Full Code [id number]  Archie Patten, MD Inpatient   10/14/2014 1554 10/15/2014 2042 DNR [id number]  Lorna Few, DO Inpatient   12/07/2013 0441 12/09/2013 1923 Full Code [id number]  Leo Grosser, RN Inpatient      Home/SNF/Other Home  Chief Complaint Shortness of Breath   Level of Care/Admitting Diagnosis ED Disposition    ED Disposition Condition [facility] Area: [facility] [100102]  Level of Care: Med-Surg [16]  Diagnosis: CAP (community acquired pneumonia) [854627]  Admitting Physician: Phillips Grout [[id number]]  Attending Physician: Derrill Kay A [[id number]]  PT Class (Do Not Modify): Observation [104]  PT Acc Code (Do Not Modify): Observation [10022]       Medical History Past Medical History:  Diagnosis Date  . Acute blood loss anemia 06/26/2015  . Acute blood loss anemia 06/26/2015  . Aortic atherosclerosis ([place]) 01/01/2018   CT 04/2017  . Asthma   . Chronic kidney disease (CKD), stage III (moderate) ([place]) 06/27/2015  . DIVERTICULAR BLEEDING, HX OF 10/14/2007   Colonoscopy 2008 showed diverticulosis.   [name] GERD (gastroesophageal reflux disease)   . Gout   . History of anemia   . History of asthma   . Hyperlipemia   . Hypertension   . Joint pain   . Lipoma   . SOB (shortness of breath) 03/18/2016    Allergies Allergies  Allergen Reactions  . Ace Inhibitors Swelling and Other (See Comments)    Eyes swell  . Lisinopril Swelling and Other (See Comments)    Swollen tongue   . Tramadol  Itching    IV Location/Drains/Wounds Patient Lines/Drains/Airways Status   Active Line/Drains/Airways    Name:   Placement date:   Placement time:   Site:   Days:   Incision (Closed) 06/27/15 Groin Right   06/27/15    2115     970          Labs/Imaging Results for orders placed or performed during the hospital encounter of 02/21/18 (from the past 48 hour(s))  CBC with Differential/Platelet     Status: Abnormal   Collection Time: 02/21/18  5:05 PM  Result Value Ref Range   WBC 5.7 4.0 - 10.5 K/uL   RBC 3.60 (L) 3.87 - 5.11 MIL/uL   Hemoglobin 10.5 (L) 12.0 - 15.0 g/dL   HCT 30.4 (L) 36.0 - 46.0 %   MCV 84.4 78.0 - 100.0 fL   MCH 29.2 26.0 - 34.0 pg   MCHC 34.5 30.0 - 36.0 g/dL   RDW 13.3 11.5 - 15.5 %   Platelets 167 150 - 400 K/uL   Neutrophils Relative % 63 %   Neutro Abs 3.6 1.7 - 7.7 K/uL   Lymphocytes Relative 22 %   Lymphs Abs 1.3 0.7 - 4.0 K/uL   Monocytes Relative 13 %   Monocytes Absolute 0.8 0.1 - 1.0 K/uL   Eosinophils Relative 2 %   Eosinophils Absolute  0.1 0.0 - 0.7 K/uL   Basophils Relative 0 %   Basophils Absolute 0.0 0.0 - 0.1 K/uL    Comment: Performed at [facility], [place] [address], [place], [place] [address]  Basic metabolic panel     Status: Abnormal   Collection Time: 02/21/18  5:05 PM  Result Value Ref Range   Sodium 145 135 - 145 mmol/L   Potassium 3.8 3.5 - 5.1 mmol/L   Chloride 110 101 - 111 mmol/L   CO2 27 22 - 32 mmol/L   Glucose, Bld 107 (H) 65 - 99 mg/dL   BUN 18 6 - 20 mg/dL   Creatinine, Ser 1.69 (H) 0.44 - 1.00 mg/dL   Calcium 8.8 (L) 8.9 - 10.3 mg/dL   GFR calc non Af Amer 30 (L) >60 mL/min   GFR calc Af Amer 35 (L) >60 mL/min    Comment: (NOTE) The eGFR has been calculated using the CKD EPI equation. This calculation has not been validated in all clinical situations. eGFR's persistently <60 mL/min signify possible Chronic Kidney Disease.    Anion gap 8 5 - 15    Comment: Performed at [facility], [place] [address], [place], [place] [address]  I-Stat CG4 Lactic Acid, ED     Status: None   Collection Time: 02/21/18  5:16 PM  Result Value Ref Range   Lactic Acid, Venous 0.85 0.5 - 1.9 mmol/L   Dg Chest 2 View  Result Date: 02/21/2018 CLINICAL DATA:  72 year old female with history of shortness of breath since yesterday. No chest pain or tightness. EXAM: CHEST - 2 VIEW COMPARISON:  Chest x-ray 10/09/2017. FINDINGS: Lung volumes are normal. Ill-defined airspace disease in the left lower lobe obscuring the left hemidiaphragm concerning for bronchopneumonia. Right lung is clear. No pleural effusions. No evidence of pulmonary edema. Heart size is mildly enlarged. Upper mediastinal contours are within normal limits. Aortic atherosclerosis. IMPRESSION: 1. Findings suggest left lower lobe bronchopneumonia. Followup PA and lateral chest X-ray is recommended in 3-4 weeks following trial of antibiotic therapy to ensure resolution and exclude underlying malignancy. 2. Mild cardiomegaly. 3. Aortic atherosclerosis. Electronically Signed   By: Vinnie Langton M.D.   On: 02/21/2018 16:30    Pending Labs Unresulted Labs (From admission, onward)   Start     Ordered   02/22/18 7672  Basic metabolic panel  Tomorrow morning,   R     02/21/18 1858   02/22/18 0500  CBC WITH DIFFERENTIAL  Tomorrow morning,   R     02/21/18 1858   02/21/18 1858  Culture, blood (routine x 2) Call MD if unable to obtain prior to antibiotics being given  BLOOD CULTURE X 2,   R    Comments:  If blood cultures drawn in Emergency Department - Do not draw and cancel order    02/21/18 1858   02/21/18 1858  Culture, sputum-assessment  Once,   R     02/21/18 1858   02/21/18 1858  Gram stain  Once,   R     02/21/18 1858   02/21/18 1858  HIV antibody (Routine Screening)  Once,   R     02/21/18 1858   02/21/18 1858  Strep pneumoniae urinary antigen  Once,   R     02/21/18 1858   02/21/18 1853  Urine rapid drug screen (hosp  performed)  STAT,   R     02/21/18 1853   02/21/18 1641  Culture, blood (Routine X  2) w Reflex to ID Panel  BLOOD CULTURE X 2,   R     02/21/18 1642      Vitals/Pain Today's Vitals   02/21/18 1634 02/21/18 1840 02/21/18 1841 02/21/18 1846  BP:  (!) 124/92 (!) 124/92   Pulse:  72 70   Resp:  19 19   Temp:      TempSrc:      SpO2:  92% 96% 96%  PainSc: 0-No pain       Isolation Precautions No active isolations  Medications Medications  azithromycin (ZITHROMAX) 500 mg in sodium chloride 0.9 % 250 mL IVPB (0 mg Intravenous Stopped 02/21/18 1826)  cefTRIAXone (ROCEPHIN) 1 g in sodium chloride 0.9 % 100 mL IVPB (0 g Intravenous Stopped 02/21/18 1907)  0.9 %  sodium chloride infusion (has no administration in time range)  cefTRIAXone (ROCEPHIN) 1 g in sodium chloride 0.9 % 100 mL IVPB (has no administration in time range)  azithromycin (ZITHROMAX) 500 mg in sodium chloride 0.9 % 250 mL IVPB (has no administration in time range)  ipratropium-albuterol (DUONEB) 0.5-2.5 (3) MG/3ML nebulizer solution 3 mL (has no administration in time range)    Mobility walks

## 2018-02-21 NOTE — H&P (Signed)
History and Physical    Jennifer Foster NLZ:[id number] DOB: 01-27-1946 DOA: 02/21/2018  PCP: [name], MD  Patient coming from: Home  Chief Complaint: Shortness of breath and cough  HPI: Jennifer Foster is a 72 y.o. female with medical history significant of A. fib, hypertension, asthma comes in with over 2 days of progressive worsening shortness of breath and productive cough.  She denies any fevers or chills.  She denies any chest pain.  She denies any lower extremity edema or swelling.  She denies any nausea vomiting or diarrhea.  She denies any recent antibiotics or recent hospitalizations.  Patient be referred for admission for pneumonia with mild hypoxia of 88% on room air with ambulation.  She does not require home supplemental oxygen.  Review of Systems: As per HPI otherwise 10 point review of systems negative.   Past Medical History:  Diagnosis Date  . Acute blood loss anemia 06/26/2015  . Acute blood loss anemia 06/26/2015  . Aortic atherosclerosis ([place]) 01/01/2018   CT 04/2017  . Asthma   . Chronic kidney disease (CKD), stage III (moderate) ([place]) 06/27/2015  . DIVERTICULAR BLEEDING, HX OF 10/14/2007   Colonoscopy 2008 showed diverticulosis.   [name] GERD (gastroesophageal reflux disease)   . Gout   . History of anemia   . History of asthma   . Hyperlipemia   . Hypertension   . Joint pain   . Lipoma   . SOB (shortness of breath) 03/18/2016    Past Surgical History:  Procedure Laterality Date  . ABDOMINAL HYSTERECTOMY  1981   partial, per pt history  . COLONOSCOPY N/A 12/08/2013   Procedure: COLONOSCOPY;  Surgeon: [name], MD;  Location: [place];  Service: Endoscopy;  Laterality: N/A;  . COLONOSCOPY N/A 06/28/2015   Procedure: COLONOSCOPY;  Surgeon: Clarene Essex, MD;  Location: [facility] ENDOSCOPY;  Service: Endoscopy;  Laterality: N/A;  . ESOPHAGOGASTRODUODENOSCOPY N/A 12/08/2013   Procedure: ESOPHAGOGASTRODUODENOSCOPY (EGD);  Surgeon: [name], MD;  Location:  [facility] ENDOSCOPY;  Service: Endoscopy;  Laterality: N/A;  . GIVENS CAPSULE STUDY N/A 12/08/2013   Procedure: GIVENS CAPSULE STUDY;  Surgeon: [name], MD;  Location: [place];  Service: Endoscopy;  Laterality: N/A;  . LEFT HEART CATH AND CORONARY ANGIOGRAPHY N/A 12/03/2017   Procedure: LEFT HEART CATH AND CORONARY ANGIOGRAPHY;  Surgeon: Nelva Bush, MD;  Location: [place] CV LAB;  Service: Cardiovascular;  Laterality: N/A;  . LIPOMA EXCISION  01/28/11   neck     reports that she has been smoking cigarettes.  She has been smoking about 0.25 packs per day. She has never used smokeless tobacco. She reports that she drinks alcohol. She reports that she does not use drugs.  Allergies  Allergen Reactions  . Ace Inhibitors Swelling and Other (See Comments)    Eyes swell  . Lisinopril Swelling and Other (See Comments)    Swollen tongue   . Tramadol Itching    Family History  Problem Relation Age of Onset  . Diabetes type II Unknown   . Kidney failure Unknown   . Kidney failure Son     Prior to Admission medications   Medication Sig Start Date End Date Taking? Authorizing Provider  nitroGLYCERIN (NITROSTAT) 0.4 MG SL tablet Place 1 tablet (0.4 mg total) under the tongue every 5 (five) minutes as needed for chest pain. If do not resolve after 1 tablet, go to ED 10/13/17  Yes Smiley Houseman, MD  amLODipine (NORVASC) 5 MG tablet  Take 7.5 mg by mouth daily.    [provider]  apixaban (ELIQUIS) 5 MG TABS tablet Take 1 tablet (5 mg total) by mouth 2 (two) times daily. ON HOLD 12/01/17 11/30/17   End, Harrell Gave, MD  baclofen 5 MG TABS Take 5 mg by mouth 2 (two) times daily as needed for muscle spasms. 08/07/17   [name], MD  irbesartan (AVAPRO) 150 MG tablet Take 150 mg by mouth daily. 01/25/18   [provider]  irbesartan (AVAPRO) 300 MG tablet Take 1 tablet (300 mg total) by mouth daily. 01/01/18   Richardson Dopp T, PA-C  metoprolol succinate (TOPROL XL) 25  MG 24 hr tablet Take half tablet by mouth twice a day Patient taking differently: Take 25 mg by mouth daily.  10/15/17   End, Harrell Gave, MD  nicotine (NICODERM CQ - DOSED IN MG/24 HR) 7 mg/24hr patch PLACE 1 PATCH (7 MG TOTAL) ONTO THE SKIN DAILY 02/09/17   Andrena Mews T, MD  rosuvastatin (CRESTOR) 20 MG tablet TAKE 1 TABLET BY MOUTH AT BEDTIME 01/26/18   [name], MD    Physical Exam: Vitals:   02/21/18 1540 02/21/18 1840 02/21/18 1841 02/21/18 1846  BP: (!) 133/109 (!) 124/92 (!) 124/92   Pulse: 60 72 70   Resp: (!) 24 19 19    Temp: 97.8 F (36.6 C)     TempSrc: Oral     SpO2: 96% 92% 96% 96%      Constitutional: NAD, calm, comfortable Vitals:   02/21/18 1540 02/21/18 1840 02/21/18 1841 02/21/18 1846  BP: (!) 133/109 (!) 124/92 (!) 124/92   Pulse: 60 72 70   Resp: (!) 24 19 19    Temp: 97.8 F (36.6 C)     TempSrc: Oral     SpO2: 96% 92% 96% 96%   Eyes: PERRL, lids and conjunctivae normal ENMT: Mucous membranes are moist. Posterior pharynx clear of any exudate or lesions.Normal dentition.  Neck: normal, supple, no masses, no thyromegaly Respiratory: Diminished but clear to auscultation bilaterally, no wheezing, no crackles. Normal respiratory effort. No accessory muscle use.  Cardiovascular: Regular rate and rhythm, no murmurs / rubs / gallops. No extremity edema. 2+ pedal pulses. No carotid bruits.  Abdomen: no tenderness, no masses palpated. No hepatosplenomegaly. Bowel sounds positive.  Musculoskeletal: no clubbing / cyanosis. No joint deformity upper and lower extremities. Good ROM, no contractures. Normal muscle tone.  Skin: no rashes, lesions, ulcers. No induration Neurologic: CN 2-12 grossly intact. Sensation intact, DTR normal. Strength 5/5 in all 4.  Psychiatric: Normal judgment and insight. Alert and oriented x 3. Normal mood.    Labs on Admission: I have personally reviewed following labs and imaging studies  CBC: Recent Labs  Lab 02/21/18 1705    WBC 5.7  NEUTROABS 3.6  HGB 10.5*  HCT 30.4*  MCV 84.4  PLT 503   Basic Metabolic Panel: Recent Labs  Lab 02/21/18 1705  NA 145  K 3.8  CL 110  CO2 27  GLUCOSE 107*  BUN 18  CREATININE 1.69*  CALCIUM 8.8*   GFR: CrCl cannot be calculated (Unknown ideal weight.). Liver Function Tests: No results for input(s): AST, ALT, ALKPHOS, BILITOT, PROT, ALBUMIN in the last 168 hours. No results for input(s): LIPASE, AMYLASE in the last 168 hours. No results for input(s): AMMONIA in the last 168 hours. Coagulation Profile: No results for input(s): INR, PROTIME in the last 168 hours. Cardiac Enzymes: No results for input(s): CKTOTAL, CKMB, CKMBINDEX, TROPONINI in the  last 168 hours. BNP (last 3 results) No results for input(s): PROBNP in the last 8760 hours. HbA1C: No results for input(s): HGBA1C in the last 72 hours. CBG: No results for input(s): GLUCAP in the last 168 hours. Lipid Profile: No results for input(s): CHOL, HDL, LDLCALC, TRIG, CHOLHDL, LDLDIRECT in the last 72 hours. Thyroid Function Tests: No results for input(s): TSH, T4TOTAL, FREET4, T3FREE, THYROIDAB in the last 72 hours. Anemia Panel: No results for input(s): VITAMINB12, FOLATE, FERRITIN, TIBC, IRON, RETICCTPCT in the last 72 hours. Urine analysis:    Component Value Date/Time   COLORURINE YELLOW 02/21/2017 [place] 02/21/2017 1238   LABSPEC 1.009 02/21/2017 1238   PHURINE 5.0 02/21/2017 1238   GLUCOSEU NEGATIVE 02/21/2017 1238   HGBUR NEGATIVE 02/21/2017 1238   BILIRUBINUR NEGATIVE 02/21/2017 1238   BILIRUBINUR SMALL 03/18/2016 1054   KETONESUR NEGATIVE 02/21/2017 1238   PROTEINUR 100 (A) 02/21/2017 1238   UROBILINOGEN 0.2 03/18/2016 1054   UROBILINOGEN 0.2 06/25/2015 0920   NITRITE NEGATIVE 02/21/2017 1238   LEUKOCYTESUR NEGATIVE 02/21/2017 1238   Sepsis Labs: !!!!!!!!!!!!!!!!!!!!!!!!!!!!!!!!!!!!!!!!!!!! @LABRCNTIP (procalcitonin:4,lacticidven:4) )No results found for this or any  previous visit (from the past 240 hour(s)).   Radiological Exams on Admission: Dg Chest 2 View  Result Date: 02/21/2018 CLINICAL DATA:  72 year old female with history of shortness of breath since yesterday. No chest pain or tightness. EXAM: CHEST - 2 VIEW COMPARISON:  Chest x-ray 10/09/2017. FINDINGS: Lung volumes are normal. Ill-defined airspace disease in the left lower lobe obscuring the left hemidiaphragm concerning for bronchopneumonia. Right lung is clear. No pleural effusions. No evidence of pulmonary edema. Heart size is mildly enlarged. Upper mediastinal contours are within normal limits. Aortic atherosclerosis. IMPRESSION: 1. Findings suggest left lower lobe bronchopneumonia. Followup PA and lateral chest X-ray is recommended in 3-4 weeks following trial of antibiotic therapy to ensure resolution and exclude underlying malignancy. 2. Mild cardiomegaly. 3. Aortic atherosclerosis. Electronically Signed   By: Vinnie Langton M.D.   On: 02/21/2018 16:30   Oh chart reviewed Case discussed with Dr. Asa Saunas in the ED Chest x-ray reviewed with left lower lobe pneumonia  Assessment/Plan 72 year old female with  community-acquired  pneumonia mild hypoxia Principal Problem:   CAP (community acquired pneumonia)-IV Rocephin and azithromycin.  Duo nebs.  Supplemental oxygen.  Mildly hypoxic on ambulation.  Active Problems:   Acute respiratory failure with hypoxia (HCC)-drops to 88% on room air with ambulation.  Treat pneumonia and bronchodilators.    Generalized anxiety disorder-stable    Essential hypertension-clarify home meds and resume    Cocaine abuse (HCC)-check urine drug screen    Chronic kidney disease, stage 3 (HCC)-creatinine at 1.6 baseline is about 1.4 gentle IV fluids overnight for 12 hours and repeat in the morning    Anxiety and depression-stable    Aneurysm of thoracic aorta (HCC)-stable    Atrial fibrillation (HCC)-currently rate controlled   Med rec pending pharmacy  review   DVT prophylaxis: SCDs Code Status: Full Family Communication: Husband Disposition Plan: Likely tomorrow  Consults called: None Admission status: Observation   DAVID,RACHAL A MD [facility]  If 7PM-7AM, please contact night-coverage [email or web address] Password [facility]  02/21/2018, 6:53 PM

## 2018-02-21 NOTE — ED Provider Notes (Signed)
[place] DEPT Provider Note   CSN: [id number] Arrival date & time: 02/21/18  1517     History   Chief Complaint Chief Complaint  Patient presents with  . Shortness of Breath    HPI Jennifer Foster is a 72 y.o. female.  This is a 72 year old female presents with a cough or shortness of breath x24 hours.  Has noted increased dyspnea on exertion.  Denies any URI symptoms.  No chest pain or chest pressure.  No anginal or CHF symptoms.  Symptoms are better with rest.  No vomiting or diarrhea.  Has used Alka-Seltzer cold medication without relief.     Past Medical History:  Diagnosis Date  . Acute blood loss anemia 06/26/2015  . Acute blood loss anemia 06/26/2015  . Aortic atherosclerosis ([place]) 01/01/2018   CT 04/2017  . Asthma   . Chronic kidney disease (CKD), stage III (moderate) ([place]) 06/27/2015  . DIVERTICULAR BLEEDING, HX OF 10/14/2007   Colonoscopy 2008 showed diverticulosis.   [name] GERD (gastroesophageal reflux disease)   . Gout   . History of anemia   . History of asthma   . Hyperlipemia   . Hypertension   . Joint pain   . Lipoma   . SOB (shortness of breath) 03/18/2016    Patient Active Problem List   Diagnosis Date Noted  . Aortic atherosclerosis ([place]) 01/01/2018  . Abnormal stress test 12/03/2017  . Coronary artery disease involving native coronary artery of native heart without angina pectoris 11/27/2017  . Atrial fibrillation ([place]) 10/13/2017  . Neck pain 08/07/2017  . Multinodular goiter 04/14/2017  . Adrenal adenoma 04/14/2017  . Aneurysm of thoracic aorta ([place]) 04/03/2017  . Muscle spasm 03/31/2017  . Irregular heart rhythm 03/06/2017  . Loss of weight 01/30/2017  . Dizziness 11/25/2016  . Dermatitis 08/15/2016  . Proteinuria 01/11/2016  . Anxiety and depression 12/25/2015  . Chronic kidney disease, stage 3 ([place]) 06/27/2015  . Essential hypertension   . Cocaine abuse ([place])   . Headache 02/20/2015  . Gout 02/28/2014    . Left knee pain 10/12/2013  . Back pain 07/20/2012  . Lipoma 03/19/2011  . Hyperlipidemia 01/01/2011  . ANEMIA 11/23/2009  . TOBACCO USER 09/03/2009  . Generalized anxiety disorder 08/13/2009    Past Surgical History:  Procedure Laterality Date  . ABDOMINAL HYSTERECTOMY  1981   partial, per pt history  . COLONOSCOPY N/A 12/08/2013   Procedure: COLONOSCOPY;  Surgeon: [name], MD;  Location: [place];  Service: Endoscopy;  Laterality: N/A;  . COLONOSCOPY N/A 06/28/2015   Procedure: COLONOSCOPY;  Surgeon: Clarene Essex, MD;  Location: [facility] ENDOSCOPY;  Service: Endoscopy;  Laterality: N/A;  . ESOPHAGOGASTRODUODENOSCOPY N/A 12/08/2013   Procedure: ESOPHAGOGASTRODUODENOSCOPY (EGD);  Surgeon: [name], MD;  Location: [facility] ENDOSCOPY;  Service: Endoscopy;  Laterality: N/A;  . GIVENS CAPSULE STUDY N/A 12/08/2013   Procedure: GIVENS CAPSULE STUDY;  Surgeon: [name], MD;  Location: [place];  Service: Endoscopy;  Laterality: N/A;  . LEFT HEART CATH AND CORONARY ANGIOGRAPHY N/A 12/03/2017   Procedure: LEFT HEART CATH AND CORONARY ANGIOGRAPHY;  Surgeon: Nelva Bush, MD;  Location: [place] CV LAB;  Service: Cardiovascular;  Laterality: N/A;  . LIPOMA EXCISION  01/28/11   neck     OB History   None      Home Medications    Prior to Admission medications   Medication Sig Start Date End Date Taking? Authorizing Provider  amLODipine (NORVASC) 5 MG tablet Take  7.5 mg by mouth daily.    [provider]  apixaban (ELIQUIS) 5 MG TABS tablet Take 1 tablet (5 mg total) by mouth 2 (two) times daily. ON HOLD 12/01/17 11/30/17   End, Harrell Gave, MD  baclofen 5 MG TABS Take 5 mg by mouth 2 (two) times daily as needed for muscle spasms. 08/07/17   [name], MD  irbesartan (AVAPRO) 300 MG tablet Take 1 tablet (300 mg total) by mouth daily. 01/01/18   Richardson Dopp T, PA-C  metoprolol succinate (TOPROL XL) 25 MG 24 hr tablet Take half tablet by mouth twice a day Patient  taking differently: Take 25 mg by mouth daily.  10/15/17   End, Harrell Gave, MD  nicotine (NICODERM CQ - DOSED IN MG/24 HR) 7 mg/24hr patch PLACE 1 PATCH (7 MG TOTAL) ONTO THE SKIN DAILY 02/09/17   [name], MD  nitroGLYCERIN (NITROSTAT) 0.4 MG SL tablet Place 1 tablet (0.4 mg total) under the tongue every 5 (five) minutes as needed for chest pain. If do not resolve after 1 tablet, go to ED Patient not taking: Reported on 01/26/2018 10/13/17   Smiley Houseman, MD  rosuvastatin (CRESTOR) 20 MG tablet TAKE 1 TABLET BY MOUTH AT BEDTIME 01/26/18   [name], MD    Family History Family History  Problem Relation Age of Onset  . Diabetes type II Unknown   . Kidney failure Unknown   . Kidney failure Son     Social History Social History   Tobacco Use  . Smoking status: Current Some Day Smoker    Packs/day: 0.25    Types: Cigarettes  . Smokeless tobacco: Never Used  Substance Use Topics  . Alcohol use: Yes    Alcohol/week: 0.0 oz    Comment: occasionally  . Drug use: No     Allergies   Ace inhibitors; Lisinopril; and Tramadol   Review of Systems Review of Systems  All other systems reviewed and are negative.    Physical Exam Updated Vital Signs BP (!) 133/109 (BP Location: Right Arm)   Pulse 60   Temp 97.8 F (36.6 C) (Oral)   Resp (!) 24   SpO2 96%   Physical Exam  Constitutional: She is oriented to person, place, and time. She appears well-developed and well-nourished.  Non-toxic appearance. No distress.  HENT:  Head: Normocephalic and atraumatic.  Eyes: Pupils are equal, round, and reactive to light. Conjunctivae, EOM and lids are normal.  Neck: Normal range of motion. Neck supple. No tracheal deviation present. No thyroid mass present.  Cardiovascular: Normal rate, regular rhythm and normal heart sounds. Exam reveals no gallop.  No murmur heard. Pulmonary/Chest: Effort normal and breath sounds normal. No stridor. No respiratory distress. She has  no decreased breath sounds. She has no wheezes. She has no rhonchi. She has no rales.  Abdominal: Soft. Normal appearance and bowel sounds are normal. She exhibits no distension. There is no tenderness. There is no rebound and no CVA tenderness.  Musculoskeletal: Normal range of motion. She exhibits no edema or tenderness.  Neurological: She is alert and oriented to person, place, and time. She has normal strength. No cranial nerve deficit or sensory deficit. GCS eye subscore is 4. GCS verbal subscore is 5. GCS motor subscore is 6.  Skin: Skin is warm and dry. No abrasion and no rash noted.  Psychiatric: She has a normal mood and affect. Her speech is normal and behavior is normal.  Nursing note and vitals reviewed.  ED Treatments / Results  Labs (all labs ordered are listed, but only abnormal results are displayed) Labs Reviewed - No data to display  EKG None  Radiology Dg Chest 2 View  Result Date: 02/21/2018 CLINICAL DATA:  72 year old female with history of shortness of breath since yesterday. No chest pain or tightness. EXAM: CHEST - 2 VIEW COMPARISON:  Chest x-ray 10/09/2017. FINDINGS: Lung volumes are normal. Ill-defined airspace disease in the left lower lobe obscuring the left hemidiaphragm concerning for bronchopneumonia. Right lung is clear. No pleural effusions. No evidence of pulmonary edema. Heart size is mildly enlarged. Upper mediastinal contours are within normal limits. Aortic atherosclerosis. IMPRESSION: 1. Findings suggest left lower lobe bronchopneumonia. Followup PA and lateral chest X-ray is recommended in 3-4 weeks following trial of antibiotic therapy to ensure resolution and exclude underlying malignancy. 2. Mild cardiomegaly. 3. Aortic atherosclerosis. Electronically Signed   By: Vinnie Langton M.D.   On: 02/21/2018 16:30    Procedures Procedures (including critical care time)  Medications Ordered in ED Medications  0.9 %  sodium chloride infusion (has no  administration in time range)  azithromycin (ZITHROMAX) 500 mg in sodium chloride 0.9 % 250 mL IVPB (has no administration in time range)  cefTRIAXone (ROCEPHIN) 1 g in sodium chloride 0.9 % 100 mL IVPB (has no administration in time range)     Initial Impression / Assessment and Plan / ED Course  I have reviewed the triage vital signs and the nursing notes.  Pertinent labs & imaging results that were available during my care of the patient were reviewed by me and considered in my medical decision making (see chart for details).     Patient hypoxic with ambulation in the ED down to the high 80 percentile.  Started on IV antibiotics and will be admitted for pneumonia.  Final Clinical Impressions(s) / ED Diagnoses   Final diagnoses:  None    ED Discharge Orders    None       Lacretia Leigh, MD 02/21/18 [phone]

## 2018-02-21 NOTE — ED Notes (Signed)
Pt returned from walking to bathroom and oxygenation had dropped down to 88% when put back on monitor. Dr. Zenia Resides was in room and saw as well, pt put on 2L

## 2018-02-22 DIAGNOSIS — N183 Chronic kidney disease, stage 3 (moderate): Secondary | ICD-10-CM | POA: Diagnosis not present

## 2018-02-22 DIAGNOSIS — I480 Paroxysmal atrial fibrillation: Secondary | ICD-10-CM

## 2018-02-22 DIAGNOSIS — I712 Thoracic aortic aneurysm, without rupture: Secondary | ICD-10-CM | POA: Diagnosis not present

## 2018-02-22 DIAGNOSIS — J9601 Acute respiratory failure with hypoxia: Secondary | ICD-10-CM | POA: Diagnosis not present

## 2018-02-22 DIAGNOSIS — J181 Lobar pneumonia, unspecified organism: Secondary | ICD-10-CM

## 2018-02-22 DIAGNOSIS — F411 Generalized anxiety disorder: Secondary | ICD-10-CM | POA: Diagnosis not present

## 2018-02-22 DIAGNOSIS — F1410 Cocaine abuse, uncomplicated: Secondary | ICD-10-CM | POA: Diagnosis not present

## 2018-02-22 DIAGNOSIS — F419 Anxiety disorder, unspecified: Secondary | ICD-10-CM | POA: Diagnosis not present

## 2018-02-22 DIAGNOSIS — F329 Major depressive disorder, single episode, unspecified: Secondary | ICD-10-CM | POA: Diagnosis not present

## 2018-02-22 LAB — CBC WITH DIFFERENTIAL/PLATELET
Basophils Absolute: 0 10*3/uL (ref 0.0–0.1)
Basophils Relative: 0 %
EOS ABS: 0.1 10*3/uL (ref 0.0–0.7)
Eosinophils Relative: 1 %
HCT: 29.6 % — ABNORMAL LOW (ref 36.0–46.0)
Hemoglobin: 10 g/dL — ABNORMAL LOW (ref 12.0–15.0)
Lymphocytes Relative: 18 %
Lymphs Abs: 1.2 10*3/uL (ref 0.7–4.0)
MCH: 28.3 pg (ref 26.0–34.0)
MCHC: 33.8 g/dL (ref 30.0–36.0)
MCV: 83.9 fL (ref 78.0–100.0)
MONOS PCT: 11 %
Monocytes Absolute: 0.7 10*3/uL (ref 0.1–1.0)
NEUTROS ABS: 4.6 10*3/uL (ref 1.7–7.7)
NEUTROS PCT: 70 %
Platelets: 163 10*3/uL (ref 150–400)
RBC: 3.53 MIL/uL — AB (ref 3.87–5.11)
RDW: 13.4 % (ref 11.5–15.5)
WBC: 6.5 10*3/uL (ref 4.0–10.5)

## 2018-02-22 LAB — BASIC METABOLIC PANEL
ANION GAP: 7 (ref 5–15)
BUN: 14 mg/dL (ref 6–20)
CHLORIDE: 113 mmol/L — AB (ref 101–111)
CO2: 24 mmol/L (ref 22–32)
CREATININE: 1.5 mg/dL — AB (ref 0.44–1.00)
Calcium: 8.4 mg/dL — ABNORMAL LOW (ref 8.9–10.3)
GFR calc non Af Amer: 34 mL/min — ABNORMAL LOW (ref >60)
GFR, EST AFRICAN AMERICAN: 40 mL/min — AB (ref >60)
Glucose, Bld: 108 mg/dL — ABNORMAL HIGH (ref 65–99)
Potassium: 3.6 mmol/L (ref 3.5–5.1)
SODIUM: 144 mmol/L (ref 135–145)

## 2018-02-22 LAB — STREP PNEUMONIAE URINARY ANTIGEN: Strep Pneumo Urinary Antigen: NEGATIVE

## 2018-02-22 LAB — HIV ANTIBODY (ROUTINE TESTING W REFLEX): HIV Screen 4th Generation wRfx: NONREACTIVE

## 2018-02-22 MED ORDER — SODIUM CHLORIDE 0.9 % IV SOLN: INTRAVENOUS | Status: DC

## 2018-02-22 MED ORDER — LEVALBUTEROL HCL 0.63 MG/3ML IN NEBU
0.6300 mg | INHALATION_SOLUTION | Freq: Three times a day (TID) | RESPIRATORY_TRACT | Status: DC
Start: 2018-02-22 — End: 2018-02-24
  Administered 2018-02-22 – 2018-02-24 (×6): 0.63 mg via RESPIRATORY_TRACT
  Filled 2018-02-22 (×6): qty 3

## 2018-02-22 MED ORDER — GUAIFENESIN ER 600 MG PO TB12
1200.0000 mg | ORAL_TABLET | Freq: Two times a day (BID) | ORAL | Status: DC
  Administered 2018-02-22 – 2018-02-24 (×5): 1200 mg via ORAL
  Filled 2018-02-22 (×5): qty 2

## 2018-02-22 MED ORDER — ROSUVASTATIN CALCIUM 10 MG PO TABS
20.0000 mg | ORAL_TABLET | Freq: Every day | ORAL | Status: DC
Start: 2018-02-22 — End: 2018-02-24
  Administered 2018-02-22 – 2018-02-23 (×2): 20 mg via ORAL
  Filled 2018-02-22 (×2): qty 2

## 2018-02-22 MED ORDER — IPRATROPIUM BROMIDE 0.02 % IN SOLN
0.5000 mg | Freq: Three times a day (TID) | RESPIRATORY_TRACT | Status: DC
  Administered 2018-02-22 – 2018-02-24 (×6): 0.5 mg via RESPIRATORY_TRACT
  Filled 2018-02-22 (×6): qty 2.5

## 2018-02-22 MED ORDER — APIXABAN 5 MG PO TABS
5.0000 mg | ORAL_TABLET | Freq: Two times a day (BID) | ORAL | Status: DC
Start: 2018-02-22 — End: 2018-02-24
  Administered 2018-02-22 – 2018-02-24 (×5): 5 mg via ORAL
  Filled 2018-02-22 (×5): qty 1

## 2018-02-22 MED ORDER — LEVALBUTEROL HCL 0.63 MG/3ML IN NEBU: 0.6300 mg | INHALATION_SOLUTION | Freq: Four times a day (QID) | RESPIRATORY_TRACT | Status: DC

## 2018-02-22 MED ORDER — IPRATROPIUM BROMIDE 0.02 % IN SOLN: 0.5000 mg | Freq: Four times a day (QID) | RESPIRATORY_TRACT | Status: DC

## 2018-02-22 MED ORDER — NITROGLYCERIN 0.4 MG SL SUBL: 0.4000 mg | SUBLINGUAL_TABLET | SUBLINGUAL | Status: DC | PRN

## 2018-02-22 MED ORDER — ALBUTEROL SULFATE (2.5 MG/3ML) 0.083% IN NEBU: 2.5000 mg | INHALATION_SOLUTION | RESPIRATORY_TRACT | Status: DC | PRN

## 2018-02-22 NOTE — Progress Notes (Signed)
PROGRESS NOTE    Jennifer Foster  OAC:[id number] DOB: 11-Jun-1946 DOA: 02/21/2018 PCP: [name], MD   Brief Narrative:  Jennifer Foster is a 72 y.o. female with medical history significant of A. fib, hypertension, asthma, and other comorbids comes in with over 2 days of progressive worsening shortness of breath and productive cough.  She denies any recent antibiotics or recent hospitalizations. Patient be referred for admission for pneumonia with mild hypoxia of 88% on room air with ambulation and she does not use home supplemental oxygen. Started on Empiric Abx and Breathing Treatments.   Assessment & Plan:   Principal Problem:   CAP (community acquired pneumonia) Active Problems:   Generalized anxiety disorder   Essential hypertension   Cocaine abuse (HCC)   Chronic kidney disease, stage 3 (HCC)   Anxiety and depression   Aneurysm of thoracic aorta (HCC)   Atrial fibrillation (HCC)   Acute respiratory failure with hypoxia (HCC)  Acute Respiratory Failure with Hypoxia 2/2 to Left Lower Lobe PNA -Noted to be hypoxic on room air with ambulation and dropped to 88% -Continue with supplemental oxygen via nasal cannula and wean oxygen as tolerated -Continuous pulse oximetry and maintain O2 saturation greater than 92% -Chest x-ray on admission showed findings suggestive left lower lobe bronchopneumonia -Continue with flutter valve, incentive spirometer, guaifenesin 1200 mg p.o. twice daily -Continue to monitor respiratory status and repeat chest x-ray in the a.m. -We will need to Home Ambulatory screen prior to discharge  Left Lower Lobe PNA -As above -Continue with empiric antibiotics with IV azithromycin IV ceftriaxone -Started on duo nebs but will change to ipratropium/levalbuterol every 6 scheduled given her history of atrial fibrillation -We will also add albuterol 2.5 mg q. 4 PRN for wheezing and shortness of breath -Checking respiratory virus panel and placing on empiric  droplet precautions -Strep pneumo urinary antigen is negative; order urine Legionella antigen is pending -Blood cultures x2 are pending next line-we will try to obtain sputum culture -Continue with incentive spirometer, flutter valve, and guaifenesin 200 g p.o. twice daily  HTN -Continue with metoprolol succinate 12.5 mg p.o. Daily.  We will currently hold patient's Irbesartan 300 mg p.o. Daily -Start the patient on IV hydralazine 10 mg every 6 PRN for systolic blood pressure greater than 010 or diastolic blood pressure in the 100  HLD -Continue with Rosuvastatin 20 mg p.o. daily  GERD  -Currently not on any medications per [facility]  Gout -Currently not on any medications per MAR  AKI on CKD Stage 3 -Patient's BUN/creatinine went from 18/1.69 and went 14/1.50; Previous baseline rangng from 1.2-1.4 -Avoid nephrotoxic medications if possible and currently hold Rrbesartan 200 mg p.o. Daily for now  -Continue with IV fluid hydration with normal saline at rate of 75 mL/hr for 24 hours -Continue monitor and repeat CMP in a.m.  Atrial Fibrillation -Continue with Metoprolol Succinate 12.5 mg p.o. Daily -Continue with Apixaban 5 mg p.o. twice daily -He is rate controlled and is not on telemetry   Normocytic Anemia -Likely in setting of chronic kidney disease -Patient's hemoglobin/hematocrit went from 10.5/30.4 and is now 10.0/29.6 -Anemia panel in the a.m. -Continue to monitor for signs and symptoms of bleeding -Repeat CBC in the a.m.  Generalize Anxiety/Depression -Stable  Hx of Aneurysm of Thoracic Aorta  -Stable. CT Scan showed Atherosclerotic and mildly aneurysmal ascending thoracic aorta with a maximal diameter of 4.1 in 04/03/17 -Follow up Imaging as an outpatient with annual imaging followup by CTA or MRA  DVT prophylaxis: Anticoagulated with Apixaban 5 mg po BID  Code Status: FULL CODE  Family Communication: No family present at bedside  Disposition Plan: Remain Inpatient for  workup and treatment and D/C Home when medically stable   Consultants:   None   Procedures: None   Antimicrobials:  Anti-infectives (From admission, onward)   Start     Dose/Rate Route Frequency Ordered Stop   02/22/18 1800  cefTRIAXone (ROCEPHIN) 1 g in sodium chloride 0.9 % 100 mL IVPB     1 g 200 mL/hr over 30 Minutes Intravenous Every 24 hours 02/21/18 1858 02/28/18 1759   02/22/18 1700  azithromycin (ZITHROMAX) 500 mg in sodium chloride 0.9 % 250 mL IVPB     500 mg 250 mL/hr over 60 Minutes Intravenous Every 24 hours 02/21/18 1858 02/28/18 1659   02/21/18 1645  azithromycin (ZITHROMAX) 500 mg in sodium chloride 0.9 % 250 mL IVPB  Status:  Discontinued     500 mg 250 mL/hr over 60 Minutes Intravenous Every 24 hours 02/21/18 1642 02/22/18 0738   02/21/18 1645  cefTRIAXone (ROCEPHIN) 1 g in sodium chloride 0.9 % 100 mL IVPB  Status:  Discontinued     1 g 200 mL/hr over 30 Minutes Intravenous Every 24 hours 02/21/18 1642 02/22/18 0738     Subjective: Seen and examined at bedside was still complaining of some shortness of breath and had a productive cough.  No chest pain lightheadedness or dizziness.  Denies any leg swelling.  No other concerns or complaints at this time is wanting to know when she can go home.  Objective: Vitals:   02/21/18 2314 02/22/18 0137 02/22/18 0519 02/22/18 0717  BP: (!) 200/83 (!) 169/70 (!) 165/85   Pulse:   61   Resp:   15   Temp:   97.8 F (36.6 C)   TempSrc:   Oral   SpO2:   98% 97%    Intake/Output Summary (Last 24 hours) at 02/22/2018 0810 Last data filed at 02/22/2018 0700 Gross per 24 hour  Intake 1250 ml  Output 1000 ml  Net 250 ml   There were no vitals filed for this visit.  Examination: Physical Exam:  Constitutional: WN/WD AAF in  NAD and appears calm Eyes: Lids and conjunctivae normal, sclerae anicteric  ENMT: External Ears, Nose appear normal. Grossly normal hearing. Poor dentition  Neck: Appears normal, supple, no  cervical masses, normal ROM, no appreciable thyromegaly, no JVD Respiratory: Diminished to auscultation bilaterally with left sided Rhonchi. Normal respiratory effort and patient is not tachypenic. No accessory muscle use but is wearing supplemental O2 via [place].  Cardiovascular: Irregularly Irregular, no murmurs / rubs / gallops. S1 and S2 auscultated. No LE extremity edema. Abdomen: Soft, non-tender, Slightly distended. No masses palpated. No appreciable hepatosplenomegaly. Bowel sounds positive x4.  GU: Deferred. Musculoskeletal: No clubbing / cyanosis of digits/nails. No joint deformity upper and lower extremities.  Skin: No rashes, lesions, ulcers on a limited skin evaluation. No induration; Warm and dry.  Neurologic: CN 2-12 grossly intact with no focal deficits. Romberg sign and cerebellar reflexes not assessed.  Psychiatric: Normal judgment and insight. Alert and oriented x 3. Normal mood and appropriate affect.   Data Reviewed: I have personally reviewed following labs and imaging studies  CBC: Recent Labs  Lab 02/21/18 1705 02/22/18 0527  WBC 5.7 6.5  NEUTROABS 3.6 4.6  HGB 10.5* 10.0*  HCT 30.4* 29.6*  MCV 84.4 83.9  PLT 167 676   Basic Metabolic Panel: Recent  Labs  Lab 02/21/18 1705 02/22/18 0527  NA 145 144  K 3.8 3.6  CL 110 113*  CO2 27 24  GLUCOSE 107* 108*  BUN 18 14  CREATININE 1.69* 1.50*  CALCIUM 8.8* 8.4*   GFR: CrCl cannot be calculated (Unknown ideal weight.). Liver Function Tests: No results for input(s): AST, ALT, ALKPHOS, BILITOT, PROT, ALBUMIN in the last 168 hours. No results for input(s): LIPASE, AMYLASE in the last 168 hours. No results for input(s): AMMONIA in the last 168 hours. Coagulation Profile: No results for input(s): INR, PROTIME in the last 168 hours. Cardiac Enzymes: No results for input(s): CKTOTAL, CKMB, CKMBINDEX, TROPONINI in the last 168 hours. BNP (last 3 results) No results for input(s): PROBNP in the last 8760  hours. HbA1C: No results for input(s): HGBA1C in the last 72 hours. CBG: No results for input(s): GLUCAP in the last 168 hours. Lipid Profile: No results for input(s): CHOL, HDL, LDLCALC, TRIG, CHOLHDL, LDLDIRECT in the last 72 hours. Thyroid Function Tests: No results for input(s): TSH, T4TOTAL, FREET4, T3FREE, THYROIDAB in the last 72 hours. Anemia Panel: No results for input(s): VITAMINB12, FOLATE, FERRITIN, TIBC, IRON, RETICCTPCT in the last 72 hours. Sepsis Labs: Recent Labs  Lab 02/21/18 1716  LATICACIDVEN 0.85    No results found for this or any previous visit (from the past 240 hour(s)).   Radiology Studies: Dg Chest 2 View  Result Date: 02/21/2018 CLINICAL DATA:  72 year old female with history of shortness of breath since yesterday. No chest pain or tightness. EXAM: CHEST - 2 VIEW COMPARISON:  Chest x-ray 10/09/2017. FINDINGS: Lung volumes are normal. Ill-defined airspace disease in the left lower lobe obscuring the left hemidiaphragm concerning for bronchopneumonia. Right lung is clear. No pleural effusions. No evidence of pulmonary edema. Heart size is mildly enlarged. Upper mediastinal contours are within normal limits. Aortic atherosclerosis. IMPRESSION: 1. Findings suggest left lower lobe bronchopneumonia. Followup PA and lateral chest X-ray is recommended in 3-4 weeks following trial of antibiotic therapy to ensure resolution and exclude underlying malignancy. 2. Mild cardiomegaly. 3. Aortic atherosclerosis. Electronically Signed   By: Vinnie Langton M.D.   On: 02/21/2018 16:30   Scheduled Meds: . ipratropium-albuterol  3 mL Nebulization TID  . metoprolol succinate  12.5 mg Oral Daily   Continuous Infusions: . azithromycin    . cefTRIAXone (ROCEPHIN)  IV      LOS: 0 days   Kerney Elbe, DO [facility] Pager [phone]  If 7PM-7AM, please contact night-coverage [email or web address] Password [email or web address] 02/22/2018, 8:10 AM

## 2018-02-22 NOTE — Evaluation (Signed)
Physical Therapy Evaluation Patient Details Name: Jennifer Foster MRN: [id number] DOB: [birth year] Today's Date: 02/22/2018   History of Present Illness  72 yo female admiotted with hypoxia, shortness of breath, pneumonia.  Clinical Impression  The patient  Requires no assistance. Ambulated on RA x 220' with O2 saturation > 96%. HR 76. BP 185/70. No further PT needs. Encouraged pursed lip breathing when SOB.     Follow Up Recommendations No PT follow up    Equipment Recommendations  None recommended by PT    Recommendations for Other Services       Precautions / Restrictions Precautions Precaution Comments: monitored sats      Mobility  Bed Mobility Overal bed mobility: Independent                Transfers Overall transfer level: Independent                  Ambulation/Gait Ambulation/Gait assistance: Independent Gait Distance (Feet): 220 Feet Assistive device: None Gait Pattern/deviations: WFL(Within Functional Limits)        Stairs            Wheelchair Mobility    Modified Rankin (Stroke Patients Only)       Balance                                             Pertinent Vitals/Pain Pain Assessment: No/denies pain    Home Living Family/patient expects to be discharged to:: Private residence Living Arrangements: Spouse/significant other Available Help at Discharge: Family Type of Home: House Home Access: Level entry     Home Layout: One level Home Equipment: None      Prior Function Level of Independence: Independent               Hand Dominance        Extremity/Trunk Assessment        Lower Extremity Assessment Lower Extremity Assessment: Overall WFL for tasks assessed    Cervical / Trunk Assessment Cervical / Trunk Assessment: Normal  Communication   Communication: No difficulties  Cognition Arousal/Alertness: Awake/alert Behavior During Therapy: WFL for tasks  assessed/performed Overall Cognitive Status: Within Functional Limits for tasks assessed                                        General Comments      Exercises     Assessment/Plan    PT Assessment Patent does not need any further PT services  PT Problem List         PT Treatment Interventions      PT Goals (Current goals can be found in the Care Plan section)  Acute Rehab PT Goals Patient Stated Goal: to go home PT Goal Formulation: All assessment and education complete, DC therapy    Frequency     Barriers to discharge        Co-evaluation               AM-PAC PT "6 Clicks" Daily Activity  Outcome Measure Difficulty turning over in bed (including adjusting bedclothes, sheets and blankets)?: None Difficulty moving from lying on back to sitting on the side of the bed? : None Difficulty sitting down on and standing up from a chair with arms (e.g., wheelchair, bedside  commode, etc,.)?: None Help needed moving to and from a bed to chair (including a wheelchair)?: None Help needed walking in hospital room?: None Help needed climbing 3-5 steps with a railing? : None 6 Click Score: 24    End of Session   Activity Tolerance: Patient tolerated treatment well Patient left: in bed;with call bell/phone within reach Nurse Communication: Mobility status PT Visit Diagnosis: Other (comment)    Time: 7618-4859 PT Time Calculation (min) (ACUTE ONLY): 17 min   Charges:   PT Evaluation $PT Eval Low Complexity: 1 Low     PT G CodesTresa Endo PT [phone]   Claretha Cooper 02/22/2018, 1:11 PM

## 2018-02-22 NOTE — Evaluation (Signed)
Occupational Therapy Evaluation Patient Details Name: Jennifer Foster MRN: [id number] DOB: 12-10-1945 Today's Date: 02/22/2018    History of Present Illness 72 yo female admitted with hypoxia, shortness of breath, pneumonia.   Clinical Impression   Pt admitted with SOB. Pt currently with functional limitations due to the deficits listed below (see OT Problem List).  Pt will benefit from skilled OT to increase their safety and independence with ADL and functional mobility for ADL to facilitate discharge to venue listed below.      Follow Up Recommendations  No OT follow up    Equipment Recommendations  3 in 1 bedside commode       Precautions / Restrictions Precautions Precaution Comments: monitored sats      Mobility Bed Mobility Overal bed mobility: Independent                Transfers Overall transfer level: Independent                        ADL either performed or assessed with clinical judgement   ADL Overall ADL's : Needs assistance/impaired Eating/Feeding: Independent;Sitting   Grooming: Supervision/safety;Standing   Upper Body Bathing: Set up;Sitting   Lower Body Bathing: Minimal assistance;Sit to/from stand;Cueing for sequencing;Cueing for safety   Upper Body Dressing : Set up;Sitting   Lower Body Dressing: Minimal assistance;Sit to/from stand;Cueing for sequencing   Toilet Transfer: Min guard;Comfort height toilet             General ADL Comments: Educated pt on energy conservation strategies with ADL activty . Handout provided. Pt very interested and felt this to be beneficial     Vision Patient Visual Report: No change from baseline              Pertinent Vitals/Pain Pain Assessment: No/denies pain     Hand Dominance     Extremity/Trunk Assessment     Lower Extremity Assessment Lower Extremity Assessment: Overall WFL for tasks assessed   Cervical / Trunk Assessment Cervical / Trunk Assessment: Normal    Communication Communication Communication: No difficulties   Cognition Arousal/Alertness: Awake/alert Behavior During Therapy: WFL for tasks assessed/performed Overall Cognitive Status: Within Functional Limits for tasks assessed                                                Home Living Family/patient expects to be discharged to:: Private residence Living Arrangements: Spouse/significant other Available Help at Discharge: Family Type of Home: House Home Access: Level entry     Home Layout: One level     Bathroom Shower/Tub: Aeronautical engineer: None          Prior Functioning/Environment Level of Independence: Independent                 OT Problem List: Decreased strength;Decreased activity tolerance;Decreased knowledge of use of DME or AE;Cardiopulmonary status limiting activity      OT Treatment/Interventions: Self-care/ADL training;Patient/family education;Energy conservation    OT Goals(Current goals can be found in the care plan section) Acute Rehab OT Goals Patient Stated Goal: to go home OT Goal Formulation: With patient Time For Goal Achievement: 03/01/18 ADL Goals Pt Will Perform Lower Body Dressing: with modified independence;sit to/from stand Pt Will Transfer to Toilet: with modified independence;ambulating Pt Will Perform Toileting -  Clothing Manipulation and hygiene: with modified independence;sit to/from stand Pt Will Perform Tub/Shower Transfer: with modified independence;grab bars;ambulating;shower seat Additional ADL Goal #1: Pt will verbalize and demonstrate energy conservation strategie with ADL activity with handout provided at mod I level  OT Frequency: Min 2X/week   Barriers to D/C:               AM-PAC PT "6 Clicks" Daily Activity     Outcome Measure Help from another person eating meals?: None Help from another person taking care of personal grooming?: A Little Help from another person  toileting, which includes using toliet, bedpan, or urinal?: A Little Help from another person bathing (including washing, rinsing, drying)?: A Little Help from another person to put on and taking off regular upper body clothing?: None Help from another person to put on and taking off regular lower body clothing?: A Little 6 Click Score: 20   End of Session Nurse Communication: Mobility status  Activity Tolerance: Patient tolerated treatment well Patient left: in bed;with call bell/phone within reach  OT Visit Diagnosis: Unsteadiness on feet (R26.81);Other abnormalities of gait and mobility (R26.89);Muscle weakness (generalized) (M62.81);History of falling (Z91.81)                Time: 1349-1410 OT Time Calculation (min): 21 min Charges:  OT General Charges $OT Visit: 1 Visit OT Evaluation $OT Eval Moderate Complexity: 1 Mod G-Codes:     Kari Baars, [employer]  Payton Mccallum D 02/22/2018, 2:28 PM

## 2018-02-23 ENCOUNTER — Observation Stay (HOSPITAL_COMMUNITY): Payer: Medicare HMO

## 2018-02-23 DIAGNOSIS — J9601 Acute respiratory failure with hypoxia: Secondary | ICD-10-CM | POA: Diagnosis not present

## 2018-02-23 DIAGNOSIS — F419 Anxiety disorder, unspecified: Secondary | ICD-10-CM | POA: Diagnosis not present

## 2018-02-23 DIAGNOSIS — I712 Thoracic aortic aneurysm, without rupture: Secondary | ICD-10-CM | POA: Diagnosis not present

## 2018-02-23 DIAGNOSIS — J181 Lobar pneumonia, unspecified organism: Secondary | ICD-10-CM | POA: Diagnosis not present

## 2018-02-23 LAB — CBC WITH DIFFERENTIAL/PLATELET
BASOS ABS: 0 10*3/uL (ref 0.0–0.1)
BASOS PCT: 0 %
EOS ABS: 0.2 10*3/uL (ref 0.0–0.7)
Eosinophils Relative: 3 %
HEMATOCRIT: 29.6 % — AB (ref 36.0–46.0)
Hemoglobin: 10 g/dL — ABNORMAL LOW (ref 12.0–15.0)
Lymphocytes Relative: 18 %
Lymphs Abs: 1.2 10*3/uL (ref 0.7–4.0)
MCH: 28.9 pg (ref 26.0–34.0)
MCHC: 33.8 g/dL (ref 30.0–36.0)
MCV: 85.5 fL (ref 78.0–100.0)
MONO ABS: 0.9 10*3/uL (ref 0.1–1.0)
MONOS PCT: 13 %
Neutro Abs: 4.4 10*3/uL (ref 1.7–7.7)
Neutrophils Relative %: 66 %
PLATELETS: 166 10*3/uL (ref 150–400)
RBC: 3.46 MIL/uL — ABNORMAL LOW (ref 3.87–5.11)
RDW: 13.5 % (ref 11.5–15.5)
WBC: 6.5 10*3/uL (ref 4.0–10.5)

## 2018-02-23 LAB — COMPREHENSIVE METABOLIC PANEL
ALT: 9 U/L (ref 0–44)
ANION GAP: 6 (ref 5–15)
AST: 14 U/L — AB (ref 15–41)
Albumin: 2.6 g/dL — ABNORMAL LOW (ref 3.5–5.0)
Alkaline Phosphatase: 53 U/L (ref 38–126)
BILIRUBIN TOTAL: 0.5 mg/dL (ref 0.3–1.2)
BUN: 15 mg/dL (ref 8–23)
CHLORIDE: 115 mmol/L — AB (ref 98–111)
CO2: 22 mmol/L (ref 22–32)
Calcium: 8.4 mg/dL — ABNORMAL LOW (ref 8.9–10.3)
Creatinine, Ser: 1.61 mg/dL — ABNORMAL HIGH (ref 0.44–1.00)
GFR calc Af Amer: 37 mL/min — ABNORMAL LOW (ref >60)
GFR calc non Af Amer: 32 mL/min — ABNORMAL LOW (ref >60)
Glucose, Bld: 102 mg/dL — ABNORMAL HIGH (ref 70–99)
Potassium: 4.1 mmol/L (ref 3.5–5.1)
SODIUM: 143 mmol/L (ref 135–145)
Total Protein: 6 g/dL — ABNORMAL LOW (ref 6.5–8.1)

## 2018-02-23 LAB — MAGNESIUM: Magnesium: 1.8 mg/dL (ref 1.7–2.4)

## 2018-02-23 LAB — RESPIRATORY PANEL BY PCR
Adenovirus: NOT DETECTED
BORDETELLA PERTUSSIS-RVPCR: NOT DETECTED
CORONAVIRUS OC43-RVPPCR: NOT DETECTED
Chlamydophila pneumoniae: NOT DETECTED
Coronavirus 229E: NOT DETECTED
Coronavirus HKU1: NOT DETECTED
Coronavirus NL63: NOT DETECTED
Influenza A: NOT DETECTED
Influenza B: NOT DETECTED
METAPNEUMOVIRUS-RVPPCR: NOT DETECTED
Mycoplasma pneumoniae: NOT DETECTED
PARAINFLUENZA VIRUS 1-RVPPCR: NOT DETECTED
PARAINFLUENZA VIRUS 2-RVPPCR: NOT DETECTED
PARAINFLUENZA VIRUS 3-RVPPCR: NOT DETECTED
Parainfluenza Virus 4: NOT DETECTED
RESPIRATORY SYNCYTIAL VIRUS-RVPPCR: NOT DETECTED
RHINOVIRUS / ENTEROVIRUS - RVPPCR: NOT DETECTED

## 2018-02-23 LAB — FOLATE: Folate: 13 ng/mL (ref >5.9)

## 2018-02-23 LAB — EXPECTORATED SPUTUM ASSESSMENT W GRAM STAIN, RFLX TO RESP C

## 2018-02-23 LAB — RETICULOCYTES
RBC.: 3.46 MIL/uL — AB (ref 3.87–5.11)
Retic Count, Absolute: 86.5 10*3/uL (ref 19.0–186.0)
Retic Ct Pct: 2.5 % (ref 0.4–3.1)

## 2018-02-23 LAB — IRON AND TIBC
IRON: 27 ug/dL — AB (ref 28–170)
SATURATION RATIOS: 11 % (ref 10.4–31.8)
TIBC: 255 ug/dL (ref 250–450)
UIBC: 228 ug/dL

## 2018-02-23 LAB — VITAMIN B12: VITAMIN B 12: 238 pg/mL (ref 180–914)

## 2018-02-23 LAB — FERRITIN: Ferritin: 48 ng/mL (ref 11–307)

## 2018-02-23 LAB — PHOSPHORUS: Phosphorus: 4.2 mg/dL (ref 2.5–4.6)

## 2018-02-23 LAB — EXPECTORATED SPUTUM ASSESSMENT W REFEX TO RESP CULTURE

## 2018-02-23 MED ORDER — IRBESARTAN 150 MG PO TABS
300.0000 mg | ORAL_TABLET | Freq: Every day | ORAL | Status: DC
  Administered 2018-02-23 – 2018-02-24 (×2): 300 mg via ORAL
  Filled 2018-02-23 (×2): qty 2

## 2018-02-23 MED ORDER — LABETALOL HCL 5 MG/ML IV SOLN
10.0000 mg | Freq: Once | INTRAVENOUS | Status: AC
  Administered 2018-02-23: 10 mg via INTRAVENOUS
  Filled 2018-02-23: qty 4

## 2018-02-23 NOTE — Progress Notes (Signed)
Occupational Therapy Treatment Patient Details Name: Jennifer Foster MRN: [id number] DOB: 01-09-1946 Today's Date: 02/23/2018    History of present illness 72 yo female admiotted with hypoxia, shortness of breath, pneumonia.      Follow Up Recommendations  No OT follow up    Equipment Recommendations  3 in 1 bedside commode    Recommendations for Other Services      Precautions / Restrictions Precautions Precaution Comments: monitored sats       Mobility Bed Mobility Overal bed mobility: Independent                Transfers Overall transfer level: Needs assistance   Transfers: Sit to/from Stand;Stand Pivot Transfers Sit to Stand: Supervision Stand pivot transfers: Supervision                ADL either performed or assessed with clinical judgement   ADL Overall ADL's : Needs assistance/impaired                         Toilet Transfer: Supervision/safety;Ambulation;Cueing for sequencing;Cueing for safety;Comfort height toilet   Toileting- Clothing Manipulation and Hygiene: Supervision/safety;Sit to/from stand   Tub/ Banker: Walk-in shower;Ambulation;Supervision/safety   Functional mobility during ADLs: Supervision/safety;Cueing for safety General ADL Comments: reinforced energy conservation strategies with pt.                 Cognition Arousal/Alertness: Awake/alert Behavior During Therapy: WFL for tasks assessed/performed Overall Cognitive Status: Within Functional Limits for tasks assessed                                                     Pertinent Vitals/ Pain       Pain Assessment: No/denies pain     Prior Functioning/Environment              Frequency  Min 2X/week        Progress Toward Goals  OT Goals(current goals can now be found in the care plan section)  Progress towards OT goals: Progressing toward goals     Plan         AM-PAC PT "6 Clicks" Daily Activity     Outcome  Measure   Help from another person eating meals?: None Help from another person taking care of personal grooming?: A Little Help from another person toileting, which includes using toliet, bedpan, or urinal?: A Little Help from another person bathing (including washing, rinsing, drying)?: A Little Help from another person to put on and taking off regular upper body clothing?: None Help from another person to put on and taking off regular lower body clothing?: A Little 6 Click Score: 20    End of Session    OT Visit Diagnosis: Unsteadiness on feet (R26.81);Other abnormalities of gait and mobility (R26.89);Muscle weakness (generalized) (M62.81);History of falling (Z91.81)   Activity Tolerance Patient tolerated treatment well   Patient Left in bed;with call bell/phone within reach   Nurse Communication Mobility status        Time: 9629-5284 OT Time Calculation (min): 11 min  Charges: OT General Charges $OT Visit: 1 Visit OT Treatments $Self Care/Home Management : 8-22 mins  [place], [place]   Betsy Pries 02/23/2018, 11:23 AM

## 2018-02-23 NOTE — Progress Notes (Signed)
SATURATION QUALIFICATIONS: (This note is used to comply with regulatory documentation for home oxygen)  Patient Saturations on Room Air at Rest = 95%  Patient Saturations on Room Air while Ambulating = 95%  Patient Saturations on 0 Liters of oxygen while Ambulating = 95%  Please briefly explain why patient needs home oxygen: 

## 2018-02-23 NOTE — Progress Notes (Signed)
PROGRESS NOTE    Jennifer Foster  EVO:[id number] DOB: May 13, 1946 DOA: 02/21/2018 PCP: [name], MD   Brief Narrative:  Jennifer Foster is a 72 y.o. female with medical history significant of A. fib, hypertension, asthma, and other comorbids comes in with over 2 days of progressive worsening shortness of breath and productive cough.  She denies any recent antibiotics or recent hospitalizations. Patient be referred for admission for pneumonia with mild hypoxia of 88% on room air with ambulation and she does not use home supplemental oxygen. Started on Empiric Abx and Breathing Treatments.   Assessment & Plan:   Principal Problem:   CAP (community acquired pneumonia) Active Problems:   Generalized anxiety disorder   Essential hypertension   Cocaine abuse ([place])   Chronic kidney disease, stage 3 (HCC)   Anxiety and depression   Aneurysm of thoracic aorta (HCC)   Atrial fibrillation (HCC)   Acute respiratory failure with hypoxia (HCC)  Acute Respiratory Failure with Hypoxia 2/2 to Left Lower Lobe PNA and now New Right Basilar Infiltrate, improving  -Noted to be hypoxic on room air with ambulation and dropped to 88% -Continue with supplemental oxygen via nasal cannula and wean oxygen as tolerated; Now weaned off of O2 -Continuous pulse oximetry and maintain O2 saturation greater than 92% -Chest x-ray on admission showed findings suggestive left lower lobe bronchopneumonia and now has a Right basilar Infiltrate as well -Continue with flutter valve, incentive spirometer, guaifenesin 1200 mg p.o. twice daily -Continue to monitor respiratory status and repeat chest x-ray in the a.m. -Home Ambulatory screen done and patient did not need supplemental O2  Left and Right Lower Lobe PNA -As above -Continue with empiric antibiotics with IV azithromycin IV ceftriaxone -Started on duo nebs but will change to ipratropium/levalbuterol every 6 scheduled given her history of atrial  fibrillation -Added albuterol 2.5 mg q. 4 PRN for wheezing and shortness of breath -Checking respiratory virus panel and placing on empiric droplet precautions; Respiratory Virus Panel Negative so will remove Droplet  -Strep pneumo urinary antigen is negative; order urine Legionella antigen is pending -Blood cultures x2  showed NGTD at 1 day  -We will try to obtain sputum culture -Continue with incentive spirometer, flutter valve, and guaifenesin 200 g p.o. twice daily  HTN -BP was severely elevated at 197/76 -Continue with metoprolol succinate 12.5 mg p.o. Daily.  We will currently Held patient's Irbesartan 300 mg p.o. Daily but will resume now  -Start the patient on IV hydralazine 10 mg every 6 PRN for systolic blood pressure greater than 299 or diastolic blood pressure in the 100 -Because Blood Pressure was so elevated gave 1x dose of IV Labetalol   HLD -Continue with Rosuvastatin 20 mg p.o. daily  GERD  -Currently not on any medications per [facility]  Gout -Currently not on any medications per MAR  AKI on CKD Stage 3 -Patient's BUN/creatinine went from 18/1.69 and went 14/1.50 and is now 15/1.61 ; Previous baseline rangng from 1.2-1.4 -Avoid nephrotoxic medications if possible and currently Held Irbesartan 300 mg p.o. Daily for now but will resume  -IV fluid hydration with normal saline at rate of 75 mL/hr for 24 hours now Discontinued  -Continue monitor and repeat CMP in a.m.  Atrial Fibrillation -Continue with Metoprolol Succinate 12.5 mg p.o. Daily -Continue with Apixaban 5 mg p.o. twice daily -He is rate controlled and is not on telemetry   Normocytic Anemia -Likely in setting of chronic kidney disease -Patient's hemoglobin/hematocrit went from 10.5/30.4 and  is now 10.0/29.6 -Anemia panel in the a.m. -Continue to monitor for signs and symptoms of bleeding -Repeat CBC in the a.m.  Generalize Anxiety/Depression -Stable  Hx of Aneurysm of Thoracic Aorta  -Stable. CT Scan  showed Atherosclerotic and mildly aneurysmal ascending thoracic aorta with a maximal diameter of 4.1 in 04/03/17 -Follow up Imaging as an outpatient with annual imaging followup by CTA or MRA as an outpatient   DVT prophylaxis: Anticoagulated with Apixaban 5 mg po BID  Code Status: FULL CODE  Family Communication: No family present at bedside  Disposition Plan: Anticipate D/C Home in next 24-48 hours if medically stable  Consultants:   None   Procedures: None   Antimicrobials:  Anti-infectives (From admission, onward)   Start     Dose/Rate Route Frequency Ordered Stop   02/22/18 1800  cefTRIAXone (ROCEPHIN) 1 g in sodium chloride 0.9 % 100 mL IVPB     1 g 200 mL/hr over 30 Minutes Intravenous Every 24 hours 02/21/18 1858 02/28/18 1759   02/22/18 1700  azithromycin (ZITHROMAX) 500 mg in sodium chloride 0.9 % 250 mL IVPB     500 mg 250 mL/hr over 60 Minutes Intravenous Every 24 hours 02/21/18 1858 02/28/18 1659   02/21/18 1645  azithromycin (ZITHROMAX) 500 mg in sodium chloride 0.9 % 250 mL IVPB  Status:  Discontinued     500 mg 250 mL/hr over 60 Minutes Intravenous Every 24 hours 02/21/18 1642 02/22/18 0738   02/21/18 1645  cefTRIAXone (ROCEPHIN) 1 g in sodium chloride 0.9 % 100 mL IVPB  Status:  Discontinued     1 g 200 mL/hr over 30 Minutes Intravenous Every 24 hours 02/21/18 1642 02/22/18 0738     Subjective: Seen and examined at bedside and states she is coughing up some sputum.  States shortness of breath is improved and was concerned that she had a new right-sided infiltrate.  No lightheadedness or dizziness.  States that the only place that she has been around people was when she went to church and wonders if she got sick from them.  No other complaints or concerns at this time he denies any chest pain.  Objective: Vitals:   02/22/18 1953 02/22/18 2205 02/23/18 0523 02/23/18 0633  BP: (!) 168/118 (!) 189/76 (!) 172/60   Pulse: 83 79 67   Resp: 19  20   Temp: 98.1 F  (36.7 C)  98.5 F (36.9 C)   TempSrc:      SpO2: 98% 99% 98% 94%  Weight:        Intake/Output Summary (Last 24 hours) at 02/23/2018 0816 Last data filed at 02/22/2018 1813 Gross per 24 hour  Intake 718.75 ml  Output -  Net 718.75 ml   Filed Weights   02/22/18 0827  Weight: 78.9 kg (174 lb)    Examination: Physical Exam:  Constitutional: Well-nourished, well-developed African-American female is currently slightly upset at the news of her having a new right-sided infiltrate.  She is slightly tearful now Eyes: Lids and conjunctive are normal.  Sclera anicteric ENMT: External ears and nose appear normal.  Has poor dentition Neck: Appears supple with no JVD Respiratory: Diminished to auscultation bilaterally with left-sided rhonchi more than right.  Had a normal respiratory effort and she is not tachypneic.  Supplemental oxygen via nasal cannula has been weaned Cardiovascular: Irregularly irregular.  No appreciable murmurs, rubs, gallops.  No lower examinee edema noted Abdomen: Soft, nontender, slightly distended.  Bowel sounds present in all 4 quadrants GU: Deferred Musculoskeletal:  No contractures or cyanosis.  No joint deformities Skin:  Skin is warm and dry with no appreciable rashes or lesions on limited skin evaluation Neurologic: Cranial nerves II through XII grossly intact with no appreciable focal deficits Psychiatric: Tearful and anxious anxious mood and affect.  Intact judgment and insight.  Patient is awake, alert, and oriented x3  Data Reviewed: I have personally reviewed following labs and imaging studies  CBC: Recent Labs  Lab 02/21/18 1705 02/22/18 0527 02/23/18 0612  WBC 5.7 6.5 6.5  NEUTROABS 3.6 4.6 4.4  HGB 10.5* 10.0* 10.0*  HCT 30.4* 29.6* 29.6*  MCV 84.4 83.9 85.5  PLT 167 163 062   Basic Metabolic Panel: Recent Labs  Lab 02/21/18 1705 02/22/18 0527 02/23/18 0612  NA 145 144 143  K 3.8 3.6 4.1  CL 110 113* 115*  CO2 27 24 22   GLUCOSE 107*  108* 102*  BUN 18 14 15   CREATININE 1.69* 1.50* 1.61*  CALCIUM 8.8* 8.4* 8.4*  MG  --   --  1.8  PHOS  --   --  4.2   GFR: Estimated Creatinine Clearance: 33.5 mL/min (A) (by C-G formula based on SCr of 1.61 mg/dL (H)). Liver Function Tests: Recent Labs  Lab 02/23/18 0612  AST 14*  ALT 9  ALKPHOS 53  BILITOT 0.5  PROT 6.0*  ALBUMIN 2.6*   No results for input(s): LIPASE, AMYLASE in the last 168 hours. No results for input(s): AMMONIA in the last 168 hours. Coagulation Profile: No results for input(s): INR, PROTIME in the last 168 hours. Cardiac Enzymes: No results for input(s): CKTOTAL, CKMB, CKMBINDEX, TROPONINI in the last 168 hours. BNP (last 3 results) No results for input(s): PROBNP in the last 8760 hours. HbA1C: No results for input(s): HGBA1C in the last 72 hours. CBG: No results for input(s): GLUCAP in the last 168 hours. Lipid Profile: No results for input(s): CHOL, HDL, LDLCALC, TRIG, CHOLHDL, LDLDIRECT in the last 72 hours. Thyroid Function Tests: No results for input(s): TSH, T4TOTAL, FREET4, T3FREE, THYROIDAB in the last 72 hours. Anemia Panel: Recent Labs    02/23/18 0612  VITAMINB12 238  FERRITIN 48  TIBC 255  IRON 27*  RETICCTPCT 2.5   Sepsis Labs: Recent Labs  Lab 02/21/18 1716  LATICACIDVEN 0.85    No results found for this or any previous visit (from the past 240 hour(s)).   Radiology Studies: Dg Chest 2 View  Result Date: 02/21/2018 CLINICAL DATA:  72 year old female with history of shortness of breath since yesterday. No chest pain or tightness. EXAM: CHEST - 2 VIEW COMPARISON:  Chest x-ray 10/09/2017. FINDINGS: Lung volumes are normal. Ill-defined airspace disease in the left lower lobe obscuring the left hemidiaphragm concerning for bronchopneumonia. Right lung is clear. No pleural effusions. No evidence of pulmonary edema. Heart size is mildly enlarged. Upper mediastinal contours are within normal limits. Aortic atherosclerosis.  IMPRESSION: 1. Findings suggest left lower lobe bronchopneumonia. Followup PA and lateral chest X-ray is recommended in 3-4 weeks following trial of antibiotic therapy to ensure resolution and exclude underlying malignancy. 2. Mild cardiomegaly. 3. Aortic atherosclerosis. Electronically Signed   By: Vinnie Langton M.D.   On: 02/21/2018 16:30   Dg Chest Port 1 View  Result Date: 02/23/2018 CLINICAL DATA:  Shortness of breath EXAM: PORTABLE CHEST 1 VIEW COMPARISON:  02/21/2018 FINDINGS: Cardiac shadow is enlarged. The lungs are well aerated bilaterally. Small right pleural effusion is noted with increasing right basilar infiltrate. Left lung shows some improvement in aeration  in the base. IMPRESSION: New right basilar infiltrate with associated effusion. Electronically Signed   By: Inez Catalina M.D.   On: 02/23/2018 08:00   Scheduled Meds: . apixaban  5 mg Oral BID  . guaiFENesin  1,200 mg Oral BID  . ipratropium  0.5 mg Nebulization TID  . levalbuterol  0.63 mg Nebulization TID  . metoprolol succinate  12.5 mg Oral Daily  . rosuvastatin  20 mg Oral q1800   Continuous Infusions: . azithromycin Stopped (02/22/18 1656)  . cefTRIAXone (ROCEPHIN)  IV Stopped (02/22/18 1812)    LOS: 0 days   Kerney Elbe, DO [facility] Pager [phone]  If 7PM-7AM, please contact night-coverage [email or web address] Password [email or web address] 02/23/2018, 8:16 AM

## 2018-02-24 ENCOUNTER — Observation Stay (HOSPITAL_COMMUNITY): Payer: Medicare HMO

## 2018-02-24 ENCOUNTER — Other Ambulatory Visit: Payer: Self-pay | Admitting: Internal Medicine

## 2018-02-24 DIAGNOSIS — N183 Chronic kidney disease, stage 3 (moderate): Secondary | ICD-10-CM | POA: Diagnosis not present

## 2018-02-24 DIAGNOSIS — J181 Lobar pneumonia, unspecified organism: Secondary | ICD-10-CM | POA: Diagnosis not present

## 2018-02-24 DIAGNOSIS — I480 Paroxysmal atrial fibrillation: Secondary | ICD-10-CM | POA: Diagnosis not present

## 2018-02-24 DIAGNOSIS — J9601 Acute respiratory failure with hypoxia: Secondary | ICD-10-CM | POA: Diagnosis not present

## 2018-02-24 DIAGNOSIS — F419 Anxiety disorder, unspecified: Secondary | ICD-10-CM | POA: Diagnosis not present

## 2018-02-24 DIAGNOSIS — F329 Major depressive disorder, single episode, unspecified: Secondary | ICD-10-CM | POA: Diagnosis not present

## 2018-02-24 LAB — COMPREHENSIVE METABOLIC PANEL
ALT: 9 U/L (ref 0–44)
AST: 14 U/L — ABNORMAL LOW (ref 15–41)
Albumin: 2.7 g/dL — ABNORMAL LOW (ref 3.5–5.0)
Alkaline Phosphatase: 50 U/L (ref 38–126)
Anion gap: 7 (ref 5–15)
BUN: 14 mg/dL (ref 8–23)
CHLORIDE: 114 mmol/L — AB (ref 98–111)
CO2: 23 mmol/L (ref 22–32)
Calcium: 8.7 mg/dL — ABNORMAL LOW (ref 8.9–10.3)
Creatinine, Ser: 1.61 mg/dL — ABNORMAL HIGH (ref 0.44–1.00)
GFR calc Af Amer: 37 mL/min — ABNORMAL LOW (ref >60)
GFR calc non Af Amer: 32 mL/min — ABNORMAL LOW (ref >60)
Glucose, Bld: 96 mg/dL (ref 70–99)
POTASSIUM: 3.8 mmol/L (ref 3.5–5.1)
SODIUM: 144 mmol/L (ref 135–145)
Total Bilirubin: 0.8 mg/dL (ref 0.3–1.2)
Total Protein: 6.1 g/dL — ABNORMAL LOW (ref 6.5–8.1)

## 2018-02-24 LAB — CBC WITH DIFFERENTIAL/PLATELET
Basophils Absolute: 0 10*3/uL (ref 0.0–0.1)
Basophils Relative: 0 %
EOS ABS: 0.2 10*3/uL (ref 0.0–0.7)
Eosinophils Relative: 3 %
HEMATOCRIT: 27.7 % — AB (ref 36.0–46.0)
HEMOGLOBIN: 9.4 g/dL — AB (ref 12.0–15.0)
LYMPHS ABS: 1.2 10*3/uL (ref 0.7–4.0)
Lymphocytes Relative: 21 %
MCH: 28.8 pg (ref 26.0–34.0)
MCHC: 33.9 g/dL (ref 30.0–36.0)
MCV: 85 fL (ref 78.0–100.0)
MONOS PCT: 12 %
Monocytes Absolute: 0.7 10*3/uL (ref 0.1–1.0)
Neutro Abs: 3.9 10*3/uL (ref 1.7–7.7)
Neutrophils Relative %: 64 %
Platelets: 151 10*3/uL (ref 150–400)
RBC: 3.26 MIL/uL — ABNORMAL LOW (ref 3.87–5.11)
RDW: 13.5 % (ref 11.5–15.5)
WBC: 6.1 10*3/uL (ref 4.0–10.5)

## 2018-02-24 LAB — PHOSPHORUS: Phosphorus: 4.1 mg/dL (ref 2.5–4.6)

## 2018-02-24 LAB — LEGIONELLA PNEUMOPHILA SEROGP 1 UR AG: L. PNEUMOPHILA SEROGP 1 UR AG: NEGATIVE

## 2018-02-24 LAB — MAGNESIUM: Magnesium: 1.7 mg/dL (ref 1.7–2.4)

## 2018-02-24 MED ORDER — METOPROLOL SUCCINATE ER 25 MG PO TB24: 25.0000 mg | ORAL_TABLET | Freq: Every day | ORAL | 0 refills | Status: DC

## 2018-02-24 MED ORDER — ALBUTEROL SULFATE HFA 108 (90 BASE) MCG/ACT IN AERS: 2.0000 | INHALATION_SPRAY | Freq: Four times a day (QID) | RESPIRATORY_TRACT | 2 refills | Status: DC | PRN

## 2018-02-24 MED ORDER — METOPROLOL SUCCINATE ER 25 MG PO TB24
25.0000 mg | ORAL_TABLET | Freq: Every day | ORAL | Status: DC
  Administered 2018-02-24: 25 mg via ORAL
  Filled 2018-02-24: qty 1

## 2018-02-24 MED ORDER — GUAIFENESIN ER 600 MG PO TB12: 1200.0000 mg | ORAL_TABLET | Freq: Two times a day (BID) | ORAL | 0 refills | Status: DC

## 2018-02-24 MED ORDER — CEFUROXIME AXETIL 500 MG PO TABS: 500.0000 mg | ORAL_TABLET | Freq: Two times a day (BID) | ORAL | 0 refills | Status: AC

## 2018-02-24 MED ORDER — HYDROCODONE-ACETAMINOPHEN 5-325 MG PO TABS
2.0000 | ORAL_TABLET | Freq: Once | ORAL | Status: AC
  Administered 2018-02-24: 2 via ORAL
  Filled 2018-02-24: qty 2

## 2018-02-24 MED ORDER — AZITHROMYCIN 500 MG PO TABS: 500.0000 mg | ORAL_TABLET | Freq: Every day | ORAL | 0 refills | Status: AC

## 2018-02-24 NOTE — Discharge Summary (Signed)
Physician Discharge Summary  Jennifer Foster RXV:[id number] DOB: 01/04/1946 DOA: 02/21/2018  PCP: [name], MD  Admit date: 02/21/2018 Discharge date: 02/24/2018  Admitted From: home  Disposition:  home   Recommendations for Outpatient Follow-up:  1. Repeat CXR in 2 wks  Home Health:  none  Equipment/Devices:  none    Discharge Condition:  stable   CODE STATUS:  Full code   Consultations:  none    Discharge Diagnoses:  Principal Problem:   CAP (community acquired pneumonia) Active Problems:   Generalized anxiety disorder   Essential hypertension   Cocaine abuse (HCC)   Chronic kidney disease, stage 3 (HCC)   Anxiety and depression   Aneurysm of thoracic aorta (HCC)   Atrial fibrillation ([place])   Acute respiratory failure with hypoxia ([place])      Brief Summary: Jennifer Needle Tysonis a 72 y.o.femalewith medical history significant ofA. fib, hypertension, asthma, and other comorbids comes in with over 2 days of progressive worsening shortness of breath and productive cough. She denies any recent antibiotics or recent hospitalizations. Patient be referred for admission for pneumonia with mild hypoxia of 88% on room air with ambulation and she does not use home supplemental oxygen. Started on Empiric Abx and Breathing Treatments.     Hospital Course:  Community acquired pneumonia with acute respiratory failure - initial CXR suggestive of LLL pneumonia but repeat CXR also showed and infiltrate in RLL- today, there are mild crackles in the RLL - the patient states she feels much better and has no dyspnea- cough is minimal - treated with Rocephin and Azithromycin - will give her 7 days of treatment - she initially required O2 but has been weaned off - also treated with Neb treatments and Guaifenesin  Cocaine abuse in the past - current UDS negative   HTN - does not check BP at home but BP has been persistently elevated in the hospital  - I have doubled her Toprol  and suggested she f/u with her PCP for further BP follow up - she needs to buy a BP monitor and start checking at home daily   CKD 3 - stable  A-fib - in NSR- cont Toprol with dose adjustment and continue Apixaban  Hx of Aneurysm of Thoracic Aorta  -Stable. CT Scan showed Atherosclerotic and mildly aneurysmal ascending thoracic aorta with a maximal diameter of 4.1 in 04/03/17   Discharge Exam: Vitals:   02/24/18 0655 02/24/18 0753  BP: (!) 160/76   Pulse: 86   Resp:    Temp:    SpO2:  93%   Vitals:   02/23/18 2242 02/24/18 0535 02/24/18 0655 02/24/18 0753  BP: (!) 156/78 (!) 191/73 (!) 160/76   Pulse: 80 75 86   Resp:  18    Temp:  98.1 F (36.7 C)    TempSrc:  Oral    SpO2:  91%  93%  Weight:        General: Pt is alert, awake, not in acute distress Cardiovascular: RRR, S1/S2 +, no rubs, no gallops Respiratory: crackles in RLL Abdominal: Soft, NT, ND, bowel sounds + Extremities: no edema, no cyanosis   Discharge Instructions  Discharge Instructions    Diet - low sodium heart healthy   Complete by:  As directed    Increase activity slowly   Complete by:  As directed      Allergies as of 02/24/2018      Reactions   Ace Inhibitors Swelling, Other (See Comments)   Eyes  swell   Lisinopril Swelling, Other (See Comments)   Swollen tongue    Tramadol Itching      Medication List    TAKE these medications   albuterol 108 (90 Base) MCG/ACT inhaler Commonly known as:  PROVENTIL HFA;VENTOLIN HFA Inhale 2 puffs into the lungs every 6 (six) hours as needed for wheezing or shortness of breath.   azithromycin 500 MG tablet Commonly known as:  ZITHROMAX Take 1 tablet (500 mg total) by mouth daily for 3 days. Take 1 tablet daily for 3 days.   Baclofen 5 MG Tabs Take 5 mg by mouth 2 (two) times daily as needed for muscle spasms.   cefUROXime 500 MG tablet Commonly known as:  CEFTIN Take 1 tablet (500 mg total) by mouth 2 (two) times daily for 14 days.   ELIQUIS  5 MG Tabs tablet Generic drug:  apixaban TAKE 1 TABLET BY MOUTH TWICE DAILY What changed:  additional instructions   guaiFENesin 600 MG 12 hr tablet Commonly known as:  MUCINEX Take 2 tablets (1,200 mg total) by mouth 2 (two) times daily.   irbesartan 300 MG tablet Commonly known as:  AVAPRO Take 1 tablet (300 mg total) by mouth daily.   metoprolol succinate 25 MG 24 hr tablet Commonly known as:  TOPROL-XL Take 1 tablet (25 mg total) by mouth daily. Start taking on:  02/25/2018 What changed:    how much to take  how to take this  when to take this  additional instructions   nicotine 7 mg/24hr patch Commonly known as:  NICODERM CQ - dosed in mg/24 hr PLACE 1 PATCH (7 MG TOTAL) ONTO THE SKIN DAILY   nitroGLYCERIN 0.4 MG SL tablet Commonly known as:  NITROSTAT Place 1 tablet (0.4 mg total) under the tongue every 5 (five) minutes as needed for chest pain. If do not resolve after 1 tablet, go to ED   rosuvastatin 20 MG tablet Commonly known as:  CRESTOR TAKE 1 TABLET BY MOUTH AT BEDTIME       Allergies  Allergen Reactions  . Ace Inhibitors Swelling and Other (See Comments)    Eyes swell  . Lisinopril Swelling and Other (See Comments)    Swollen tongue   . Tramadol Itching      ures/Studies:    Dg Chest 2 View  Result Date: 02/21/2018 CLINICAL DATA:  72 year old female with history of shortness of breath since yesterday. No chest pain or tightness. EXAM: CHEST - 2 VIEW COMPARISON:  Chest x-ray 10/09/2017. FINDINGS: Lung volumes are normal. Ill-defined airspace disease in the left lower lobe obscuring the left hemidiaphragm concerning for bronchopneumonia. Right lung is clear. No pleural effusions. No evidence of pulmonary edema. Heart size is mildly enlarged. Upper mediastinal contours are within normal limits. Aortic atherosclerosis. IMPRESSION: 1. Findings suggest left lower lobe bronchopneumonia. Followup PA and lateral chest X-ray is recommended in 3-4 weeks  following trial of antibiotic therapy to ensure resolution and exclude underlying malignancy. 2. Mild cardiomegaly. 3. Aortic atherosclerosis. Electronically Signed   By: Vinnie Langton M.D.   On: 02/21/2018 16:30   Dg Chest Port 1 View  Result Date: 02/24/2018 CLINICAL DATA:  Short of breath EXAM: PORTABLE CHEST 1 VIEW COMPARISON:  02/23/2018 FINDINGS: Heart is moderately enlarged. Scarring versus atelectasis at the left base. Heterogeneous opacities throughout the right lung, more pronounced at the base have improved. No pneumothorax. No definite pleural effusion. IMPRESSION: Improved airspace opacities throughout the right lung. Electronically Signed   By: Marybelle Killings  M.D.   On: 02/24/2018 06:51   Dg Chest Port 1 View  Result Date: 02/23/2018 CLINICAL DATA:  Shortness of breath EXAM: PORTABLE CHEST 1 VIEW COMPARISON:  02/21/2018 FINDINGS: Cardiac shadow is enlarged. The lungs are well aerated bilaterally. Small right pleural effusion is noted with increasing right basilar infiltrate. Left lung shows some improvement in aeration in the base. IMPRESSION: New right basilar infiltrate with associated effusion. Electronically Signed   By: Inez Catalina M.D.   On: 02/23/2018 08:00     The results of significant diagnostics from this hospitalization (including imaging, microbiology, ancillary and laboratory) are listed below for reference.     Microbiology: Recent Results (from the past 240 hour(s))  Culture, blood (Routine X 2) w Reflex to ID Panel     Status: None (Preliminary result)   Collection Time: 02/21/18  5:05 PM  Result Value Ref Range Status   Specimen Description   Final    BLOOD LEFT ANTECUBITAL Performed at [facility] [address] [place], [place] [address]    Special Requests   Final    BOTTLES DRAWN AEROBIC AND ANAEROBIC Blood Culture adequate volume Performed at [facility] [address] [place], [place] [address]    Culture    Final    NO GROWTH 1 DAY Performed at [facility], [place] [address] [place], [place] [address]    Report Status PENDING  Incomplete  Culture, blood (Routine X 2) w Reflex to ID Panel     Status: None (Preliminary result)   Collection Time: 02/21/18  5:12 PM  Result Value Ref Range Status   Specimen Description   Final    BLOOD LEFT WRIST Performed at [facility] [address], [place], [place] [address]    Special Requests   Final    BOTTLES DRAWN AEROBIC AND ANAEROBIC Blood Culture adequate volume Performed at [facility] [address] [place], [place] [address]    Culture   Final    NO GROWTH 1 DAY Performed at [facility], [place] [address], [place], [place] [address]    Report Status PENDING  Incomplete  Respiratory Panel by PCR     Status: None   Collection Time: 02/23/18 10:22 AM  Result Value Ref Range Status   Adenovirus NOT DETECTED NOT DETECTED Final   Coronavirus 229E NOT DETECTED NOT DETECTED Final   Coronavirus HKU1 NOT DETECTED NOT DETECTED Final   Coronavirus NL63 NOT DETECTED NOT DETECTED Final   Coronavirus OC43 NOT DETECTED NOT DETECTED Final   Metapneumovirus NOT DETECTED NOT DETECTED Final   Rhinovirus / Enterovirus NOT DETECTED NOT DETECTED Final   Influenza A NOT DETECTED NOT DETECTED Final   Influenza B NOT DETECTED NOT DETECTED Final   Parainfluenza Virus 1 NOT DETECTED NOT DETECTED Final   Parainfluenza Virus 2 NOT DETECTED NOT DETECTED Final   Parainfluenza Virus 3 NOT DETECTED NOT DETECTED Final   Parainfluenza Virus 4 NOT DETECTED NOT DETECTED Final   Respiratory Syncytial Virus NOT DETECTED NOT DETECTED Final   Bordetella pertussis NOT DETECTED NOT DETECTED Final   Chlamydophila pneumoniae NOT DETECTED NOT DETECTED Final   Mycoplasma pneumoniae NOT DETECTED NOT DETECTED Final    Comment: Performed at [facility], [place] [address] [place], [place] [address]  Culture, sputum-assessment      Status: None   Collection Time: 02/23/18  6:58 PM  Result Value Ref Range Status   Specimen  Description SPUTUM  Final   Special Requests NONE  Final   Sputum evaluation   Final    Sputum specimen not acceptable for testing.  Please recollect.   RESULT CALLED TO, READ BACK BY AND VERIFIED WITH: VICTORIA,RN @2203  02/23/18 MKELLY Performed at [facility], [place] [address] [place], [place] [address]    Report Status 02/23/2018 FINAL  Final     Labs: BNP (last 3 results) No results for input(s): BNP in the last 8760 hours. Basic Metabolic Panel: Recent Labs  Lab 02/21/18 1705 02/22/18 0527 02/23/18 0612 02/24/18 0620  NA 145 144 143 144  K 3.8 3.6 4.1 3.8  CL 110 113* 115* 114*  CO2 27 24 22 23   GLUCOSE 107* 108* 102* 96  BUN 18 14 15 14   CREATININE 1.69* 1.50* 1.61* 1.61*  CALCIUM 8.8* 8.4* 8.4* 8.7*  MG  --   --  1.8 1.7  PHOS  --   --  4.2 4.1   Liver Function Tests: Recent Labs  Lab 02/23/18 0612 02/24/18 0620  AST 14* 14*  ALT 9 9  ALKPHOS 53 50  BILITOT 0.5 0.8  PROT 6.0* 6.1*  ALBUMIN 2.6* 2.7*   No results for input(s): LIPASE, AMYLASE in the last 168 hours. No results for input(s): AMMONIA in the last 168 hours. CBC: Recent Labs  Lab 02/21/18 1705 02/22/18 0527 02/23/18 0612 02/24/18 0620  WBC 5.7 6.5 6.5 6.1  NEUTROABS 3.6 4.6 4.4 3.9  HGB 10.5* 10.0* 10.0* 9.4*  HCT 30.4* 29.6* 29.6* 27.7*  MCV 84.4 83.9 85.5 85.0  PLT 167 163 166 151   Cardiac Enzymes: No results for input(s): CKTOTAL, CKMB, CKMBINDEX, TROPONINI in the last 168 hours. BNP: Invalid input(s): POCBNP CBG: No results for input(s): GLUCAP in the last 168 hours. D-Dimer No results for input(s): DDIMER in the last 72 hours. Hgb A1c No results for input(s): HGBA1C in the last 72 hours. Lipid Profile No results for input(s): CHOL, HDL, LDLCALC, TRIG, CHOLHDL, LDLDIRECT in the last 72 hours. Thyroid function studies No results for input(s): TSH, T4TOTAL,  T3FREE, THYROIDAB in the last 72 hours.  Invalid input(s): FREET3 Anemia work up Recent Labs    02/23/18 0612  VITAMINB12 238  FOLATE 13.0  FERRITIN 48  TIBC 255  IRON 27*  RETICCTPCT 2.5   Urinalysis    Component Value Date/Time   COLORURINE YELLOW 02/21/2017 1238   APPEARANCEUR CLEAR 02/21/2017 1238   LABSPEC 1.009 02/21/2017 1238   PHURINE 5.0 02/21/2017 1238   GLUCOSEU NEGATIVE 02/21/2017 1238   HGBUR NEGATIVE 02/21/2017 1238   BILIRUBINUR NEGATIVE 02/21/2017 1238   [place] 03/18/2016 1054   KETONESUR NEGATIVE 02/21/2017 1238   PROTEINUR 100 (A) 02/21/2017 1238   UROBILINOGEN 0.2 03/18/2016 1054   UROBILINOGEN 0.2 06/25/2015 0920   NITRITE NEGATIVE 02/21/2017 1238   LEUKOCYTESUR NEGATIVE 02/21/2017 1238   Sepsis Labs Invalid input(s): PROCALCITONIN,  WBC,  LACTICIDVEN Microbiology Recent Results (from the past 240 hour(s))  Culture, blood (Routine X 2) w Reflex to ID Panel     Status: None (Preliminary result)   Collection Time: 02/21/18  5:05 PM  Result Value Ref Range Status   Specimen Description   Final    BLOOD LEFT ANTECUBITAL Performed at [facility], [place] [address] [place], [place] [address]    Special Requests   Final    BOTTLES DRAWN AEROBIC AND ANAEROBIC Blood Culture adequate volume Performed at [facility]  [address] [place], [place] [address]    Culture   Final    NO GROWTH 1 DAY Performed at [facility] [address], [place], [place] [address]    Report Status PENDING  Incomplete  Culture, blood (Routine X 2) w Reflex to ID Panel     Status: None (Preliminary result)   Collection Time: 02/21/18  5:12 PM  Result Value Ref Range Status   Specimen Description   Final    BLOOD LEFT WRIST Performed at [facility] [address], [place], [place] [address]    Special Requests   Final    BOTTLES DRAWN AEROBIC AND ANAEROBIC Blood Culture adequate  volume Performed at [facility] [address], [place], [place] [address]    Culture   Final    NO GROWTH 1 DAY Performed at [facility], [place] [address] [place], [place] [address]    Report Status PENDING  Incomplete  Respiratory Panel by PCR     Status: None   Collection Time: 02/23/18 10:22 AM  Result Value Ref Range Status   Adenovirus NOT DETECTED NOT DETECTED Final   Coronavirus 229E NOT DETECTED NOT DETECTED Final   Coronavirus HKU1 NOT DETECTED NOT DETECTED Final   Coronavirus NL63 NOT DETECTED NOT DETECTED Final   Coronavirus OC43 NOT DETECTED NOT DETECTED Final   Metapneumovirus NOT DETECTED NOT DETECTED Final   Rhinovirus / Enterovirus NOT DETECTED NOT DETECTED Final   Influenza A NOT DETECTED NOT DETECTED Final   Influenza B NOT DETECTED NOT DETECTED Final   Parainfluenza Virus 1 NOT DETECTED NOT DETECTED Final   Parainfluenza Virus 2 NOT DETECTED NOT DETECTED Final   Parainfluenza Virus 3 NOT DETECTED NOT DETECTED Final   Parainfluenza Virus 4 NOT DETECTED NOT DETECTED Final   Respiratory Syncytial Virus NOT DETECTED NOT DETECTED Final   Bordetella pertussis NOT DETECTED NOT DETECTED Final   Chlamydophila pneumoniae NOT DETECTED NOT DETECTED Final   Mycoplasma pneumoniae NOT DETECTED NOT DETECTED Final    Comment: Performed at [facility], [place] [address], [place], [place] [address]  Culture, sputum-assessment     Status: None   Collection Time: 02/23/18  6:58 PM  Result Value Ref Range Status   Specimen Description SPUTUM  Final   Special Requests NONE  Final   Sputum evaluation   Final    Sputum specimen not acceptable for testing.  Please recollect.   RESULT CALLED TO, READ BACK BY AND VERIFIED WITH: VICTORIA,RN @2203  02/23/18 MKELLY Performed at [facility], [place] [address], [place],  [address]    Report Status 02/23/2018 FINAL  Final     Time coordinating discharge in minutes:  97   SIGNED:   Debbe Odea, MD  [facility] 02/24/2018, 11:23 AM Pager   If 7PM-7AM, please contact night-coverage [email or web address] Password [email or web address]

## 2018-02-27 LAB — CULTURE, BLOOD (ROUTINE X 2)
CULTURE: NO GROWTH
Culture: NO GROWTH
Special Requests: ADEQUATE
Special Requests: ADEQUATE

## 2018-03-16 ENCOUNTER — Telehealth: Payer: Self-pay | Admitting: Family Medicine

## 2018-03-16 NOTE — Telephone Encounter (Signed)
I tried calling her multiple times. However, her telephone line was engaging. I was unable to leave a message either.   Jennifer Foster, mention she came in to the clinic for Iron infusion. No record of this on file. I am unclear who recommended it. I will have team contact her to schedule follow-up with me soon for reassessment. In the mean time, she may continue oral ferrous sulfate.

## 2018-03-16 NOTE — Telephone Encounter (Signed)
Scheduled for 7/23, pcps next available.

## 2018-03-19 ENCOUNTER — Encounter (INDEPENDENT_AMBULATORY_CARE_PROVIDER_SITE_OTHER): Payer: Self-pay

## 2018-03-19 ENCOUNTER — Ambulatory Visit (INDEPENDENT_AMBULATORY_CARE_PROVIDER_SITE_OTHER): Payer: Medicare HMO | Admitting: Internal Medicine

## 2018-03-19 ENCOUNTER — Encounter: Payer: Self-pay | Admitting: Internal Medicine

## 2018-03-19 VITALS — BP 180/100 | HR 63 | Ht 64.0 in | Wt 160.0 lb

## 2018-03-19 DIAGNOSIS — R0602 Shortness of breath: Secondary | ICD-10-CM | POA: Diagnosis not present

## 2018-03-19 DIAGNOSIS — D649 Anemia, unspecified: Secondary | ICD-10-CM

## 2018-03-19 DIAGNOSIS — I2510 Atherosclerotic heart disease of native coronary artery without angina pectoris: Secondary | ICD-10-CM | POA: Diagnosis not present

## 2018-03-19 DIAGNOSIS — I480 Paroxysmal atrial fibrillation: Secondary | ICD-10-CM

## 2018-03-19 DIAGNOSIS — I10 Essential (primary) hypertension: Secondary | ICD-10-CM | POA: Diagnosis not present

## 2018-03-19 MED ORDER — AMLODIPINE BESYLATE 5 MG PO TABS: 5.0000 mg | ORAL_TABLET | Freq: Every day | ORAL | 3 refills | Status: DC

## 2018-03-19 NOTE — Progress Notes (Signed)
Follow-up Outpatient Visit Date: 03/19/2018  Primary Care Provider: [name], MD [place] [id number]  Chief Complaint: Shortness of breath  HPI:  Jennifer Foster is a 72 y.o. year-old female with history of coronary artery disease, paroxysmal atrial fibrillation, hypertension, chronic anemia and remote GI bleed, CKD stage 3, and prior substance abuse (stopped using cocaine ~1 year ago), who presents for follow-up of CAD and PAF.  I last saw her in March after abnormal stress test.  We subsequently performed cardiac catheterization that shwed a tortuous and diffusely calcified RCA with multifocal disease of up to 60-70%.  We opted to proceed with medical therapy, given minimal symptoms.  She was doing well without chest pain, shortness of breath, and palpitations at her follow-up visit in May with Richardson Dopp, PA.  Unfortunately, Ms. Baxley has experienced increasing shortness of breath over the last 4-6 weeks and was admitted for community-acquired pneumonia at [facility] in late June.  She was treated with a course of antibiotics but reports that her dyspnea has not improved much since that time.  She continues to feel short of breath with minimal activity and sometimes even sitting still.  She has not had any chest pain, palpitations, lightheadedness, orthopnea, PND, or edema.  She believes that her weight has been stable.  She is taking her medications as prescribed.  She remains on apixaban without bleeding.  She also denies fevers and chills.  She was prescribed an inhaler after her hospitalization and notes slight improvement in her shortness of breath when using this.  She notes that she has been eating more salt recently.  She notes that amlodipine was discontinued during her hospitalization.  She wonders if she needs to go back on this medication.  --------------------------------------------------------------------------------------------------  Cardiovascular  History & Procedures: Cardiovascular Problems:  Paroxysmal atrial fibrillation  Chest pain  Risk Factors:  Hypertension, tobacco use, and age > 64  Cath/PCI:  LHC (12/03/17): LMCA normal.  LAD with mild luminal irregularities.  LCx with 30-40% stenosis as well ast 30% stenosis of OM2.  RCA diffusely calcified with long 60% mid vessel lesion and focal 60-70% distal stenosis.  LVEDP 15-20 mmHg.  CV Surgery:  None  EP Procedures and Devices:  None  Non-Invasive Evaluation(s):  Pharmacologic MPI (11/12/17): Intermediate risk study with moderate in size, moderate in severity, partially reversible anterior defect. Small, fixed basal/mid inerior defect suggestive of scar and/or artifact. LVEF 50% with inferior hypokinesis.  TTE (10/22/17): Normal LV size with moderate LVH. LVEF 60-65% with grade 2 diastolic dysfunction. Moderately to severely dilated LA. Normal RV size and function. Normal PA and central venous pressures.  Pharmacologic MPI (11/22/14): Normal study without ischemia or scar. LVEF 56%.   Recent CV Pertinent Labs: Lab Results  Component Value Date   CHOL 149 08/07/2017   HDL 57 08/07/2017   LDLCALC 74 08/07/2017   LDLDIRECT 88 07/16/2011   TRIG 88 08/07/2017   CHOLHDL 2.6 08/07/2017   CHOLHDL 2.3 03/18/2016   INR 1.0 11/24/2017   K 3.6 03/19/2018   MG 1.7 02/24/2018   BUN 20 03/19/2018   CREATININE 1.50 (H) 03/19/2018   CREATININE 1.47 (H) 02/02/2013    Past medical and surgical history were reviewed and updated in EPIC.  Current Meds  Medication Sig  . albuterol (PROVENTIL HFA;VENTOLIN HFA) 108 (90 Base) MCG/ACT inhaler Inhale 2 puffs into the lungs every 6 (six) hours as needed for wheezing or shortness of breath.  . baclofen 5  MG TABS Take 5 mg by mouth 2 (two) times daily as needed for muscle spasms.  . irbesartan (AVAPRO) 300 MG tablet Take 1 tablet (300 mg total) by mouth daily.  . metoprolol succinate (TOPROL-XL) 25 MG 24 hr tablet Take 1 tablet  (25 mg total) by mouth daily.  . nitroGLYCERIN (NITROSTAT) 0.4 MG SL tablet Place 1 tablet (0.4 mg total) under the tongue every 5 (five) minutes as needed for chest pain. If do not resolve after 1 tablet, go to ED  . rosuvastatin (CRESTOR) 20 MG tablet TAKE 1 TABLET BY MOUTH AT BEDTIME  . [DISCONTINUED] ELIQUIS 5 MG TABS tablet TAKE 1 TABLET BY MOUTH TWICE DAILY    Allergies: Ace inhibitors; Lisinopril; and Tramadol  Social History   Tobacco Use  . Smoking status: Current Some Day Smoker    Packs/day: 0.25    Types: Cigarettes  . Smokeless tobacco: Never Used  Substance Use Topics  . Alcohol use: Yes    Alcohol/week: 0.0 oz    Comment: occasionally  . Drug use: No    Family History  Problem Relation Age of Onset  . Diabetes type II Unknown   . Kidney failure Unknown   . Kidney failure Son     Review of Systems: A 12-system review of systems was performed and was negative except as noted in the HPI.  --------------------------------------------------------------------------------------------------  Physical Exam: BP (!) 180/100   Pulse 63   Ht 5\' 4"  (1.626 m)   Wt 160 lb (72.6 kg)   SpO2 96%   BMI 27.46 kg/m   General: NAD. HEENT: Mild conjunctival pallor noted without scleral icterus. Moist mucous membranes.  OP clear. Neck: Supple without lymphadenopathy, thyromegaly, JVD, or HJR. Lungs: Normal work of breathing. Clear to auscultation bilaterally without wheezes or crackles. Heart: Regular rate and rhythm without murmurs, rubs, or gallops. Non-displaced PMI. Abd: Bowel sounds present. Soft, NT/ND without hepatosplenomegaly Ext: No lower extremity edema. Skin: Warm and dry without rash.  EKG: Normal sinus rhythm with PACs, LVH with lateral ST/T changes that could represent abnormal repolarization or ischemic changes.  QT prolongation.  No significant change from prior tracing on 01/01/2018.  Lab Results  Component Value Date   WBC 5.4 03/19/2018   HGB 8.7 (L)  03/19/2018   HCT 26.9 (L) 03/19/2018   MCV 86 03/19/2018   PLT 211 03/19/2018    Lab Results  Component Value Date   NA 144 03/19/2018   K 3.6 03/19/2018   CL 107 (H) 03/19/2018   CO2 24 03/19/2018   BUN 20 03/19/2018   CREATININE 1.50 (H) 03/19/2018   GLUCOSE 76 03/19/2018   ALT 9 02/24/2018    Lab Results  Component Value Date   CHOL 149 08/07/2017   HDL 57 08/07/2017   LDLCALC 74 08/07/2017   LDLDIRECT 88 07/16/2011   TRIG 88 08/07/2017   CHOLHDL 2.6 08/07/2017    --------------------------------------------------------------------------------------------------  ASSESSMENT AND PLAN: Shortness of breath It is difficult to know what is driving this.  There are multiple potential causes, including diastolic heart failure, though weight is stable and Ms. Mailhot does not appear significantly volume overloaded on exam.  She also has chronic anemia.  If hemoglobin has dropped further, dyspnea may be a manifestation of anemia.  Coronary insufficiency is also a consideration, though Ms. Bisaillon has not had any chest pain.  Her catheterization this spring did show significant single-vessel CAD, though at that time, she was having minimal symptoms.  Recurrent pneumonia is another  concern, though lungs are clear on exam today.  I have a low suspicion for pulmonary embolism, given that the patient is on chronic anticoagulation with apixaban.  We will obtain a CBC, basic metabolic panel, NT-proBNP, and chest radiograph today for further assessment.  Ms. Mihalic is also scheduled to follow-up with her PCP early next week for further evaluation.  I have advised her to go to the emergency department if her symptoms worsen significantly in the meantime.  Coronary artery disease No chest pain reported.  Continue medical therapy to prevent progression of disease, including rosuvastatin 20 mg daily.  The patient is on apixaban in lieu of aspirin, given paroxysmal atrial fibrillation.  Paroxysmal atrial  fibrillation No symptoms to suggest recurrence.  EKG today demonstrates sinus rhythm with PACs.  We will continue current doses of metoprolol as well as indefinite anticoagulation with apixaban given CHADSVASC score of at least 4.  Hypertension Blood pressure poorly controlled.  This could be contributing to worsening shortness of breath in the setting of diastolic dysfunction.  We will continue current dose of irbesartan and also restart amlodipine 5 mg daily.  Chronic anemia Hemoglobin between 9 and 10 for the last few months.  Etiology is uncertain, though I suspect chronic disease in the setting of CKD.  We will check a hemoglobin today to ensure that it has not dropped further.  If hemoglobin is down, we will need to consider stopping apixaban.  I will defer further work-up to Ms. Mcneil's PCP, whom she is seeing early next week.  Follow-up: Return to clinic in 2 to 3 weeks with Richardson Dopp, PA.  Nelva Bush, MD 03/20/2018 4:39 PM

## 2018-03-19 NOTE — Patient Instructions (Addendum)
Medication Instructions:  START AMLODIPINE 5mg  daily   -- If you need a refill on your cardiac medications before your next appointment, please call your pharmacy. --  Labwork: CBC/BMET/BNP  Testing/Procedures:CHEST XRAY:   [place]  A chest x-ray takes a picture of the organs and structures inside the chest, including the heart, lungs, and blood vessels. This test can show several things, including, whether the heart is enlarges; whether fluid is building up in the lungs; and whether pacemaker / defibrillator leads are still in place.   Follow-Up: Your physician wants you to follow-up in: 2-3 WEEKS with SCOTT or APP.    Thank you for choosing [facility]!!    Any Other Special Instructions Will Be Listed Below (If Applicable).

## 2018-03-20 ENCOUNTER — Telehealth: Payer: Self-pay | Admitting: Internal Medicine

## 2018-03-20 LAB — CBC
HEMATOCRIT: 26.9 % — AB (ref 34.0–46.6)
Hemoglobin: 8.7 g/dL — ABNORMAL LOW (ref 11.1–15.9)
MCH: 27.9 pg (ref 26.6–33.0)
MCHC: 32.3 g/dL (ref 31.5–35.7)
MCV: 86 fL (ref 79–97)
PLATELETS: 211 10*3/uL (ref 150–450)
RBC: 3.12 x10E6/uL — AB (ref 3.77–5.28)
RDW: 13.6 % (ref 12.3–15.4)
WBC: 5.4 10*3/uL (ref 3.4–10.8)

## 2018-03-20 LAB — BASIC METABOLIC PANEL
BUN / CREAT RATIO: 13 (ref 12–28)
BUN: 20 mg/dL (ref 8–27)
CO2: 24 mmol/L (ref 20–29)
CREATININE: 1.5 mg/dL — AB (ref 0.57–1.00)
Calcium: 8.9 mg/dL (ref 8.7–10.3)
Chloride: 107 mmol/L — ABNORMAL HIGH (ref 96–106)
GFR calc Af Amer: 41 mL/min/{1.73_m2} — ABNORMAL LOW (ref >59)
GFR calc non Af Amer: 35 mL/min/{1.73_m2} — ABNORMAL LOW (ref >59)
Glucose: 76 mg/dL (ref 65–99)
Potassium: 3.6 mmol/L (ref 3.5–5.2)
Sodium: 144 mmol/L (ref 134–144)

## 2018-03-20 LAB — PRO B NATRIURETIC PEPTIDE: NT-Pro BNP: 17931 pg/mL — ABNORMAL HIGH (ref 0–301)

## 2018-03-20 MED ORDER — FUROSEMIDE 40 MG PO TABS: 40.0000 mg | ORAL_TABLET | Freq: Every day | ORAL | 2 refills | Status: DC

## 2018-03-20 MED ORDER — POTASSIUM CHLORIDE CRYS ER 20 MEQ PO TBCR: 20.0000 meq | EXTENDED_RELEASE_TABLET | Freq: Every day | ORAL | 2 refills | Status: DC

## 2018-03-20 NOTE — Telephone Encounter (Signed)
Labs from yesterday's visit were discussed with the patient.  Notable findings include hemoglobin of 8.7, down from 9.4 and NT-proBNP of >17,000.  I have advised Ms. Recinos to stop apixaban and to f/u with her PCP as planned in 3 days for further evaluation of anemia.  We will also start furosemide 40 mg PO daily and KCl 20 mEq daily.  She should have a BMP checked by her PCP next week or in our office.  Nelva Bush, MD [facility] Pager: [phone]

## 2018-03-22 ENCOUNTER — Encounter: Payer: Self-pay | Admitting: Family Medicine

## 2018-03-22 NOTE — Telephone Encounter (Signed)
Spoke with the pt and reiterated Dr Darnelle Bos instructions given to her 2 days ago, by Dr End.  Informed the pt that Dr End wants her to make sure her PCP draws labs on her this week, especially a BMET, for further evaluation of multiple med changes. Pt states that she is going in to see her PCP tomorrow 7/23 at 0830, and they are going to draw labs on her.  Pt states she will tell them that Dr End especially wants them to draw a BMET on her.  Pt states her PCP will draw this and have the results sent to Dr End and Legrand Como RN, once results available.  Pt verbalized understanding and agrees with this plan.  Pt very gracious for all the assistance provided.

## 2018-03-23 ENCOUNTER — Ambulatory Visit (INDEPENDENT_AMBULATORY_CARE_PROVIDER_SITE_OTHER): Payer: Medicare HMO | Admitting: Family Medicine

## 2018-03-23 ENCOUNTER — Other Ambulatory Visit: Payer: Self-pay

## 2018-03-23 ENCOUNTER — Encounter: Payer: Self-pay | Admitting: Family Medicine

## 2018-03-23 VITALS — BP 128/76 | HR 61 | Temp 97.9°F | Wt 158.0 lb

## 2018-03-23 DIAGNOSIS — D631 Anemia in chronic kidney disease: Secondary | ICD-10-CM | POA: Diagnosis not present

## 2018-03-23 DIAGNOSIS — I480 Paroxysmal atrial fibrillation: Secondary | ICD-10-CM

## 2018-03-23 DIAGNOSIS — I10 Essential (primary) hypertension: Secondary | ICD-10-CM | POA: Diagnosis not present

## 2018-03-23 DIAGNOSIS — D649 Anemia, unspecified: Secondary | ICD-10-CM

## 2018-03-23 DIAGNOSIS — N189 Chronic kidney disease, unspecified: Secondary | ICD-10-CM

## 2018-03-23 DIAGNOSIS — I5032 Chronic diastolic (congestive) heart failure: Secondary | ICD-10-CM

## 2018-03-23 HISTORY — DX: Chronic diastolic (congestive) heart failure: I50.32

## 2018-03-23 LAB — HEMOCCULT GUIAC POC 1CARD (OFFICE): FECAL OCCULT BLD: NEGATIVE

## 2018-03-23 MED ORDER — ASPIRIN EC 81 MG PO TBEC
81.0000 mg | DELAYED_RELEASE_TABLET | Freq: Every day | ORAL | 0 refills | Status: DC
Start: 2018-03-23 — End: 2018-04-13

## 2018-03-23 NOTE — Assessment & Plan Note (Signed)
BP looks good on current regimen. Continue same. Bmet checked. Was started on Lasix by Cards, need to monitor kidney function. Result should be accessible to her Cardiologist on Epic.

## 2018-03-23 NOTE — Assessment & Plan Note (Addendum)
SOB likely related to her CHF. Recent Pro-BNP > 17, 000 She was started on Lasix by Cards. Already on Metoprolol. Monitor closely. F/U with Cards as planned.

## 2018-03-23 NOTE — Assessment & Plan Note (Signed)
Likely anemia of chronic disease secondary to CKD. Recent anemia panel looks good. Rechecked CBC today. POC FOBT negative for blood. I will contact her with the result and make appropriate recommendation.

## 2018-03-23 NOTE — Patient Instructions (Signed)
Heart Failure °Heart failure means your heart has trouble pumping blood. This makes it hard for your body to work well. Heart failure is usually a long-term (chronic) condition. You must take good care of yourself and follow your doctor's treatment plan. °Follow these instructions at home: °· Take your heart medicine as told by your doctor. °? Do not stop taking medicine unless your doctor tells you to. °? Do not skip any dose of medicine. °? Refill your medicines before they run out. °? Take other medicines only as told by your doctor or pharmacist. °· Stay active if told by your doctor. The elderly and people with severe heart failure should talk with a doctor about physical activity. °· Eat heart-healthy foods. Choose foods that are without trans fat and are low in saturated fat, cholesterol, and salt (sodium). This includes fresh or frozen fruits and vegetables, fish, lean meats, fat-free or low-fat dairy foods, whole grains, and high-fiber foods. Lentils and dried peas and beans (legumes) are also good choices. °· Limit salt if told by your doctor. °· Cook in a healthy way. Roast, grill, broil, bake, poach, steam, or stir-fry foods. °· Limit fluids as told by your doctor. °· Weigh yourself every morning. Do this after you pee (urinate) and before you eat breakfast. Write down your weight to give to your doctor. °· Take your blood pressure and write it down if your doctor tells you to. °· Ask your doctor how to check your pulse. Check your pulse as told. °· Lose weight if told by your doctor. °· Stop smoking or chewing tobacco. Do not use gum or patches that help you quit without your doctor's approval. °· Schedule and go to doctor visits as told. °· Nonpregnant women should have no more than 1 drink a day. Men should have no more than 2 drinks a day. Talk to your doctor about drinking alcohol. °· Stop illegal drug use. °· Stay current with shots (immunizations). °· Manage your health conditions as told by your  doctor. °· Learn to manage your stress. °· Rest when you are tired. °· If it is really hot outside: °? Avoid intense activities. °? Use air conditioning or fans, or get in a cooler place. °? Avoid caffeine and alcohol. °? Wear loose-fitting, lightweight, and light-colored clothing. °· If it is really cold outside: °? Avoid intense activities. °? Layer your clothing. °? Wear mittens or gloves, a hat, and a scarf when going outside. °? Avoid alcohol. °· Learn about heart failure and get support as needed. °· Get help to maintain or improve your quality of life and your ability to care for yourself as needed. °Contact a doctor if: °· You gain weight quickly. °· You are more short of breath than usual. °· You cannot do your normal activities. °· You tire easily. °· You cough more than normal, especially with activity. °· You have any or more puffiness (swelling) in areas such as your hands, feet, ankles, or belly (abdomen). °· You cannot sleep because it is hard to breathe. °· You feel like your heart is beating fast (palpitations). °· You get dizzy or light-headed when you stand up. °Get help right away if: °· You have trouble breathing. °· There is a change in mental status, such as becoming less alert or not being able to focus. °· You have chest pain or discomfort. °· You faint. °This information is not intended to replace advice given to you by your health care provider. Make sure you   discuss any questions you have with your health care provider. °Document Released: 05/27/2008 Document Revised: 01/24/2016 Document Reviewed: 10/04/2012 °Elsevier Interactive Patient Education © 2017 [employer] Inc[employer] ° °

## 2018-03-23 NOTE — Assessment & Plan Note (Signed)
Rate and Rhythm controlled. Likely PAF. Continue Afib management per Cards. It seems that they stopped her Eliquis. She will benefit from ASA while off Eliquis. Will d/c ASA once Cards restart Eliquis. She will discuss at her next Cards follow-up.

## 2018-03-23 NOTE — Progress Notes (Signed)
Subjective:     Patient ID: Jennifer Foster, female   DOB: [birth year], 72 y.o.   MRN: [id number]  HPI HTN: Here for follow-up. She is currently on Norvasc 5 mg qd,Lasix 40 mg qd,Metoprolol 25 mg Qd. She stated her Cardiologist stopped her Avapro. Denies any other concern related to her BP today. [id number] any SOB today. She stated this occurs mostly with exertion. She denies any cough or wheezing. No chest pain. She had lab work done at her [employer] office recently. She uses Albuterol as needed for SOB.  Anemia: Denies bleeding, no GI symptoms.  Current Outpatient Medications on File Prior to Visit  Medication Sig Dispense Refill  . albuterol (PROVENTIL HFA;VENTOLIN HFA) 108 (90 Base) MCG/ACT inhaler Inhale 2 puffs into the lungs every 6 (six) hours as needed for wheezing or shortness of breath. 1 Inhaler 2  . amLODipine (NORVASC) 5 MG tablet Take 1 tablet (5 mg total) by mouth daily. 180 tablet 3  . baclofen 5 MG TABS Take 5 mg by mouth 2 (two) times daily as needed for muscle spasms. 30 tablet 0  . furosemide (LASIX) 40 MG tablet Take 1 tablet (40 mg total) by mouth daily. 30 tablet 2  . irbesartan (AVAPRO) 300 MG tablet Take 1 tablet (300 mg total) by mouth daily. 90 tablet 3  . metoprolol succinate (TOPROL-XL) 25 MG 24 hr tablet Take 1 tablet (25 mg total) by mouth daily. 30 tablet 0  . nitroGLYCERIN (NITROSTAT) 0.4 MG SL tablet Place 1 tablet (0.4 mg total) under the tongue every 5 (five) minutes as needed for chest pain. If do not resolve after 1 tablet, go to ED 10 tablet 0  . potassium chloride SA (K-DUR,KLOR-CON) 20 MEQ tablet Take 1 tablet (20 mEq total) by mouth daily. 30 tablet 2  . rosuvastatin (CRESTOR) 20 MG tablet TAKE 1 TABLET BY MOUTH AT BEDTIME 90 tablet 1   No current facility-administered medications on file prior to visit.    Past Medical History:  Diagnosis Date  . Acute blood loss anemia 06/26/2015  . Acute blood loss anemia 06/26/2015  . Aortic atherosclerosis ([place])  01/01/2018   CT 04/2017  . Asthma   . Chronic kidney disease (CKD), stage III (moderate) ([place]) 06/27/2015  . DIVERTICULAR BLEEDING, HX OF 10/14/2007   Colonoscopy 2008 showed diverticulosis.   . Dizziness 11/25/2016  . GERD (gastroesophageal reflux disease)   . Gout   . History of anemia   . History of asthma   . Hyperlipemia   . Hypertension   . Joint pain   . Lipoma   . SOB (shortness of breath) 03/18/2016   Vitals:   03/23/18 0901  BP: 128/76  Pulse: 61  Temp: 97.9 F (36.6 C)  TempSrc: Oral  SpO2: 97%  Weight: 158 lb (71.7 kg)     Review of Systems  Respiratory: Positive for shortness of breath. Negative for cough and wheezing.   Cardiovascular: Negative.  Negative for chest pain.  Gastrointestinal: Negative.   Genitourinary: Negative.   Musculoskeletal: Negative.   Neurological: Negative.   All other systems reviewed and are negative.      Objective:   Physical Exam  Constitutional: She appears well-developed. She does not appear ill. No distress.  Pulmonary/Chest: Effort normal and breath sounds normal. No apnea and no bradypnea. No respiratory distress. She has no decreased breath sounds. She has no wheezes. She has no rhonchi. She has no rales.  Abdominal: Soft. Bowel sounds are normal. She exhibits no  mass. There is no tenderness.  Genitourinary: Rectum normal. Rectal exam shows guaiac negative stool.  Musculoskeletal:       Right lower leg: She exhibits no edema.       Left lower leg: She exhibits no edema.  Neurological: She is alert.  Nursing note and vitals reviewed.     Assessment:     HTN SOB: Likely CHF/Afib Anemia    Plan:     Check problem list.

## 2018-03-24 ENCOUNTER — Other Ambulatory Visit (HOSPITAL_COMMUNITY): Payer: Self-pay | Admitting: *Deleted

## 2018-03-24 ENCOUNTER — Telehealth: Payer: Self-pay | Admitting: Family Medicine

## 2018-03-24 LAB — BASIC METABOLIC PANEL
BUN/Creatinine Ratio: 11 — ABNORMAL LOW (ref 12–28)
BUN: 19 mg/dL (ref 8–27)
CHLORIDE: 106 mmol/L (ref 96–106)
CO2: 21 mmol/L (ref 20–29)
Calcium: 9.2 mg/dL (ref 8.7–10.3)
Creatinine, Ser: 1.67 mg/dL — ABNORMAL HIGH (ref 0.57–1.00)
GFR calc Af Amer: 36 mL/min/{1.73_m2} — ABNORMAL LOW (ref >59)
GFR calc non Af Amer: 31 mL/min/{1.73_m2} — ABNORMAL LOW (ref >59)
Glucose: 103 mg/dL — ABNORMAL HIGH (ref 65–99)
POTASSIUM: 3.7 mmol/L (ref 3.5–5.2)
SODIUM: 141 mmol/L (ref 134–144)

## 2018-03-24 LAB — CBC WITH DIFFERENTIAL/PLATELET
BASOS ABS: 0 10*3/uL (ref 0.0–0.2)
Basos: 1 %
EOS (ABSOLUTE): 0.3 10*3/uL (ref 0.0–0.4)
Eos: 5 %
Hematocrit: 27.3 % — ABNORMAL LOW (ref 34.0–46.6)
Hemoglobin: 8.7 g/dL — ABNORMAL LOW (ref 11.1–15.9)
Immature Grans (Abs): 0 10*3/uL (ref 0.0–0.1)
Immature Granulocytes: 0 %
Lymphocytes Absolute: 1.5 10*3/uL (ref 0.7–3.1)
Lymphs: 28 %
MCH: 26.5 pg — ABNORMAL LOW (ref 26.6–33.0)
MCHC: 31.9 g/dL (ref 31.5–35.7)
MCV: 83 fL (ref 79–97)
MONOS ABS: 0.7 10*3/uL (ref 0.1–0.9)
Monocytes: 12 %
Neutrophils Absolute: 2.9 10*3/uL (ref 1.4–7.0)
Neutrophils: 54 %
PLATELETS: 192 10*3/uL (ref 150–450)
RBC: 3.28 x10E6/uL — AB (ref 3.77–5.28)
RDW: 14.5 % (ref 12.3–15.4)
WBC: 5.4 10*3/uL (ref 3.4–10.8)

## 2018-03-24 NOTE — Telephone Encounter (Signed)
Test result discussed with the patient.  Hbg remains same at 8.7. POC FOBT done yesterday was negative for blood. Hence GI bleed less likely.  Kidney function worsened a bit. Likely due to Lasix she recently started.   As discussed with the patient, this is likely anemia of chronic disease secondary to CKD. She will benefit from EPO. Schedule f/u with Nephrology soon. She stated that she has an upcoming appointment with Nephrology.  She also has F/U with Cards tomorrow. Advised to discussed with cardiologist the use of Eliquis vs ASA for her PAF.  I will defer plan to her Cardiologist regarding this.  Note forwarded to her cardiologists to follow.

## 2018-03-24 NOTE — Telephone Encounter (Signed)
Spoke to patient with Dr Darnelle Bos recommendation.  She verbalized understanding.

## 2018-03-24 NOTE — Telephone Encounter (Signed)
Thank you for the update.  I will defer starting back apixaban until she sees Scott next month.  In the meantime, which I think it is reasonable for her to take aspirin 81 mg daily.

## 2018-03-25 ENCOUNTER — Encounter (HOSPITAL_COMMUNITY): Payer: Self-pay

## 2018-03-25 ENCOUNTER — Ambulatory Visit (HOSPITAL_COMMUNITY)
Admission: RE | Admit: 2018-03-25 | Discharge: 2018-03-25 | Disposition: A | Discharge status: Home / Self Care | Payer: Medicare HMO | Source: Ambulatory Visit | Attending: Nephrology | Admitting: Nephrology

## 2018-03-25 DIAGNOSIS — D631 Anemia in chronic kidney disease: Secondary | ICD-10-CM | POA: Insufficient documentation

## 2018-03-25 DIAGNOSIS — N189 Chronic kidney disease, unspecified: Secondary | ICD-10-CM | POA: Diagnosis present

## 2018-03-25 MED ORDER — SODIUM CHLORIDE 0.9 % IV SOLN
510.0000 mg | INTRAVENOUS | Status: DC
  Administered 2018-03-25: 510 mg via INTRAVENOUS
  Filled 2018-03-25: qty 17

## 2018-03-25 NOTE — Discharge Instructions (Signed)

## 2018-04-01 ENCOUNTER — Encounter (HOSPITAL_COMMUNITY)
Admission: RE | Admit: 2018-04-01 | Discharge: 2018-04-01 | Disposition: A | Discharge status: Home / Self Care | Payer: Medicare HMO | Source: Ambulatory Visit | Attending: Nephrology | Admitting: Nephrology

## 2018-04-01 DIAGNOSIS — D631 Anemia in chronic kidney disease: Secondary | ICD-10-CM | POA: Diagnosis present

## 2018-04-01 DIAGNOSIS — N189 Chronic kidney disease, unspecified: Secondary | ICD-10-CM | POA: Diagnosis present

## 2018-04-01 MED ORDER — FERUMOXYTOL INJECTION 510 MG/17 ML
510.0000 mg | INTRAVENOUS | Status: DC
  Administered 2018-04-01: 510 mg via INTRAVENOUS
  Filled 2018-04-01: qty 17

## 2018-04-12 NOTE — Progress Notes (Signed)
Cardiology Office Note:    Date:  04/13/2018   ID:  Jennifer Foster, DOB Mar 27, 1946, MRN [id number]  PCP:  [name], MD  Cardiologist:  Nelva Bush, MD   Referring MD: [name], MD   Chief Complaint  Patient presents with  . Follow-up    CHF, Parox AFib    History of Present Illness:    Jennifer Foster is a 72 y.o. female with paroxysmal atrial fibrillation, hypertension, chronic anemia and remote GI bleed, CKD stage 3, and prior substance abuse with cocaine, anemia. Stress testing in March 2019 demonstrated partially reversible anterior defect concerning for ischemia. Cardiac Catheterization demonstrated single-vessel CAD with mid RCA 60% and focal 60-70% distal RCA. There was mild to moderate disease involving a large tortuous LAD and LCx. Medical therapy was recommended.   She was admitted in June 2019 with community-acquired pneumonia.  She was last seen by Dr. Harrell Gave End in 03/2018.  She complained of continued shortness of breath.   Her hemoglobin was worse and her BNP was significantly elevated.  Her anticoagulation was held (Apixaban) and she was placed on Furosemide.  She has been seen by her PCP and hemoccult was negative. It is felt her anemia is due to chronic disease.    Ms. [name] returns for follow-up.  She is here alone.  She notes that she is feeling much better.  She has been walking on a regular basis around her block.  She has actually been able to walk farther.  She denies orthopnea, PND.  She denies chest pain, syncope.  She denies any bleeding issues.  Prior CV studies:   The following studies were reviewed today:  Cardiac catheterization 12/03/2017 LAD mild disease LCx proximal 35; OM2 30 RCA mid 60, distal 65 LVEDP 15-20  Nuclear stress test 11/12/2017 Medium size, moderate severity partially reversible anterior perfusion defect suggestive of ischemia. There is also a smaller, basal to mid inferior perfusion defect which is fixed,  suggestive of artifact or scar. LVEF 50% with inferior hypokinesis. This is an intermediate risk study. Clinical correlation is recommended.  Echo 10/22/2017 Moderate LVH, EF 53-61, grade 2 diastolic dysfunction, trivial MR, moderate to severe LAE, PASP 30  Chest CT w/ contrast 04/03/17 IMPRESSION: 1. No suspicious pulmonary nodule or mass. 2. 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 Recommend annual imaging followup by CTA or MRA. This recommendation follows 2010 ACCF/AHA/AATS/ACR/ASA/SCA/SCAI/SIR/STS/SVM Guidelines for the Diagnosis and Management of Patients with Thoracic Aortic Disease. Circulation. 2010; 121: W431-V400 3. Coronary artery calcifications 4. Mild centrilobular pulmonary emphysema. 5. Multinodular thyroid goiter. 6. Left adrenal thickening likely representing hyperplasia and 1.9 cm right adrenal adenoma. Aortic Atherosclerosis (ICD10-170.0); Aortic Atherosclerosis (ICD10-I70.0) and Emphysema (ICD10-J43.9).  Holter 7/18 NSR with PAC's Brief short SVT Overall, unremarkable  Nuclear stress test 11/23/2014 Normal stress nuclear study.overall low risk with no evidence of significant ischemia identified. Left ventricular hypertrophy suggested by increased wall thickness seen on perfusion images.  LV Ejection Fraction: 56%.  Echo 03/16/2013 Severe LVH, EF 55-60, mild MR, mild to moderate LAE, PASP 33  Past Medical History:  Diagnosis Date  . Acute blood loss anemia 06/26/2015  . Acute blood loss anemia 06/26/2015  . Aortic atherosclerosis ([place]) 01/01/2018   CT 04/2017  . Asthma   . Chronic kidney disease (CKD), stage III (moderate) ([place]) 06/27/2015  . DIVERTICULAR BLEEDING, HX OF 10/14/2007   Colonoscopy 2008 showed diverticulosis.   . Dizziness 11/25/2016  . GERD (  gastroesophageal reflux disease)   . Gout   . History of anemia   . History of asthma   . Hyperlipemia   . Hypertension   . Joint pain   .  Lipoma   . SOB (shortness of breath) 03/18/2016   Surgical Hx: The patient  has a past surgical history that includes Abdominal hysterectomy (1981); Lipoma excision (01/28/11); Colonoscopy (N/A, 12/08/2013); Esophagogastroduodenoscopy (N/A, 12/08/2013); Givens capsule study (N/A, 12/08/2013); Colonoscopy (N/A, 06/28/2015); and LEFT HEART CATH AND CORONARY ANGIOGRAPHY (N/A, 12/03/2017).   Current Medications: Current Meds  Medication Sig  . albuterol (PROVENTIL HFA;VENTOLIN HFA) 108 (90 Base) MCG/ACT inhaler Inhale 2 puffs into the lungs every 6 (six) hours as needed for wheezing or shortness of breath.  [name] amLODipine (NORVASC) 5 MG tablet Take 1 tablet (5 mg total) by mouth daily.  [name] aspirin EC 81 MG tablet Take 1 tablet (81 mg total) by mouth daily.  . baclofen 5 MG TABS Take 5 mg by mouth 2 (two) times daily as needed for muscle spasms.  . furosemide (LASIX) 40 MG tablet Take 1 tablet (40 mg total) by mouth daily.  . metoprolol succinate (TOPROL-XL) 25 MG 24 hr tablet Take 1 tablet (25 mg total) by mouth daily.  . nitroGLYCERIN (NITROSTAT) 0.4 MG SL tablet Place 1 tablet (0.4 mg total) under the tongue every 5 (five) minutes as needed for chest pain. If do not resolve after 1 tablet, go to ED  . potassium chloride SA (K-DUR,KLOR-CON) 20 MEQ tablet Take 1 tablet (20 mEq total) by mouth daily.  . rosuvastatin (CRESTOR) 20 MG tablet TAKE 1 TABLET BY MOUTH AT BEDTIME  . Vitamin D, Ergocalciferol, (DRISDOL) 50000 units CAPS capsule Take 50,000 Units by mouth every 7 (seven) days.     Allergies:   Ace inhibitors; Lisinopril; and Tramadol   Social History   Tobacco Use  . Smoking status: Former Smoker    Packs/day: 0.25    Types: Cigarettes  . Smokeless tobacco: Never Used  Substance Use Topics  . Alcohol use: Yes    Alcohol/week: 0.0 standard drinks    Comment: occasionally  . Drug use: No     Family Hx: The patient's family history includes Diabetes type II in her unknown relative; Kidney  failure in her son and unknown relative.  ROS:   Please see the history of present illness.    ROS All other systems reviewed and are negative.   EKGs/Labs/Other Test Reviewed:    EKG:  EKG is  ordered today.  The ekg ordered today demonstrates normal sinus rhythm, heart rate 66, left axis deviation, LVH with repolarization abnormality, QTC 463, no change from old EKG  Recent Labs: 04/14/2017: TSH 0.884 02/24/2018: ALT 9; Magnesium 1.7 03/19/2018: NT-Pro BNP 17,931 03/23/2018: BUN 19; Creatinine, Ser 1.67; Hemoglobin 8.7; Platelets 192; Potassium 3.7; Sodium 141   Recent Lipid Panel Lab Results  Component Value Date/Time   CHOL 149 08/07/2017 10:36 AM   TRIG 88 08/07/2017 10:36 AM   HDL 57 08/07/2017 10:36 AM   CHOLHDL 2.6 08/07/2017 10:36 AM   CHOLHDL 2.3 03/18/2016 11:20 AM   LDLCALC 74 08/07/2017 10:36 AM   LDLDIRECT 88 07/16/2011 11:38 AM    Physical Exam:    VS:  BP 130/70   Pulse 66   Ht 5\' 4"  (1.626 m)   Wt 164 lb 12.8 oz (74.8 kg)   SpO2 96%   BMI 28.29 kg/m     Wt Readings from Last 3 Encounters:  04/13/18  164 lb 12.8 oz (74.8 kg)  03/25/18 169 lb (76.7 kg)  03/23/18 158 lb (71.7 kg)     Physical Exam  Constitutional: She is oriented to person, place, and time. She appears well-developed and well-nourished. No distress.  HENT:  Head: Normocephalic and atraumatic.  Eyes: No scleral icterus.  Neck: No JVD present.  Cardiovascular: Normal rate and regular rhythm.  No murmur heard. Pulmonary/Chest: Effort normal. She has no wheezes. She has no rales.  Abdominal: Soft.  Musculoskeletal: She exhibits no edema.  Neurological: She is alert and oriented to person, place, and time.  Skin: Skin is warm and dry.  Psychiatric: She has a normal mood and affect.    ASSESSMENT & PLAN:    Chronic diastolic CHF (congestive heart failure) ([place]) Echo in February 2019 with normal EF and moderate diastolic dysfunction.  She was recently seen with worsening shortness of  breath and significant elevated BNP.  She is currently on furosemide 40 mg daily.  She has had markedly improved symptoms since starting diuretic therapy.  She is NYHA 2.  Continue current therapy.  Obtain follow-up BMET today.  Paroxysmal atrial fibrillation (HCC)  Maintaining sinus rhythm.  Her Apixaban was held secondary to worsening anemia.  Recent Hemoccult was negative with primary care.  I will obtain a follow-up CBC today.  If her hemoglobin is stable or improved, I will resume her Apixaban with close follow-up on her hemoglobin.  Coronary artery disease involving native coronary artery of native heart without angina pectoris Nonobstructive coronary artery disease by prior cardiac catheterization.  She denies anginal symptoms.  She is currently on aspirin.  If I resume her Apixaban, this will be stopped.  Continue statin therapy.  Thoracic aortic aneurysm without rupture (HCC) 4.1 cm by CT in 8/18.  Follow-up CT is due in 9/19.  Chronic kidney disease, stage 3 (HCC)  Obtain follow-up BMET today given chronic diuretic therapy.  Chronic anemia  Managed by primary care.  She recently had iron infusion.  Obtain follow-up CBC today.   Dispo:  Return in about 3 months (around 07/14/2018) for Routine Follow Up, w/ Richardson Dopp, PA-C.   Medication Adjustments/Labs and Tests Ordered: Current medicines are reviewed at length with the patient today.  Concerns regarding medicines are outlined above.  Tests Ordered: Orders Placed This Encounter  Procedures  . Basic Metabolic Panel (BMET)  . CBC  . EKG 12-Lead   Medication Changes: No orders of the defined types were placed in this encounter.   Signed, Richardson Dopp, PA-C  04/13/2018 1:13 PM    [facility] [place], [place], [place]  [address] Phone: [phone]; Fax: [phone]

## 2018-04-13 ENCOUNTER — Other Ambulatory Visit: Payer: Self-pay

## 2018-04-13 ENCOUNTER — Encounter: Payer: Self-pay | Admitting: Physician Assistant

## 2018-04-13 ENCOUNTER — Telehealth: Payer: Self-pay | Admitting: *Deleted

## 2018-04-13 ENCOUNTER — Ambulatory Visit (INDEPENDENT_AMBULATORY_CARE_PROVIDER_SITE_OTHER): Payer: Medicare HMO | Admitting: Physician Assistant

## 2018-04-13 VITALS — BP 130/70 | HR 66 | Ht 64.0 in | Wt 164.8 lb

## 2018-04-13 DIAGNOSIS — I712 Thoracic aortic aneurysm, without rupture, unspecified: Secondary | ICD-10-CM

## 2018-04-13 DIAGNOSIS — I2510 Atherosclerotic heart disease of native coronary artery without angina pectoris: Secondary | ICD-10-CM

## 2018-04-13 DIAGNOSIS — D649 Anemia, unspecified: Secondary | ICD-10-CM

## 2018-04-13 DIAGNOSIS — I480 Paroxysmal atrial fibrillation: Secondary | ICD-10-CM

## 2018-04-13 DIAGNOSIS — N183 Chronic kidney disease, stage 3 unspecified: Secondary | ICD-10-CM

## 2018-04-13 DIAGNOSIS — I5032 Chronic diastolic (congestive) heart failure: Secondary | ICD-10-CM | POA: Diagnosis not present

## 2018-04-13 LAB — CBC
HEMOGLOBIN: 10.4 g/dL — AB (ref 11.1–15.9)
Hematocrit: 31.8 % — ABNORMAL LOW (ref 34.0–46.6)
MCH: 28.3 pg (ref 26.6–33.0)
MCHC: 32.7 g/dL (ref 31.5–35.7)
MCV: 86 fL (ref 79–97)
Platelets: 155 10*3/uL (ref 150–450)
RBC: 3.68 x10E6/uL — ABNORMAL LOW (ref 3.77–5.28)
RDW: 13.6 % (ref 12.3–15.4)
WBC: 4.7 10*3/uL (ref 3.4–10.8)

## 2018-04-13 LAB — BASIC METABOLIC PANEL
BUN/Creatinine Ratio: 11 — ABNORMAL LOW (ref 12–28)
BUN: 21 mg/dL (ref 8–27)
CALCIUM: 9.1 mg/dL (ref 8.7–10.3)
CHLORIDE: 107 mmol/L — AB (ref 96–106)
CO2: 21 mmol/L (ref 20–29)
Creatinine, Ser: 1.95 mg/dL — ABNORMAL HIGH (ref 0.57–1.00)
GFR, EST AFRICAN AMERICAN: 30 mL/min/{1.73_m2} — AB (ref >59)
GFR, EST NON AFRICAN AMERICAN: 26 mL/min/{1.73_m2} — AB (ref >59)
Glucose: 81 mg/dL (ref 65–99)
POTASSIUM: 4.6 mmol/L (ref 3.5–5.2)
Sodium: 141 mmol/L (ref 134–144)

## 2018-04-13 MED ORDER — BACLOFEN 5 MG PO TABS: 5.0000 mg | ORAL_TABLET | Freq: Two times a day (BID) | ORAL | 0 refills | Status: DC | PRN

## 2018-04-13 MED ORDER — FUROSEMIDE 40 MG PO TABS: 20.0000 mg | ORAL_TABLET | Freq: Every day | ORAL | 3 refills | Status: DC

## 2018-04-13 MED ORDER — POTASSIUM CHLORIDE CRYS ER 20 MEQ PO TBCR: 10.0000 meq | EXTENDED_RELEASE_TABLET | Freq: Every day | ORAL | 3 refills | Status: DC

## 2018-04-13 MED ORDER — APIXABAN 5 MG PO TABS: 5.0000 mg | ORAL_TABLET | Freq: Two times a day (BID) | ORAL | 3 refills | Status: DC

## 2018-04-13 NOTE — Telephone Encounter (Signed)
Pt has been advised of lab results and recommendations. Pt is agreeable to d/c ASA and Eliquis, pt will decrease lasix to 20 mg daily, decrease K+ to 10 meq daily. BMET 8/20, Hgb/HCT 9/10. Pt thanked me for the call and my help.

## 2018-04-13 NOTE — Telephone Encounter (Signed)
-----   Message from Liliane Shi, PA-C sent at 04/13/2018  3:57 PM EDT ----- Creatinine increased.  The potassium is normal.  The hemoglobin is improved.  - Decrease Lasix to 20 mg QD  - Decrease K+ to 10 mEq QD  - Stop ASA  - Start back on Apixaban 5 mg bid  - BMET 1 week  - Hgb/Hct in 4 weeks Richardson Dopp, PA-C    04/13/2018 3:56 PM

## 2018-04-13 NOTE — Telephone Encounter (Signed)
Pt LVM on nurse line requesting a refill on baclofen. You do not have any apts Friday or next week. Please advise.

## 2018-04-13 NOTE — Addendum Note (Signed)
Addended by: Michae Kava on: 04/13/2018 04:52 PM   Modules accepted: Orders

## 2018-04-13 NOTE — Patient Instructions (Signed)
Medication Instructions:  1. Your physician recommends that you continue on your current medications as directed. Please refer to the Current Medication list given to you today.   Labwork: TODAY BMET, CBC   Testing/Procedures: NONE ORDERED TODAY  Follow-Up: Richardson Dopp, [facility] 07/14/18 @ 9:15 AM   Any Other Special Instructions Will Be Listed Below (If Applicable).     If you need a refill on your cardiac medications before your next appointment, please call your pharmacy.

## 2018-04-13 NOTE — Telephone Encounter (Signed)
CORRECTION: pt aware to restart Eliquis 5 mg BID, new Rx has been sent in. Pt thanked me for the call.

## 2018-04-20 ENCOUNTER — Other Ambulatory Visit: Payer: Medicare HMO

## 2018-04-20 DIAGNOSIS — N183 Chronic kidney disease, stage 3 unspecified: Secondary | ICD-10-CM

## 2018-04-20 DIAGNOSIS — I5032 Chronic diastolic (congestive) heart failure: Secondary | ICD-10-CM

## 2018-04-20 LAB — BASIC METABOLIC PANEL
BUN / CREAT RATIO: 14 (ref 12–28)
BUN: 23 mg/dL (ref 8–27)
CALCIUM: 8.6 mg/dL — AB (ref 8.7–10.3)
CHLORIDE: 105 mmol/L (ref 96–106)
CO2: 20 mmol/L (ref 20–29)
Creatinine, Ser: 1.67 mg/dL — ABNORMAL HIGH (ref 0.57–1.00)
GFR calc non Af Amer: 31 mL/min/{1.73_m2} — ABNORMAL LOW (ref >59)
GFR, EST AFRICAN AMERICAN: 35 mL/min/{1.73_m2} — AB (ref >59)
Glucose: 93 mg/dL (ref 65–99)
POTASSIUM: 4.2 mmol/L (ref 3.5–5.2)
Sodium: 139 mmol/L (ref 134–144)

## 2018-04-21 ENCOUNTER — Telehealth: Payer: Self-pay | Admitting: *Deleted

## 2018-04-21 DIAGNOSIS — N183 Chronic kidney disease, stage 3 unspecified: Secondary | ICD-10-CM

## 2018-04-21 DIAGNOSIS — I5032 Chronic diastolic (congestive) heart failure: Secondary | ICD-10-CM

## 2018-04-21 NOTE — Telephone Encounter (Signed)
-----   Message from Burtis Junes, NP sent at 04/20/2018  9:05 PM EDT ----- Reviewed for Richardson Dopp, PA Ok to report. BMET shows improving kidney function.  Please add repeat BMET to the next H&H (please order under [place])

## 2018-04-21 NOTE — Telephone Encounter (Signed)
Pt has been notified of lab results by phone with verbal understanding. BMET will be added to 05/11/18 lab work. Pt asked for her K+ to be sent in for a refill. Pt thanked me for the call.

## 2018-05-11 ENCOUNTER — Other Ambulatory Visit: Payer: Medicare HMO

## 2018-05-11 DIAGNOSIS — N183 Chronic kidney disease, stage 3 unspecified: Secondary | ICD-10-CM

## 2018-05-11 DIAGNOSIS — I5032 Chronic diastolic (congestive) heart failure: Secondary | ICD-10-CM

## 2018-05-11 LAB — HEMOGLOBIN AND HEMATOCRIT, BLOOD
HEMOGLOBIN: 10.8 g/dL — AB (ref 11.1–15.9)
Hematocrit: 30.2 % — ABNORMAL LOW (ref 34.0–46.6)

## 2018-05-11 LAB — BASIC METABOLIC PANEL
BUN/Creatinine Ratio: 13 (ref 12–28)
BUN: 25 mg/dL (ref 8–27)
CHLORIDE: 106 mmol/L (ref 96–106)
CO2: 20 mmol/L (ref 20–29)
Calcium: 9.1 mg/dL (ref 8.7–10.3)
Creatinine, Ser: 1.91 mg/dL — ABNORMAL HIGH (ref 0.57–1.00)
GFR calc Af Amer: 30 mL/min/{1.73_m2} — ABNORMAL LOW (ref >59)
GFR calc non Af Amer: 26 mL/min/{1.73_m2} — ABNORMAL LOW (ref >59)
GLUCOSE: 80 mg/dL (ref 65–99)
POTASSIUM: 4.6 mmol/L (ref 3.5–5.2)
Sodium: 141 mmol/L (ref 134–144)

## 2018-05-12 ENCOUNTER — Telehealth: Payer: Self-pay | Admitting: *Deleted

## 2018-05-12 DIAGNOSIS — N183 Chronic kidney disease, stage 3 unspecified: Secondary | ICD-10-CM

## 2018-05-12 DIAGNOSIS — I5032 Chronic diastolic (congestive) heart failure: Secondary | ICD-10-CM

## 2018-05-12 DIAGNOSIS — D649 Anemia, unspecified: Secondary | ICD-10-CM

## 2018-05-12 NOTE — Telephone Encounter (Signed)
-----   Message from Liliane Shi, [place] sent at 05/12/2018  4:58 PM EDT ----- The following labs are stable without significant clinical change:  Hemoglobin and Creatinine.  All other results are normal or within acceptable limits. Medication changes / Follow up labs / Other changes or recommendations:    - Continue current medications and follow up as planned.   - Repeat BMET, Hgb/Hct in 3 weeks Richardson Dopp, PA-C 05/12/2018 4:57 PM

## 2018-05-12 NOTE — Telephone Encounter (Signed)
Pt has been notified of lab results by phone with verbal understanding. Pt agreeable to repeat labs BMET, HGB/HCT to be done in 3 weeks 06/02/18. Pt thanked me for the call.

## 2018-05-20 ENCOUNTER — Telehealth: Payer: Self-pay | Admitting: Physician Assistant

## 2018-05-20 DIAGNOSIS — I712 Thoracic aortic aneurysm, without rupture, unspecified: Secondary | ICD-10-CM

## 2018-05-20 NOTE — Telephone Encounter (Signed)
The patient's kidney function will not allow [place] to do a CT with contrast. I reviewed the case with Dr. Meda Coffee.  MRI of the chest without contrast should give enough info to survey the aneurysm. PLAN:  1. Cancel Chest CTA 2. Schedule MRI of the Chest without contrast. Richardson Dopp, PA-C    05/20/2018 1:26 PM

## 2018-05-21 NOTE — Telephone Encounter (Signed)
I s/w pt today and explained that we needed to cancel the Chest CT due to her kidney function. Explained that we will change the order over to a MRI of the chest w/o contrast. I explained to the pt that [facility] will call her and schedule her for the MRI. Pt is agreeable to plan of care and thanked me for the call. Order for MRI of the chest w/o contrast has been placed today.

## 2018-05-25 ENCOUNTER — Inpatient Hospital Stay: Admission: RE | Admit: 2018-05-25 | Payer: Medicare HMO | Source: Ambulatory Visit

## 2018-05-27 ENCOUNTER — Ambulatory Visit (HOSPITAL_COMMUNITY): Admission: RE | Admit: 2018-05-27 | Payer: Medicare HMO | Source: Ambulatory Visit

## 2018-06-02 ENCOUNTER — Other Ambulatory Visit: Payer: Medicare HMO

## 2018-06-04 NOTE — Telephone Encounter (Signed)
She did not have repeat H/H this week as planned. Please make sure she comes in next week to have it done.  If she had it done somewhere else, please request results. Richardson Dopp, PA-C    06/04/2018 2:01 PM

## 2018-06-08 NOTE — Telephone Encounter (Signed)
I tried to reach the pt in regards to she missed her lab appt on 06/02/18. Called to see if she had lab work done elsewhere or to reschedule in our office. No answer or vm pick up.

## 2018-06-09 ENCOUNTER — Other Ambulatory Visit: Payer: Medicare HMO | Admitting: *Deleted

## 2018-06-09 ENCOUNTER — Telehealth: Payer: Self-pay | Admitting: *Deleted

## 2018-06-09 DIAGNOSIS — I5032 Chronic diastolic (congestive) heart failure: Secondary | ICD-10-CM

## 2018-06-09 DIAGNOSIS — N183 Chronic kidney disease, stage 3 unspecified: Secondary | ICD-10-CM

## 2018-06-09 DIAGNOSIS — D649 Anemia, unspecified: Secondary | ICD-10-CM

## 2018-06-09 NOTE — Telephone Encounter (Signed)
I called pt about missed lab appt. Pt states she forgot all about the lab appt on 10/2 and she will come in today for her lab work. Pt thanked me for the call.

## 2018-06-09 NOTE — Telephone Encounter (Signed)
-----   Message from Liliane Shi, [place] sent at 06/08/2018  6:10 PM EDT ----- Please make sure Ms. Cl[place]addle has a follow up Hgb/Hct as noted on 05/12/18 results. Richardson Dopp, PA-C    06/08/2018 6:10 PM

## 2018-06-10 ENCOUNTER — Telehealth: Payer: Self-pay | Admitting: *Deleted

## 2018-06-10 LAB — BASIC METABOLIC PANEL
BUN/Creatinine Ratio: 14 (ref 12–28)
BUN: 28 mg/dL — AB (ref 8–27)
CO2: 22 mmol/L (ref 20–29)
Calcium: 8.6 mg/dL — ABNORMAL LOW (ref 8.7–10.3)
Chloride: 105 mmol/L (ref 96–106)
Creatinine, Ser: 1.99 mg/dL — ABNORMAL HIGH (ref 0.57–1.00)
GFR calc Af Amer: 29 mL/min/{1.73_m2} — ABNORMAL LOW (ref >59)
GFR calc non Af Amer: 25 mL/min/{1.73_m2} — ABNORMAL LOW (ref >59)
GLUCOSE: 115 mg/dL — AB (ref 65–99)
Potassium: 4.5 mmol/L (ref 3.5–5.2)
Sodium: 141 mmol/L (ref 134–144)

## 2018-06-10 LAB — HEMOGLOBIN AND HEMATOCRIT, BLOOD
HEMATOCRIT: 32.9 % — AB (ref 34.0–46.6)
HEMOGLOBIN: 11.1 g/dL (ref 11.1–15.9)

## 2018-06-10 NOTE — Telephone Encounter (Signed)
No answer. Tried to reach the pt to go over lab results.

## 2018-06-10 NOTE — Telephone Encounter (Signed)
-----   Message from Burtis Junes, NP sent at 06/10/2018  2:14 PM EDT ----- Ok to report. Reviewed for Richardson Dopp, PA Her labs are fairly stable. Would stay on current regimen. She has follow up with Nicki Reaper in a few weeks - would plan to repeat again at that time.

## 2018-06-11 ENCOUNTER — Telehealth: Payer: Self-pay | Admitting: *Deleted

## 2018-06-11 NOTE — Telephone Encounter (Signed)
-----   Message from Burtis Junes, NP sent at 06/10/2018  2:14 PM EDT ----- Ok to report. Reviewed for Richardson Dopp, PA Her labs are fairly stable. Would stay on current regimen. She has follow up with Nicki Reaper in a few weeks - would plan to repeat again at that time.

## 2018-06-11 NOTE — Telephone Encounter (Signed)
Tried to reach pt to go over lab results. Line busy today.

## 2018-06-14 ENCOUNTER — Other Ambulatory Visit: Payer: Self-pay | Admitting: Internal Medicine

## 2018-06-14 NOTE — Telephone Encounter (Signed)
Review for refills please.

## 2018-06-14 NOTE — Telephone Encounter (Signed)
Pt has been notified of lab results by phone with verbal understanding. Pt thanked me for the call.   

## 2018-06-14 NOTE — Telephone Encounter (Signed)
-----   Message from Burtis Junes, NP sent at 06/10/2018  2:14 PM EDT ----- Ok to report. Reviewed for Richardson Dopp, PA Her labs are fairly stable. Would stay on current regimen. She has follow up with Nicki Reaper in a few weeks - would plan to repeat again at that time.

## 2018-06-17 NOTE — Telephone Encounter (Signed)
Please review for refill has seen Richardson Dopp.

## 2018-06-27 ENCOUNTER — Other Ambulatory Visit: Payer: Self-pay | Admitting: Internal Medicine

## 2018-06-28 NOTE — Telephone Encounter (Signed)
Please review for refill.  

## 2018-06-29 ENCOUNTER — Telehealth: Payer: Self-pay | Admitting: Physician Assistant

## 2018-06-29 MED ORDER — POTASSIUM CHLORIDE CRYS ER 20 MEQ PO TBCR: 10.0000 meq | EXTENDED_RELEASE_TABLET | Freq: Every day | ORAL | 3 refills | Status: DC

## 2018-06-29 NOTE — Telephone Encounter (Signed)
Patient walk-in : Patient stated her potassium RX was denied. Patient wants to know why. Please call patient.

## 2018-06-29 NOTE — Telephone Encounter (Signed)
Pt walked in today to see why her Rx for her K+ was denied. I looked into this and looks like Rx defaulted onto no print. I have resent K+ Rx in for the pt. I tried to call the pt back,though no answer. I will try again later to reach the pt and let her know Rx has been sent in.

## 2018-06-29 NOTE — Telephone Encounter (Signed)
S/w pt's husband [name] and let him know the Rx for the K+ has been sent in. I apologized for any problems this may have caused for the pt. Pt's husband thanked me for the call and will let the pt know she can pick up her K+.

## 2018-07-05 ENCOUNTER — Other Ambulatory Visit: Payer: Self-pay | Admitting: Family Medicine

## 2018-07-14 ENCOUNTER — Ambulatory Visit (INDEPENDENT_AMBULATORY_CARE_PROVIDER_SITE_OTHER): Payer: Medicare HMO | Admitting: Physician Assistant

## 2018-07-14 ENCOUNTER — Encounter: Payer: Self-pay | Admitting: Physician Assistant

## 2018-07-14 VITALS — BP 142/82 | HR 55 | Ht 64.0 in | Wt 173.8 lb

## 2018-07-14 DIAGNOSIS — I2510 Atherosclerotic heart disease of native coronary artery without angina pectoris: Secondary | ICD-10-CM | POA: Diagnosis not present

## 2018-07-14 DIAGNOSIS — I5032 Chronic diastolic (congestive) heart failure: Secondary | ICD-10-CM | POA: Diagnosis not present

## 2018-07-14 DIAGNOSIS — I712 Thoracic aortic aneurysm, without rupture, unspecified: Secondary | ICD-10-CM

## 2018-07-14 DIAGNOSIS — N183 Chronic kidney disease, stage 3 unspecified: Secondary | ICD-10-CM

## 2018-07-14 DIAGNOSIS — D649 Anemia, unspecified: Secondary | ICD-10-CM

## 2018-07-14 DIAGNOSIS — I480 Paroxysmal atrial fibrillation: Secondary | ICD-10-CM

## 2018-07-14 LAB — BASIC METABOLIC PANEL
BUN/Creatinine Ratio: 14 (ref 12–28)
BUN: 25 mg/dL (ref 8–27)
CO2: 21 mmol/L (ref 20–29)
CREATININE: 1.82 mg/dL — AB (ref 0.57–1.00)
Calcium: 8.8 mg/dL (ref 8.7–10.3)
Chloride: 109 mmol/L — ABNORMAL HIGH (ref 96–106)
GFR calc Af Amer: 32 mL/min/{1.73_m2} — ABNORMAL LOW (ref >59)
GFR calc non Af Amer: 28 mL/min/{1.73_m2} — ABNORMAL LOW (ref >59)
GLUCOSE: 75 mg/dL (ref 65–99)
POTASSIUM: 4.8 mmol/L (ref 3.5–5.2)
SODIUM: 143 mmol/L (ref 134–144)

## 2018-07-14 LAB — CBC
Hematocrit: 32.4 % — ABNORMAL LOW (ref 34.0–46.6)
Hemoglobin: 10.8 g/dL — ABNORMAL LOW (ref 11.1–15.9)
MCH: 29 pg (ref 26.6–33.0)
MCHC: 33.3 g/dL (ref 31.5–35.7)
MCV: 87 fL (ref 79–97)
Platelets: 159 10*3/uL (ref 150–450)
RBC: 3.73 x10E6/uL — ABNORMAL LOW (ref 3.77–5.28)
RDW: 12.2 % — ABNORMAL LOW (ref 12.3–15.4)
WBC: 4.8 10*3/uL (ref 3.4–10.8)

## 2018-07-14 NOTE — Patient Instructions (Addendum)
Medication Instructions:  Your physician has recommended you make the following change in your medication:  1. RESTART AMLODIPINE 5 MG DAILY.   If you need a refill on your cardiac medications before your next appointment, please call your pharmacy.   Lab work: Today - BMET, CBC  If you have labs (blood work) drawn today and your tests are completely normal, you will receive your results only by: [name] MyChart Message (if you have MyChart) OR . A paper copy in the mail If you have any lab test that is abnormal or we need to change your treatment, we will call you to review the results.  Testing/Procedures: ORDER ALREADY PLACED IN Epic FOR MRI OF CHEST WITHOUT CONTRAST.   Follow-Up: At [facility], you and your health needs are our priority.  As part of our continuing mission to provide you with exceptional heart care, we have created designated Provider Care Teams.  These Care Teams include your primary Cardiologist (physician) and Advanced Practice Providers (APPs -  Physician Assistants and Nurse Practitioners) who all work together to provide you with the care you need, when you need it. You will need a follow up appointment in:  6 months with Jennifer Dopp, PA-C .  Please call our office 2 months in advance to schedule this appointment.     Any Other Special Instructions Will Be Listed Below (If Applicable).

## 2018-07-14 NOTE — Progress Notes (Signed)
Cardiology Office Note:    Date:  07/14/2018   ID:  Jennifer Foster, DOB August 24, 1946, MRN [id number]  PCP:  [name], MD  Cardiologist:  Mertie Moores, MD / Richardson Dopp, PA-C  Electrophysiologist:  None  Nephrologist: Dr. Posey Pronto  Referring MD: [name], MD   Chief Complaint  Patient presents with  . Follow-up    CHF, A. fib, CAD    History of Present Illness:    Jennifer Foster is a 72 y.o. female with paroxysmal atrial fibrillation, hypertension, chronic anemia (chronic disease) and remote GI bleed, CKD stage 3, and prior substance abusewithcocaine, anemia. Stress testing in March 2019 demonstrated partially reversible anterior defect concerning for ischemia.Cardiac Catheterizationdemonstrated single-vessel CAD with mid RCA 60% and focal 60-70% distal RCA. There was mild to moderate disease involving a large tortuous LAD and LCx.Medical therapy was recommended. Last seen in 8/19.    Jennifer Foster returns for follow-up.  She is somewhat upset today as she did see a patient code out in the parking lot.  She has not had any chest discomfort or significant shortness of breath.  She denies orthopnea, PND or significant leg swelling.  Her blood pressures at home run high.  She does admit to a diet high in salt.  Prior CV studies:   The following studies were reviewed today:  Cardiac catheterization 12/03/2017 LAD mild disease LCx proximal 35; OM230 RCA mid 60, distal 65 LVEDP 15-20  Nuclear stress test 11/12/2017 Medium size, moderate severity partially reversible anterior perfusion defect suggestive of ischemia. There is also a smaller, basal to mid inferior perfusion defect which is fixed, suggestive of artifact or scar. LVEF 50% with inferior hypokinesis. This is an intermediate risk study. Clinical correlation is recommended.  Echo 10/22/2017 Moderate LVH, EF 60-73, grade 2 diastolic dysfunction, trivial MR, moderate to severe LAE, PASP 30  Chest CT w/  contrast 04/03/17 IMPRESSION: 1. No suspicious pulmonary nodule or mass. 2. Atherosclerotic and mildly aneurysmal ascending thoracic aorta with a maximal diameter of 4.1 cm.Recommend annual imaging followup by CTA or MRA. This recommendation follows 2010 ACCF/AHA/AATS/ACR/ASA/SCA/SCAI/SIR/STS/SVM Guidelines for the Diagnosis and Management of Patients with Thoracic Aortic Disease. Circulation. 2010; 121: X106-Y694 3. Coronary artery calcifications 4. Mild centrilobular pulmonary emphysema. 5. Multinodular thyroid goiter. 6. Left adrenal thickening likely representing hyperplasia and 1.9 cm right adrenal adenoma. Aortic Atherosclerosis(ICD10-170.0); Aortic Atherosclerosis (ICD10-I70.0) and Emphysema (ICD10-J43.9).  Holter 7/18 NSR with PAC's Brief short SVT Overall, unremarkable  Nuclear stress test 11/23/2014 Normal stress nuclear study.overall low risk with no evidence of significant ischemia identified. Left ventricular hypertrophy suggested by increased wall thickness seen on perfusion images. LV Ejection Fraction: 56%.  Echo 03/16/2013 Severe LVH, EF 55-60, mild MR, mild to moderate LAE, PASP 33  Past Medical History:  Diagnosis Date  . Acute blood loss anemia 06/26/2015  . Aortic atherosclerosis ([place]) 01/01/2018   CT 04/2017  . Asthma   . Atrial fibrillation ([place]) 10/13/2017   Newly diagnosed in Jan 2019 // Apixaban for anticoag // Apixaban held in 2019 for anemia but resumed; Hgb stable since restarting  . CAD (coronary artery disease) 11/27/2017   Nuc stress 3/19: anterior ischemia, ?inf scar, EF 50, intermediate risk // LHC 4/19: LAD mild dz, pLCx 35, OM2 30, mRCA 60, dRCA 65, LVEDP 15-20  . Chronic diastolic CHF (congestive heart failure) ([place]) 03/23/2018   Echo 2/19: Moderate LVH, EF 85-46, grade 2 diastolic dysfunction, trivial MR, moderate to severe LAE, PASP 30  . Chronic kidney  disease (CKD), stage III (moderate) ([place]) 06/27/2015  . DIVERTICULAR BLEEDING, HX OF  10/14/2007   Colonoscopy 2008 showed diverticulosis.   . Dizziness 11/25/2016  . GERD (gastroesophageal reflux disease)   . Gout   . History of anemia   . History of asthma   . Hyperlipemia   . Hypertension   . Joint pain   . Lipoma   . Thoracic aortic aneurysm without rupture ([place]) 04/03/2017   CT 8/18: ascending thoracic aorta 4.1 cm // unable to do CTA due to CKD >> MRI 11/19 pending   Surgical Hx: The patient  has a past surgical history that includes Abdominal hysterectomy (1981); Lipoma excision (01/28/11); Colonoscopy (N/A, 12/08/2013); Esophagogastroduodenoscopy (N/A, 12/08/2013); Givens capsule study (N/A, 12/08/2013); Colonoscopy (N/A, 06/28/2015); and LEFT HEART CATH AND CORONARY ANGIOGRAPHY (N/A, 12/03/2017).   Current Medications: Current Meds  Medication Sig  . albuterol (PROVENTIL HFA;VENTOLIN HFA) 108 (90 Base) MCG/ACT inhaler Inhale 2 puffs into the lungs every 6 (six) hours as needed for wheezing or shortness of breath.  [name] apixaban (ELIQUIS) 5 MG TABS tablet Take 1 tablet (5 mg total) by mouth 2 (two) times daily.  . Baclofen 5 MG TABS TAKE 1 TABLET BY MOUTH TWICE A DAY IF NEEDED FOR MUSCLE SPASM  . furosemide (LASIX) 40 MG tablet Take 0.5 tablets (20 mg total) by mouth daily.  . irbesartan (AVAPRO) 150 MG tablet Take 150 mg by mouth daily.  . metoprolol succinate (TOPROL-XL) 25 MG 24 hr tablet Take 1 tablet (25 mg total) by mouth daily.  . nitroGLYCERIN (NITROSTAT) 0.4 MG SL tablet Place 1 tablet (0.4 mg total) under the tongue every 5 (five) minutes as needed for chest pain. If do not resolve after 1 tablet, go to ED  . potassium chloride SA (KLOR-CON M20) 20 MEQ tablet Take 0.5 tablets (10 mEq total) by mouth daily.  . rosuvastatin (CRESTOR) 20 MG tablet TAKE 1 TABLET BY MOUTH AT BEDTIME     Allergies:   Ace inhibitors; Lisinopril; and Tramadol   Social History   Tobacco Use  . Smoking status: Former Smoker    Packs/day: 0.25    Types: Cigarettes  . Smokeless tobacco:  Never Used  Substance Use Topics  . Alcohol use: Yes    Alcohol/week: 0.0 standard drinks    Comment: occasionally  . Drug use: No     Family Hx: The patient's family history includes Diabetes type II in her unknown relative; Kidney failure in her son and unknown relative.  ROS:   Please see the history of present illness.    ROS All other systems reviewed and are negative.   EKGs/Labs/Other Test Reviewed:    EKG:  EKG is  ordered today.  The ekg ordered today demonstrates normal sinus rhythm, heart rate 55, left axis deviation, LVH, T wave inversions V5-V6, QTC 432, PACs, similar to prior tracing  Recent Labs: 02/24/2018: ALT 9; Magnesium 1.7 03/19/2018: NT-Pro BNP 17,931 04/13/2018: Platelets 155 06/09/2018: BUN 28; Creatinine, Ser 1.99; Hemoglobin 11.1; Potassium 4.5; Sodium 141   Recent Lipid Panel Lab Results  Component Value Date/Time   CHOL 149 08/07/2017 10:36 AM   TRIG 88 08/07/2017 10:36 AM   HDL 57 08/07/2017 10:36 AM   CHOLHDL 2.6 08/07/2017 10:36 AM   CHOLHDL 2.3 03/18/2016 11:20 AM   LDLCALC 74 08/07/2017 10:36 AM   LDLDIRECT 88 07/16/2011 11:38 AM    Physical Exam:    VS:  BP (!) 142/82   Pulse (!) 55   Ht  5\' 4"  (1.626 m)   Wt 173 lb 12.8 oz (78.8 kg)   BMI 29.83 kg/m     Wt Readings from Last 3 Encounters:  07/14/18 173 lb 12.8 oz (78.8 kg)  04/13/18 164 lb 12.8 oz (74.8 kg)  03/25/18 169 lb (76.7 kg)     Physical Exam  Constitutional: She is oriented to person, place, and time. She appears well-developed and well-nourished. No distress.  HENT:  Head: Normocephalic and atraumatic.  Eyes: No scleral icterus.  Neck: No JVD present. No thyromegaly present.  Cardiovascular: Normal rate and regular rhythm.  No murmur heard. Pulmonary/Chest: Effort normal. She has no rales.  Abdominal: Soft.  Musculoskeletal: She exhibits no edema.  Lymphadenopathy:    She has no cervical adenopathy.  Neurological: She is alert and oriented to person, place, and  time.  Skin: Skin is warm and dry.  Psychiatric: She has a normal mood and affect.    ASSESSMENT & PLAN:    Chronic diastolic CHF (congestive heart failure) (HCC) Overall, volume appears stable.  She is NYHA 2.  We discussed the importance of good blood pressure control.  Continue current regimen.  Given Dr. Darnelle Bos move to [place], I will follow her going forward along with Dr. Acie Fredrickson.   Coronary artery disease involving native coronary artery of native heart without angina pectoris Cardiac catheterization in 4/19 with nonobstructive coronary artery disease.  She is not on aspirin as she is on Apixaban.  Continue rosuvastatin.  Paroxysmal atrial fibrillation (HCC) Maintaining normal sinus rhythm.  Her weight is above 60 kg and she is less than 11 years old.  Continue apixaban 5 mg twice daily.  Obtain follow-up BMET, CBC today.  Thoracic aortic aneurysm without rupture ([place]) She is unable to undergo CTA due to her chronic kidney disease.  We did have an MRI scheduled but she did not show for the appointment.  I will reorder her chest MRI without contrast.  Chronic kidney disease, stage 3 ([place]) She is followed by Dr. Posey Pronto.  Obtain follow-up BMET today.  Chronic anemia   Hemoglobin has been stable since we resumed her Apixaban.  Repeat CBC again today.  Essential hypertension Blood pressure above target.  She stopped amlodipine for unclear reasons.  I have asked her to resume amlodipine 5 mg daily.  Continue metoprolol succinate and irbesartan.   Dispo:  Return in about 6 months (around 01/12/2019) for Routine Follow Up, w/ Richardson Dopp, PA-C.   Medication Adjustments/Labs and Tests Ordered: Current medicines are reviewed at length with the patient today.  Concerns regarding medicines are outlined above.  Tests Ordered: Orders Placed This Encounter  Procedures  . Basic metabolic panel  . CBC  . EKG 12-Lead   Medication Changes: No orders of the defined types were placed in  this encounter.   Signed, Richardson Dopp, PA-C  07/14/2018 12:09 PM    [facility] [place], [place], [place]  [address] Phone: [phone]; Fax: [phone]

## 2018-08-27 ENCOUNTER — Ambulatory Visit (HOSPITAL_COMMUNITY)
Admission: EM | Admit: 2018-08-27 | Discharge: 2018-08-27 | Disposition: A | Discharge status: Home / Self Care | Payer: Medicare HMO | Attending: Family Medicine | Admitting: Family Medicine

## 2018-08-27 ENCOUNTER — Encounter (HOSPITAL_COMMUNITY): Payer: Self-pay | Admitting: Emergency Medicine

## 2018-08-27 DIAGNOSIS — R0602 Shortness of breath: Secondary | ICD-10-CM

## 2018-08-27 DIAGNOSIS — F17210 Nicotine dependence, cigarettes, uncomplicated: Secondary | ICD-10-CM

## 2018-08-27 DIAGNOSIS — R509 Fever, unspecified: Secondary | ICD-10-CM | POA: Diagnosis not present

## 2018-08-27 DIAGNOSIS — M791 Myalgia, unspecified site: Secondary | ICD-10-CM

## 2018-08-27 DIAGNOSIS — R062 Wheezing: Secondary | ICD-10-CM | POA: Diagnosis not present

## 2018-08-27 DIAGNOSIS — Z720 Tobacco use: Secondary | ICD-10-CM

## 2018-08-27 DIAGNOSIS — I10 Essential (primary) hypertension: Secondary | ICD-10-CM

## 2018-08-27 DIAGNOSIS — J441 Chronic obstructive pulmonary disease with (acute) exacerbation: Secondary | ICD-10-CM | POA: Insufficient documentation

## 2018-08-27 DIAGNOSIS — R51 Headache: Secondary | ICD-10-CM

## 2018-08-27 DIAGNOSIS — R05 Cough: Secondary | ICD-10-CM

## 2018-08-27 DIAGNOSIS — J111 Influenza due to unidentified influenza virus with other respiratory manifestations: Secondary | ICD-10-CM

## 2018-08-27 MED ORDER — IPRATROPIUM-ALBUTEROL 0.5-2.5 (3) MG/3ML IN SOLN
3.0000 mL | Freq: Once | RESPIRATORY_TRACT | Status: AC
  Administered 2018-08-27: 3 mL via RESPIRATORY_TRACT

## 2018-08-27 MED ORDER — OSELTAMIVIR PHOSPHATE 75 MG PO CAPS: 75.0000 mg | ORAL_CAPSULE | Freq: Two times a day (BID) | ORAL | 0 refills | Status: DC

## 2018-08-27 MED ORDER — IPRATROPIUM-ALBUTEROL 0.5-2.5 (3) MG/3ML IN SOLN
RESPIRATORY_TRACT | Status: AC
  Filled 2018-08-27: qty 3

## 2018-08-27 MED ORDER — IPRATROPIUM-ALBUTEROL 0.5-2.5 (3) MG/3ML IN SOLN: 3.0000 mL | RESPIRATORY_TRACT | 0 refills | Status: DC | PRN

## 2018-08-27 NOTE — ED Triage Notes (Signed)
Pt here with flu like sx starting last night

## 2018-08-27 NOTE — Discharge Instructions (Signed)
Take Tamiflu 2 times a day as instructed Use the nebulizer every 4 hours for wheezing or shortness of breath Drink lots of fluids Use Tylenol or ibuprofen for pain or fever Call your PCP today for follow-up early next week Return to ER if worse instead of better

## 2018-08-27 NOTE — ED Provider Notes (Signed)
[facility]    CSN: [id number] Arrival date & time: 08/27/18  0102     History   Chief Complaint Chief Complaint  Patient presents with  . Influenza    HPI Jennifer Foster is a 72 y.o. female.   HPI  Patient states that yesterday she had a sudden onset of body aches, sweats and chills, severe fatigue, coughing and shortness of breath.  She is here today for evaluation.  Nausea or vomiting.  No runny or stuffy nose.  Unclear exposure to strep strep or flu.  Patient has not had a flu shot this year. She has multiple other medical problems.  Longstanding smoker.  Known heart disease.  Denies chest pain.  Past Medical History:  Diagnosis Date  . Acute blood loss anemia 06/26/2015  . Aortic atherosclerosis ([place]) 01/01/2018   CT 04/2017  . Asthma   . Atrial fibrillation ([place]) 10/13/2017   Newly diagnosed in Jan 2019 // Apixaban for anticoag // Apixaban held in 2019 for anemia but resumed; Hgb stable since restarting  . CAD (coronary artery disease) 11/27/2017   Nuc stress 3/19: anterior ischemia, ?inf scar, EF 50, intermediate risk // LHC 4/19: LAD mild dz, pLCx 35, OM2 30, mRCA 60, dRCA 65, LVEDP 15-20  . Chronic diastolic CHF (congestive heart failure) ([place]) 03/23/2018   Echo 2/19: Moderate LVH, EF 72-53, grade 2 diastolic dysfunction, trivial MR, moderate to severe LAE, PASP 30  . Chronic kidney disease (CKD), stage III (moderate) ([employer]) 06/27/2015  . DIVERTICULAR BLEEDING, HX OF 10/14/2007   Colonoscopy 2008 showed diverticulosis.   . Dizziness 11/25/2016  . GERD (gastroesophageal reflux disease)   . Gout   . History of anemia   . History of asthma   . Hyperlipemia   . Hypertension   . Joint pain   . Lipoma   . Thoracic aortic aneurysm without rupture ([place]) 04/03/2017   CT 8/18: ascending thoracic aorta 4.1 cm // unable to do CTA due to CKD >> MRI 11/19 pending    Patient Active Problem List   Diagnosis Date Noted  . Chronic diastolic CHF (congestive heart failure)  ([place]) 03/23/2018  . CAP (community acquired pneumonia) 02/21/2018  . Acute respiratory failure with hypoxia ([place]) 02/21/2018  . Aortic atherosclerosis ([place]) 01/01/2018  . Abnormal stress test 12/03/2017  . CAD (coronary artery disease) 11/27/2017  . Atrial fibrillation ([place]) 10/13/2017  . Multinodular goiter 04/14/2017  . Adrenal adenoma 04/14/2017  . Thoracic aortic aneurysm without rupture ([place]) 04/03/2017  . Irregular heart rhythm 03/06/2017  . Dermatitis 08/15/2016  . Proteinuria 01/11/2016  . Anxiety and depression 12/25/2015  . Chronic kidney disease, stage 3 ([place]) 06/27/2015  . Essential hypertension   . Cocaine abuse ([place])   . Gout 02/28/2014  . Lipoma 03/19/2011  . Hyperlipidemia 01/01/2011  . Chronic anemia 11/23/2009  . TOBACCO USER 09/03/2009  . Generalized anxiety disorder 08/13/2009    Past Surgical History:  Procedure Laterality Date  . ABDOMINAL HYSTERECTOMY  1981   partial, per pt history  . COLONOSCOPY N/A 12/08/2013   Procedure: COLONOSCOPY;  Surgeon: [name], MD;  Location: [facility];  Service: Endoscopy;  Laterality: N/A;  . COLONOSCOPY N/A 06/28/2015   Procedure: COLONOSCOPY;  Surgeon: Clarene Essex, MD;  Location[facility] ENDOSCOPY;  Service: Endoscopy;  Laterality: N/A;  . ESOPHAGOGASTRODUODENOSCOPY N/A 12/08/2013   Procedure: ESOPHAGOGASTRODUODENOSCOPY (EGD);  Surgeon: [name], MD;  Location: [facility] ENDOSCOPY;  Service: Endoscopy;  Laterality: N/A;  . GIVENS CAPSULE STUDY N/A  12/08/2013   Procedure: GIVENS CAPSULE STUDY;  Surgeon: [name], MD;  Location: [facility] ENDOSCOPY;  Service: Endoscopy;  Laterality: N/A;  . LEFT HEART CATH AND CORONARY ANGIOGRAPHY N/A 12/03/2017   Procedure: LEFT HEART CATH AND CORONARY ANGIOGRAPHY;  Surgeon: Nelva Bush, MD;  Location: [facility] CV LAB;  Service: Cardiovascular;  Laterality: N/A;  . LIPOMA EXCISION  01/28/11   neck    OB History   No obstetric history on file.      Home Medications    Prior to  Admission medications   Medication Sig Start Date End Date Taking? Authorizing Provider  albuterol (PROVENTIL HFA;VENTOLIN HFA) 108 (90 Base) MCG/ACT inhaler Inhale 2 puffs into the lungs every 6 (six) hours as needed for wheezing or shortness of breath. 02/24/18   Debbe Odea, MD  apixaban (ELIQUIS) 5 MG TABS tablet Take 1 tablet (5 mg total) by mouth 2 (two) times daily. 04/13/18   Weaver, Scott T, PA-C  Baclofen 5 MG TABS TAKE 1 TABLET BY MOUTH TWICE A DAY IF NEEDED FOR MUSCLE SPASM 07/05/18   [name], MD  furosemide (LASIX) 40 MG tablet Take 0.5 tablets (20 mg total) by mouth daily. 06/17/18   End, Harrell Gave, MD  ipratropium-albuterol (DUONEB) 0.5-2.5 (3) MG/3ML SOLN Take 3 mLs by nebulization every 4 (four) hours as needed. 08/27/18   Raylene Everts, MD  irbesartan (AVAPRO) 150 MG tablet Take 150 mg by mouth daily. 07/10/18   [provider]  metoprolol succinate (TOPROL-XL) 25 MG 24 hr tablet Take 1 tablet (25 mg total) by mouth daily. 02/25/18   Debbe Odea, MD  nitroGLYCERIN (NITROSTAT) 0.4 MG SL tablet Place 1 tablet (0.4 mg total) under the tongue every 5 (five) minutes as needed for chest pain. If do not resolve after 1 tablet, go to ED 10/13/17   Smiley Houseman, MD  oseltamivir (TAMIFLU) 75 MG capsule Take 1 capsule (75 mg total) by mouth every 12 (twelve) hours. 08/27/18   Raylene Everts, MD  potassium chloride SA (KLOR-CON M20) 20 MEQ tablet Take 0.5 tablets (10 mEq total) by mouth daily. 06/29/18 09/27/18  Richardson Dopp T, PA-C  rosuvastatin (CRESTOR) 20 MG tablet TAKE 1 TABLET BY MOUTH AT BEDTIME 01/26/18   [name], MD    Family History Family History  Problem Relation Age of Onset  . Diabetes type II Unknown   . Kidney failure Unknown   . Kidney failure Son     Social History Social History   Tobacco Use  . Smoking status: Former Smoker    Packs/day: 0.25    Types: Cigarettes  . Smokeless tobacco: Never Used  Substance Use  Topics  . Alcohol use: Yes    Alcohol/week: 0.0 standard drinks    Comment: occasionally  . Drug use: No     Allergies   Ace inhibitors; Lisinopril; and Tramadol   Review of Systems Review of Systems  Constitutional: Positive for chills, fatigue and fever.  HENT: Negative for congestion, ear pain and sore throat.   Eyes: Negative for pain and visual disturbance.  Respiratory: Positive for cough, shortness of breath and wheezing.   Cardiovascular: Negative for chest pain and palpitations.  Gastrointestinal: Negative for abdominal pain and vomiting.  Genitourinary: Negative for dysuria and hematuria.  Musculoskeletal: Positive for myalgias. Negative for arthralgias and back pain.  Skin: Negative for color change and rash.  Neurological: Positive for headaches. Negative for seizures and syncope.  All other systems reviewed and are  negative.    Physical Exam Triage Vital Signs ED Triage Vitals  Enc Vitals Group     BP 08/27/18 1048 (!) 146/64     Pulse Rate 08/27/18 1048 61     Resp 08/27/18 1048 (!) 22     Temp 08/27/18 1048 98 F (36.7 C)     Temp Source 08/27/18 1048 Oral     SpO2 08/27/18 1048 98 %     Weight --      Height --      Head Circumference --      Peak Flow --      Pain Score 08/27/18 1049 5     Pain Loc --      Pain Edu? --      Excl. in [place]? --    No data found.  Updated Vital Signs BP (!) 146/64 (BP Location: Right Arm)   Pulse 61   Temp 98 F (36.7 C) (Oral)   Resp (!) 22   SpO2 98%       Physical Exam Constitutional:      General: She is not in acute distress.    Appearance: She is well-developed and normal weight. She is ill-appearing.  HENT:     Head: Normocephalic and atraumatic.     Right Ear: Tympanic membrane and ear canal normal.     Left Ear: Tympanic membrane and ear canal normal.     Nose: Nose normal.     Mouth/Throat:     Mouth: Mucous membranes are moist.  Eyes:     Conjunctiva/sclera: Conjunctivae normal.     Pupils:  Pupils are equal, round, and reactive to light.  Neck:     Musculoskeletal: Normal range of motion and neck supple.  Cardiovascular:     Rate and Rhythm: Normal rate and regular rhythm.     Heart sounds: Normal heart sounds.  Pulmonary:     Effort: No respiratory distress.     Comments: Patient tachypneic.  Inspiratory wheeze throughout with coarse rhonchi.  After DuoNeb treatment, improvement but patient still has significant inspiratory wheeze.  She insisted on going home and not having additional treatment.  She was sent home with a nebulizer. Abdominal:     General: Abdomen is flat. There is no distension.     Palpations: Abdomen is soft.  Musculoskeletal: Normal range of motion.  Lymphadenopathy:     Cervical: No cervical adenopathy.  Skin:    General: Skin is warm and dry.  Neurological:     General: No focal deficit present.     Mental Status: She is alert. Mental status is at baseline.  Psychiatric:        Mood and Affect: Mood normal.     Comments: Quiet.  Poor eye contact.  Appears ill      UC Treatments / Results  Labs (all labs ordered are listed, but only abnormal results are displayed) Labs Reviewed - No data to display  EKG None  Radiology No results found.  Procedures Procedures (including critical care time)  Medications Ordered in UC Medications  ipratropium-albuterol (DUONEB) 0.5-2.5 (3) MG/3ML nebulizer solution 3 mL (3 mLs Nebulization Given 08/27/18 1156)    Initial Impression / Assessment and Plan / UC Course  I have reviewed the triage vital signs and the nursing notes.  Pertinent labs & imaging results that were available during my care of the patient were reviewed by me and considered in my medical decision making (see chart for details).  Explained the patient has likely influenza with respiratory illness.  Significant wheezing. Final Clinical Impressions(s) / UC Diagnoses   Final diagnoses:  Influenza with respiratory manifestation   Wheezing  Tobacco abuse disorder  COPD exacerbation ([place])     Discharge Instructions     Take Tamiflu 2 times a day as instructed Use the nebulizer every 4 hours for wheezing or shortness of breath Drink lots of fluids Use Tylenol or ibuprofen for pain or fever Call your PCP today for follow-up early next week Return to ER if worse instead of better   ED Prescriptions    Medication Sig Dispense Auth. Provider   oseltamivir (TAMIFLU) 75 MG capsule Take 1 capsule (75 mg total) by mouth every 12 (twelve) hours. 10 capsule Raylene Everts, MD   ipratropium-albuterol (DUONEB) 0.5-2.5 (3) MG/3ML SOLN Take 3 mLs by nebulization every 4 (four) hours as needed. 360 mL Raylene Everts, MD     Controlled Substance Prescriptions [place] Controlled Substance Registry consulted? Not Applicable   Raylene Everts, MD 08/27/18 2148

## 2018-09-20 ENCOUNTER — Other Ambulatory Visit (HOSPITAL_COMMUNITY): Payer: Self-pay | Admitting: Internal Medicine

## 2018-09-20 NOTE — Telephone Encounter (Signed)
Please review for refill. Thanks!  

## 2018-10-22 ENCOUNTER — Other Ambulatory Visit: Payer: Self-pay | Admitting: Family Medicine

## 2018-11-04 ENCOUNTER — Telehealth: Payer: Self-pay | Admitting: Physician Assistant

## 2018-11-04 ENCOUNTER — Other Ambulatory Visit: Payer: Self-pay | Admitting: *Deleted

## 2018-11-04 MED ORDER — AMLODIPINE BESYLATE 5 MG PO TABS: 5.0000 mg | ORAL_TABLET | Freq: Every day | ORAL | 1 refills | Status: DC

## 2018-11-04 MED ORDER — FUROSEMIDE 40 MG PO TABS: 20.0000 mg | ORAL_TABLET | Freq: Every day | ORAL | 1 refills | Status: DC

## 2018-11-04 NOTE — Telephone Encounter (Signed)
New Message    *STAT* If patient is at the pharmacy, call can be transferred to refill team.   1. Which medications need to be refilled? (please list name of each medication and dose if known) Amlodipine and Furosemide  2. Which pharmacy/location (including street and city if local pharmacy) is medication to be sent to? [facility] on [address]   3. Do they need a 30 day or 90 day supply? 90 days

## 2018-11-04 NOTE — Telephone Encounter (Signed)
This is a [place] pt, Dr. End's pt 

## 2018-11-04 NOTE — Telephone Encounter (Signed)
Requested Prescriptions   Signed Prescriptions Disp Refills  . amLODipine (NORVASC) 5 MG tablet 90 tablet 1    Sig: Take 1 tablet (5 mg total) by mouth daily.    Authorizing Provider: END, CHRISTOPHER    Ordering User: Ophie Burrowes C  . furosemide (LASIX) 40 MG tablet 45 tablet 1    Sig: Take 0.5 tablets (20 mg total) by mouth daily.    Authorizing Provider: END, CHRISTOPHER    Ordering User: Britt Bottom

## 2018-11-04 NOTE — Telephone Encounter (Signed)
Requested Prescriptions   Signed Prescriptions Disp Refills  . amLODipine (NORVASC) 5 MG tablet 90 tablet 1    Sig: Take 1 tablet (5 mg total) by mouth daily.    Authorizing Provider: END, CHRISTOPHER    Ordering User: Naijah Lacek C  . furosemide (LASIX) 40 MG tablet 45 tablet 1    Sig: Take 0.5 tablets (20 mg total) by mouth daily.    Authorizing Provider: END, CHRISTOPHER    Ordering User: Britt Bottom

## 2018-12-20 ENCOUNTER — Other Ambulatory Visit: Payer: Medicare HMO

## 2018-12-20 ENCOUNTER — Other Ambulatory Visit: Payer: Self-pay

## 2019-01-03 ENCOUNTER — Telehealth: Payer: Self-pay | Admitting: *Deleted

## 2019-01-03 NOTE — Telephone Encounter (Signed)
Confirm consent - "In the setting of the current Covid19 crisis, you are scheduled for a (phone or video) visit with your provider on (date) at (time).  Just as we do with many in-office visits, in order for you to participate in this visit, we must obtain consent.  If you'd like, I can send this to your mychart (if signed up) or email for you to review.  Otherwise, I can obtain your verbal consent now.  All virtual visits are billed to your insurance company just like a normal visit would be.  By agreeing to a virtual visit, we'd like you to understand that the technology does not allow for your provider to perform an examination, and thus may limit your provider's ability to fully assess your condition. If your provider identifies any concerns that need to be evaluated in person, we will make arrangements to do so.  Finally, though the technology is pretty good, we cannot assure that it will always work on either your or our end, and in the setting of a video visit, we may have to convert it to a phone-only visit.  In either situation, we cannot ensure that we have a secure connection.  Are you willing to proceed?" STAFF: Did the patient verbally acknowledge consent to telehealth visit? Document YES/NO here:  YES    TELEPHONE CALL NOTE  Jennifer Foster has been deemed a candidate for a follow-up tele-health visit to limit community exposure during the Covid-19 pandemic. I spoke with the patient via phone to ensure availability of phone/video source, confirm preferred email & phone number, and discuss instructions and expectations.  I reminded Jennifer Foster to be prepared with any vital sign and/or heart rhythm information that could potentially be obtained via home monitoring, at the time of her visit. I reminded Jennifer Foster to expect a phone call prior to her visit.  Claude Manges, [place] 01/03/2019 10:09 AM   FULL LENGTH CONSENT FOR TELE-HEALTH VISIT   I hereby voluntarily request, consent and  authorize [facility] and its employed or contracted physicians, Engineer, materials, nurse practitioners or other licensed health care professionals (the Practitioner), to provide me with telemedicine health care services (the "Services") as deemed necessary by the treating Practitioner. I acknowledge and consent to receive the Services by the Practitioner via telemedicine. I understand that the telemedicine visit will involve communicating with the Practitioner through live audiovisual communication technology and the disclosure of certain medical information by electronic transmission. I acknowledge that I have been given the opportunity to request an in-person assessment or other available alternative prior to the telemedicine visit and am voluntarily participating in the telemedicine visit.  I understand that I have the right to withhold or withdraw my consent to the use of telemedicine in the course of my care at any time, without affecting my right to future care or treatment, and that the Practitioner or I may terminate the telemedicine visit at any time. I understand that I have the right to inspect all information obtained and/or recorded in the course of the telemedicine visit and may receive copies of available information for a reasonable fee.  I understand that some of the potential risks of receiving the Services via telemedicine include:  [name] Delay or interruption in medical evaluation due to technological equipment failure or disruption; . Information transmitted may not be sufficient (e.g. poor resolution of images) to allow for appropriate medical decision making by the Practitioner; and/or  . In rare instances, security  protocols could fail, causing a breach of personal health information.  Furthermore, I acknowledge that it is my responsibility to provide information about my medical history, conditions and care that is complete and accurate to the best of my ability. I acknowledge that  Practitioner's advice, recommendations, and/or decision may be based on factors not within their control, such as incomplete or inaccurate data provided by me or distortions of diagnostic images or specimens that may result from electronic transmissions. I understand that the practice of medicine is not an exact science and that Practitioner makes no warranties or guarantees regarding treatment outcomes. I acknowledge that I will receive a copy of this consent concurrently upon execution via email to the email address I last provided but may also request a printed copy by calling the office of [place].    I understand that my insurance will be billed for this visit.   I have read or had this consent read to me. . I understand the contents of this consent, which adequately explains the benefits and risks of the Services being provided via telemedicine.  . I have been provided ample opportunity to ask questions regarding this consent and the Services and have had my questions answered to my satisfaction. . I give my informed consent for the services to be provided through the use of telemedicine in my medical care  By participating in this telemedicine visit I agree to the above.

## 2019-01-06 ENCOUNTER — Other Ambulatory Visit: Payer: Self-pay | Admitting: Physician Assistant

## 2019-01-06 NOTE — Telephone Encounter (Signed)
°*  STAT* If patient is at the pharmacy, call can be transferred to refill team.   1. Which medications need to be refilled? (please list name of each medication and dose if known)  metoprolol succinate (TOPROL-XL) 25 MG 24 hr tablet potassium chloride SA (KLOR-CON M20) 20 MEQ tablet  2. Which pharmacy/location (including street and city if local pharmacy) is medication to be sent to? [facility] [phone]   3. Do they need a 30 day or 90 day supply? 30  For 5 days pt  has been out of medication.    Has a Virtual Visit with Richardson Dopp Monday 05/11

## 2019-01-07 MED ORDER — METOPROLOL SUCCINATE ER 25 MG PO TB24: 25.0000 mg | ORAL_TABLET | Freq: Every day | ORAL | 0 refills | Status: DC

## 2019-01-07 MED ORDER — POTASSIUM CHLORIDE CRYS ER 20 MEQ PO TBCR: 10.0000 meq | EXTENDED_RELEASE_TABLET | Freq: Every day | ORAL | 0 refills | Status: DC

## 2019-01-07 NOTE — Telephone Encounter (Signed)
Pt's medications was sent to pt's pharmacy as requested. Confirmation received.  

## 2019-01-09 NOTE — Progress Notes (Signed)
Virtual Visit via Telephone Note   This visit type was conducted due to national recommendations for restrictions regarding the COVID-19 Pandemic (e.g. social distancing) in an effort to limit this patient's exposure and mitigate transmission in our community.  Due to her co-morbid illnesses, this patient is at least at moderate risk for complications without adequate follow up.  This format is felt to be most appropriate for this patient at this time.  The patient did not have access to video technology/had technical difficulties with video requiring transitioning to audio format only (telephone).  All issues noted in this document were discussed and addressed.  No physical exam could be performed with this format.  Please refer to the patient's chart for her  consent to telehealth for [facility].   Date:  01/10/2019   ID:  Jennifer Foster, DOB 01/20/1946, MRN [id number]  Patient Location: Home Provider Location: Home  PCP:  [name], MD  Cardiologist:  Mertie Moores, MD   Electrophysiologist:  None   Evaluation Performed:  Follow-Up Visit  Chief Complaint: Follow-up on heart failure, coronary disease, atrial fibrillation  History of Present Illness:    Jennifer Foster is a 73 y.o. female with paroxysmal atrial fibrillation, hypertension, chronic anemia due to chronic disease, remote GI bleed, CKD stage 3, and prior substance abusewithcocaine, anemia. Stress testing in March 2019 demonstrated partially reversible anterior defect concerning for ischemia.Cardiac Catheterizationdemonstrated single-vessel CAD with mid RCA 60% and focal 60-70% distal RCA. There was mild to moderate disease involving a large tortuous LAD and LCx.Medical therapy was recommended.  Today, she notes she is doing well.  She has shortness of breath with some activities.  This is overall stable without change.  She has not had chest discomfort, orthopnea, paroxysmal trouble dyspnea or lower  extremity swelling.  She has not had syncope.  The patient does not have symptoms concerning for COVID-19 infection (fever, chills, cough, or new shortness of breath).    Past Medical History:  Diagnosis Date   Acute blood loss anemia 06/26/2015   Aortic atherosclerosis ([place]) 01/01/2018   CT 04/2017   Asthma    Atrial fibrillation ([place]) 10/13/2017   Newly diagnosed in Jan 2019 // Apixaban for anticoag // Apixaban held in 2019 for anemia but resumed; Hgb stable since restarting   CAD (coronary artery disease) 11/27/2017   Nuc stress 3/19: anterior ischemia, ?inf scar, EF 50, intermediate risk // LHC 4/19: LAD mild dz, pLCx 35, OM2 30, mRCA 60, dRCA 65, LVEDP 15-20   Chronic diastolic CHF (congestive heart failure) ([place]) 03/23/2018   Echo 2/19: Moderate LVH, EF 03-00, grade 2 diastolic dysfunction, trivial MR, moderate to severe LAE, PASP 30   Chronic kidney disease (CKD), stage III (moderate) ([place]) 06/27/2015   DIVERTICULAR BLEEDING, HX OF 10/14/2007   Colonoscopy 2008 showed diverticulosis.    Dizziness 11/25/2016   GERD (gastroesophageal reflux disease)    Gout    History of anemia    History of asthma    Hyperlipemia    Hypertension    Joint pain    Lipoma    Thoracic aortic aneurysm without rupture ([place]) 04/03/2017   CT 8/18: ascending thoracic aorta 4.1 cm // unable to do CTA due to CKD >> MRI 11/19 pending   Past Surgical History:  Procedure Laterality Date   ABDOMINAL HYSTERECTOMY  1981   partial, per pt history   COLONOSCOPY N/A 12/08/2013   Procedure: COLONOSCOPY;  Surgeon: [name], MD;  Location: [facility]  ENDOSCOPY;  Service: Endoscopy;  Laterality: N/A;   COLONOSCOPY N/A 06/28/2015   Procedure: COLONOSCOPY;  Surgeon: Clarene Essex, MD;  Location: [facility] ENDOSCOPY;  Service: Endoscopy;  Laterality: N/A;   ESOPHAGOGASTRODUODENOSCOPY N/A 12/08/2013   Procedure: ESOPHAGOGASTRODUODENOSCOPY (EGD);  Surgeon: [name], MD;  Location: [facility] ENDOSCOPY;  Service:  Endoscopy;  Laterality: N/A;   GIVENS CAPSULE STUDY N/A 12/08/2013   Procedure: GIVENS CAPSULE STUDY;  Surgeon: [name], MD;  Location: [place];  Service: Endoscopy;  Laterality: N/A;   LEFT HEART CATH AND CORONARY ANGIOGRAPHY N/A 12/03/2017   Procedure: LEFT HEART CATH AND CORONARY ANGIOGRAPHY;  Surgeon: Nelva Bush, MD;  Location: [facility] CV LAB;  Service: Cardiovascular;  Laterality: N/A;   LIPOMA EXCISION  01/28/11   neck     Current Meds  Medication Sig   albuterol (PROVENTIL HFA;VENTOLIN HFA) 108 (90 Base) MCG/ACT inhaler Inhale 2 puffs into the lungs every 6 (six) hours as needed for wheezing or shortness of breath.   amLODipine (NORVASC) 5 MG tablet Take 5 mg by mouth 2 (two) times a day.   apixaban (ELIQUIS) 5 MG TABS tablet Take 1 tablet (5 mg total) by mouth 2 (two) times daily.   Baclofen 5 MG TABS TAKE 1 TABLET BY MOUTH TWICE A DAY IF NEEDED FOR MUSCLE SPASM   calcitRIOL (ROCALTROL) 0.25 MCG capsule TK 1 C PO D   furosemide (LASIX) 40 MG tablet Take 0.5 tablets (20 mg total) by mouth daily.   ipratropium-albuterol (DUONEB) 0.5-2.5 (3) MG/3ML SOLN Take 3 mLs by nebulization every 4 (four) hours as needed.   irbesartan (AVAPRO) 150 MG tablet TAKE 1 TABLET(150 MG) BY MOUTH DAILY   metoprolol succinate (TOPROL-XL) 25 MG 24 hr tablet Take 1 tablet (25 mg total) by mouth daily. Please keep upcoming appt in May for future refills. Thank you   nitroGLYCERIN (NITROSTAT) 0.4 MG SL tablet Place 1 tablet (0.4 mg total) under the tongue every 5 (five) minutes as needed for chest pain. If do not resolve after 1 tablet, go to ED   potassium chloride SA (KLOR-CON M20) 20 MEQ tablet Take 0.5 tablets (10 mEq total) by mouth daily. Please keep upcoming appt in May for future refills. Thank you   rosuvastatin (CRESTOR) 20 MG tablet TAKE 1 TABLET BY MOUTH AT BEDTIME   [DISCONTINUED] amLODipine (NORVASC) 5 MG tablet Take 1 tablet (5 mg total) by mouth daily. (Patient  taking differently: Take 5 mg by mouth 2 (two) times a day. )   [DISCONTINUED] oseltamivir (TAMIFLU) 75 MG capsule Take 1 capsule (75 mg total) by mouth every 12 (twelve) hours.     Allergies:   Ace inhibitors; Lisinopril; and Tramadol   Social History   Tobacco Use   Smoking status: Former Smoker    Packs/day: 0.25    Types: Cigarettes   Smokeless tobacco: Never Used  Substance Use Topics   Alcohol use: Yes    Alcohol/week: 0.0 standard drinks    Comment: occasionally   Drug use: No     Family Hx: The patient's family history includes Diabetes type II in her unknown relative; Kidney failure in her son and unknown relative.  ROS:   Please see the history of present illness.    All other systems reviewed and are negative.   Prior CV studies:   The following studies were reviewed today:  Cardiac catheterization 12/03/2017 LAD mild disease LCx proximal 35; OM230 RCA mid 60, distal 65 LVEDP 15-20  Nuclear stress test 11/12/2017  Medium size, moderate severity partially reversible anterior perfusion defect suggestive of ischemia. There is also a smaller, basal to mid inferior perfusion defect which is fixed, suggestive of artifact or scar. LVEF 50% with inferior hypokinesis. This is an intermediate risk study. Clinical correlation is recommended.  Echo 10/22/2017 Moderate LVH, EF 59-56, grade 2 diastolic dysfunction, trivial MR, moderate to severe LAE, PASP 30  Chest CT w/ contrast 04/03/17 IMPRESSION: 1. No suspicious pulmonary nodule or mass. 2. Atherosclerotic and mildly aneurysmal ascending thoracic aorta with a maximal diameter of 4.1 cm.Recommend annual imaging followup by CTA or MRA. This recommendation follows 2010 ACCF/AHA/AATS/ACR/ASA/SCA/SCAI/SIR/STS/SVM Guidelines for the Diagnosis and Management of Patients with Thoracic Aortic Disease. Circulation. 2010; 121: L875-I433 3. Coronary artery calcifications 4. Mild centrilobular pulmonary emphysema. 5.  Multinodular thyroid goiter. 6. Left adrenal thickening likely representing hyperplasia and 1.9 cm right adrenal adenoma. Aortic Atherosclerosis(ICD10-170.0); Aortic Atherosclerosis (ICD10-I70.0) and Emphysema (ICD10-J43.9).  Holter 7/18 NSR with PAC's Brief short SVT Overall, unremarkable  Nuclear stress test 11/23/2014 Normal stress nuclear study.overall low risk with no evidence of significant ischemia identified. Left ventricular hypertrophy suggested by increased wall thickness seen on perfusion images. LV Ejection Fraction: 56%.  Echo 03/16/2013 Severe LVH, EF 55-60, mild MR, mild to moderate LAE, PASP 33   Labs/Other Tests and Data Reviewed:    EKG:  No ECG reviewed.  Recent Labs: 02/24/2018: ALT 9; Magnesium 1.7 03/19/2018: NT-Pro BNP 17,931 07/14/2018: BUN 25; Creatinine, Ser 1.82; Hemoglobin 10.8; Platelets 159; Potassium 4.8; Sodium 143   Recent Lipid Panel Lab Results  Component Value Date/Time   CHOL 149 08/07/2017 10:36 AM   TRIG 88 08/07/2017 10:36 AM   HDL 57 08/07/2017 10:36 AM   CHOLHDL 2.6 08/07/2017 10:36 AM   CHOLHDL 2.3 03/18/2016 11:20 AM   LDLCALC 74 08/07/2017 10:36 AM   LDLDIRECT 88 07/16/2011 11:38 AM     Wt Readings from Last 3 Encounters:  01/10/19 170 lb (77.1 kg)  07/14/18 173 lb 12.8 oz (78.8 kg)  04/13/18 164 lb 12.8 oz (74.8 kg)     Objective:    Vital Signs:  BP (!) 155/73    Pulse 73    Ht 5\' 4"  (1.626 m)    Wt 170 lb (77.1 kg)    BMI 29.18 kg/m    VITAL SIGNS:  reviewed GEN:  no acute distress RESPIRATORY:  No labored breathing noted NEURO:  Alert and oriented PSYCH:  She seems to be in good spirits  ASSESSMENT & PLAN:    Chronic diastolic CHF (congestive heart failure) ([place]) Echo in February 2019 with normal LV function and moderate diastolic dysfunction.  Overall, volume status seems to be stable.  Continue current therapy.  Thoracic aortic aneurysm without rupture (HCC) 4.1 cm ascending thoracic aortic aneurysm  by CT in 2018.  She was supposed to get an MRI last year but this was never done.  She needs an MRI without contrast to survey her thoracic aorta.  Due to current restrictions with COVID-19, we will hold off on ordering this right now.  I will see her back in 3 months and we can consider ordering the test at that point.  Paroxysmal atrial fibrillation (HCC) Overall, she seems to be stable.  She seems to be tolerating anticoagulation well.  She is <69 years old and her weight is >60 kg.  Therefore, she should remain on Apixaban 5 mg twice daily.  Coronary artery disease involving native coronary artery of native heart without angina pectoris Nonobstructive coronary artery  disease by cardiac catheterization in 2019.  She is not on aspirin as she is on Apixaban.  Continue statin.  Chronic kidney disease, stage 3 ([place]) Managed by nephrology.  Recent creatinine 2.13.  Hypertension Blood pressure is uncontrolled.  We discussed the importance of good blood pressure control.  Continue current dose of amlodipine, metoprolol, irbesartan.  Start isosorbide 30 mg daily.  Educated About Covid-19 Virus Infection The signs and symptoms of COVID-19 were discussed with the patient and how to seek care for testing (follow up with PCP or arrange E-visit).  The importance of social distancing was discussed today.  Time:   Today, I have spent 16 minutes with the patient with telehealth technology discussing the above problems.     Medication Adjustments/Labs and Tests Ordered: Current medicines are reviewed at length with the patient today.  Concerns regarding medicines are outlined above.   Tests Ordered: No orders of the defined types were placed in this encounter.   Medication Changes: No orders of the defined types were placed in this encounter.   Disposition:  Follow up in 3 month(s)  Signed, Richardson Dopp, PA-C  01/10/2019 10:27 AM    [facility]

## 2019-01-10 ENCOUNTER — Other Ambulatory Visit: Payer: Self-pay

## 2019-01-10 ENCOUNTER — Encounter: Payer: Self-pay | Admitting: Physician Assistant

## 2019-01-10 ENCOUNTER — Telehealth (INDEPENDENT_AMBULATORY_CARE_PROVIDER_SITE_OTHER): Payer: Medicare HMO | Admitting: Physician Assistant

## 2019-01-10 VITALS — BP 155/73 | HR 73 | Ht 64.0 in | Wt 170.0 lb

## 2019-01-10 DIAGNOSIS — I10 Essential (primary) hypertension: Secondary | ICD-10-CM

## 2019-01-10 DIAGNOSIS — I2510 Atherosclerotic heart disease of native coronary artery without angina pectoris: Secondary | ICD-10-CM

## 2019-01-10 DIAGNOSIS — I712 Thoracic aortic aneurysm, without rupture, unspecified: Secondary | ICD-10-CM

## 2019-01-10 DIAGNOSIS — I5032 Chronic diastolic (congestive) heart failure: Secondary | ICD-10-CM

## 2019-01-10 DIAGNOSIS — Z7189 Other specified counseling: Secondary | ICD-10-CM

## 2019-01-10 DIAGNOSIS — I480 Paroxysmal atrial fibrillation: Secondary | ICD-10-CM

## 2019-01-10 DIAGNOSIS — N183 Chronic kidney disease, stage 3 unspecified: Secondary | ICD-10-CM

## 2019-01-10 MED ORDER — ISOSORBIDE MONONITRATE ER 30 MG PO TB24: 30.0000 mg | ORAL_TABLET | Freq: Every day | ORAL | 11 refills | Status: DC

## 2019-01-10 NOTE — Patient Instructions (Signed)
Medication Instructions:  Start isosorbide 30 mg daily for blood pressure.  A prescription has been sent to your pharmacy.  If you need a refill on your cardiac medications before your next appointment, please call your pharmacy.   Lab work: None  If you have labs (blood work) drawn today and your tests are completely normal, you will receive your results only by: [name] MyChart Message (if you have MyChart) OR . A paper copy in the mail If you have any lab test that is abnormal or we need to change your treatment, we will call you to review the results.  Testing/Procedures: None  Follow-Up: At [facility], you and your health needs are our priority.  As part of our continuing mission to provide you with exceptional heart care, we have created designated Provider Care Teams.  These Care Teams include your primary Cardiologist (physician) and Advanced Practice Providers (APPs -  Physician Assistants and Nurse Practitioners) who all work together to provide you with the care you need, when you need it. You will need a follow up appointment in:  3 months.  Please call our office 2 months in advance to schedule this appointment.  You may see Mertie Moores, MD or Richardson Dopp, PA-C   Any Other Special Instructions Will Be Listed Below (If Applicable).  Check your blood pressure 2-3 times a week and send me all of those readings in 2 to 3 weeks.

## 2019-01-11 ENCOUNTER — Encounter: Payer: Self-pay | Admitting: Family Medicine

## 2019-01-12 ENCOUNTER — Encounter: Payer: Self-pay | Admitting: Family Medicine

## 2019-01-12 ENCOUNTER — Other Ambulatory Visit: Payer: Self-pay

## 2019-01-12 ENCOUNTER — Ambulatory Visit (INDEPENDENT_AMBULATORY_CARE_PROVIDER_SITE_OTHER): Payer: Medicare HMO | Admitting: Family Medicine

## 2019-01-12 VITALS — BP 132/72 | HR 63 | Wt 176.6 lb

## 2019-01-12 DIAGNOSIS — I10 Essential (primary) hypertension: Secondary | ICD-10-CM

## 2019-01-12 DIAGNOSIS — M25473 Effusion, unspecified ankle: Secondary | ICD-10-CM

## 2019-01-12 DIAGNOSIS — N183 Chronic kidney disease, stage 3 unspecified: Secondary | ICD-10-CM

## 2019-01-12 DIAGNOSIS — I5032 Chronic diastolic (congestive) heart failure: Secondary | ICD-10-CM | POA: Diagnosis not present

## 2019-01-12 DIAGNOSIS — Z1239 Encounter for other screening for malignant neoplasm of breast: Secondary | ICD-10-CM | POA: Diagnosis not present

## 2019-01-12 DIAGNOSIS — E785 Hyperlipidemia, unspecified: Secondary | ICD-10-CM

## 2019-01-12 DIAGNOSIS — F411 Generalized anxiety disorder: Secondary | ICD-10-CM

## 2019-01-12 DIAGNOSIS — I480 Paroxysmal atrial fibrillation: Secondary | ICD-10-CM

## 2019-01-12 DIAGNOSIS — I700 Atherosclerosis of aorta: Secondary | ICD-10-CM

## 2019-01-12 MED ORDER — ESCITALOPRAM OXALATE 10 MG PO TABS: 10.0000 mg | ORAL_TABLET | Freq: Every day | ORAL | 1 refills | Status: DC

## 2019-01-12 MED ORDER — BACLOFEN 5 MG PO TABS: 1.0000 | ORAL_TABLET | Freq: Two times a day (BID) | ORAL | 0 refills | Status: DC | PRN

## 2019-01-12 NOTE — Assessment & Plan Note (Signed)
Waxes and wane. Not suicidal or homicidal. She had been on meds in the past. I will restart SSRI. Referral to [facility] for counseling. F/U as needed.

## 2019-01-12 NOTE — Assessment & Plan Note (Signed)
No swelling today. Likely related to her CHF. COntiue Lasix. Wear compression stocking and keep leg elevated often recommended. Monitor closely.

## 2019-01-12 NOTE — Assessment & Plan Note (Signed)
Stable on blood thinner. Rhythm sounds normal today. Monitor closely.

## 2019-01-12 NOTE — Assessment & Plan Note (Signed)
On Statin

## 2019-01-12 NOTE — Assessment & Plan Note (Signed)
Stable with close nephrology f/u.

## 2019-01-12 NOTE — Assessment & Plan Note (Signed)
Euvolemic today. Continue diuretics and Betablocker. Close f/u with Cards.

## 2019-01-12 NOTE — Patient Instructions (Signed)

## 2019-01-12 NOTE — Progress Notes (Signed)
Subjective:     Patient ID: Jennifer Foster, female   DOB: 03-26-1946, 73 y.o.   MRN: [id number]  HPI Ankle swelling: C/O B/L ankle swelling for 1 week. Swelling is worse at night. Denies any pain, no redness. CHF/HTN:Here for f/u. She is compliant with her meds. Denies any chest pain, no SOB. Anxiety: Her husband stated that she gets to edgy with every little thing. She stated that her anxiety has worsen because of two death in her family in the last few months and she worry about her grandson who is in [place]. She does not want him to contact COVID. He will be home in Oct. She looks forward to that.  Current Outpatient Medications on File Prior to Visit  Medication Sig Dispense Refill  . amLODipine (NORVASC) 5 MG tablet Take 5 mg by mouth 2 (two) times a day.    [name] apixaban (ELIQUIS) 5 MG TABS tablet Take 1 tablet (5 mg total) by mouth 2 (two) times daily. 180 tablet 3  . calcitRIOL (ROCALTROL) 0.25 MCG capsule TK 1 C PO D    . furosemide (LASIX) 40 MG tablet Take 0.5 tablets (20 mg total) by mouth daily. 45 tablet 1  . irbesartan (AVAPRO) 150 MG tablet TAKE 1 TABLET(150 MG) BY MOUTH DAILY 30 tablet 10  . isosorbide mononitrate (IMDUR) 30 MG 24 hr tablet Take 1 tablet (30 mg total) by mouth daily. 30 tablet 11  . metoprolol succinate (TOPROL-XL) 25 MG 24 hr tablet Take 1 tablet (25 mg total) by mouth daily. Please keep upcoming appt in May for future refills. Thank you 90 tablet 0  . potassium chloride SA (KLOR-CON M20) 20 MEQ tablet Take 0.5 tablets (10 mEq total) by mouth daily. Please keep upcoming appt in May for future refills. Thank you 90 tablet 0  . rosuvastatin (CRESTOR) 20 MG tablet TAKE 1 TABLET BY MOUTH AT BEDTIME 90 tablet 2  . albuterol (PROVENTIL HFA;VENTOLIN HFA) 108 (90 Base) MCG/ACT inhaler Inhale 2 puffs into the lungs every 6 (six) hours as needed for wheezing or shortness of breath. (Patient not taking: Reported on 01/12/2019) 1 Inhaler 2  . Baclofen 5 MG TABS TAKE 1 TABLET BY  MOUTH TWICE A DAY IF NEEDED FOR MUSCLE SPASM (Patient not taking: Reported on 01/12/2019) 30 tablet 0  . ipratropium-albuterol (DUONEB) 0.5-2.5 (3) MG/3ML SOLN Take 3 mLs by nebulization every 4 (four) hours as needed. (Patient not taking: Reported on 01/12/2019) 360 mL 0  . nitroGLYCERIN (NITROSTAT) 0.4 MG SL tablet Place 1 tablet (0.4 mg total) under the tongue every 5 (five) minutes as needed for chest pain. If do not resolve after 1 tablet, go to ED (Patient not taking: Reported on 01/12/2019) 10 tablet 0   No current facility-administered medications on file prior to visit.    Past Medical History:  Diagnosis Date  . Acute blood loss anemia 06/26/2015  . Acute respiratory failure with hypoxia ([place]) 02/21/2018  . Aortic atherosclerosis ([place]) 01/01/2018   CT 04/2017  . Asthma   . Atrial fibrillation ([place]) 10/13/2017   Newly diagnosed in Jan 2019 // Apixaban for anticoag // Apixaban held in 2019 for anemia but resumed; Hgb stable since restarting  . CAD (coronary artery disease) 11/27/2017   Nuc stress 3/19: anterior ischemia, ?inf scar, EF 50, intermediate risk // LHC 4/19: LAD mild dz, pLCx 35, OM2 30, mRCA 60, dRCA 65, LVEDP 15-20  . CAP (community acquired pneumonia) 02/21/2018  . Chronic diastolic CHF (congestive heart failure) (  [place]) 03/23/2018   Echo 2/19: Moderate LVH, EF 61-60, grade 2 diastolic dysfunction, trivial MR, moderate to severe LAE, PASP 30  . Chronic kidney disease (CKD), stage III (moderate) ([place]) 06/27/2015  . DIVERTICULAR BLEEDING, HX OF 10/14/2007   Colonoscopy 2008 showed diverticulosis.   . Dizziness 11/25/2016  . GERD (gastroesophageal reflux disease)   . Gout   . History of anemia   . History of asthma   . Hyperlipemia   . Hypertension   . Joint pain   . Lipoma   . Thoracic aortic aneurysm without rupture ([place]) 04/03/2017   CT 8/18: ascending thoracic aorta 4.1 cm // unable to do CTA due to CKD >> MRI 11/19 pending    Vitals:   01/12/19 1318 01/12/19 1336  BP:  (!) 142/70 132/72  Pulse: 63   SpO2: 98%   Weight: 176 lb 9.6 oz (80.1 kg)      Review of Systems  Respiratory: Negative.   Cardiovascular: Negative.   Gastrointestinal: Negative.   Genitourinary: Negative.   Musculoskeletal: Negative for arthralgias.       Ankle swelling  Psychiatric/Behavioral: Negative for self-injury and sleep disturbance. The patient is nervous/anxious.   All other systems reviewed and are negative.      GAD 7 : Generalized Anxiety Score 01/12/2019 05/19/2017 01/30/2017 03/18/2016  Nervous, Anxious, on Edge 0 2 1 2   Control/stop worrying 1 3 3 2   Worry too much - different things 2 3 1 2   Trouble relaxing 1 2 1 1   Restless 0 1 0 2  Easily annoyed or irritable 3 0 2 2  Afraid - awful might happen 0 0 0 1  Total GAD 7 Score 7 11 8 12   Anxiety Difficulty Somewhat difficult Very difficult Somewhat difficult Very difficult      Office Visit from 01/12/2019 in [facility]  PHQ-9 Total Score  3      Objective:   Physical Exam Vitals signs and nursing note reviewed.  Constitutional:      Appearance: She is not ill-appearing.  Cardiovascular:     Rate and Rhythm: Normal rate and regular rhythm.     Pulses: Normal pulses.     Heart sounds: Normal heart sounds. No murmur.  Pulmonary:     Effort: Pulmonary effort is normal. No respiratory distress.     Breath sounds: Normal breath sounds. No stridor. No wheezing or rhonchi.  Abdominal:     General: Abdomen is flat. Bowel sounds are normal. There is no distension.     Tenderness: There is no abdominal tenderness.  Musculoskeletal:     Right lower leg: No edema.     Left lower leg: No edema.  Neurological:     Mental Status: She is alert.  Psychiatric:        Mood and Affect: Mood normal.     Comments: Not suicidal or homicidal        Assessment:     HTN CHF Ankle swelling Anxiety    Plan:     Check problem list

## 2019-01-12 NOTE — Assessment & Plan Note (Signed)
BP looks good. Continue current regimen. 

## 2019-01-13 ENCOUNTER — Telehealth: Payer: Self-pay | Admitting: Family Medicine

## 2019-01-13 ENCOUNTER — Other Ambulatory Visit: Payer: Self-pay | Admitting: Family Medicine

## 2019-01-13 DIAGNOSIS — Z1239 Encounter for other screening for malignant neoplasm of breast: Secondary | ICD-10-CM

## 2019-01-13 LAB — LIPID PANEL
Chol/HDL Ratio: 3.4 ratio (ref 0.0–4.4)
Cholesterol, Total: 155 mg/dL (ref 100–199)
HDL: 45 mg/dL (ref >39)
LDL Calculated: 76 mg/dL (ref 0–99)
Triglycerides: 170 mg/dL — ABNORMAL HIGH (ref 0–149)
VLDL Cholesterol Cal: 34 mg/dL (ref 5–40)

## 2019-01-13 NOTE — Telephone Encounter (Signed)
FLP result discussed with the patient. Trigl slightly elevated. Continue current dose of Statin. Diet counseling discussed. Recheck lab in a year.

## 2019-01-18 ENCOUNTER — Telehealth: Payer: Self-pay | Admitting: Licensed Clinical Social Worker

## 2019-01-18 NOTE — Telephone Encounter (Signed)
   [facility] Telephone Outreach Note  Start time: 9:30  End time: 9:45 Total time: 15 minutes  Referring Provider: Dr. [name] for Counseling and support to assist with symptoms of anxiety Type of Visit: Telephonic Patient location: home LCSW  location: home All persons participating in visit: patient and LCSW Confirmed patient's address: Yes  Confirmed patient's DOB: Yes   Medication update for Lexapro  Patient has been able to get all medications filled as prescribed: Yes Patient is currently taking all medications as prescribed: No: has reservations about starting medication Patient reports experiencing side effects: No  Family and Social: lives with husband Strengths: support of church family ASSESSMENT: Patient is currently experiencing symptoms of anxiety with constant worry, crying and feeling overwhelmed.  Patient may benefit from further assessment and brief therapeutic interventions to assist with managing her symptoms however patient is declining counseling at this time. She currently talks to her Doristine Bosworth. Patient appreciative of call. Interventions: Review of patient status, including notes from care team members was performed as part of care coordination.  Client interviewed and appropriate assessments performed. Provided client with information and demonstration of relaxed breathing also utilized engagement as part of my intervention.  Other interventions include: Problem-solving teaching/coping strategies and Psychoeducation on symptoms of anxiety and medication. [name] GOALS: 1. Reduce symptoms of: anxiety 2. Increase knowledge and/or ability of: coping skills and stress reduction  Plan: Patient is not interested in counseling at this time, however she is in agreement for LCSW to give her F/U call.  1. Patient will start taking Lexapro today 2.  Patient will start relaxed breathing 3 times a day 3.  LCSW will F/U by phone in about 1 week.  Jennifer Feil, MD has  been notified of this outreach and Jennifer Foster's plan.   Casimer Lanius, LCSW [facility]   [phone] 10:13 AM

## 2019-01-19 ENCOUNTER — Other Ambulatory Visit: Payer: Self-pay | Admitting: Internal Medicine

## 2019-01-19 NOTE — Telephone Encounter (Signed)
Refill Request.  

## 2019-02-02 NOTE — Telephone Encounter (Signed)
   Phone Outreach Note  02/02/2019 Name: Jennifer Foster MRN: [id number] DOB: July 24, 1946  Referred by: [name], MD Reason for referral : Care Coordination (symptoms of anxiety)  An unsuccessful telephone outreach to patient was attempted today ref. F/U call for symptoms of anxiety .  Left voice message to call LCSW   Follow Up Plan:  If no return call is received. LCSW will call again in 3 to 7 days.  Casimer Lanius, [place]   [phone] 11:32 AM

## 2019-02-04 ENCOUNTER — Telehealth: Payer: Self-pay | Admitting: Licensed Clinical Social Worker

## 2019-02-04 NOTE — Telephone Encounter (Signed)
   Care Coordination  Telephone Outreach Note 02/04/2019 Name: Jennifer Foster MRN: [id number] DOB: Feb 13, 1946 Total Call Time: 5 min. Current Medication: escitalopram (LEXAPRO) 10 MG tablet Patient has been able to get all medications filled as prescribed: Yes Patient is currently taking all medications as prescribed: Yes Patient reports experiencing side effects: No Patient describes feeling this way on medications: "My nerves are doing much better" Additional patient concerns: none reported  ASSESSMENT: Patient is taking Lexapro as prescribed by PCP.  She has also implementing relaxed breathing. Reports no side effects from the medication and no symptoms at this time.  Patient is appreciative of F/U call. Intervention:Client interviewed and appropriate assessments performed.  Other interventions include: Psycho-education. Discussed importance of continuing to take the medication even though she is feeling better and to consult PCP prior to stopping the medication.   Plan: Patient will call office if needed.  No additional LCSW services needed at this time [name], MD has been notified of this outreach and Ms. Jennifer Foster's plan.   Casimer Lanius, [facility]   [phone] 11:50 AM

## 2019-03-07 ENCOUNTER — Ambulatory Visit
Admission: RE | Admit: 2019-03-07 | Discharge: 2019-03-07 | Disposition: A | Discharge status: Home / Self Care | Payer: Medicare HMO | Source: Ambulatory Visit | Attending: Family Medicine | Admitting: Family Medicine

## 2019-03-07 DIAGNOSIS — Z1239 Encounter for other screening for malignant neoplasm of breast: Secondary | ICD-10-CM

## 2019-03-08 ENCOUNTER — Telehealth: Payer: Self-pay | Admitting: Physician Assistant

## 2019-03-08 MED ORDER — AMLODIPINE BESYLATE 5 MG PO TABS: 5.0000 mg | ORAL_TABLET | Freq: Two times a day (BID) | ORAL | 3 refills | Status: DC

## 2019-03-08 NOTE — Telephone Encounter (Signed)
At her last visit with me she was taking Amlodipine 5 mg twice daily.  Please confirm that she is taking it this way.  Ok to refill Amlodipine 5 mg take 1 tab twice daily, #180, 3 refills. Richardson Dopp, PA-C    03/08/2019 12:50 PM

## 2019-03-08 NOTE — Telephone Encounter (Signed)
Pt calling requesting a refill on amlodipine 5 mg tablet taking 1 tablet BID. This is not on pt's medication list, it was changed. Does pt supposed to be taking Amlodipine 5 mg tablet BID? Please correct on pt's medication list please and send to pt's pharmacy. Please address

## 2019-03-08 NOTE — Telephone Encounter (Signed)
Pt's medication was sent to pt's pharmacy as requested. Confirmation received.  °

## 2019-03-24 ENCOUNTER — Encounter: Payer: Self-pay | Admitting: Family Medicine

## 2019-04-11 ENCOUNTER — Other Ambulatory Visit: Payer: Self-pay | Admitting: *Deleted

## 2019-04-11 DIAGNOSIS — I712 Thoracic aortic aneurysm, without rupture, unspecified: Secondary | ICD-10-CM

## 2019-04-12 ENCOUNTER — Other Ambulatory Visit: Payer: Self-pay | Admitting: Physician Assistant

## 2019-05-04 ENCOUNTER — Ambulatory Visit (HOSPITAL_COMMUNITY): Admission: RE | Admit: 2019-05-04 | Payer: Medicare HMO | Source: Ambulatory Visit

## 2019-05-20 ENCOUNTER — Other Ambulatory Visit: Payer: Self-pay | Admitting: Physician Assistant

## 2019-05-20 ENCOUNTER — Other Ambulatory Visit: Payer: Self-pay

## 2019-05-20 ENCOUNTER — Ambulatory Visit (HOSPITAL_COMMUNITY)
Admission: RE | Admit: 2019-05-20 | Discharge: 2019-05-20 | Disposition: A | Discharge status: Home / Self Care | Payer: Medicare HMO | Source: Ambulatory Visit | Attending: Physician Assistant | Admitting: Physician Assistant

## 2019-05-20 DIAGNOSIS — I712 Thoracic aortic aneurysm, without rupture, unspecified: Secondary | ICD-10-CM

## 2019-05-20 NOTE — Telephone Encounter (Signed)
Pt last saw Richardson Dopp, PA on 01/10/19, last labs 07/14/18 Creat 1.82, age 73, weight 80.1kg, based on specified criteria pt is on appropriate dosage of Eliquis 5mg  BID.  Will refill rx.

## 2019-05-23 ENCOUNTER — Encounter: Payer: Self-pay | Admitting: Physician Assistant

## 2019-05-31 ENCOUNTER — Other Ambulatory Visit: Payer: Self-pay

## 2019-05-31 ENCOUNTER — Ambulatory Visit (INDEPENDENT_AMBULATORY_CARE_PROVIDER_SITE_OTHER): Payer: Medicare HMO | Admitting: Family Medicine

## 2019-05-31 ENCOUNTER — Telehealth: Payer: Self-pay | Admitting: Family Medicine

## 2019-05-31 ENCOUNTER — Encounter: Payer: Self-pay | Admitting: Family Medicine

## 2019-05-31 VITALS — BP 122/60 | HR 72 | Ht 64.0 in | Wt 151.4 lb

## 2019-05-31 DIAGNOSIS — N183 Chronic kidney disease, stage 3 unspecified: Secondary | ICD-10-CM

## 2019-05-31 DIAGNOSIS — Z23 Encounter for immunization: Secondary | ICD-10-CM

## 2019-05-31 DIAGNOSIS — I5032 Chronic diastolic (congestive) heart failure: Secondary | ICD-10-CM | POA: Diagnosis not present

## 2019-05-31 DIAGNOSIS — E785 Hyperlipidemia, unspecified: Secondary | ICD-10-CM

## 2019-05-31 DIAGNOSIS — I480 Paroxysmal atrial fibrillation: Secondary | ICD-10-CM | POA: Diagnosis not present

## 2019-05-31 NOTE — Assessment & Plan Note (Addendum)
Stable on Eliquis 5 mg BID. Metoprolol/Toprol XL 25 mg QD. Continue same.

## 2019-05-31 NOTE — Assessment & Plan Note (Signed)
LDL slightly above goal at 74. We will check direct LDL and adjust her meds as needed. Continue Crestor 20 mg qd.

## 2019-05-31 NOTE — Progress Notes (Signed)
Subjective:     Patient ID: Jennifer Foster, female   DOB: [birth year], 73 y.o.   MRN: [id number]  HPI Afib: Compliant with her Eliquis. Denies cardiac symptoms. No concern. CHF:No SOB. Compliant with all meds and Cardiology f/u. CKD:See Nephro regularly. They have her on Calcitriol and Potassium. She was uncertain why she was placed on those meds. HLD/HTN:Here for f/u. Compliant with meds.  Current Outpatient Medications on File Prior to Visit  Medication Sig Dispense Refill  . amLODipine (NORVASC) 5 MG tablet Take 1 tablet (5 mg total) by mouth 2 (two) times daily. 180 tablet 3  . calcitRIOL (ROCALTROL) 0.25 MCG capsule TK 1 C PO D    . Cod Liver Oil 1000 MG CAPS Take by mouth.    Jennifer Foster 5 MG TABS tablet TAKE 1 TABLET(5 MG) BY MOUTH TWICE DAILY 180 tablet 1  . escitalopram (LEXAPRO) 10 MG tablet Take 1 tablet (10 mg total) by mouth daily. 90 tablet 1  . furosemide (LASIX) 40 MG tablet Take 0.5 tablets (20 mg total) by mouth daily. 45 tablet 1  . irbesartan (AVAPRO) 150 MG tablet TAKE 1 TABLET(150 MG) BY MOUTH DAILY 30 tablet 10  . isosorbide mononitrate (IMDUR) 30 MG 24 hr tablet Take 1 tablet (30 mg total) by mouth daily. 30 tablet 11  . metoprolol succinate (TOPROL-XL) 25 MG 24 hr tablet TAKE 1 TABLET BY MOUTH EVERY DAY(KEEP UPCOMING APPT IN MAY FOR FUTURE REFILLS) 90 tablet 2  . potassium chloride SA (KLOR-CON M20) 20 MEQ tablet Take 0.5 tablets (10 mEq total) by mouth daily. Please keep upcoming appt in May for future refills. Thank you 90 tablet 0  . rosuvastatin (CRESTOR) 20 MG tablet TAKE 1 TABLET BY MOUTH AT BEDTIME 90 tablet 2  . albuterol (PROVENTIL HFA;VENTOLIN HFA) 108 (90 Base) MCG/ACT inhaler Inhale 2 puffs into the lungs every 6 (six) hours as needed for wheezing or shortness of breath. (Patient not taking: Reported on 01/12/2019) 1 Inhaler 2  . Baclofen 5 MG TABS Take 1 tablet by mouth every 12 (twelve) hours as needed. (Patient not taking: Reported on 05/31/2019) 30 tablet 0   . ipratropium-albuterol (DUONEB) 0.5-2.5 (3) MG/3ML SOLN Take 3 mLs by nebulization every 4 (four) hours as needed. (Patient not taking: Reported on 01/12/2019) 360 mL 0  . nitroGLYCERIN (NITROSTAT) 0.4 MG SL tablet Place 1 tablet (0.4 mg total) under the tongue every 5 (five) minutes as needed for chest pain. If do not resolve after 1 tablet, go to ED (Patient not taking: Reported on 01/12/2019) 10 tablet 0   No current facility-administered medications on file prior to visit.    Past Medical History:  Diagnosis Date  . Acute blood loss anemia 06/26/2015  . Acute respiratory failure with hypoxia ([place]) 02/21/2018  . Aortic atherosclerosis ([place]) 01/01/2018   CT 04/2017  . Asthma   . Atrial fibrillation (Jennifer Foster) 10/13/2017   Newly diagnosed in Jan 2019 // Apixaban for anticoag // Apixaban held in 2019 for anemia but resumed; Hgb stable since restarting  . CAD (coronary artery disease) 11/27/2017   Nuc stress 3/19: anterior ischemia, ?inf scar, EF 50, intermediate risk // LHC 4/19: LAD mild dz, pLCx 35, OM2 30, mRCA 60, dRCA 65, LVEDP 15-20  . CAP (community acquired pneumonia) 02/21/2018  . Chronic diastolic CHF (congestive heart failure) (Jennifer Foster) 03/23/2018   Echo 2/19: Moderate LVH, EF 73-41, grade 2 diastolic dysfunction, trivial MR, moderate to severe LAE, PASP 30  . Chronic kidney disease (CKD),  stage III (moderate) (Jennifer Foster) 06/27/2015  . DIVERTICULAR BLEEDING, HX OF 10/14/2007   Colonoscopy 2008 showed diverticulosis.   . Dizziness 11/25/2016  . GERD (gastroesophageal reflux disease)   . Gout   . History of anemia   . History of asthma   . Hyperlipemia   . Hypertension   . Joint pain   . Lipoma   . Thoracic aortic aneurysm 04/03/2017   CT 8/18: ascending thoracic aorta 4.1 cm // unable to do CTA due to CKD // Chest MRA 05/2019: Ascending thoracic aorta 40 mm   Vitals:   05/31/19 0902  BP: 122/60  Pulse: 72  SpO2: 99%  Weight: 151 lb 6 oz (68.7 kg)  Height: 5\' 4"  (1.626 m)     Review of  Systems  Respiratory: Negative.   Cardiovascular: Negative.   Gastrointestinal: Negative.   Musculoskeletal: Negative.   Neurological: Negative.   All other systems reviewed and are negative.      Objective:   Physical Exam Vitals signs and nursing note reviewed.  Constitutional:      Appearance: She is not ill-appearing.  Neck:     Musculoskeletal: Normal range of motion.  Cardiovascular:     Rate and Rhythm: Normal rate. Rhythm irregular.     Pulses: Normal pulses.     Heart sounds: Normal heart sounds. No murmur.  Pulmonary:     Effort: Pulmonary effort is normal. No respiratory distress.     Breath sounds: Normal breath sounds. No stridor. No wheezing or rhonchi.  Abdominal:     General: Abdomen is flat. Bowel sounds are normal. There is no distension.     Palpations: Abdomen is soft. There is no mass.  Musculoskeletal: Normal range of motion.  Neurological:     General: No focal deficit present.     Mental Status: She is alert and oriented to person, place, and time.        Assessment:     Afib HTN HLD CHF CKD    Plan:     Check problem list.

## 2019-05-31 NOTE — Assessment & Plan Note (Signed)
F/U Nephro. May continue K+ and Calcitriol per Nephro. I advised she contact them regarding the Potassium since her level are normal, although she is on Lasix which will cause hypoK+, she is also on Avapro which will cause HyperK+. She agreed with the plan. F/U as planned.

## 2019-05-31 NOTE — Telephone Encounter (Signed)
Contacted twice as promise, but no response.  HIPAA compliant message left.  Please advise her when she returns the call.  I reviewed record and it looks like it was her Nephrologist that started her on Calcitriol for her CKD. She may continue per Nephro.  She should still contact them regarding the potassium.

## 2019-05-31 NOTE — Patient Instructions (Signed)
Blood Pressure Record Sheet To take your blood pressure, you will need a blood pressure machine. You can buy a blood pressure machine (blood pressure monitor) at your clinic, drug store, or online. When choosing one, consider:  An automatic monitor that has an arm cuff.  A cuff that wraps snugly around your upper arm. You should be able to fit only one finger between your arm and the cuff.  A device that stores blood pressure reading results.  Do not choose a monitor that measures your blood pressure from your wrist or finger. Follow your health care provider's instructions for how to take your blood pressure. To use this form:  Get one reading in the morning (a.m.) before you take any medicines.  Get one reading in the evening (p.m.) before supper.  Take at least 2 readings with each blood pressure check. This makes sure the results are correct. Wait 1-2 minutes between measurements.  Write down the results in the spaces on this form.  Repeat this once a week, or as told by your health care provider.  Make a follow-up appointment with your health care provider to discuss the results. Blood pressure log Date: _______________________  a.m. _____________________(1st reading) _____________________(2nd reading)  p.m. _____________________(1st reading) _____________________(2nd reading) Date: _______________________  a.m. _____________________(1st reading) _____________________(2nd reading)  p.m. _____________________(1st reading) _____________________(2nd reading) Date: _______________________  a.m. _____________________(1st reading) _____________________(2nd reading)  p.m. _____________________(1st reading) _____________________(2nd reading) Date: _______________________  a.m. _____________________(1st reading) _____________________(2nd reading)  p.m. _____________________(1st reading) _____________________(2nd reading) Date: _______________________  a.m.  _____________________(1st reading) _____________________(2nd reading)  p.m. _____________________(1st reading) _____________________(2nd reading) This information is not intended to replace advice given to you by your health care provider. Make sure you discuss any questions you have with your health care provider. Document Released: 05/17/2003 Document Revised: 10/16/2017 Document Reviewed: 08/18/2017 Elsevier Patient Education  2020 [employer]  

## 2019-05-31 NOTE — Assessment & Plan Note (Signed)
Stable on current regimen: Metoprolol 25 QD, Lasix 20 mg qd,Avapro 150 mg qd and Norvasc 5 mg qd. She did not take her meds this morning and does not check her BP at home. BP looks good this morning. I worry she might be taking too much. Advised to monitor BP closely at home, and return in 1-2 weeks for reassessment and med adjustment. She is to take her meds before coming in. I advised her and wrote on her AVS summary that BP range should be between 100/70 and 140/90. She will call if she falls out of range.

## 2019-06-01 LAB — LDL CHOLESTEROL, DIRECT: LDL Direct: 64 mg/dL (ref 0–99)

## 2019-06-13 DIAGNOSIS — E441 Mild protein-calorie malnutrition: Secondary | ICD-10-CM | POA: Diagnosis not present

## 2019-06-13 DIAGNOSIS — Z7951 Long term (current) use of inhaled steroids: Secondary | ICD-10-CM | POA: Diagnosis not present

## 2019-06-13 DIAGNOSIS — J449 Chronic obstructive pulmonary disease, unspecified: Secondary | ICD-10-CM | POA: Diagnosis not present

## 2019-06-13 DIAGNOSIS — I739 Peripheral vascular disease, unspecified: Secondary | ICD-10-CM | POA: Diagnosis not present

## 2019-06-13 DIAGNOSIS — I480 Paroxysmal atrial fibrillation: Secondary | ICD-10-CM | POA: Diagnosis not present

## 2019-06-13 DIAGNOSIS — G620 Drug-induced polyneuropathy: Secondary | ICD-10-CM | POA: Diagnosis not present

## 2019-06-13 DIAGNOSIS — D6869 Other thrombophilia: Secondary | ICD-10-CM | POA: Diagnosis not present

## 2019-06-13 DIAGNOSIS — N189 Chronic kidney disease, unspecified: Secondary | ICD-10-CM | POA: Diagnosis not present

## 2019-06-13 DIAGNOSIS — I25118 Atherosclerotic heart disease of native coronary artery with other forms of angina pectoris: Secondary | ICD-10-CM | POA: Diagnosis not present

## 2019-06-13 DIAGNOSIS — N2581 Secondary hyperparathyroidism of renal origin: Secondary | ICD-10-CM | POA: Diagnosis not present

## 2019-06-23 ENCOUNTER — Encounter: Payer: Self-pay | Admitting: Family Medicine

## 2019-06-23 DIAGNOSIS — N184 Chronic kidney disease, stage 4 (severe): Secondary | ICD-10-CM | POA: Diagnosis not present

## 2019-06-23 DIAGNOSIS — D631 Anemia in chronic kidney disease: Secondary | ICD-10-CM | POA: Diagnosis not present

## 2019-06-23 DIAGNOSIS — I129 Hypertensive chronic kidney disease with stage 1 through stage 4 chronic kidney disease, or unspecified chronic kidney disease: Secondary | ICD-10-CM | POA: Diagnosis not present

## 2019-06-23 DIAGNOSIS — N2581 Secondary hyperparathyroidism of renal origin: Secondary | ICD-10-CM | POA: Diagnosis not present

## 2019-06-23 DIAGNOSIS — N189 Chronic kidney disease, unspecified: Secondary | ICD-10-CM | POA: Diagnosis not present

## 2019-06-23 DIAGNOSIS — N183 Chronic kidney disease, stage 3 unspecified: Secondary | ICD-10-CM | POA: Diagnosis not present

## 2019-06-24 ENCOUNTER — Ambulatory Visit (INDEPENDENT_AMBULATORY_CARE_PROVIDER_SITE_OTHER): Payer: Medicare HMO | Admitting: Family Medicine

## 2019-06-24 ENCOUNTER — Other Ambulatory Visit: Payer: Self-pay

## 2019-06-24 ENCOUNTER — Encounter: Payer: Self-pay | Admitting: Family Medicine

## 2019-06-24 ENCOUNTER — Ambulatory Visit: Payer: Medicare HMO | Admitting: Family Medicine

## 2019-06-24 DIAGNOSIS — N184 Chronic kidney disease, stage 4 (severe): Secondary | ICD-10-CM | POA: Diagnosis not present

## 2019-06-24 DIAGNOSIS — I10 Essential (primary) hypertension: Secondary | ICD-10-CM | POA: Diagnosis not present

## 2019-06-24 NOTE — Patient Instructions (Signed)
Your BP is low normal. I am glad your kidney doctor already cut back on your Lasix.  We will continue to watch your BP closely. Please check BP at home at least once or twice a day. I will like to see you back in 2-3 months. Your heart rate is also a bit low, but ok. This is because of your heart medication. We will keep an eye on it. Call if you have any question.

## 2019-06-24 NOTE — Assessment & Plan Note (Addendum)
BP stable on current regimen. She came in with all her med bottles and I reviewed meds thoroughly with her. Lasix reduced to 20 mg from 40 mg qd. Monitor closely. Might consider cutting back further if she continues to do well.  Note that her HR is in the mid 27s. She is completely asymptomatic. As discussed with her, this is due to her Metoprolol which she is on for Afib control. F/U soon if having any symptoms. Otherwise, we will monitor closely.

## 2019-06-24 NOTE — Progress Notes (Signed)
Subjective:     Patient ID: Jennifer Foster, female   DOB: [birth year], 73 y.o.   MRN: [id number]  HPI HTN: Compliant with Norvasc 5 mg qd, Lasix reduced by Nephrologist yesterday from 40 mg qd to 20 mg qd,Avapro 150 mg qd,Toprol XL 25 mg qd, and Imdur ER 30 mg qd. She denies any concern and took all meds today. She has not been checking BP regularly at home. Denies cardiac related symptoms.  Current Outpatient Medications on File Prior to Visit  Medication Sig Dispense Refill  . amLODipine (NORVASC) 5 MG tablet Take 1 tablet (5 mg total) by mouth 2 (two) times daily. 180 tablet 3  . calcitRIOL (ROCALTROL) 0.25 MCG capsule TK 1 C PO D    . Cod Liver Oil 1000 MG CAPS Take by mouth.    [name] 5 MG TABS tablet TAKE 1 TABLET(5 MG) BY MOUTH TWICE DAILY 180 tablet 1  . escitalopram (LEXAPRO) 10 MG tablet Take 1 tablet (10 mg total) by mouth daily. 90 tablet 1  . furosemide (LASIX) 40 MG tablet Take 0.5 tablets (20 mg total) by mouth daily. 45 tablet 1  . irbesartan (AVAPRO) 150 MG tablet TAKE 1 TABLET(150 MG) BY MOUTH DAILY 30 tablet 10  . isosorbide mononitrate (IMDUR) 30 MG 24 hr tablet Take 1 tablet (30 mg total) by mouth daily. 30 tablet 11  . metoprolol succinate (TOPROL-XL) 25 MG 24 hr tablet TAKE 1 TABLET BY MOUTH EVERY DAY(KEEP UPCOMING APPT IN MAY FOR FUTURE REFILLS) 90 tablet 2  . Multiple Vitamin (MULTIVITAMIN) capsule Take 1 capsule by mouth daily.    . rosuvastatin (CRESTOR) 20 MG tablet TAKE 1 TABLET BY MOUTH AT BEDTIME 90 tablet 2  . albuterol (PROVENTIL HFA;VENTOLIN HFA) 108 (90 Base) MCG/ACT inhaler Inhale 2 puffs into the lungs every 6 (six) hours as needed for wheezing or shortness of breath. (Patient not taking: Reported on 01/12/2019) 1 Inhaler 2  . Baclofen 5 MG TABS Take 1 tablet by mouth every 12 (twelve) hours as needed. (Patient not taking: Reported on 05/31/2019) 30 tablet 0  . ipratropium-albuterol (DUONEB) 0.5-2.5 (3) MG/3ML SOLN Take 3 mLs by nebulization every 4 (four)  hours as needed. (Patient not taking: Reported on 01/12/2019) 360 mL 0  . nitroGLYCERIN (NITROSTAT) 0.4 MG SL tablet Place 1 tablet (0.4 mg total) under the tongue every 5 (five) minutes as needed for chest pain. If do not resolve after 1 tablet, go to ED (Patient not taking: Reported on 01/12/2019) 10 tablet 0  . potassium chloride SA (KLOR-CON M20) 20 MEQ tablet Take 0.5 tablets (10 mEq total) by mouth daily. Please keep upcoming appt in May for future refills. Thank you 90 tablet 0   No current facility-administered medications on file prior to visit.    Past Medical History:  Diagnosis Date  . Abnormal stress test 12/03/2017  . Acute blood loss anemia 06/26/2015  . Acute respiratory failure with hypoxia ([place]) 02/21/2018  . Aortic atherosclerosis ([place]) 01/01/2018   CT 04/2017  . Asthma   . Atrial fibrillation ([place]) 10/13/2017   Newly diagnosed in Jan 2019 // Apixaban for anticoag // Apixaban held in 2019 for anemia but resumed; Hgb stable since restarting  . CAD (coronary artery disease) 11/27/2017   Nuc stress 3/19: anterior ischemia, ?inf scar, EF 50, intermediate risk // LHC 4/19: LAD mild dz, pLCx 35, OM2 30, mRCA 60, dRCA 65, LVEDP 15-20  . CAP (community acquired pneumonia) 02/21/2018  . Chronic diastolic CHF (congestive  heart failure) ([place]) 03/23/2018   Echo 2/19: Moderate LVH, EF 43-32, grade 2 diastolic dysfunction, trivial MR, moderate to severe LAE, PASP 30  . Chronic kidney disease (CKD), stage III (moderate) 06/27/2015  . DIVERTICULAR BLEEDING, HX OF 10/14/2007   Colonoscopy 2008 showed diverticulosis.   . Dizziness 11/25/2016  . GERD (gastroesophageal reflux disease)   . Gout   . History of anemia   . History of asthma   . Hyperlipemia   . Hypertension   . Joint pain   . Lipoma   . Thoracic aortic aneurysm 04/03/2017   CT 8/18: ascending thoracic aorta 4.1 cm // unable to do CTA due to CKD // Chest MRA 05/2019: Ascending thoracic aorta 40 mm     Review of Systems   Respiratory: Negative.   Cardiovascular: Negative.   Gastrointestinal: Negative.   Neurological: Negative.   All other systems reviewed and are negative.      Objective:   Physical Exam Vitals signs and nursing note reviewed.  Constitutional:      Appearance: Normal appearance.  Cardiovascular:     Rate and Rhythm: Regular rhythm. Bradycardia present.     Pulses: Normal pulses.     Heart sounds: Normal heart sounds. No murmur.  Pulmonary:     Effort: Pulmonary effort is normal. No respiratory distress.     Breath sounds: Normal breath sounds. No stridor. No wheezing or rhonchi.  Abdominal:     General: Abdomen is flat. Bowel sounds are normal. There is no distension.     Palpations: There is no mass.     Tenderness: There is no abdominal tenderness.  Musculoskeletal:        General: No swelling.  Neurological:     Mental Status: She is alert.        Assessment:     HTN    Plan:     Check problem list.

## 2019-07-13 ENCOUNTER — Other Ambulatory Visit: Payer: Self-pay | Admitting: Family Medicine

## 2019-07-13 DIAGNOSIS — I5032 Chronic diastolic (congestive) heart failure: Secondary | ICD-10-CM

## 2019-07-13 DIAGNOSIS — I4891 Unspecified atrial fibrillation: Secondary | ICD-10-CM

## 2019-07-13 DIAGNOSIS — F1410 Cocaine abuse, uncomplicated: Secondary | ICD-10-CM

## 2019-07-13 DIAGNOSIS — I10 Essential (primary) hypertension: Secondary | ICD-10-CM

## 2019-07-13 DIAGNOSIS — N183 Chronic kidney disease, stage 3 unspecified: Secondary | ICD-10-CM

## 2019-07-15 ENCOUNTER — Other Ambulatory Visit: Payer: Self-pay | Admitting: Family Medicine

## 2019-07-15 ENCOUNTER — Other Ambulatory Visit: Payer: Self-pay | Admitting: Physician Assistant

## 2019-07-19 ENCOUNTER — Ambulatory Visit: Payer: Self-pay | Admitting: Licensed Clinical Social Worker

## 2019-07-19 NOTE — Chronic Care Management (AMB) (Signed)
  Social Work Care Management  Outreach Note  07/19/2019 Name: ZLATY ALEXA MRN: [id number] DOB: [birth year]  Referred by: [name], MD Reason for referral : Care Coordination (substance resources)  An unsuccessful telephone outreach was attempted today. The patient was referred to the case management team by for assistance with care management and care coordination.   Follow Up Plan: Telephone follow up appointment with care management team member scheduled for:07/22/2019  Casimer Lanius, [place] / [place]   [phone] 11:56 AM

## 2019-07-20 ENCOUNTER — Telehealth: Payer: Self-pay | Admitting: Family Medicine

## 2019-07-20 NOTE — Telephone Encounter (Signed)
LM for patient that social worker called her yesterday and has an appointment to follow up with her on Friday.  Trigger Frasier,CMA

## 2019-07-20 NOTE — Telephone Encounter (Signed)
Will check with MD to see if she has reached out to patient.  Does look like she received a call from social work yesterday and they will follow up with her on Friday.  [name],CMA

## 2019-07-20 NOTE — Telephone Encounter (Signed)
Patient must have been contacted by [facility] team. I did'nt call her.

## 2019-07-20 NOTE — Telephone Encounter (Signed)
Patient is returning a phone call from Dr. [name].  Please try her back again, thank you.

## 2019-07-22 ENCOUNTER — Encounter: Payer: Self-pay | Admitting: Licensed Clinical Social Worker

## 2019-07-22 ENCOUNTER — Ambulatory Visit: Payer: Medicare HMO | Admitting: Licensed Clinical Social Worker

## 2019-07-22 DIAGNOSIS — Z139 Encounter for screening, unspecified: Secondary | ICD-10-CM

## 2019-07-22 NOTE — Patient Instructions (Addendum)
Licensed Clinical Social Worker Visit Information Jennifer Foster  it was nice speaking with you. Please call me directly if you have questions [phone] Goals we discussed today: You did not identify any goals today.   Materials provided: Jennifer Foster was given information about Care Management services today including:  1. Care Management services include personalized support from designated clinical staff supervised by her physician, including individualized plan of care and coordination with other care providers 2. 24/7 contact [phone] for assistance for urgent and routine care needs. 3. Care Management services at any time by phone call to the office staff.  Patient agreed to services and verbal consent obtained.     The patient verbalized understanding of instructions provided today and declined a print copy of patient instruction materials.  Follow up plan:  Appointment scheduled for SW follow up with client by phone on: 09/07/2019  Maurine Cane, LCSW

## 2019-07-22 NOTE — Chronic Care Management (AMB) (Signed)
Social Work Care Management  Initial Visit Note  07/22/2019 Name: Jennifer Foster MRN: [id number] DOB: 15-Jan-1946  Assessment: Jennifer Foster is a 73 y.o. year old female who sees [name], MD for primary care. The care management team was consulted for assistance with care management and care coordination needs related to Substance Abuse Counseling.  Patient denies need for substance resources reports not using drugs, last use was 4 years ago. Patient reports taking medication as prescribed. SDOH (Social Determinants of Health) screening performed today no needs identified.    Review of patient status, including review of consultants reports, relevant laboratory and other test results, and collaboration with appropriate care team members and the patient's provider was performed as part of comprehensive patient evaluation and provision of care management services.    Outpatient Encounter Medications as of 07/22/2019  Medication Sig  . albuterol (PROVENTIL HFA;VENTOLIN HFA) 108 (90 Base) MCG/ACT inhaler Inhale 2 puffs into the lungs every 6 (six) hours as needed for wheezing or shortness of breath. (Patient not taking: Reported on 01/12/2019)  . amLODipine (NORVASC) 5 MG tablet Take 1 tablet (5 mg total) by mouth 2 (two) times daily.  . Baclofen 5 MG TABS Take 1 tablet by mouth every 12 (twelve) hours as needed. (Patient not taking: Reported on 05/31/2019)  . calcitRIOL (ROCALTROL) 0.25 MCG capsule TK 1 C PO D  . Cod Liver Oil 1000 MG CAPS Take by mouth.  [name] 5 MG TABS tablet TAKE 1 TABLET(5 MG) BY MOUTH TWICE DAILY  . escitalopram (LEXAPRO) 10 MG tablet TAKE 1 TABLET(10 MG) BY MOUTH DAILY  . furosemide (LASIX) 40 MG tablet Take 0.5 tablets (20 mg total) by mouth daily.  [name] ipratropium-albuterol (DUONEB) 0.5-2.5 (3) MG/3ML SOLN Take 3 mLs by nebulization every 4 (four) hours as needed. (Patient not taking: Reported on 01/12/2019)  . irbesartan (AVAPRO) 150 MG tablet TAKE 1 TABLET(150  MG) BY MOUTH DAILY  . isosorbide mononitrate (IMDUR) 30 MG 24 hr tablet Take 1 tablet (30 mg total) by mouth daily.  . metoprolol succinate (TOPROL-XL) 25 MG 24 hr tablet TAKE 1 TABLET BY MOUTH EVERY DAY(KEEP UPCOMING APPT IN MAY FOR FUTURE REFILLS)  . Multiple Vitamin (MULTIVITAMIN) capsule Take 1 capsule by mouth daily.  . nitroGLYCERIN (NITROSTAT) 0.4 MG SL tablet Place 1 tablet (0.4 mg total) under the tongue every 5 (five) minutes as needed for chest pain. If do not resolve after 1 tablet, go to ED (Patient not taking: Reported on 01/12/2019)  . potassium chloride SA (KLOR-CON) 20 MEQ tablet Take 0.5 tablets (10 mEq total) by mouth daily.  . rosuvastatin (CRESTOR) 20 MG tablet TAKE 1 TABLET BY MOUTH AT BEDTIME   No facility-administered encounter medications on file as of 07/22/2019.     Goals Addressed   None    Follow up plan:  The patient has been provided with contact information for the care management team and has been advised to call with any health related questions or concerns.  No further follow up required: by LCSW at this time.  Patient is open to a F/U check in call in the next 6 to 8 weeks.  F/U call scheduled the first week in Jan. 2021  Jennifer Foster was given information about Care Management services today including:  1. Care Management services include personalized support from designated clinical staff supervised by a physician, including individualized plan of care and coordination with other care providers 2. 24/7 contact phone numbers for assistance for  urgent and routine care needs. 3. The patient may stop Care Management services at any time (effective at the end of the month) by phone call to the office staff.  Patient agreed to services and verbal consent obtained.  Casimer Lanius, LCSW Clinical Social Worker [place] / [place]   [phone] 12:01 PM

## 2019-08-04 ENCOUNTER — Telehealth: Payer: Self-pay

## 2019-08-04 NOTE — Telephone Encounter (Signed)
I have started a Isosorbide non-formulary PA through covermymeds. Key: [id number]  Following message received: Jennifer Foster Key: [id number]   Outcome  Available without authorization.  I have s/w a pharmacist at [facility] who advised me that a PA is not required at this and that the refill was requested too soon which generated a fax notifying us of a needed PA. The pt can get next refill on 08/07/2019.

## 2019-08-15 ENCOUNTER — Other Ambulatory Visit: Payer: Self-pay | Admitting: Physician Assistant

## 2019-08-31 ENCOUNTER — Other Ambulatory Visit: Payer: Self-pay | Admitting: *Deleted

## 2019-08-31 MED ORDER — ESCITALOPRAM OXALATE 10 MG PO TABS: ORAL_TABLET | ORAL | 1 refills | Status: DC

## 2019-09-04 ENCOUNTER — Other Ambulatory Visit: Payer: Self-pay

## 2019-09-04 ENCOUNTER — Emergency Department (HOSPITAL_COMMUNITY): Payer: Medicare HMO

## 2019-09-04 ENCOUNTER — Inpatient Hospital Stay (HOSPITAL_COMMUNITY)
Admission: EM | Admit: 2019-09-04 | Discharge: 2019-09-06 | DRG: 378 | Disposition: A | Discharge status: Home / Self Care | Payer: Medicare HMO | Attending: Internal Medicine | Admitting: Internal Medicine

## 2019-09-04 ENCOUNTER — Encounter (HOSPITAL_COMMUNITY): Payer: Self-pay | Admitting: Emergency Medicine

## 2019-09-04 DIAGNOSIS — E7800 Pure hypercholesterolemia, unspecified: Secondary | ICD-10-CM

## 2019-09-04 DIAGNOSIS — R531 Weakness: Secondary | ICD-10-CM | POA: Diagnosis not present

## 2019-09-04 DIAGNOSIS — Z79899 Other long term (current) drug therapy: Secondary | ICD-10-CM | POA: Diagnosis not present

## 2019-09-04 DIAGNOSIS — K219 Gastro-esophageal reflux disease without esophagitis: Secondary | ICD-10-CM | POA: Diagnosis present

## 2019-09-04 DIAGNOSIS — K5730 Diverticulosis of large intestine without perforation or abscess without bleeding: Secondary | ICD-10-CM | POA: Diagnosis not present

## 2019-09-04 DIAGNOSIS — K259 Gastric ulcer, unspecified as acute or chronic, without hemorrhage or perforation: Secondary | ICD-10-CM | POA: Diagnosis not present

## 2019-09-04 DIAGNOSIS — I130 Hypertensive heart and chronic kidney disease with heart failure and stage 1 through stage 4 chronic kidney disease, or unspecified chronic kidney disease: Secondary | ICD-10-CM | POA: Diagnosis not present

## 2019-09-04 DIAGNOSIS — Z87891 Personal history of nicotine dependence: Secondary | ICD-10-CM | POA: Diagnosis not present

## 2019-09-04 DIAGNOSIS — K922 Gastrointestinal hemorrhage, unspecified: Secondary | ICD-10-CM | POA: Diagnosis not present

## 2019-09-04 DIAGNOSIS — Z888 Allergy status to other drugs, medicaments and biological substances status: Secondary | ICD-10-CM | POA: Diagnosis not present

## 2019-09-04 DIAGNOSIS — N1832 Chronic kidney disease, stage 3b: Secondary | ICD-10-CM | POA: Diagnosis present

## 2019-09-04 DIAGNOSIS — E785 Hyperlipidemia, unspecified: Secondary | ICD-10-CM | POA: Diagnosis present

## 2019-09-04 DIAGNOSIS — I4891 Unspecified atrial fibrillation: Secondary | ICD-10-CM | POA: Diagnosis present

## 2019-09-04 DIAGNOSIS — F329 Major depressive disorder, single episode, unspecified: Secondary | ICD-10-CM | POA: Diagnosis present

## 2019-09-04 DIAGNOSIS — Z20822 Contact with and (suspected) exposure to covid-19: Secondary | ICD-10-CM | POA: Diagnosis present

## 2019-09-04 DIAGNOSIS — Z833 Family history of diabetes mellitus: Secondary | ICD-10-CM | POA: Diagnosis not present

## 2019-09-04 DIAGNOSIS — K254 Chronic or unspecified gastric ulcer with hemorrhage: Secondary | ICD-10-CM | POA: Diagnosis not present

## 2019-09-04 DIAGNOSIS — D62 Acute posthemorrhagic anemia: Secondary | ICD-10-CM | POA: Diagnosis present

## 2019-09-04 DIAGNOSIS — Z885 Allergy status to narcotic agent status: Secondary | ICD-10-CM

## 2019-09-04 DIAGNOSIS — K3189 Other diseases of stomach and duodenum: Secondary | ICD-10-CM | POA: Diagnosis not present

## 2019-09-04 DIAGNOSIS — Z90711 Acquired absence of uterus with remaining cervical stump: Secondary | ICD-10-CM | POA: Diagnosis not present

## 2019-09-04 DIAGNOSIS — Z7901 Long term (current) use of anticoagulants: Secondary | ICD-10-CM

## 2019-09-04 DIAGNOSIS — I480 Paroxysmal atrial fibrillation: Secondary | ICD-10-CM | POA: Diagnosis not present

## 2019-09-04 DIAGNOSIS — Z66 Do not resuscitate: Secondary | ICD-10-CM | POA: Diagnosis present

## 2019-09-04 DIAGNOSIS — I700 Atherosclerosis of aorta: Secondary | ICD-10-CM | POA: Diagnosis present

## 2019-09-04 DIAGNOSIS — K449 Diaphragmatic hernia without obstruction or gangrene: Secondary | ICD-10-CM | POA: Diagnosis present

## 2019-09-04 DIAGNOSIS — D631 Anemia in chronic kidney disease: Secondary | ICD-10-CM | POA: Diagnosis present

## 2019-09-04 DIAGNOSIS — I10 Essential (primary) hypertension: Secondary | ICD-10-CM

## 2019-09-04 DIAGNOSIS — N184 Chronic kidney disease, stage 4 (severe): Secondary | ICD-10-CM | POA: Diagnosis present

## 2019-09-04 DIAGNOSIS — I712 Thoracic aortic aneurysm, without rupture: Secondary | ICD-10-CM | POA: Diagnosis present

## 2019-09-04 DIAGNOSIS — I2510 Atherosclerotic heart disease of native coronary artery without angina pectoris: Secondary | ICD-10-CM | POA: Diagnosis present

## 2019-09-04 DIAGNOSIS — F419 Anxiety disorder, unspecified: Secondary | ICD-10-CM | POA: Diagnosis present

## 2019-09-04 DIAGNOSIS — K921 Melena: Secondary | ICD-10-CM | POA: Diagnosis not present

## 2019-09-04 DIAGNOSIS — K2950 Unspecified chronic gastritis without bleeding: Secondary | ICD-10-CM | POA: Diagnosis not present

## 2019-09-04 DIAGNOSIS — I5032 Chronic diastolic (congestive) heart failure: Secondary | ICD-10-CM | POA: Diagnosis not present

## 2019-09-04 DIAGNOSIS — J45909 Unspecified asthma, uncomplicated: Secondary | ICD-10-CM | POA: Diagnosis present

## 2019-09-04 DIAGNOSIS — I4821 Permanent atrial fibrillation: Secondary | ICD-10-CM | POA: Diagnosis present

## 2019-09-04 DIAGNOSIS — R69 Illness, unspecified: Secondary | ICD-10-CM | POA: Diagnosis not present

## 2019-09-04 LAB — CBC
HCT: 27.4 % — ABNORMAL LOW (ref 36.0–46.0)
Hemoglobin: 8.9 g/dL — ABNORMAL LOW (ref 12.0–15.0)
MCH: 30.3 pg (ref 26.0–34.0)
MCHC: 32.5 g/dL (ref 30.0–36.0)
MCV: 93.2 fL (ref 80.0–100.0)
Platelets: 182 10*3/uL (ref 150–400)
RBC: 2.94 MIL/uL — ABNORMAL LOW (ref 3.87–5.11)
RDW: 13.2 % (ref 11.5–15.5)
WBC: 5.9 10*3/uL (ref 4.0–10.5)
nRBC: 0 % (ref 0.0–0.2)

## 2019-09-04 LAB — COMPREHENSIVE METABOLIC PANEL
ALT: 17 U/L (ref 0–44)
AST: 28 U/L (ref 15–41)
Albumin: 2.5 g/dL — ABNORMAL LOW (ref 3.5–5.0)
Alkaline Phosphatase: 52 U/L (ref 38–126)
Anion gap: 9 (ref 5–15)
BUN: 69 mg/dL — ABNORMAL HIGH (ref 8–23)
CO2: 22 mmol/L (ref 22–32)
Calcium: 8.8 mg/dL — ABNORMAL LOW (ref 8.9–10.3)
Chloride: 112 mmol/L — ABNORMAL HIGH (ref 98–111)
Creatinine, Ser: 2.01 mg/dL — ABNORMAL HIGH (ref 0.44–1.00)
GFR calc Af Amer: 28 mL/min — ABNORMAL LOW (ref >60)
GFR calc non Af Amer: 24 mL/min — ABNORMAL LOW (ref >60)
Glucose, Bld: 100 mg/dL — ABNORMAL HIGH (ref 70–99)
Potassium: 3.5 mmol/L (ref 3.5–5.1)
Sodium: 143 mmol/L (ref 135–145)
Total Bilirubin: 0.9 mg/dL (ref 0.3–1.2)
Total Protein: 5.2 g/dL — ABNORMAL LOW (ref 6.5–8.1)

## 2019-09-04 LAB — POC OCCULT BLOOD, ED: Fecal Occult Bld: POSITIVE — AB

## 2019-09-04 LAB — TYPE AND SCREEN
ABO/RH(D): A POS
Antibody Screen: NEGATIVE

## 2019-09-04 LAB — LIPASE, BLOOD: Lipase: 22 U/L (ref 11–51)

## 2019-09-04 MED ORDER — SODIUM CHLORIDE 0.9 % IV BOLUS
500.0000 mL | Freq: Once | INTRAVENOUS | Status: AC
  Administered 2019-09-04: 500 mL via INTRAVENOUS

## 2019-09-04 MED ORDER — IOHEXOL 9 MG/ML PO SOLN
500.0000 mL | ORAL | Status: AC
  Administered 2019-09-04: 500 mL via ORAL

## 2019-09-04 MED ORDER — IOHEXOL 9 MG/ML PO SOLN: 1000.0000 mL | ORAL | Status: AC

## 2019-09-04 MED ORDER — SODIUM CHLORIDE 0.9 % IV SOLN
80.0000 mg | Freq: Once | INTRAVENOUS | Status: AC
  Administered 2019-09-04: 80 mg via INTRAVENOUS
  Filled 2019-09-04: qty 80

## 2019-09-04 MED ORDER — PANTOPRAZOLE SODIUM 40 MG IV SOLR: 40.0000 mg | Freq: Two times a day (BID) | INTRAVENOUS | Status: DC

## 2019-09-04 MED ORDER — SODIUM CHLORIDE 0.9 % IV SOLN
8.0000 mg/h | INTRAVENOUS | Status: DC
  Administered 2019-09-04 – 2019-09-05 (×2): 8 mg/h via INTRAVENOUS
  Filled 2019-09-04 (×3): qty 80

## 2019-09-04 MED ORDER — IOHEXOL 9 MG/ML PO SOLN
ORAL | Status: AC
  Filled 2019-09-04: qty 1000

## 2019-09-04 NOTE — ED Triage Notes (Signed)
Per pt, states she went to the bathroom yesterday and she said a large amount of black stuff came out-states she slept a lot yesterday and today-states she is tired-no history of a bleed

## 2019-09-04 NOTE — ED Provider Notes (Signed)
Care transferred from [place], [place]. See note for full HPI.  Paitent with Hx GI bleed, will need admission for melanotic stools. Dr. Cristina Gong with [facility] rec admission, protonix, Scope in the AM.  Pending CT AP, Plan for admission after CT results  Heme pos stool  Anticoagulated for afib.   Physical Exam  BP (!) 121/56   Pulse 71   Temp 97.9 F (36.6 C) (Oral)   Resp (!) 21   Ht 5\' 4"  (1.626 m)   Wt 69.4 kg   SpO2 99%   BMI 26.26 kg/m   Physical Exam Vitals and nursing note reviewed.  Constitutional:      General: She is not in acute distress.    Appearance: She is well-developed.  HENT:     Head: Atraumatic.  Eyes:     Pupils: Pupils are equal, round, and reactive to light.  Cardiovascular:     Rate and Rhythm: Normal rate.  Pulmonary:     Effort: No respiratory distress.  Abdominal:     General: There is no distension.  Musculoskeletal:        General: Normal range of motion.     Cervical back: Normal range of motion.  Skin:    General: Skin is warm and dry.  Neurological:     Mental Status: She is alert.     ED Course/Procedures     Procedures CT ABDOMEN PELVIS WO CONTRAST  Result Date: 09/04/2019 CLINICAL DATA:  Abdominal pain and GI bleeding. Question diverticulitis. EXAM: CT ABDOMEN AND PELVIS WITHOUT CONTRAST TECHNIQUE: Multidetector CT imaging of the abdomen and pelvis was performed following the standard protocol without IV contrast. COMPARISON:  None. FINDINGS: Lower chest: Cardiomegaly. Small amount of pericardial fluid. Lung bases are clear. Hepatobiliary: Suspicion of 1 or 2 small gallstones dependent in the gallbladder. No sign of cholecystitis or obstruction. Liver parenchyma is normal except for a few small calcified granulomas. Pancreas: Normal Spleen: Normal Adrenals/Urinary Tract: 1.3 cm adrenal adenoma on the right. No significant renal parenchymal finding. Some renal vascular calcification. No hydronephrosis. Bladder is normal. Stomach/Bowel:  Diverticulosis of the left colon. No sign of active diverticulitis. Muscular hypertrophy in the sigmoid region, typical of diverticulosis. Vascular/Lymphatic: Aortic atherosclerosis. IVC is normal. No retroperitoneal adenopathy. Reproductive: No pelvic mass.  Previous hysterectomy. Other: None Musculoskeletal: Mild lower lumbar degenerative changes considering age. IMPRESSION: Diverticulosis of the descending and sigmoid colon but no CT evidence of diverticulitis. Low level diverticulitis can be inapparent at imaging. Aortic atherosclerosis. Question 1 or 2 small gallstones dependent in the gallbladder. No sign of cholecystitis or obstruction. Cardiomegaly and small amount of pericardial fluid. 1.3 cm right adrenal adenoma. Electronically Signed   By: Nelson Chimes M.D.   On: 09/04/2019 21:21   Labs Reviewed  COMPREHENSIVE METABOLIC PANEL - Abnormal; Notable for the following components:      Result Value   Chloride 112 (*)    Glucose, Bld 100 (*)    BUN 69 (*)    Creatinine, Ser 2.01 (*)    Calcium 8.8 (*)    Total Protein 5.2 (*)    Albumin 2.5 (*)    GFR calc non Af Amer 24 (*)    GFR calc Af Amer 28 (*)    All other components within normal limits  CBC - Abnormal; Notable for the following components:   RBC 2.94 (*)    Hemoglobin 8.9 (*)    HCT 27.4 (*)    All other components within normal limits  POC OCCULT  BLOOD, ED - Abnormal; Notable for the following components:   Fecal Occult Bld POSITIVE (*)    All other components within normal limits  SARS CORONAVIRUS 2 (TAT 6-24 HRS)  LIPASE, BLOOD  URINALYSIS, ROUTINE W REFLEX MICROSCOPIC  TYPE AND SCREEN   MDM  CT with diverticulosis without diverticulitis. Stable VS.  Will be admitted for upper GI bleed, given Protonix, n.p.o., GI will see in the morning.  2150: CONSULT with Dr. Flossie Buffy with [facility] who will evaluate patient for admission.  The patient appears reasonably stabilized for admission considering the current resources, flow, and  capabilities available in the ED at this time, and I doubt any other [facility] requiring further screening and/or treatment in the ED prior to admission.       Nitzia Perren A, PA-C 09/04/19 2156    Quintella Reichert, MD 09/05/19 1045

## 2019-09-04 NOTE — H&P (Signed)
History and Physical    Jennifer Foster BJY:[id number] DOB: April 29, 1946 DOA: 09/04/2019  PCP: Doreene Eland, MD  Patient coming from: Home, lives with her husband  I have personally briefly reviewed patient's old medical records in [facility]  Chief Complaint: Weakness  HPI: Jennifer Foster is a 74 y.o. female with medical history significant of CAD, diastolic CHF, atrial fibrillation on Eliquis, chronic kidney disease stage III, chronic anemia, hypertension and aortic aneurysm who presents with concerns of weakness. About 2 days ago, she noticed black loose stools when having a bowel movement.  The following day when she wiped after urinating she saw blood clots.  She felt some epigastric pain sometime prior to these episodes.  She presents today since she feels progressively more weak.  She felt dizzy earlier this morning but felt there was because she was stressed out.  Denies any chest pain or shortness of breath.  No previous history of GI bleed.  No recent NSAID use.  Her last dose of Eliquis was earlier this morning.  Endorse 3 cigarettes per day, occasional alcohol use.  Former cocaine user but quit 4 years ago.  ED Course: She was afebrile, normotensive on room air.  WBC of 5.9, hemoglobin 8.9 from 10.8 about a year ago.  Glucose of 100, creatinine of 2.01/BUN 69. FOBT positive. Negative UA.   CT abdomen and pelvis showed diverticulosis of the descending and sigmoid colon but no CT evidence of diverticulitis.  ED PA discussed with Dr. Matthias Hughs with GI and Patient recommended protonix gtt and scope in the morning.   Review of Systems:  Constitutional: No Weight Change, No Fever ENT/Mouth: No sore throat, No Rhinorrhea Eyes: No Eye Pain, No Vision Changes Cardiovascular: No Chest Pain, no SOB Respiratory: No Cough, No Sputum Gastrointestinal: No Nausea, No Vomiting, No Diarrhea, No Constipation, No Pain Genitourinary: no Urinary Incontinence Musculoskeletal: No Arthralgias,  No Myalgias Skin: No Skin Lesions, No Pruritus, Neuro:+ Weakness, No Numbness Psych: no decrease appetite Heme/Lymph: No Bruising, No Bleeding  Past Medical History:  Diagnosis Date  . Abnormal stress test 12/03/2017  . Acute blood loss anemia 06/26/2015  . Acute respiratory failure with hypoxia (HCC) 02/21/2018  . Aortic atherosclerosis (HCC) 01/01/2018   CT 04/2017  . Asthma   . Atrial fibrillation (HCC) 10/13/2017   Newly diagnosed in Jan 2019 // Apixaban for anticoag // Apixaban held in 2019 for anemia but resumed; Hgb stable since restarting  . CAD (coronary artery disease) 11/27/2017   Nuc stress 3/19: anterior ischemia, ?inf scar, EF 50, intermediate risk // LHC 4/19: LAD mild dz, pLCx 35, OM2 30, mRCA 60, dRCA 65, LVEDP 15-20  . CAP (community acquired pneumonia) 02/21/2018  . Chronic diastolic CHF (congestive heart failure) (HCC) 03/23/2018   Echo 2/19: Moderate LVH, EF 60-65, grade 2 diastolic dysfunction, trivial MR, moderate to severe LAE, PASP 30  . Chronic kidney disease (CKD), stage III (moderate) 06/27/2015  . DIVERTICULAR BLEEDING, HX OF 10/14/2007   Colonoscopy 2008 showed diverticulosis.   . Dizziness 11/25/2016  . GERD (gastroesophageal reflux disease)   . Gout   . History of anemia   . History of asthma   . Hyperlipemia   . Hypertension   . Joint pain   . Lipoma   . Thoracic aortic aneurysm 04/03/2017   CT 8/18: ascending thoracic aorta 4.1 cm // unable to do CTA due to CKD // Chest MRA 05/2019: Ascending thoracic aorta 40 mm    Past Surgical History:  Procedure Laterality Date  . ABDOMINAL HYSTERECTOMY  1981   partial, per pt history  . COLONOSCOPY N/A 12/08/2013   Procedure: COLONOSCOPY;  Surgeon: Theda Belfast, MD;  Location: [facility] ENDOSCOPY;  Service: Endoscopy;  Laterality: N/A;  . COLONOSCOPY N/A 06/28/2015   Procedure: COLONOSCOPY;  Surgeon: Vida Rigger, MD;  Location: [facility] ENDOSCOPY;  Service: Endoscopy;  Laterality: N/A;  . ESOPHAGOGASTRODUODENOSCOPY N/A  12/08/2013   Procedure: ESOPHAGOGASTRODUODENOSCOPY (EGD);  Surgeon: Theda Belfast, MD;  Location[facility] ENDOSCOPY;  Service: Endoscopy;  Laterality: N/A;  . GIVENS CAPSULE STUDY N/A 12/08/2013   Procedure: GIVENS CAPSULE STUDY;  Surgeon: Theda Belfast, MD;  Location: [facility] ENDOSCOPY;  Service: Endoscopy;  Laterality: N/A;  . LEFT HEART CATH AND CORONARY ANGIOGRAPHY N/A 12/03/2017   Procedure: LEFT HEART CATH AND CORONARY ANGIOGRAPHY;  Surgeon: Yvonne Kendall, MD;  Location: [facility] INVASIVE CV LAB;  Service: Cardiovascular;  Laterality: N/A;  . LIPOMA EXCISION  01/28/11   neck      Allergies  Allergen Reactions  . Ace Inhibitors Swelling and Other (See Comments)    Eyes swell  . Lisinopril Swelling and Other (See Comments)    Swollen tongue   . Tramadol Itching    Family History  Problem Relation Age of Onset  . Diabetes type II Other   . Kidney failure Other   . Kidney failure Son      Prior to Admission medications   Medication Sig Start Date End Date Taking? Authorizing Provider  albuterol (PROVENTIL HFA;VENTOLIN HFA) 108 (90 Base) MCG/ACT inhaler Inhale 2 puffs into the lungs every 6 (six) hours as needed for wheezing or shortness of breath. 02/24/18  Yes Rizwan, Ladell Heads, MD  amLODipine (NORVASC) 5 MG tablet Take 1 tablet (5 mg total) by mouth 2 (two) times daily. 03/08/19  Yes Weaver, Scott T, PA-C  calcitRIOL (ROCALTROL) 0.25 MCG capsule Take 0.25 mcg by mouth daily.  12/24/18  Yes [provider]  Cod Liver Oil 1000 MG CAPS Take 1,000 mg by mouth daily.    Yes [provider]  ELIQUIS 5 MG TABS tablet TAKE 1 TABLET(5 MG) BY MOUTH TWICE DAILY Patient taking differently: Take 5 mg by mouth 2 (two) times daily.  05/20/19  Yes Nahser, Deloris Ping, MD  escitalopram (LEXAPRO) 10 MG tablet TAKE 1 TABLET(10 MG) BY MOUTH DAILY Patient taking differently: Take 10 mg by mouth daily. TAKE 1 TABLET(10 MG) BY MOUTH DAILY 08/31/19  Yes Janit Pagan T, MD  furosemide (LASIX) 40 MG tablet  Take 0.5 tablets (20 mg total) by mouth daily. 11/04/18  Yes End, Cristal Deer, MD  ipratropium-albuterol (DUONEB) 0.5-2.5 (3) MG/3ML SOLN Take 3 mLs by nebulization every 4 (four) hours as needed. Patient taking differently: Take 3 mLs by nebulization every 4 (four) hours as needed (wheezing/shortness of breath).  08/27/18  Yes Eustace Moore, MD  irbesartan (AVAPRO) 150 MG tablet TAKE 1 TABLET BY MOUTH EVERY DAY Patient taking differently: Take 150 mg by mouth daily.  08/16/19  Yes Weaver, Scott T, PA-C  isosorbide mononitrate (IMDUR) 30 MG 24 hr tablet Take 1 tablet (30 mg total) by mouth daily. 01/10/19 01/10/20 Yes Weaver, Scott T, PA-C  metoprolol succinate (TOPROL-XL) 25 MG 24 hr tablet TAKE 1 TABLET BY MOUTH EVERY DAY(KEEP UPCOMING APPT IN MAY FOR FUTURE REFILLS) Patient taking differently: Take 25 mg by mouth daily.  04/12/19  Yes Weaver, Scott T, PA-C  Multiple Vitamin (MULTIVITAMIN WITH MINERALS) TABS tablet Take 1 tablet by mouth daily.   Yes  [provider]  nitroGLYCERIN (NITROSTAT) 0.4 MG SL tablet Place 1 tablet (0.4 mg total) under the tongue every 5 (five) minutes as needed for chest pain. If do not resolve after 1 tablet, go to ED 10/13/17  Yes Palma Holter, MD  potassium chloride SA (KLOR-CON) 20 MEQ tablet Take 0.5 tablets (10 mEq total) by mouth daily. 07/15/19  Yes Weaver, Scott T, PA-C  rosuvastatin (CRESTOR) 20 MG tablet TAKE 1 TABLET BY MOUTH AT BEDTIME Patient taking differently: Take 20 mg by mouth at bedtime.  10/22/18  Yes Doreene Eland, MD  Baclofen 5 MG TABS Take 1 tablet by mouth every 12 (twelve) hours as needed. Patient not taking: Reported on 05/31/2019 01/12/19   Doreene Eland, MD    Physical Exam: Vitals:   09/04/19 2030 09/04/19 2130 09/04/19 2200 09/04/19 2230  BP: (!) 145/81 (!) 121/56 (!) 141/72 (!) 129/53  Pulse: 70 71 74 77  Resp: 17 (!) 21 16 (!) 22  Temp:      TempSrc:      SpO2: 100% 99% 100% 99%  Weight:      Height:         Constitutional: NAD, calm, comfortable thin female laying in bed Vitals:   09/04/19 2030 09/04/19 2130 09/04/19 2200 09/04/19 2230  BP: (!) 145/81 (!) 121/56 (!) 141/72 (!) 129/53  Pulse: 70 71 74 77  Resp: 17 (!) 21 16 (!) 22  Temp:      TempSrc:      SpO2: 100% 99% 100% 99%  Weight:      Height:       Eyes: PERRL, lids and conjunctivae normal ENMT: Mucous membranes are moist.  Neck: normal, supple, Respiratory: clear to auscultation bilaterally, no wheezing, no crackles. Normal respiratory effort. No accessory muscle use.  Cardiovascular: Regular rate and rhythm, no murmurs / rubs / gallops. No extremity edema.  Abdomen: no tenderness, no masses palpated.  Bowel sounds positive.  Musculoskeletal: no clubbing / cyanosis. No joint deformity upper and lower extremities. Good ROM, no contractures. Normal muscle tone.  Skin: no rashes, lesions, ulcers. No induration. No pallor Neurologic: CN 2-12 grossly intact. Sensation intact, Strength 5/5 in all 4.  Psychiatric: Normal judgment and insight. Alert and oriented x 3. Pleasant mood.     Labs on Admission: I have personally reviewed following labs and imaging studies  CBC: Recent Labs  Lab 09/04/19 1619  WBC 5.9  HGB 8.9*  HCT 27.4*  MCV 93.2  PLT 182   Basic Metabolic Panel: Recent Labs  Lab 09/04/19 1619  NA 143  K 3.5  CL 112*  CO2 22  GLUCOSE 100*  BUN 69*  CREATININE 2.01*  CALCIUM 8.8*   GFR: Estimated Creatinine Clearance: 24.6 mL/min (A) (by C-G formula based on SCr of 2.01 mg/dL (H)). Liver Function Tests: Recent Labs  Lab 09/04/19 1619  AST 28  ALT 17  ALKPHOS 52  BILITOT 0.9  PROT 5.2*  ALBUMIN 2.5*   Recent Labs  Lab 09/04/19 1619  LIPASE 22   No results for input(s): AMMONIA in the last 168 hours. Coagulation Profile: No results for input(s): INR, PROTIME in the last 168 hours. Cardiac Enzymes: No results for input(s): CKTOTAL, CKMB, CKMBINDEX, TROPONINI in the last 168  hours. BNP (last 3 results) No results for input(s): PROBNP in the last 8760 hours. HbA1C: No results for input(s): HGBA1C in the last 72 hours. CBG: No results for input(s): GLUCAP in the last 168 hours. Lipid  Profile: No results for input(s): CHOL, HDL, LDLCALC, TRIG, CHOLHDL, LDLDIRECT in the last 72 hours. Thyroid Function Tests: No results for input(s): TSH, T4TOTAL, FREET4, T3FREE, THYROIDAB in the last 72 hours. Anemia Panel: No results for input(s): VITAMINB12, FOLATE, FERRITIN, TIBC, IRON, RETICCTPCT in the last 72 hours. Urine analysis:    Component Value Date/Time   COLORURINE YELLOW 09/04/2019 2329   APPEARANCEUR CLEAR 09/04/2019 2329   LABSPEC 1.010 09/04/2019 2329   PHURINE 5.0 09/04/2019 2329   GLUCOSEU NEGATIVE 09/04/2019 2329   HGBUR NEGATIVE 09/04/2019 2329   BILIRUBINUR NEGATIVE 09/04/2019 2329   BILIRUBINUR SMALL 03/18/2016 1054   KETONESUR NEGATIVE 09/04/2019 2329   PROTEINUR 100 (A) 09/04/2019 2329   UROBILINOGEN 0.2 03/18/2016 1054   UROBILINOGEN 0.2 06/25/2015 0920   NITRITE NEGATIVE 09/04/2019 2329   LEUKOCYTESUR TRACE (A) 09/04/2019 2329    Radiological Exams on Admission: CT ABDOMEN PELVIS WO CONTRAST  Result Date: 09/04/2019 CLINICAL DATA:  Abdominal pain and GI bleeding. Question diverticulitis. EXAM: CT ABDOMEN AND PELVIS WITHOUT CONTRAST TECHNIQUE: Multidetector CT imaging of the abdomen and pelvis was performed following the standard protocol without IV contrast. COMPARISON:  None. FINDINGS: Lower chest: Cardiomegaly. Small amount of pericardial fluid. Lung bases are clear. Hepatobiliary: Suspicion of 1 or 2 small gallstones dependent in the gallbladder. No sign of cholecystitis or obstruction. Liver parenchyma is normal except for a few small calcified granulomas. Pancreas: Normal Spleen: Normal Adrenals/Urinary Tract: 1.3 cm adrenal adenoma on the right. No significant renal parenchymal finding. Some renal vascular calcification. No hydronephrosis.  Bladder is normal. Stomach/Bowel: Diverticulosis of the left colon. No sign of active diverticulitis. Muscular hypertrophy in the sigmoid region, typical of diverticulosis. Vascular/Lymphatic: Aortic atherosclerosis. IVC is normal. No retroperitoneal adenopathy. Reproductive: No pelvic mass.  Previous hysterectomy. Other: None Musculoskeletal: Mild lower lumbar degenerative changes considering age. IMPRESSION: Diverticulosis of the descending and sigmoid colon but no CT evidence of diverticulitis. Low level diverticulitis can be inapparent at imaging. Aortic atherosclerosis. Question 1 or 2 small gallstones dependent in the gallbladder. No sign of cholecystitis or obstruction. Cardiomegaly and small amount of pericardial fluid. 1.3 cm right adrenal adenoma. Electronically Signed   By: Paulina Fusi M.D.   On: 09/04/2019 21:21    EKG: Independently reviewed.   Assessment/Plan  Melena Suspect Upper GI bleed Hemoglobin of 8.9. Last Eliquis dose the morning of 1/3 Keep NPO ED PA discussed with Dr. Matthias Hughs with GI and recommended protonix gtt and scope in the morning.   Acute on chronic anemia Hgb 8.9 from a prior of 10.8 a year ago Will check iron panel, vitamin B12 and folic as well  chronic kidney disease stage 3b Creatinine of 2.01. Stable.   Diastolic CHF Euvolemic on exam Continue Lasix   Atrial fibrillation Hold Eliquis due to active bleed Continue metoprolol   Hypertension Continue amlodipine, irbesartan   Hyperlipidemia Continue statin  Anxiety Continue Lexapro   DVT prophylaxis:.Lovenox Code Status:DNR Family Communication: Plan discussed with patient at bedside  disposition Plan: Home with at least 2 midnight stays  Consults called:  Admission status: inpatient  Estill Llerena T Cerita Rabelo DO [facility]   If 7PM-7AM, please contact night-coverage [email or web address] Password [email or web address]  09/05/2019, 2:05 AM

## 2019-09-04 NOTE — ED Provider Notes (Signed)
  Face-to-face evaluation   History: She is here for evaluation of suspected rectal bleeding.  She noticed black color stool yesterday, somewhat lighter today.  She complains of generalized weakness.  She is on Eliquis, for atrial fibrillation.  Physical exam: Alert elderly female.  Examined by me at 7:40 PM.  At this time the abdomen is nontender to palpation.  She is alert and cooperative.  No respiratory distress.  Vital signs are stable. Medical screening examination/treatment/procedure(s) were conducted as a shared visit with non-physician practitioner(s) and myself.  I personally evaluated the patient during the encounter    Daleen Bo, MD 09/08/19 [phone]

## 2019-09-04 NOTE — ED Provider Notes (Signed)
[facility] DEPT Provider Note   CSN: [id number] Arrival date & time: 09/04/19  1512     History Chief Complaint  Patient presents with  . Rectal Bleeding    Jennifer Foster is a 74 y.o. female with history of atrial fibrillation currently on Eliquis, coronary artery disease, CHF, CKD, hypertension, hyperlipidemia, thoracic aortic aneurysm presents for evaluation of acute onset black stools since yesterday.  Also reports generalized weakness and had one episode of nonbloody nonbilious emesis in the waiting room.  Denies urinary symptoms.  Denies shortness of breath, chest pain, fevers, chills.  Has not tried anything for her symptoms.  She is currently anticoagulated on Eliquis and reports medication compliance.  She endorses fatigue, decreased appetite and decreased oral intake as well.  The history is provided by the patient.       Past Medical History:  Diagnosis Date  . Abnormal stress test 12/03/2017  . Acute blood loss anemia 06/26/2015  . Acute respiratory failure with hypoxia ([place]) 02/21/2018  . Aortic atherosclerosis ([place]) 01/01/2018   CT 04/2017  . Asthma   . Atrial fibrillation ([place]) 10/13/2017   Newly diagnosed in Jan 2019 // Apixaban for anticoag // Apixaban held in 2019 for anemia but resumed; Hgb stable since restarting  . CAD (coronary artery disease) 11/27/2017   Nuc stress 3/19: anterior ischemia, ?inf scar, EF 50, intermediate risk // LHC 4/19: LAD mild dz, pLCx 35, OM2 30, mRCA 60, dRCA 65, LVEDP 15-20  . CAP (community acquired pneumonia) 02/21/2018  . Chronic diastolic CHF (congestive heart failure) ([place]) 03/23/2018   Echo 2/19: Moderate LVH, EF 41-96, grade 2 diastolic dysfunction, trivial MR, moderate to severe LAE, PASP 30  . Chronic kidney disease (CKD), stage III (moderate) 06/27/2015  . DIVERTICULAR BLEEDING, HX OF 10/14/2007   Colonoscopy 2008 showed diverticulosis.   . Dizziness 11/25/2016  . GERD (gastroesophageal reflux disease)    . Gout   . History of anemia   . History of asthma   . Hyperlipemia   . Hypertension   . Joint pain   . Lipoma   . Thoracic aortic aneurysm 04/03/2017   CT 8/18: ascending thoracic aorta 4.1 cm // unable to do CTA due to CKD // Chest MRA 05/2019: Ascending thoracic aorta 40 mm    Patient Active Problem List   Diagnosis Date Noted  . Ankle swelling 01/12/2019  . Chronic diastolic CHF (congestive heart failure) ([place]) 03/23/2018  . Aortic atherosclerosis ([place]) 01/01/2018  . CAD (coronary artery disease) 11/27/2017  . Atrial fibrillation ([place]) 10/13/2017  . Multinodular goiter 04/14/2017  . Adrenal adenoma 04/14/2017  . Thoracic aortic aneurysm without rupture ([place]) 04/03/2017  . Proteinuria 01/11/2016  . Anxiety and depression 12/25/2015  . Chronic kidney disease, stage 3 ([place]) 06/27/2015  . Essential hypertension   . Cocaine abuse ([place])   . Gout 02/28/2014  . Lipoma 03/19/2011  . Hyperlipidemia 01/01/2011  . Chronic anemia 11/23/2009  . Tobacco abuse 09/03/2009  . Generalized anxiety disorder 08/13/2009    Past Surgical History:  Procedure Laterality Date  . ABDOMINAL HYSTERECTOMY  1981   partial, per pt history  . COLONOSCOPY N/A 12/08/2013   Procedure: COLONOSCOPY;  Surgeon: [name], MD;  Location: [facility];  Service: Endoscopy;  Laterality: N/A;  . COLONOSCOPY N/A 06/28/2015   Procedure: COLONOSCOPY;  Surgeon: Clarene Essex, MD;  Location[facility]COPY;  Service: Endoscopy;  Laterality: N/A;  . ESOPHAGOGASTRODUODENOSCOPY N/A 12/08/2013   Procedure: ESOPHAGOGASTRODUODENOSCOPY (EGD);  Surgeon: Saralyn Pilar  Renee Ramus, MD;  Location: [facility];  Service: Endoscopy;  Laterality: N/A;  . GIVENS CAPSULE STUDY N/A 12/08/2013   Procedure: GIVENS CAPSULE STUDY;  Surgeon: [name], MD;  Location: [facility];  Service: Endoscopy;  Laterality: N/A;  . LEFT HEART CATH AND CORONARY ANGIOGRAPHY N/A 12/03/2017   Procedure: LEFT HEART CATH AND CORONARY ANGIOGRAPHY;  Surgeon: Nelva Bush, MD;  Location: [facility] CV LAB;  Service: Cardiovascular;  Laterality: N/A;  . LIPOMA EXCISION  01/28/11   neck     OB History   No obstetric history on file.     Family History  Problem Relation Age of Onset  . Diabetes type II Other   . Kidney failure Other   . Kidney failure Son     Social History   Tobacco Use  . Smoking status: Former Smoker    Packs/day: 0.25    Types: Cigarettes  . Smokeless tobacco: Never Used  Substance Use Topics  . Alcohol use: Yes    Alcohol/week: 0.0 standard drinks    Comment: occasionally  . Drug use: No    Home Medications Prior to Admission medications   Medication Sig Start Date End Date Taking? Authorizing Provider  albuterol (PROVENTIL HFA;VENTOLIN HFA) 108 (90 Base) MCG/ACT inhaler Inhale 2 puffs into the lungs every 6 (six) hours as needed for wheezing or shortness of breath. 02/24/18  Yes Rizwan, Eunice Blase, MD  amLODipine (NORVASC) 5 MG tablet Take 1 tablet (5 mg total) by mouth 2 (two) times daily. 03/08/19  Yes Weaver, Scott T, PA-C  calcitRIOL (ROCALTROL) 0.25 MCG capsule Take 0.25 mcg by mouth daily.  12/24/18  Yes [provider]  Cod Liver Oil 1000 MG CAPS Take 1,000 mg by mouth daily.    Yes [provider]  ELIQUIS 5 MG TABS tablet TAKE 1 TABLET(5 MG) BY MOUTH TWICE DAILY Patient taking differently: Take 5 mg by mouth 2 (two) times daily.  05/20/19  Yes Nahser, Wonda Cheng, MD  escitalopram (LEXAPRO) 10 MG tablet TAKE 1 TABLET(10 MG) BY MOUTH DAILY Patient taking differently: Take 10 mg by mouth daily. TAKE 1 TABLET(10 MG) BY MOUTH DAILY 08/31/19  Yes Andrena Mews T, MD  furosemide (LASIX) 40 MG tablet Take 0.5 tablets (20 mg total) by mouth daily. 11/04/18  Yes End, Harrell Gave, MD  ipratropium-albuterol (DUONEB) 0.5-2.5 (3) MG/3ML SOLN Take 3 mLs by nebulization every 4 (four) hours as needed. Patient taking differently: Take 3 mLs by nebulization every 4 (four) hours as needed (wheezing/shortness of  breath).  08/27/18  Yes Raylene Everts, MD  irbesartan (AVAPRO) 150 MG tablet TAKE 1 TABLET BY MOUTH EVERY DAY Patient taking differently: Take 150 mg by mouth daily.  08/16/19  Yes Weaver, Scott T, PA-C  isosorbide mononitrate (IMDUR) 30 MG 24 hr tablet Take 1 tablet (30 mg total) by mouth daily. 01/10/19 01/10/20 Yes Weaver, Scott T, PA-C  metoprolol succinate (TOPROL-XL) 25 MG 24 hr tablet TAKE 1 TABLET BY MOUTH EVERY DAY(KEEP UPCOMING APPT IN MAY FOR FUTURE REFILLS) Patient taking differently: Take 25 mg by mouth daily.  04/12/19  Yes Weaver, Scott T, PA-C  Multiple Vitamin (MULTIVITAMIN WITH MINERALS) TABS tablet Take 1 tablet by mouth daily.   Yes [provider]  nitroGLYCERIN (NITROSTAT) 0.4 MG SL tablet Place 1 tablet (0.4 mg total) under the tongue every 5 (five) minutes as needed for chest pain. If do not resolve after 1 tablet, go to ED 10/13/17  Yes Smiley Houseman, MD  potassium chloride SA (KLOR-CON) 20 MEQ tablet Take 0.5 tablets (10 mEq total) by mouth daily. 07/15/19  Yes Weaver, Scott T, PA-C  rosuvastatin (CRESTOR) 20 MG tablet TAKE 1 TABLET BY MOUTH AT BEDTIME Patient taking differently: Take 20 mg by mouth at bedtime.  10/22/18  Yes [name], MD  Baclofen 5 MG TABS Take 1 tablet by mouth every 12 (twelve) hours as needed. Patient not taking: Reported on 05/31/2019 01/12/19   [name], MD    Allergies    Ace inhibitors, Lisinopril, and Tramadol  Review of Systems   Review of Systems  Constitutional: Positive for fatigue. Negative for chills and fever.  Respiratory: Negative for shortness of breath.   Cardiovascular: Negative for chest pain.  Gastrointestinal: Positive for abdominal pain, blood in stool, nausea and vomiting.  Genitourinary: Negative for dysuria, frequency and urgency.  Neurological: Positive for weakness (generalized) and light-headedness.  All other systems reviewed and are negative.   Physical Exam Updated Vital  Signs BP (!) 145/81   Pulse 70   Temp 97.9 F (36.6 C) (Oral)   Resp 17   Ht 5\' 4"  (1.626 m)   Wt 69.4 kg   SpO2 100%   BMI 26.26 kg/m   Physical Exam Vitals and nursing note reviewed.  Constitutional:      General: She is not in acute distress.    Appearance: She is well-developed.  HENT:     Head: Normocephalic and atraumatic.  Eyes:     General:        Right eye: No discharge.        Left eye: No discharge.     Conjunctiva/sclera: Conjunctivae normal.  Neck:     Vascular: No JVD.     Trachea: No tracheal deviation.  Cardiovascular:     Rate and Rhythm: Normal rate. Rhythm irregular.  Pulmonary:     Effort: Pulmonary effort is normal.     Breath sounds: Normal breath sounds.  Abdominal:     General: Abdomen is protuberant. Bowel sounds are decreased. There is no distension.     Palpations: Abdomen is soft.     Tenderness: There is abdominal tenderness in the epigastric area, periumbilical area and suprapubic area. There is no right CVA tenderness, left CVA tenderness, guarding or rebound.  Musculoskeletal:     Cervical back: Neck supple.  Skin:    General: Skin is warm and dry.     Findings: No erythema.  Neurological:     Mental Status: She is alert.  Psychiatric:        Behavior: Behavior normal.     ED Results / Procedures / Treatments   Labs (all labs ordered are listed, but only abnormal results are displayed) Labs Reviewed  COMPREHENSIVE METABOLIC PANEL - Abnormal; Notable for the following components:      Result Value   Chloride 112 (*)    Glucose, Bld 100 (*)    BUN 69 (*)    Creatinine, Ser 2.01 (*)    Calcium 8.8 (*)    Total Protein 5.2 (*)    Albumin 2.5 (*)    GFR calc non Af Amer 24 (*)    GFR calc Af Amer 28 (*)    All other components within normal limits  CBC - Abnormal; Notable for the following components:   RBC 2.94 (*)    Hemoglobin 8.9 (*)    HCT 27.4 (*)    All other components within normal limits  POC OCCULT BLOOD,  ED -  Abnormal; Notable for the following components:   Fecal Occult Bld POSITIVE (*)    All other components within normal limits  SARS CORONAVIRUS 2 (TAT 6-24 HRS)  LIPASE, BLOOD  URINALYSIS, ROUTINE W REFLEX MICROSCOPIC  TYPE AND SCREEN    EKG EKG Interpretation  Date/Time:  Sunday September 04 2019 18:31:20 EST Ventricular Rate:  62 PR Interval:    QRS Duration: 105 QT Interval:  421 QTC Calculation: 428 R Axis:   2 Text Interpretation: Atrial fibrillation LVH with secondary repolarization abnormality since last tracing no significant change Confirmed by Daleen Bo [phone]) on 09/04/2019 7:38:23 PM   Radiology CT ABDOMEN PELVIS WO CONTRAST  Result Date: 09/04/2019 CLINICAL DATA:  Abdominal pain and GI bleeding. Question diverticulitis. EXAM: CT ABDOMEN AND PELVIS WITHOUT CONTRAST TECHNIQUE: Multidetector CT imaging of the abdomen and pelvis was performed following the standard protocol without IV contrast. COMPARISON:  None. FINDINGS: Lower chest: Cardiomegaly. Small amount of pericardial fluid. Lung bases are clear. Hepatobiliary: Suspicion of 1 or 2 small gallstones dependent in the gallbladder. No sign of cholecystitis or obstruction. Liver parenchyma is normal except for a few small calcified granulomas. Pancreas: Normal Spleen: Normal Adrenals/Urinary Tract: 1.3 cm adrenal adenoma on the right. No significant renal parenchymal finding. Some renal vascular calcification. No hydronephrosis. Bladder is normal. Stomach/Bowel: Diverticulosis of the left colon. No sign of active diverticulitis. Muscular hypertrophy in the sigmoid region, typical of diverticulosis. Vascular/Lymphatic: Aortic atherosclerosis. IVC is normal. No retroperitoneal adenopathy. Reproductive: No pelvic mass.  Previous hysterectomy. Other: None Musculoskeletal: Mild lower lumbar degenerative changes considering age. IMPRESSION: Diverticulosis of the descending and sigmoid colon but no CT evidence of diverticulitis. Low  level diverticulitis can be inapparent at imaging. Aortic atherosclerosis. Question 1 or 2 small gallstones dependent in the gallbladder. No sign of cholecystitis or obstruction. Cardiomegaly and small amount of pericardial fluid. 1.3 cm right adrenal adenoma. Electronically Signed   By: Nelson Chimes M.D.   On: 09/04/2019 21:21    Procedures Procedures (including critical care time)  Medications Ordered in ED Medications  iohexol (OMNIPAQUE) 9 MG/ML oral solution 1,000 mL (has no administration in time range)  iohexol (OMNIPAQUE) 9 MG/ML oral solution (has no administration in time range)  pantoprazole (PROTONIX) 80 mg in sodium chloride 0.9 % 100 mL IVPB (has no administration in time range)  pantoprazole (PROTONIX) 80 mg in sodium chloride 0.9 % 250 mL (0.32 mg/mL) infusion (has no administration in time range)  pantoprazole (PROTONIX) injection 40 mg (has no administration in time range)  iohexol (OMNIPAQUE) 9 MG/ML oral solution 500 mL (500 mLs Oral Contrast Given 09/04/19 2056)  sodium chloride 0.9 % bolus 500 mL (0 mLs Intravenous Stopped 09/04/19 1910)    ED Course  I have reviewed the triage vital signs and the nursing notes.  Pertinent labs & imaging results that were available during my care of the patient were reviewed by me and considered in my medical decision making (see chart for details).    MDM Rules/Calculators/A&P                      Patient presenting for evaluation of black stools, midline abdominal pain, nausea and vomiting.  She also notes generalized weakness that began today.  Has been a little lightheaded but no syncope.  She is afebrile, vital signs are stable.  She is nontoxic in appearance.  She has midline abdominal tenderness but no peritoneal signs noted.  Point-of-care Hemoccult was obtained by  RN and per her report she had no frank rectal bleeding, scant amount of light brown stool in the rectal vault.  She did have a small external hemorrhoid which was not  thrombosed or bleeding per her report.  She does have heme positive stools today.  She cannot remember the last time she had a colonoscopy.  Will obtain lab work and CT scan of the abdomen and pelvis and reassess.  EKG shows atrial fibrillation, no acute ischemic abnormalities.  Lab work reviewed by me shows no leukocytosis, hemoglobin of 8.9 which is down 2 g from her baseline although most recent set of labs that I can see is from 1 year ago.  She has a history of CKD, looks like baseline creatinine is around 1.8 but today is 2.01.  However her BUN is markedly elevated above baseline today at 69 concerning for dehydration versus upper GI bleed.  Lipase is within normal limits.  She has not been able to provide a UA.  9:00PM CONSULT: Spoke with Dr. Cristina Gong with [facility] gastroenterology who recommends keeping the patient n.p.o., starting on Protonix bolus and infusion, and they will plan to see her in the morning with plan for endoscopy.  Given she is currently hemodynamically stable no indication for emergent GI consultation.  Signed out care to oncoming provider PA Henderly.  Awaiting results of CT abdomen and pelvis.  If no acute surgical abdominal pathology then plan to call hospitalist service for admission.  Patient seen and evaluated by Dr. Eulis Foster who agrees with assessment and plan at this time.   Final Clinical Impression(s) / ED Diagnoses Final diagnoses:  Acute GI bleeding    Rx / DC Orders ED Discharge Orders    None       Debroah Baller 09/04/19 2141    Daleen Bo, MD 09/08/19 1720

## 2019-09-04 NOTE — ED Notes (Signed)
Pt denied being able to urinate at this time, will monitor. 

## 2019-09-05 ENCOUNTER — Inpatient Hospital Stay (HOSPITAL_COMMUNITY): Payer: Medicare HMO | Admitting: Anesthesiology

## 2019-09-05 ENCOUNTER — Encounter (HOSPITAL_COMMUNITY): Payer: Self-pay | Admitting: Family Medicine

## 2019-09-05 ENCOUNTER — Other Ambulatory Visit: Payer: Self-pay

## 2019-09-05 ENCOUNTER — Encounter (HOSPITAL_COMMUNITY)
Admission: EM | Disposition: A | Discharge status: Home / Self Care | Payer: Self-pay | Source: Home / Self Care | Attending: Internal Medicine

## 2019-09-05 DIAGNOSIS — E785 Hyperlipidemia, unspecified: Secondary | ICD-10-CM | POA: Diagnosis not present

## 2019-09-05 DIAGNOSIS — Z20822 Contact with and (suspected) exposure to covid-19: Secondary | ICD-10-CM | POA: Diagnosis not present

## 2019-09-05 DIAGNOSIS — K259 Gastric ulcer, unspecified as acute or chronic, without hemorrhage or perforation: Secondary | ICD-10-CM | POA: Diagnosis not present

## 2019-09-05 DIAGNOSIS — Z66 Do not resuscitate: Secondary | ICD-10-CM | POA: Diagnosis not present

## 2019-09-05 DIAGNOSIS — K3189 Other diseases of stomach and duodenum: Secondary | ICD-10-CM | POA: Diagnosis not present

## 2019-09-05 DIAGNOSIS — I4821 Permanent atrial fibrillation: Secondary | ICD-10-CM | POA: Diagnosis not present

## 2019-09-05 DIAGNOSIS — D62 Acute posthemorrhagic anemia: Secondary | ICD-10-CM | POA: Diagnosis not present

## 2019-09-05 DIAGNOSIS — I5032 Chronic diastolic (congestive) heart failure: Secondary | ICD-10-CM | POA: Diagnosis not present

## 2019-09-05 DIAGNOSIS — I130 Hypertensive heart and chronic kidney disease with heart failure and stage 1 through stage 4 chronic kidney disease, or unspecified chronic kidney disease: Secondary | ICD-10-CM | POA: Diagnosis not present

## 2019-09-05 DIAGNOSIS — N1832 Chronic kidney disease, stage 3b: Secondary | ICD-10-CM | POA: Diagnosis not present

## 2019-09-05 DIAGNOSIS — I2510 Atherosclerotic heart disease of native coronary artery without angina pectoris: Secondary | ICD-10-CM | POA: Diagnosis not present

## 2019-09-05 DIAGNOSIS — K254 Chronic or unspecified gastric ulcer with hemorrhage: Secondary | ICD-10-CM | POA: Diagnosis not present

## 2019-09-05 DIAGNOSIS — Z7901 Long term (current) use of anticoagulants: Secondary | ICD-10-CM | POA: Diagnosis not present

## 2019-09-05 DIAGNOSIS — K921 Melena: Secondary | ICD-10-CM | POA: Diagnosis not present

## 2019-09-05 DIAGNOSIS — K449 Diaphragmatic hernia without obstruction or gangrene: Secondary | ICD-10-CM | POA: Diagnosis not present

## 2019-09-05 DIAGNOSIS — K922 Gastrointestinal hemorrhage, unspecified: Secondary | ICD-10-CM | POA: Diagnosis not present

## 2019-09-05 DIAGNOSIS — K2950 Unspecified chronic gastritis without bleeding: Secondary | ICD-10-CM | POA: Diagnosis not present

## 2019-09-05 HISTORY — PX: BIOPSY: SHX5522

## 2019-09-05 HISTORY — PX: ESOPHAGOGASTRODUODENOSCOPY (EGD) WITH PROPOFOL: SHX5813

## 2019-09-05 LAB — URINALYSIS, ROUTINE W REFLEX MICROSCOPIC
Bacteria, UA: NONE SEEN
Bilirubin Urine: NEGATIVE
Glucose, UA: NEGATIVE mg/dL
Hgb urine dipstick: NEGATIVE
Ketones, ur: NEGATIVE mg/dL
Nitrite: NEGATIVE
Protein, ur: 100 mg/dL — AB
Specific Gravity, Urine: 1.01 (ref 1.005–1.030)
pH: 5 (ref 5.0–8.0)

## 2019-09-05 LAB — BASIC METABOLIC PANEL
Anion gap: 7 (ref 5–15)
BUN: 62 mg/dL — ABNORMAL HIGH (ref 8–23)
CO2: 22 mmol/L (ref 22–32)
Calcium: 8.2 mg/dL — ABNORMAL LOW (ref 8.9–10.3)
Chloride: 111 mmol/L (ref 98–111)
Creatinine, Ser: 2.06 mg/dL — ABNORMAL HIGH (ref 0.44–1.00)
GFR calc Af Amer: 27 mL/min — ABNORMAL LOW (ref >60)
GFR calc non Af Amer: 24 mL/min — ABNORMAL LOW (ref >60)
Glucose, Bld: 92 mg/dL (ref 70–99)
Potassium: 3.7 mmol/L (ref 3.5–5.1)
Sodium: 140 mmol/L (ref 135–145)

## 2019-09-05 LAB — CBC
HCT: 22.5 % — ABNORMAL LOW (ref 36.0–46.0)
Hemoglobin: 7.5 g/dL — ABNORMAL LOW (ref 12.0–15.0)
MCH: 30.7 pg (ref 26.0–34.0)
MCHC: 33.3 g/dL (ref 30.0–36.0)
MCV: 92.2 fL (ref 80.0–100.0)
Platelets: 144 10*3/uL — ABNORMAL LOW (ref 150–400)
RBC: 2.44 MIL/uL — ABNORMAL LOW (ref 3.87–5.11)
RDW: 13 % (ref 11.5–15.5)
WBC: 4.8 10*3/uL (ref 4.0–10.5)
nRBC: 0 % (ref 0.0–0.2)

## 2019-09-05 LAB — IRON AND TIBC
Iron: 32 ug/dL (ref 28–170)
Saturation Ratios: 13 % (ref 10.4–31.8)
TIBC: 240 ug/dL — ABNORMAL LOW (ref 250–450)
UIBC: 208 ug/dL

## 2019-09-05 LAB — SARS CORONAVIRUS 2 (TAT 6-24 HRS): SARS Coronavirus 2: NEGATIVE

## 2019-09-05 LAB — VITAMIN B12: Vitamin B-12: 254 pg/mL (ref 180–914)

## 2019-09-05 SURGERY — ESOPHAGOGASTRODUODENOSCOPY (EGD) WITH PROPOFOL
Anesthesia: Monitor Anesthesia Care

## 2019-09-05 MED ORDER — PANTOPRAZOLE SODIUM 40 MG IV SOLR
40.0000 mg | Freq: Two times a day (BID) | INTRAVENOUS | Status: DC
  Administered 2019-09-05: 40 mg via INTRAVENOUS
  Filled 2019-09-05 (×2): qty 40

## 2019-09-05 MED ORDER — ESCITALOPRAM OXALATE 10 MG PO TABS
10.0000 mg | ORAL_TABLET | Freq: Every day | ORAL | Status: DC
  Administered 2019-09-05 – 2019-09-06 (×2): 10 mg via ORAL
  Filled 2019-09-05 (×2): qty 1

## 2019-09-05 MED ORDER — ISOSORBIDE MONONITRATE ER 30 MG PO TB24
30.0000 mg | ORAL_TABLET | Freq: Every day | ORAL | Status: DC
  Administered 2019-09-05 – 2019-09-06 (×2): 30 mg via ORAL
  Filled 2019-09-05 (×2): qty 1

## 2019-09-05 MED ORDER — LACTATED RINGERS IV SOLN: INTRAVENOUS | Status: DC | PRN

## 2019-09-05 MED ORDER — SODIUM CHLORIDE 0.9 % IV SOLN
INTRAVENOUS | Status: DC
  Administered 2019-09-05: 500 mL via INTRAVENOUS

## 2019-09-05 MED ORDER — PROPOFOL 10 MG/ML IV BOLUS
INTRAVENOUS | Status: AC
  Filled 2019-09-05: qty 20

## 2019-09-05 MED ORDER — AMLODIPINE BESYLATE 5 MG PO TABS
5.0000 mg | ORAL_TABLET | Freq: Two times a day (BID) | ORAL | Status: DC
  Administered 2019-09-05 – 2019-09-06 (×3): 5 mg via ORAL
  Filled 2019-09-05 (×3): qty 1

## 2019-09-05 MED ORDER — PROPOFOL 500 MG/50ML IV EMUL
INTRAVENOUS | Status: DC | PRN
  Administered 2019-09-05: 125 ug/kg/min via INTRAVENOUS

## 2019-09-05 MED ORDER — PROPOFOL 500 MG/50ML IV EMUL
INTRAVENOUS | Status: AC
  Filled 2019-09-05: qty 50

## 2019-09-05 MED ORDER — FUROSEMIDE 20 MG PO TABS
20.0000 mg | ORAL_TABLET | Freq: Every day | ORAL | Status: DC
  Administered 2019-09-05 – 2019-09-06 (×2): 20 mg via ORAL
  Filled 2019-09-05 (×2): qty 1

## 2019-09-05 MED ORDER — PANTOPRAZOLE SODIUM 40 MG PO TBEC
40.0000 mg | DELAYED_RELEASE_TABLET | Freq: Two times a day (BID) | ORAL | Status: DC
  Administered 2019-09-05: 40 mg via ORAL
  Filled 2019-09-05: qty 1

## 2019-09-05 MED ORDER — POTASSIUM CHLORIDE CRYS ER 10 MEQ PO TBCR
10.0000 meq | EXTENDED_RELEASE_TABLET | Freq: Every day | ORAL | Status: DC
  Administered 2019-09-05: 10 meq via ORAL
  Filled 2019-09-05: qty 1

## 2019-09-05 MED ORDER — PROPOFOL 10 MG/ML IV BOLUS
INTRAVENOUS | Status: DC | PRN
  Administered 2019-09-05 (×2): 20 mg via INTRAVENOUS

## 2019-09-05 MED ORDER — IRBESARTAN 150 MG PO TABS
150.0000 mg | ORAL_TABLET | Freq: Every day | ORAL | Status: DC
  Administered 2019-09-05 – 2019-09-06 (×2): 150 mg via ORAL
  Filled 2019-09-05 (×2): qty 1

## 2019-09-05 MED ORDER — CALCITRIOL 0.25 MCG PO CAPS
0.2500 ug | ORAL_CAPSULE | Freq: Every day | ORAL | Status: DC
  Administered 2019-09-05 – 2019-09-06 (×2): 0.25 ug via ORAL
  Filled 2019-09-05 (×2): qty 1

## 2019-09-05 MED ORDER — ROSUVASTATIN CALCIUM 10 MG PO TABS
20.0000 mg | ORAL_TABLET | Freq: Every day | ORAL | Status: DC
  Administered 2019-09-05: 20 mg via ORAL
  Filled 2019-09-05: qty 2
  Filled 2019-09-05: qty 1

## 2019-09-05 MED ORDER — METOPROLOL SUCCINATE ER 25 MG PO TB24
25.0000 mg | ORAL_TABLET | Freq: Every day | ORAL | Status: DC
  Administered 2019-09-05 – 2019-09-06 (×2): 25 mg via ORAL
  Filled 2019-09-05 (×2): qty 1

## 2019-09-05 SURGICAL SUPPLY — 15 items

## 2019-09-05 NOTE — ED Notes (Signed)
Patient has returned from endo. She is alert, oriented x 4, and appears in no acute distress. Skin is warm and dry. She is able to ambulate.

## 2019-09-05 NOTE — Interval H&P Note (Signed)
History and Physical Interval Note: 73/female with transient melenic stool, FOBT positive, elevated BUN, anemia, was on Eliquis, last dose yesterday morning for an EGD.  09/05/2019 11:18 AM  Jennifer Foster  has presented today for EGD, with the diagnosis of melena and anemia.  The various methods of treatment have been discussed with the patient and family. After consideration of risks, benefits and other options for treatment, the patient has consented to  Procedure(s): ESOPHAGOGASTRODUODENOSCOPY (EGD) WITH PROPOFOL (N/A) as a surgical intervention.  The patient's history has been reviewed, patient examined, no change in status, stable for surgery.  I have reviewed the patient's chart and labs.  Questions were answered to the patient's satisfaction.     Jennifer Foster

## 2019-09-05 NOTE — Anesthesia Postprocedure Evaluation (Signed)
Anesthesia Post Note  Patient: Jennifer Foster  Procedure(s) Performed: ESOPHAGOGASTRODUODENOSCOPY (EGD) WITH PROPOFOL (N/A ) BIOPSY     Patient location during evaluation: PACU Anesthesia Type: MAC Level of consciousness: awake and alert Pain management: pain level controlled Vital Signs Assessment: post-procedure vital signs reviewed and stable Respiratory status: spontaneous breathing and respiratory function stable Cardiovascular status: stable Postop Assessment: no apparent nausea or vomiting Anesthetic complications: no    Last Vitals:  Vitals:   09/05/19 1230 09/05/19 1231  BP: (!) 112/33 (!) 112/33  Pulse: 64 (!) 58  Resp: 17 17  Temp:    SpO2: 100% 100%    Last Pain:  Vitals:   09/05/19 1210  TempSrc: Axillary  PainSc:                  [name] DANIEL

## 2019-09-05 NOTE — Op Note (Signed)
EGD was performed for melena and anemia.  Findings: Small hiatal hernia. Cratered ulcer, 10 mm in size, clean-based noted at incisura, biopsies taken, rule out malignancy.  Multiple superficial clean-based ulcers noted in antrum and pyloric area, biopsies taken to rule out H. Pylori.  Normal cardia and fundus on retroflexion. Erythematous duodenal bulb and first portion of the duodenum.    Recommendations: Full liquid diet today. Pathology has been sent as stat to rule out malignancy. PPI twice daily. If malignancy is noted, will need surgical evaluation. If pathology is benign, will need repeat endoscopy in 8 weeks to document healing of gastric ulcer. Hold Eliquis for now, monitor H&H and transfuse as needed.   Ronnette Juniper, MD

## 2019-09-05 NOTE — H&P (View-Only) (Signed)
Referring Provider: Dr. Shelly Coss Primary Care Physician:  [name], MD Primary Gastroenterologist: None (unassigned)  Reason for Consultation: Transient melena with anemia  HPI: Jennifer Foster is a 74 y.o. female admitted through the emergency room yesterday following a solitary episode of a large, black, liquid stool while on Eliquis, associated with presyncope but no frank syncope or abdominal pain.    The patient indicates that this morning, she had a stool that was normal in appearance.    Her hemoglobin has dropped from a baseline of approximately 11 when checked about a year ago, to 8.9 on admission and then to 7.5 following overnight hydration.  BUN was elevated above baseline to 69, although the patient does have chronic renal insufficiency with a creatinine of approximately 2.  No history of recent exposure to aspirin or nonsteroidal anti-inflammatory medications.  The patient states she has been on Eliquis for "a long time."  The patient has known diverticulosis and his been evaluated for GI bleeding on a couple of past occasions.  She had upper endoscopy by me in 2009 which showed mild nonsteroidal gastropathy, upper endoscopy in 2015 by Dr. Benson Norway which was negative, colonoscopy in 2015 by Dr. Benson Norway which showed pancolonic (primarily left-sided) diverticulosis, and colonoscopy again in October 2016 by Dr. Watt Climes which showed similar findings.   Past Medical History:  Diagnosis Date  . Abnormal stress test 12/03/2017  . Acute blood loss anemia 06/26/2015  . Acute respiratory failure with hypoxia ([place]) 02/21/2018  . Aortic atherosclerosis ([place]) 01/01/2018   CT 04/2017  . Asthma   . Atrial fibrillation ([place]) 10/13/2017   Newly diagnosed in Jan 2019 // Apixaban for anticoag // Apixaban held in 2019 for anemia but resumed; Hgb stable since restarting  . CAD (coronary artery disease) 11/27/2017   Nuc stress 3/19: anterior ischemia, ?inf scar, EF 50, intermediate risk // LHC  4/19: LAD mild dz, pLCx 35, OM2 30, mRCA 60, dRCA 65, LVEDP 15-20  . CAP (community acquired pneumonia) 02/21/2018  . Chronic diastolic CHF (congestive heart failure) ([place]) 03/23/2018   Echo 2/19: Moderate LVH, EF 76-16, grade 2 diastolic dysfunction, trivial MR, moderate to severe LAE, PASP 30  . Chronic kidney disease (CKD), stage III (moderate) 06/27/2015  . DIVERTICULAR BLEEDING, HX OF 10/14/2007   Colonoscopy 2008 showed diverticulosis.   . Dizziness 11/25/2016  . GERD (gastroesophageal reflux disease)   . Gout   . History of anemia   . History of asthma   . Hyperlipemia   . Hypertension   . Joint pain   . Lipoma   . Thoracic aortic aneurysm 04/03/2017   CT 8/18: ascending thoracic aorta 4.1 cm // unable to do CTA due to CKD // Chest MRA 05/2019: Ascending thoracic aorta 40 mm    Past Surgical History:  Procedure Laterality Date  . ABDOMINAL HYSTERECTOMY  1981   partial, per pt history  . COLONOSCOPY N/A 12/08/2013   Procedure: COLONOSCOPY;  Surgeon: [name], MD;  Location: [facility];  Service: Endoscopy;  Laterality: N/A;  . COLONOSCOPY N/A 06/28/2015   Procedure: COLONOSCOPY;  Surgeon: Clarene Essex, MD;  Location: [facility] ENDOSCOPY;  Service: Endoscopy;  Laterality: N/A;  . ESOPHAGOGASTRODUODENOSCOPY N/A 12/08/2013   Procedure: ESOPHAGOGASTRODUODENOSCOPY (EGD);  Surgeon: [name], MD;  Location: [facility] ENDOSCOPY;  Service: Endoscopy;  Laterality: N/A;  . GIVENS CAPSULE STUDY N/A 12/08/2013   Procedure: GIVENS CAPSULE STUDY;  Surgeon: [name], MD;  Location: [place];  Service: Endoscopy;  Laterality:  N/A;  . LEFT HEART CATH AND CORONARY ANGIOGRAPHY N/A 12/03/2017   Procedure: LEFT HEART CATH AND CORONARY ANGIOGRAPHY;  Surgeon: Nelva Bush, MD;  Location: [place] [facility];  Service: Cardiovascular;  Laterality: N/A;  . LIPOMA EXCISION  01/28/11   neck    Prior to Admission medications   Medication Sig Start Date End Date Taking? Authorizing Provider  albuterol  (PROVENTIL HFA;VENTOLIN HFA) 108 (90 Base) MCG/ACT inhaler Inhale 2 puffs into the lungs every 6 (six) hours as needed for wheezing or shortness of breath. 02/24/18  Yes Rizwan, Eunice Blase, MD  amLODipine (NORVASC) 5 MG tablet Take 1 tablet (5 mg total) by mouth 2 (two) times daily. 03/08/19  Yes Weaver, Scott T, PA-C  calcitRIOL (ROCALTROL) 0.25 MCG capsule Take 0.25 mcg by mouth daily.  12/24/18  Yes [provider]  Cod Liver Oil 1000 MG CAPS Take 1,000 mg by mouth daily.    Yes [provider]  ELIQUIS 5 MG TABS tablet TAKE 1 TABLET(5 MG) BY MOUTH TWICE DAILY Patient taking differently: Take 5 mg by mouth 2 (two) times daily.  05/20/19  Yes Nahser, Wonda Cheng, MD  escitalopram (LEXAPRO) 10 MG tablet TAKE 1 TABLET(10 MG) BY MOUTH DAILY Patient taking differently: Take 10 mg by mouth daily. TAKE 1 TABLET(10 MG) BY MOUTH DAILY 08/31/19  Yes Andrena Mews T, MD  furosemide (LASIX) 40 MG tablet Take 0.5 tablets (20 mg total) by mouth daily. 11/04/18  Yes End, Harrell Gave, MD  ipratropium-albuterol (DUONEB) 0.5-2.5 (3) MG/3ML SOLN Take 3 mLs by nebulization every 4 (four) hours as needed. Patient taking differently: Take 3 mLs by nebulization every 4 (four) hours as needed (wheezing/shortness of breath).  08/27/18  Yes Raylene Everts, MD  irbesartan (AVAPRO) 150 MG tablet TAKE 1 TABLET BY MOUTH EVERY DAY Patient taking differently: Take 150 mg by mouth daily.  08/16/19  Yes Weaver, Scott T, PA-C  isosorbide mononitrate (IMDUR) 30 MG 24 hr tablet Take 1 tablet (30 mg total) by mouth daily. 01/10/19 01/10/20 Yes Weaver, Scott T, PA-C  metoprolol succinate (TOPROL-XL) 25 MG 24 hr tablet TAKE 1 TABLET BY MOUTH EVERY DAY(KEEP UPCOMING APPT IN MAY FOR FUTURE REFILLS) Patient taking differently: Take 25 mg by mouth daily.  04/12/19  Yes Weaver, Scott T, PA-C  Multiple Vitamin (MULTIVITAMIN WITH MINERALS) TABS tablet Take 1 tablet by mouth daily.   Yes [provider]  nitroGLYCERIN  (NITROSTAT) 0.4 MG SL tablet Place 1 tablet (0.4 mg total) under the tongue every 5 (five) minutes as needed for chest pain. If do not resolve after 1 tablet, go to ED 10/13/17  Yes Smiley Houseman, MD  potassium chloride SA (KLOR-CON) 20 MEQ tablet Take 0.5 tablets (10 mEq total) by mouth daily. 07/15/19  Yes Weaver, Scott T, PA-C  rosuvastatin (CRESTOR) 20 MG tablet TAKE 1 TABLET BY MOUTH AT BEDTIME Patient taking differently: Take 20 mg by mouth at bedtime.  10/22/18  Yes [name], MD  Baclofen 5 MG TABS Take 1 tablet by mouth every 12 (twelve) hours as needed. Patient not taking: Reported on 05/31/2019 01/12/19   [name], MD    Current Facility-Administered Medications  Medication Dose Route Frequency Provider Last Rate Last Admin  . amLODipine (NORVASC) tablet 5 mg  5 mg Oral BID Tu, Ching T, DO   5 mg at 09/05/19 0919  . calcitRIOL (ROCALTROL) capsule 0.25 mcg  0.25 mcg Oral Daily Tu, Ching T, DO   0.25 mcg at 09/05/19  0920  . escitalopram (LEXAPRO) tablet 10 mg  10 mg Oral Daily Tu, Ching T, DO   10 mg at 09/05/19 0920  . furosemide (LASIX) tablet 20 mg  20 mg Oral Daily Tu, Ching T, DO   20 mg at 09/05/19 8676  . irbesartan (AVAPRO) tablet 150 mg  150 mg Oral Daily Tu, Ching T, DO   150 mg at 09/05/19 0921  . isosorbide mononitrate (IMDUR) 24 hr tablet 30 mg  30 mg Oral Daily Tu, Ching T, DO   30 mg at 09/05/19 0920  . metoprolol succinate (TOPROL-XL) 24 hr tablet 25 mg  25 mg Oral Daily Tu, Ching T, DO   25 mg at 09/05/19 0920  . pantoprazole (PROTONIX) 80 mg in sodium chloride 0.9 % 250 mL (0.32 mg/mL) infusion  8 mg/hr Intravenous Continuous Tu, Ching T, DO 25 mL/hr at 09/05/19 0921 8 mg/hr at 09/05/19 0921  . potassium chloride (KLOR-CON) CR tablet 10 mEq  10 mEq Oral Daily Tu, Ching T, DO   10 mEq at 09/05/19 0919  . rosuvastatin (CRESTOR) tablet 20 mg  20 mg Oral QHS Tu, Ching T, DO       Current Outpatient Medications  Medication Sig Dispense Refill  .  albuterol (PROVENTIL HFA;VENTOLIN HFA) 108 (90 Base) MCG/ACT inhaler Inhale 2 puffs into the lungs every 6 (six) hours as needed for wheezing or shortness of breath. 1 Inhaler 2  . amLODipine (NORVASC) 5 MG tablet Take 1 tablet (5 mg total) by mouth 2 (two) times daily. 180 tablet 3  . calcitRIOL (ROCALTROL) 0.25 MCG capsule Take 0.25 mcg by mouth daily.     [name] Cod Liver Oil 1000 MG CAPS Take 1,000 mg by mouth daily.     [name] ELIQUIS 5 MG TABS tablet TAKE 1 TABLET(5 MG) BY MOUTH TWICE DAILY (Patient taking differently: Take 5 mg by mouth 2 (two) times daily. ) 180 tablet 1  . escitalopram (LEXAPRO) 10 MG tablet TAKE 1 TABLET(10 MG) BY MOUTH DAILY (Patient taking differently: Take 10 mg by mouth daily. TAKE 1 TABLET(10 MG) BY MOUTH DAILY) 90 tablet 1  . furosemide (LASIX) 40 MG tablet Take 0.5 tablets (20 mg total) by mouth daily. 45 tablet 1  . ipratropium-albuterol (DUONEB) 0.5-2.5 (3) MG/3ML SOLN Take 3 mLs by nebulization every 4 (four) hours as needed. (Patient taking differently: Take 3 mLs by nebulization every 4 (four) hours as needed (wheezing/shortness of breath). ) 360 mL 0  . irbesartan (AVAPRO) 150 MG tablet TAKE 1 TABLET BY MOUTH EVERY DAY (Patient taking differently: Take 150 mg by mouth daily. ) 30 tablet 5  . isosorbide mononitrate (IMDUR) 30 MG 24 hr tablet Take 1 tablet (30 mg total) by mouth daily. 30 tablet 11  . metoprolol succinate (TOPROL-XL) 25 MG 24 hr tablet TAKE 1 TABLET BY MOUTH EVERY DAY(KEEP UPCOMING APPT IN MAY FOR FUTURE REFILLS) (Patient taking differently: Take 25 mg by mouth daily. ) 90 tablet 2  . Multiple Vitamin (MULTIVITAMIN WITH MINERALS) TABS tablet Take 1 tablet by mouth daily.    . nitroGLYCERIN (NITROSTAT) 0.4 MG SL tablet Place 1 tablet (0.4 mg total) under the tongue every 5 (five) minutes as needed for chest pain. If do not resolve after 1 tablet, go to ED 10 tablet 0  . potassium chloride SA (KLOR-CON) 20 MEQ tablet Take 0.5 tablets (10 mEq total) by mouth  daily. 90 tablet 0  . rosuvastatin (CRESTOR) 20 MG tablet TAKE 1 TABLET BY MOUTH AT  BEDTIME (Patient taking differently: Take 20 mg by mouth at bedtime. ) 90 tablet 2  . Baclofen 5 MG TABS Take 1 tablet by mouth every 12 (twelve) hours as needed. (Patient not taking: Reported on 05/31/2019) 30 tablet 0    Allergies as of 09/04/2019 - Review Complete 09/04/2019  Allergen Reaction Noted  . Ace inhibitors Swelling and Other (See Comments) 01/16/2011  . Lisinopril Swelling and Other (See Comments) 04/11/2012  . Tramadol Itching 08/05/2012    Family History  Problem Relation Age of Onset  . Diabetes type II Other   . Kidney failure Other   . Kidney failure Son     Social History   Socioeconomic History  . Marital status: Married    Spouse name: Not on file  . Number of children: Not on file  . Years of education: Not on file  . Highest education level: Not on file  Occupational History  . Occupation: unemployed  Tobacco Use  . Smoking status: Former Smoker    Packs/day: 0.25    Types: Cigarettes  . Smokeless tobacco: Never Used  Substance and Sexual Activity  . Alcohol use: Yes    Alcohol/week: 0.0 standard drinks    Comment: occasionally  . Drug use: No  . Sexual activity: Yes  Other Topics Concern  . Not on file  Social History Narrative   Lives with husband Kitzia Camus who is also an [facility] patient.  History of Marijuana and crack use.  Had cocaine + in May 2012   Social Determinants of Health   Financial Resource Strain:   . Difficulty of Paying Living Expenses: Not on file  Food Insecurity: No Food Insecurity  . Worried About Charity fundraiser in the Last Year: Never true  . Ran Out of Food in the Last Year: Never true  Transportation Needs: Unmet Transportation Needs  . Lack of Transportation (Medical): Yes  . Lack of Transportation (Non-Medical): Yes  Physical Activity:   . Days of Exercise per Week: Not on file  . Minutes of Exercise per Session: Not on file   Stress:   . Feeling of Stress : Not on file  Social Connections:   . Frequency of Communication with Friends and Family: Not on file  . Frequency of Social Gatherings with Friends and Family: Not on file  . Attends Religious Services: Not on file  . Active Member of Clubs or Organizations: Not on file  . Attends Archivist Meetings: Not on file  . Marital Status: Not on file  Intimate Partner Violence:   . Fear of Current or Ex-Partner: Not on file  . Emotionally Abused: Not on file  . Physically Abused: Not on file  . Sexually Abused: Not on file    Review of Systems: Negative for chest pain, shortness of breath, abdominal pain, skin rashes or joint pain; positive for somewhat decreased appetite and possibly some mild to moderate weight loss recently, although nothing dramatic.  Physical Exam: Vital signs in last 24 hours: Temp:  [97.9 F (36.6 C)-98.3 F (36.8 C)] 98.3 F (36.8 C) (01/04 0610) Pulse Rate:  [62-91] 74 (01/04 0926) Resp:  [16-22] 20 (01/04 0926) BP: (112-145)/(44-81) 133/68 (01/04 0926) SpO2:  [97 %-100 %] 100 % (01/04 0926) Weight:  [69.4 kg] 69.4 kg (01/03 1832)   This is a well-nourished, pleasant African-American female who wants to go home.  She is in absolutely no distress.  Skin is warm and dry, she is alert and coherent,  well perfused.  Chest is clear, heart without murmur but probably somewhat irregular rhythm consistent with known underlying atrial fibrillation.  Abdomen soft, nontender, no organomegaly or masses appreciated.  No significant peripheral edema.  No evident focal neurologic deficits.  No skin rashes evident.  Intake/Output from previous day: 01/03 0701 - 01/04 0700 In: 500.3 [IV Piggyback:500.3] Out: -  Intake/Output this shift: No intake/output data recorded.  Lab Results: Recent Labs    09/04/19 1619 09/05/19 0538  WBC 5.9 4.8  HGB 8.9* 7.5*  HCT 27.4* 22.5*  PLT 182 144*   BMET Recent Labs    09/04/19 1619  09/05/19 0538  NA 143 140  K 3.5 3.7  CL 112* 111  CO2 22 22  GLUCOSE 100* 92  BUN 69* 62*  CREATININE 2.01* 2.06*  CALCIUM 8.8* 8.2*   LFT Recent Labs    09/04/19 1619  PROT 5.2*  ALBUMIN 2.5*  AST 28  ALT 17  ALKPHOS 52  BILITOT 0.9   PT/INR No results for input(s): LABPROT, INR in the last 72 hours.  Studies/Results: CT ABDOMEN PELVIS WO CONTRAST  Result Date: 09/04/2019 CLINICAL DATA:  Abdominal pain and GI bleeding. Question diverticulitis. EXAM: CT ABDOMEN AND PELVIS WITHOUT CONTRAST TECHNIQUE: Multidetector CT imaging of the abdomen and pelvis was performed following the standard protocol without IV contrast. COMPARISON:  None. FINDINGS: Lower chest: Cardiomegaly. Small amount of pericardial fluid. Lung bases are clear. Hepatobiliary: Suspicion of 1 or 2 small gallstones dependent in the gallbladder. No sign of cholecystitis or obstruction. Liver parenchyma is normal except for a few small calcified granulomas. Pancreas: Normal Spleen: Normal Adrenals/Urinary Tract: 1.3 cm adrenal adenoma on the right. No significant renal parenchymal finding. Some renal vascular calcification. No hydronephrosis. Bladder is normal. Stomach/Bowel: Diverticulosis of the left colon. No sign of active diverticulitis. Muscular hypertrophy in the sigmoid region, typical of diverticulosis. Vascular/Lymphatic: Aortic atherosclerosis. IVC is normal. No retroperitoneal adenopathy. Reproductive: No pelvic mass.  Previous hysterectomy. Other: None Musculoskeletal: Mild lower lumbar degenerative changes considering age. IMPRESSION: Diverticulosis of the descending and sigmoid colon but no CT evidence of diverticulitis. Low level diverticulitis can be inapparent at imaging. Aortic atherosclerosis. Question 1 or 2 small gallstones dependent in the gallbladder. No sign of cholecystitis or obstruction. Cardiomegaly and small amount of pericardial fluid. 1.3 cm right adrenal adenoma. Electronically Signed   By: Nelson Chimes M.D.   On: 09/04/2019 21:21    Impression: 1.  Transient melenic stool, confirmed to be Hemoccult positive, with rising BUN probably out of proportion to chronic renal insufficiency 2.  Posthemorrhagic anemia, presumably acute, superimposed on antecedent mild anemia presumably due to chronic disease (chronic kidney disease) 3.  Known extensive diverticulosis 4.  Prior history of anemia and GI blood loss without definite explanation on upper and lower tract evaluation  Plan: Endoscopic evaluation today.  Petra Kuba, purpose, risks reviewed carefully with the patient and also with her husband by telephone, and they are all agreeable to proceed.  Further management to depend on those findings.   LOS: 1 day   Youlanda Mighty Yohan Samons  09/05/2019, 10:29 AM   Pager [phone] If no answer or after 5 PM call [phone]

## 2019-09-05 NOTE — Brief Op Note (Signed)
09/04/2019 - 09/05/2019  12:20 PM  PATIENT:  Jennifer Foster  74 y.o. female  PRE-OPERATIVE DIAGNOSIS:  melena and anemia  POST-OPERATIVE DIAGNOSIS:  Gastric Ulcers. S/P Bx Gastritis Small Hiatal Hernia  PROCEDURE:  Procedure(s): ESOPHAGOGASTRODUODENOSCOPY (EGD) WITH PROPOFOL (N/A) BIOPSY  SURGEON:  Surgeon(s) and Role:    Ronnette Juniper, MD - Primary  PHYSICIAN ASSISTANT:   ASSISTANTS:Chris Tamera Punt, Tech, Debi Mays,RN  ANESTHESIA:   MAC  EBL:  Minimal  BLOOD ADMINISTERED:none  DRAINS: none   LOCAL MEDICATIONS USED:  NONE  SPECIMEN:  Biopsy / Limited Resection  DISPOSITION OF SPECIMEN:  PATHOLOGY  COUNTS:  YES  TOURNIQUET:  * No tourniquets in log *  DICTATION: .Dragon Dictation  PLAN OF CARE: Admit to inpatient   PATIENT DISPOSITION:  PACU - hemodynamically stable.   Delay start of Pharmacological VTE agent (>24hrs) due to surgical blood loss or risk of bleeding: yes

## 2019-09-05 NOTE — Anesthesia Procedure Notes (Signed)
Procedure Name: MAC Date/Time: 09/05/2019 11:50 AM Performed by: Niel Hummer, CRNA Pre-anesthesia Checklist: Patient identified, Suction available, Emergency Drugs available and Patient being monitored Patient Re-evaluated:Patient Re-evaluated prior to induction Oxygen Delivery Method: Simple face mask

## 2019-09-05 NOTE — Anesthesia Preprocedure Evaluation (Signed)
Anesthesia Evaluation  Patient identified by MRN, date of birth, ID band Patient awake    Reviewed: Allergy & Precautions, H&P , NPO status , Patient's Chart, lab work & pertinent test results  History of Anesthesia Complications Negative for: history of anesthetic complications  Airway Mallampati: III  TM Distance: >3 FB Neck ROM: Full    Dental no notable dental hx. (+) Poor Dentition, Dental Advisory Given   Pulmonary asthma , Current Smoker and Patient abstained from smoking., former smoker,    Pulmonary exam normal breath sounds clear to auscultation       Cardiovascular hypertension, Pt. on medications and Pt. on home beta blockers + CAD  Normal cardiovascular exam+ dysrhythmias Atrial Fibrillation  Rhythm:Regular Rate:Normal  Nuc stress 3/19: anterior ischemia, ?inf scar, EF 50, intermediate risk // LHC 4/19: LAD mild dz, pLCx 35, OM2 30, mRCA 60, dRCA 65, LVEDP 15-20  Echo 2/19: Moderate LVH, EF 90-93, grade 2 diastolic dysfunction, trivial MR, moderate to severe LAE, PASP 30   Neuro/Psych PSYCHIATRIC DISORDERS Anxiety Depression negative neurological ROS     GI/Hepatic GERD  Medicated and Controlled,  Endo/Other  Hyperlipidemia  Renal/GU Renal InsufficiencyRenal disease  negative genitourinary   Musculoskeletal negative musculoskeletal ROS (+)   Abdominal (+) - obese,   Peds  Hematology  (+) anemia ,   Anesthesia Other Findings   Reproductive/Obstetrics negative OB ROS                             Anesthesia Physical  Anesthesia Plan  ASA: III  Anesthesia Plan: MAC   Post-op Pain Management:    Induction: Intravenous  PONV Risk Score and Plan: 2 and Ondansetron and Propofol infusion  Airway Management Planned: Natural Airway and Nasal Cannula  Additional Equipment:   Intra-op Plan:   Post-operative Plan:   Informed Consent: I have reviewed the patients History  and Physical, chart, labs and discussed the procedure including the risks, benefits and alternatives for the proposed anesthesia with the patient or authorized representative who has indicated his/her understanding and acceptance.     Dental advisory given  Plan Discussed with: Anesthesiologist, CRNA and Surgeon  Anesthesia Plan Comments:         Anesthesia Quick Evaluation

## 2019-09-05 NOTE — Transfer of Care (Signed)
Immediate Anesthesia Transfer of Care Note  Patient: Jennifer Foster  Procedure(s) Performed: ESOPHAGOGASTRODUODENOSCOPY (EGD) WITH PROPOFOL (N/A ) BIOPSY  Patient Location: PACU  Anesthesia Type:MAC  Level of Consciousness: awake, alert  and oriented  Airway & Oxygen Therapy: Patient Spontanous Breathing and Patient connected to face mask oxygen  Post-op Assessment: Report given to RN, Post -op Vital signs reviewed and stable and Patient moving all extremities X 4  Post vital signs: Reviewed and stable  Last Vitals:  Vitals Value Taken Time  BP    Temp    Pulse 69 09/05/19 1209  Resp    SpO2 100 % 09/05/19 1209  Vitals shown include unvalidated device data.  Last Pain:  Vitals:   09/05/19 1107  TempSrc: Oral  PainSc: 0-No pain         Complications: No apparent anesthesia complications

## 2019-09-05 NOTE — Progress Notes (Signed)
PROGRESS NOTE    Jennifer Foster  BHA:[id number] DOB: Sep 27, 1945 DOA: 09/04/2019 PCP: [name], MD   Brief Narrative:  Patient is a 37 female with history of coronary disease, diastolic CHF, A. fib on Eliquis, CKD stage III, chronic anemia, hypertension, aortic enlargement who presents from home with complaints of weakness.  She was also noticing black stools along with a bowel movement.  Also was having some abdominal discomfort and was feeling dizzy.  She was hemodynamically stable on presentation.  Hemoglobin was 8.9.  CT abdomen/pelvis showed diverticulosis but no diverticulitis.  FOBT came out to be positive.  GI consulted and she underwent EGD today.  Assessment & Plan:   Active Problems:   Hyperlipidemia   Acute on chronic blood loss anemia   Essential hypertension   Chronic kidney disease, stage 3 (HCC)   Anxiety and depression   Atrial fibrillation (HCC)   Chronic diastolic CHF (congestive heart failure) (HCC)   Upper GI bleed   Upper GI bleed: Presented with melanotic stools.  FOBT positive.  Hemoglobin was found to be 8.9 presentation.  Hemoglobin this morning in the range of 7.  On Eliquis at home.  Underwent EGD today.  Found to have a few nonbleeding superficial gastric ulcers with clean base.  Started on PPI twice daily.  Eliquis on hold.  Check CBC tomorrow.  Started on full liquid diet  Acute on chronic normocytic anemia: Secondary to GI bleed.  Hemoglobin this morning the range of 7.  Check CBC tomorrow.  CKD stage IIIb: Currently kidney function is at baseline.  Diastolic CHF: Euvolemic on exam.  Continue Lasix  Permanent Atrial fibrillation: Currently rate is controlled.  On Eliquis for anticoagulation.  On metoprolol for rate control  Hypertension: Currently blood pressure stable.  Continue amlodipine, irbesartan  Hyperlipidemia: Continue statin  Anxiety: Continue Lexapro           DVT prophylaxis:SCD Code Status: Full Family Communication:  None present at the bedside Disposition Plan: Likely home tomorrow   Consultants: GI  Procedures: EGD  Antimicrobials:  Anti-infectives (From admission, onward)   None      Subjective: Patient seen and examined the bedside this morning.  Hemodynamically stable.  Sitting on the bed.  Comfortable.  Denies any abdomen pain.  No active bleeding.  Objective: Vitals:   09/05/19 1210 09/05/19 1220 09/05/19 1230 09/05/19 1231  BP: (!) 118/58 (!) 99/43 (!) 112/33 (!) 112/33  Pulse: 73 (!) 59 64 (!) 58  Resp: (!) 21 (!) 24 17 17   Temp: 98.2 F (36.8 C)     TempSrc: Axillary     SpO2: 100% 100% 100% 100%  Weight:      Height:        Intake/Output Summary (Last 24 hours) at 09/05/2019 1329 Last data filed at 09/05/2019 1209 Gross per 24 hour  Intake 800.33 ml  Output --  Net 800.33 ml   Filed Weights   09/04/19 1832 09/05/19 1107  Weight: 69.4 kg 69.4 kg    Examination:  General exam: Appears calm and comfortable ,Not in distress,average built Respiratory system: Bilateral equal air entry, normal vesicular breath sounds, no wheezes or crackles  Cardiovascular system: Afib. No JVD, murmurs, rubs, gallops or clicks. No pedal edema. Gastrointestinal system: Abdomen is nondistended, soft and nontender. No organomegaly or masses felt. Normal bowel sounds heard. Central nervous system: Alert and oriented. No focal neurological deficits. Extremities: No edema, no clubbing ,no cyanosis, distal peripheral pulses palpable. Skin: No rashes, lesions  or ulcers,no icterus ,no pallor    Data Reviewed: I have personally reviewed following labs and imaging studies  CBC: Recent Labs  Lab 09/04/19 1619 09/05/19 0538  WBC 5.9 4.8  HGB 8.9* 7.5*  HCT 27.4* 22.5*  MCV 93.2 92.2  PLT 182 809*   Basic Metabolic Panel: Recent Labs  Lab 09/04/19 1619 09/05/19 0538  NA 143 140  K 3.5 3.7  CL 112* 111  CO2 22 22  GLUCOSE 100* 92  BUN 69* 62*  CREATININE 2.01* 2.06*  CALCIUM 8.8*  8.2*   GFR: Estimated Creatinine Clearance: 24 mL/min (A) (by C-G formula based on SCr of 2.06 mg/dL (H)). Liver Function Tests: Recent Labs  Lab 09/04/19 1619  AST 28  ALT 17  ALKPHOS 52  BILITOT 0.9  PROT 5.2*  ALBUMIN 2.5*   Recent Labs  Lab 09/04/19 1619  LIPASE 22   No results for input(s): AMMONIA in the last 168 hours. Coagulation Profile: No results for input(s): INR, PROTIME in the last 168 hours. Cardiac Enzymes: No results for input(s): CKTOTAL, CKMB, CKMBINDEX, TROPONINI in the last 168 hours. BNP (last 3 results) No results for input(s): PROBNP in the last 8760 hours. HbA1C: No results for input(s): HGBA1C in the last 72 hours. CBG: No results for input(s): GLUCAP in the last 168 hours. Lipid Profile: No results for input(s): CHOL, HDL, LDLCALC, TRIG, CHOLHDL, LDLDIRECT in the last 72 hours. Thyroid Function Tests: No results for input(s): TSH, T4TOTAL, FREET4, T3FREE, THYROIDAB in the last 72 hours. Anemia Panel: Recent Labs    09/05/19 0538  VITAMINB12 254  TIBC 240*  IRON 32   Sepsis Labs: No results for input(s): PROCALCITON, LATICACIDVEN in the last 168 hours.  Recent Results (from the past 240 hour(s))  SARS CORONAVIRUS 2 (TAT 6-24 HRS) Nasopharyngeal Nasopharyngeal Swab     Status: None   Collection Time: 09/04/19 10:38 PM   Specimen: Nasopharyngeal Swab  Result Value Ref Range Status   SARS Coronavirus 2 NEGATIVE NEGATIVE Final    Comment: (NOTE) SARS-CoV-2 target nucleic acids are NOT DETECTED. The SARS-CoV-2 RNA is generally detectable in upper and lower respiratory specimens during the acute phase of infection. Negative results do not preclude SARS-CoV-2 infection, do not rule out co-infections with other pathogens, and should not be used as the sole basis for treatment or other patient management decisions. Negative results must be combined with clinical observations, patient history, and epidemiological information. The  expected result is Negative. Fact Sheet for Patients: SugarRoll.be Fact Sheet for Healthcare Providers: https://www.woods-mathews.com/ This test is not yet approved or cleared by the [place] FDA and  has been authorized for detection and/or diagnosis of SARS-CoV-2 by FDA under an Emergency Use Authorization (EUA). This EUA will remain  in effect (meaning this test can be used) for the duration of the COVID-19 declaration under Section 56 4(b)(1) of the Act, 21 U.S.C. section 360bbb-3(b)(1), unless the authorization is terminated or revoked sooner. Performed at [facility], [place] [address], [place], [place] [address]          Radiology Studies: CT ABDOMEN PELVIS WO CONTRAST  Result Date: 09/04/2019 CLINICAL DATA:  Abdominal pain and GI bleeding. Question diverticulitis. EXAM: CT ABDOMEN AND PELVIS WITHOUT CONTRAST TECHNIQUE: Multidetector CT imaging of the abdomen and pelvis was performed following the standard protocol without IV contrast. COMPARISON:  None. FINDINGS: Lower chest: Cardiomegaly. Small amount of pericardial fluid. Lung bases are clear. Hepatobiliary: Suspicion of 1 or 2 small gallstones dependent  in the gallbladder. No sign of cholecystitis or obstruction. Liver parenchyma is normal except for a few small calcified granulomas. Pancreas: Normal Spleen: Normal Adrenals/Urinary Tract: 1.3 cm adrenal adenoma on the right. No significant renal parenchymal finding. Some renal vascular calcification. No hydronephrosis. Bladder is normal. Stomach/Bowel: Diverticulosis of the left colon. No sign of active diverticulitis. Muscular hypertrophy in the sigmoid region, typical of diverticulosis. Vascular/Lymphatic: Aortic atherosclerosis. IVC is normal. No retroperitoneal adenopathy. Reproductive: No pelvic mass.  Previous hysterectomy. Other: None Musculoskeletal: Mild lower lumbar degenerative changes considering age. IMPRESSION:  Diverticulosis of the descending and sigmoid colon but no CT evidence of diverticulitis. Low level diverticulitis can be inapparent at imaging. Aortic atherosclerosis. Question 1 or 2 small gallstones dependent in the gallbladder. No sign of cholecystitis or obstruction. Cardiomegaly and small amount of pericardial fluid. 1.3 cm right adrenal adenoma. Electronically Signed   By: Nelson Chimes M.D.   On: 09/04/2019 21:21        Scheduled Meds: . amLODipine  5 mg Oral BID  . calcitRIOL  0.25 mcg Oral Daily  . escitalopram  10 mg Oral Daily  . furosemide  20 mg Oral Daily  . irbesartan  150 mg Oral Daily  . isosorbide mononitrate  30 mg Oral Daily  . metoprolol succinate  25 mg Oral Daily  . pantoprazole  40 mg Oral BID  . potassium chloride SA  10 mEq Oral Daily  . rosuvastatin  20 mg Oral QHS   Continuous Infusions:   LOS: 1 day    Time spent: 35 mins.More than 50% of that time was spent in counseling and/or coordination of care.      Shelly Coss, MD [facility] Pager [phone]  If 7PM-7AM, please contact night-coverage [email or web address] Password [email or web address] 09/05/2019, 1:29 PM

## 2019-09-05 NOTE — Consult Note (Signed)
Referring Provider: Dr. Shelly Coss Primary Care Physician:  [name], MD Primary Gastroenterologist: None (unassigned)  Reason for Consultation: Transient melena with anemia  HPI: Jennifer Foster is a 74 y.o. female admitted through the emergency room yesterday following a solitary episode of a large, black, liquid stool while on Eliquis, associated with presyncope but no frank syncope or abdominal pain.    The patient indicates that this morning, she had a stool that was normal in appearance.    Her hemoglobin has dropped from a baseline of approximately 11 when checked about a year ago, to 8.9 on admission and then to 7.5 following overnight hydration.  BUN was elevated above baseline to 69, although the patient does have chronic renal insufficiency with a creatinine of approximately 2.  No history of recent exposure to aspirin or nonsteroidal anti-inflammatory medications.  The patient states she has been on Eliquis for "a long time."  The patient has known diverticulosis and his been evaluated for GI bleeding on a couple of past occasions.  She had upper endoscopy by me in 2009 which showed mild nonsteroidal gastropathy, upper endoscopy in 2015 by Dr. Benson Norway which was negative, colonoscopy in 2015 by Dr. Benson Norway which showed pancolonic (primarily left-sided) diverticulosis, and colonoscopy again in October 2016 by Dr. Watt Climes which showed similar findings.   Past Medical History:  Diagnosis Date  . Abnormal stress test 12/03/2017  . Acute blood loss anemia 06/26/2015  . Acute respiratory failure with hypoxia ([place]) 02/21/2018  . Aortic atherosclerosis ([place]) 01/01/2018   CT 04/2017  . Asthma   . Atrial fibrillation ([place]) 10/13/2017   Newly diagnosed in Jan 2019 // Apixaban for anticoag // Apixaban held in 2019 for anemia but resumed; Hgb stable since restarting  . CAD (coronary artery disease) 11/27/2017   Nuc stress 3/19: anterior ischemia, ?inf scar, EF 50, intermediate risk // LHC  4/19: LAD mild dz, pLCx 35, OM2 30, mRCA 60, dRCA 65, LVEDP 15-20  . CAP (community acquired pneumonia) 02/21/2018  . Chronic diastolic CHF (congestive heart failure) ([place]) 03/23/2018   Echo 2/19: Moderate LVH, EF 42-35, grade 2 diastolic dysfunction, trivial MR, moderate to severe LAE, PASP 30  . Chronic kidney disease (CKD), stage III (moderate) 06/27/2015  . DIVERTICULAR BLEEDING, HX OF 10/14/2007   Colonoscopy 2008 showed diverticulosis.   . Dizziness 11/25/2016  . GERD (gastroesophageal reflux disease)   . Gout   . History of anemia   . History of asthma   . Hyperlipemia   . Hypertension   . Joint pain   . Lipoma   . Thoracic aortic aneurysm 04/03/2017   CT 8/18: ascending thoracic aorta 4.1 cm // unable to do CTA due to CKD // Chest MRA 05/2019: Ascending thoracic aorta 40 mm    Past Surgical History:  Procedure Laterality Date  . ABDOMINAL HYSTERECTOMY  1981   partial, per pt history  . COLONOSCOPY N/A 12/08/2013   Procedure: COLONOSCOPY;  Surgeon: [name], MD;  Location: [place];  Service: Endoscopy;  Laterality: N/A;  . COLONOSCOPY N/A 06/28/2015   Procedure: COLONOSCOPY;  Surgeon: Clarene Essex, MD;  Location: [facility] ENDOSCOPY;  Service: Endoscopy;  Laterality: N/A;  . ESOPHAGOGASTRODUODENOSCOPY N/A 12/08/2013   Procedure: ESOPHAGOGASTRODUODENOSCOPY (EGD);  Surgeon: [name], MD;  Location: [facility] ENDOSCOPY;  Service: Endoscopy;  Laterality: N/A;  . GIVENS CAPSULE STUDY N/A 12/08/2013   Procedure: GIVENS CAPSULE STUDY;  Surgeon: [name], MD;  Location: [place];  Service: Endoscopy;  Laterality:  N/A;  . LEFT HEART CATH AND CORONARY ANGIOGRAPHY N/A 12/03/2017   Procedure: LEFT HEART CATH AND CORONARY ANGIOGRAPHY;  Surgeon: Nelva Bush, MD;  Location: [place] CV LAB;  Service: Cardiovascular;  Laterality: N/A;  . LIPOMA EXCISION  01/28/11   neck    Prior to Admission medications   Medication Sig Start Date End Date Taking? Authorizing Provider  albuterol  (PROVENTIL HFA;VENTOLIN HFA) 108 (90 Base) MCG/ACT inhaler Inhale 2 puffs into the lungs every 6 (six) hours as needed for wheezing or shortness of breath. 02/24/18  Yes Rizwan, Eunice Blase, MD  amLODipine (NORVASC) 5 MG tablet Take 1 tablet (5 mg total) by mouth 2 (two) times daily. 03/08/19  Yes Weaver, Scott T, PA-C  calcitRIOL (ROCALTROL) 0.25 MCG capsule Take 0.25 mcg by mouth daily.  12/24/18  Yes [provider]  Cod Liver Oil 1000 MG CAPS Take 1,000 mg by mouth daily.    Yes [provider]  ELIQUIS 5 MG TABS tablet TAKE 1 TABLET(5 MG) BY MOUTH TWICE DAILY Patient taking differently: Take 5 mg by mouth 2 (two) times daily.  05/20/19  Yes Nahser, Wonda Cheng, MD  escitalopram (LEXAPRO) 10 MG tablet TAKE 1 TABLET(10 MG) BY MOUTH DAILY Patient taking differently: Take 10 mg by mouth daily. TAKE 1 TABLET(10 MG) BY MOUTH DAILY 08/31/19  Yes Andrena Mews T, MD  furosemide (LASIX) 40 MG tablet Take 0.5 tablets (20 mg total) by mouth daily. 11/04/18  Yes End, Harrell Gave, MD  ipratropium-albuterol (DUONEB) 0.5-2.5 (3) MG/3ML SOLN Take 3 mLs by nebulization every 4 (four) hours as needed. Patient taking differently: Take 3 mLs by nebulization every 4 (four) hours as needed (wheezing/shortness of breath).  08/27/18  Yes Raylene Everts, MD  irbesartan (AVAPRO) 150 MG tablet TAKE 1 TABLET BY MOUTH EVERY DAY Patient taking differently: Take 150 mg by mouth daily.  08/16/19  Yes Weaver, Scott T, PA-C  isosorbide mononitrate (IMDUR) 30 MG 24 hr tablet Take 1 tablet (30 mg total) by mouth daily. 01/10/19 01/10/20 Yes Weaver, Scott T, PA-C  metoprolol succinate (TOPROL-XL) 25 MG 24 hr tablet TAKE 1 TABLET BY MOUTH EVERY DAY(KEEP UPCOMING APPT IN MAY FOR FUTURE REFILLS) Patient taking differently: Take 25 mg by mouth daily.  04/12/19  Yes Weaver, Scott T, PA-C  Multiple Vitamin (MULTIVITAMIN WITH MINERALS) TABS tablet Take 1 tablet by mouth daily.   Yes [provider]  nitroGLYCERIN  (NITROSTAT) 0.4 MG SL tablet Place 1 tablet (0.4 mg total) under the tongue every 5 (five) minutes as needed for chest pain. If do not resolve after 1 tablet, go to ED 10/13/17  Yes Smiley Houseman, MD  potassium chloride SA (KLOR-CON) 20 MEQ tablet Take 0.5 tablets (10 mEq total) by mouth daily. 07/15/19  Yes Weaver, Scott T, PA-C  rosuvastatin (CRESTOR) 20 MG tablet TAKE 1 TABLET BY MOUTH AT BEDTIME Patient taking differently: Take 20 mg by mouth at bedtime.  10/22/18  Yes [name], MD  Baclofen 5 MG TABS Take 1 tablet by mouth every 12 (twelve) hours as needed. Patient not taking: Reported on 05/31/2019 01/12/19   [name], MD    Current Facility-Administered Medications  Medication Dose Route Frequency Provider Last Rate Last Admin  . amLODipine (NORVASC) tablet 5 mg  5 mg Oral BID Tu, Ching T, DO   5 mg at 09/05/19 0919  . calcitRIOL (ROCALTROL) capsule 0.25 mcg  0.25 mcg Oral Daily Tu, Ching T, DO   0.25 mcg at 09/05/19  0920  . escitalopram (LEXAPRO) tablet 10 mg  10 mg Oral Daily Tu, Ching T, DO   10 mg at 09/05/19 0920  . furosemide (LASIX) tablet 20 mg  20 mg Oral Daily Tu, Ching T, DO   20 mg at 09/05/19 9562  . irbesartan (AVAPRO) tablet 150 mg  150 mg Oral Daily Tu, Ching T, DO   150 mg at 09/05/19 0921  . isosorbide mononitrate (IMDUR) 24 hr tablet 30 mg  30 mg Oral Daily Tu, Ching T, DO   30 mg at 09/05/19 0920  . metoprolol succinate (TOPROL-XL) 24 hr tablet 25 mg  25 mg Oral Daily Tu, Ching T, DO   25 mg at 09/05/19 0920  . pantoprazole (PROTONIX) 80 mg in sodium chloride 0.9 % 250 mL (0.32 mg/mL) infusion  8 mg/hr Intravenous Continuous Tu, Ching T, DO 25 mL/hr at 09/05/19 0921 8 mg/hr at 09/05/19 0921  . potassium chloride (KLOR-CON) CR tablet 10 mEq  10 mEq Oral Daily Tu, Ching T, DO   10 mEq at 09/05/19 0919  . rosuvastatin (CRESTOR) tablet 20 mg  20 mg Oral QHS Tu, Ching T, DO       Current Outpatient Medications  Medication Sig Dispense Refill  .  albuterol (PROVENTIL HFA;VENTOLIN HFA) 108 (90 Base) MCG/ACT inhaler Inhale 2 puffs into the lungs every 6 (six) hours as needed for wheezing or shortness of breath. 1 Inhaler 2  . amLODipine (NORVASC) 5 MG tablet Take 1 tablet (5 mg total) by mouth 2 (two) times daily. 180 tablet 3  . calcitRIOL (ROCALTROL) 0.25 MCG capsule Take 0.25 mcg by mouth daily.     [name] Cod Liver Oil 1000 MG CAPS Take 1,000 mg by mouth daily.     [name] ELIQUIS 5 MG TABS tablet TAKE 1 TABLET(5 MG) BY MOUTH TWICE DAILY (Patient taking differently: Take 5 mg by mouth 2 (two) times daily. ) 180 tablet 1  . escitalopram (LEXAPRO) 10 MG tablet TAKE 1 TABLET(10 MG) BY MOUTH DAILY (Patient taking differently: Take 10 mg by mouth daily. TAKE 1 TABLET(10 MG) BY MOUTH DAILY) 90 tablet 1  . furosemide (LASIX) 40 MG tablet Take 0.5 tablets (20 mg total) by mouth daily. 45 tablet 1  . ipratropium-albuterol (DUONEB) 0.5-2.5 (3) MG/3ML SOLN Take 3 mLs by nebulization every 4 (four) hours as needed. (Patient taking differently: Take 3 mLs by nebulization every 4 (four) hours as needed (wheezing/shortness of breath). ) 360 mL 0  . irbesartan (AVAPRO) 150 MG tablet TAKE 1 TABLET BY MOUTH EVERY DAY (Patient taking differently: Take 150 mg by mouth daily. ) 30 tablet 5  . isosorbide mononitrate (IMDUR) 30 MG 24 hr tablet Take 1 tablet (30 mg total) by mouth daily. 30 tablet 11  . metoprolol succinate (TOPROL-XL) 25 MG 24 hr tablet TAKE 1 TABLET BY MOUTH EVERY DAY(KEEP UPCOMING APPT IN MAY FOR FUTURE REFILLS) (Patient taking differently: Take 25 mg by mouth daily. ) 90 tablet 2  . Multiple Vitamin (MULTIVITAMIN WITH MINERALS) TABS tablet Take 1 tablet by mouth daily.    . nitroGLYCERIN (NITROSTAT) 0.4 MG SL tablet Place 1 tablet (0.4 mg total) under the tongue every 5 (five) minutes as needed for chest pain. If do not resolve after 1 tablet, go to ED 10 tablet 0  . potassium chloride SA (KLOR-CON) 20 MEQ tablet Take 0.5 tablets (10 mEq total) by mouth  daily. 90 tablet 0  . rosuvastatin (CRESTOR) 20 MG tablet TAKE 1 TABLET BY MOUTH AT  BEDTIME (Patient taking differently: Take 20 mg by mouth at bedtime. ) 90 tablet 2  . Baclofen 5 MG TABS Take 1 tablet by mouth every 12 (twelve) hours as needed. (Patient not taking: Reported on 05/31/2019) 30 tablet 0    Allergies as of 09/04/2019 - Review Complete 09/04/2019  Allergen Reaction Noted  . Ace inhibitors Swelling and Other (See Comments) 01/16/2011  . Lisinopril Swelling and Other (See Comments) 04/11/2012  . Tramadol Itching 08/05/2012    Family History  Problem Relation Age of Onset  . Diabetes type II Other   . Kidney failure Other   . Kidney failure Son     Social History   Socioeconomic History  . Marital status: Married    Spouse name: Not on file  . Number of children: Not on file  . Years of education: Not on file  . Highest education level: Not on file  Occupational History  . Occupation: unemployed  Tobacco Use  . Smoking status: Former Smoker    Packs/day: 0.25    Types: Cigarettes  . Smokeless tobacco: Never Used  Substance and Sexual Activity  . Alcohol use: Yes    Alcohol/week: 0.0 standard drinks    Comment: occasionally  . Drug use: No  . Sexual activity: Yes  Other Topics Concern  . Not on file  Social History Narrative   Lives with husband Kodee Drury who is also an [facility] patient.  History of Marijuana and crack use.  Had cocaine + in May 2012   Social Determinants of Health   Financial Resource Strain:   . Difficulty of Paying Living Expenses: Not on file  Food Insecurity: No Food Insecurity  . Worried About Charity fundraiser in the Last Year: Never true  . Ran Out of Food in the Last Year: Never true  Transportation Needs: Unmet Transportation Needs  . Lack of Transportation (Medical): Yes  . Lack of Transportation (Non-Medical): Yes  Physical Activity:   . Days of Exercise per Week: Not on file  . Minutes of Exercise per Session: Not on file   Stress:   . Feeling of Stress : Not on file  Social Connections:   . Frequency of Communication with Friends and Family: Not on file  . Frequency of Social Gatherings with Friends and Family: Not on file  . Attends Religious Services: Not on file  . Active Member of Clubs or Organizations: Not on file  . Attends Archivist Meetings: Not on file  . Marital Status: Not on file  Intimate Partner Violence:   . Fear of Current or Ex-Partner: Not on file  . Emotionally Abused: Not on file  . Physically Abused: Not on file  . Sexually Abused: Not on file    Review of Systems: Negative for chest pain, shortness of breath, abdominal pain, skin rashes or joint pain; positive for somewhat decreased appetite and possibly some mild to moderate weight loss recently, although nothing dramatic.  Physical Exam: Vital signs in last 24 hours: Temp:  [97.9 F (36.6 C)-98.3 F (36.8 C)] 98.3 F (36.8 C) (01/04 0610) Pulse Rate:  [62-91] 74 (01/04 0926) Resp:  [16-22] 20 (01/04 0926) BP: (112-145)/(44-81) 133/68 (01/04 0926) SpO2:  [97 %-100 %] 100 % (01/04 0926) Weight:  [69.4 kg] 69.4 kg (01/03 1832)   This is a well-nourished, pleasant African-American female who wants to go home.  She is in absolutely no distress.  Skin is warm and dry, she is alert and coherent,  well perfused.  Chest is clear, heart without murmur but probably somewhat irregular rhythm consistent with known underlying atrial fibrillation.  Abdomen soft, nontender, no organomegaly or masses appreciated.  No significant peripheral edema.  No evident focal neurologic deficits.  No skin rashes evident.  Intake/Output from previous day: 01/03 0701 - 01/04 0700 In: 500.3 [IV Piggyback:500.3] Out: -  Intake/Output this shift: No intake/output data recorded.  Lab Results: Recent Labs    09/04/19 1619 09/05/19 0538  WBC 5.9 4.8  HGB 8.9* 7.5*  HCT 27.4* 22.5*  PLT 182 144*   BMET Recent Labs    09/04/19 1619  09/05/19 0538  NA 143 140  K 3.5 3.7  CL 112* 111  CO2 22 22  GLUCOSE 100* 92  BUN 69* 62*  CREATININE 2.01* 2.06*  CALCIUM 8.8* 8.2*   LFT Recent Labs    09/04/19 1619  PROT 5.2*  ALBUMIN 2.5*  AST 28  ALT 17  ALKPHOS 52  BILITOT 0.9   PT/INR No results for input(s): LABPROT, INR in the last 72 hours.  Studies/Results: CT ABDOMEN PELVIS WO CONTRAST  Result Date: 09/04/2019 CLINICAL DATA:  Abdominal pain and GI bleeding. Question diverticulitis. EXAM: CT ABDOMEN AND PELVIS WITHOUT CONTRAST TECHNIQUE: Multidetector CT imaging of the abdomen and pelvis was performed following the standard protocol without IV contrast. COMPARISON:  None. FINDINGS: Lower chest: Cardiomegaly. Small amount of pericardial fluid. Lung bases are clear. Hepatobiliary: Suspicion of 1 or 2 small gallstones dependent in the gallbladder. No sign of cholecystitis or obstruction. Liver parenchyma is normal except for a few small calcified granulomas. Pancreas: Normal Spleen: Normal Adrenals/Urinary Tract: 1.3 cm adrenal adenoma on the right. No significant renal parenchymal finding. Some renal vascular calcification. No hydronephrosis. Bladder is normal. Stomach/Bowel: Diverticulosis of the left colon. No sign of active diverticulitis. Muscular hypertrophy in the sigmoid region, typical of diverticulosis. Vascular/Lymphatic: Aortic atherosclerosis. IVC is normal. No retroperitoneal adenopathy. Reproductive: No pelvic mass.  Previous hysterectomy. Other: None Musculoskeletal: Mild lower lumbar degenerative changes considering age. IMPRESSION: Diverticulosis of the descending and sigmoid colon but no CT evidence of diverticulitis. Low level diverticulitis can be inapparent at imaging. Aortic atherosclerosis. Question 1 or 2 small gallstones dependent in the gallbladder. No sign of cholecystitis or obstruction. Cardiomegaly and small amount of pericardial fluid. 1.3 cm right adrenal adenoma. Electronically Signed   By: Nelson Chimes M.D.   On: 09/04/2019 21:21    Impression: 1.  Transient melenic stool, confirmed to be Hemoccult positive, with rising BUN probably out of proportion to chronic renal insufficiency 2.  Posthemorrhagic anemia, presumably acute, superimposed on antecedent mild anemia presumably due to chronic disease (chronic kidney disease) 3.  Known extensive diverticulosis 4.  Prior history of anemia and GI blood loss without definite explanation on upper and lower tract evaluation  Plan: Endoscopic evaluation today.  Petra Kuba, purpose, risks reviewed carefully with the patient and also with her husband by telephone, and they are all agreeable to proceed.  Further management to depend on those findings.   LOS: 1 day   Youlanda Mighty Charman Blasco  09/05/2019, 10:29 AM   Pager [phone] If no answer or after 5 PM call [phone]

## 2019-09-05 NOTE — Op Note (Signed)
[facility] Patient Name: Jennifer Foster Procedure Date: 09/05/2019 MRN: [id number] Attending MD: Ronnette Juniper , MD Date of Birth: 08-19-1946 CSN: [id number] Age: 74 Admit Type: Inpatient Procedure:                Upper GI endoscopy Indications:              Melena Providers:                Ronnette Juniper, MD, Truddie Coco, RN, Elspeth Cho                            Tech., Technician, Cherylynn Ridges, Technician Referring MD:             [facility] Medicines:                Monitored Anesthesia Care Complications:            No immediate complications. Estimated blood loss:                            Minimal. Estimated Blood Loss:     Estimated blood loss was minimal. Procedure:                Pre-Anesthesia Assessment:                           - Prior to the procedure, a History and Physical                            was performed, and patient medications and                            allergies were reviewed. The patient's tolerance of                            previous anesthesia was also reviewed. The risks                            and benefits of the procedure and the sedation                            options and risks were discussed with the patient.                            All questions were answered, and informed consent                            was obtained. Prior Anticoagulants: The patient has                            taken Eliquis (apixaban), last dose was 1 day prior                            to procedure. ASA Grade Assessment: III - A patient  with severe systemic disease. After reviewing the                            risks and benefits, the patient was deemed in                            satisfactory condition to undergo the procedure.                           After obtaining informed consent, the endoscope was                            passed under direct vision. Throughout the   procedure, the patient's blood pressure, pulse, and                            oxygen saturations were monitored continuously. The                            [id number] ([id number]) Olympus double channel                            endoscope was introduced through the mouth, and                            advanced to the second part of duodenum. The upper                            GI endoscopy was accomplished without difficulty.                            The patient tolerated the procedure well. Scope In: Scope Out: Findings:      The examined esophagus was normal.      The Z-line was regular and was found 40 cm from the incisors.      One non-bleeding cratered gastric ulcer with a clean ulcer base (Forrest       Class III) was found at the incisura. The lesion was 10 mm in largest       dimension. Biopsies were taken with a cold forceps for histology, rule       out malignancy.      Few non-bleeding superficial gastric ulcers with a clean ulcer base       (Forrest Class III) were found in the gastric antrum and at the pylorus.       The largest lesion was 6 mm in largest dimension. Biopsies were taken       with a cold forceps for Helicobacter pylori testing.      The cardia and gastric fundus were normal on retroflexion.      Patchy moderately erythematous mucosa without active bleeding and with       no stigmata of bleeding was found in the duodenal bulb and in the first       portion of the duodenum.      A small hiatal hernia was present. Impression:               - Normal esophagus.                           -  Z-line regular, 40 cm from the incisors.                           - Non-bleeding gastric ulcer with a clean ulcer                            base (Forrest Class III). Biopsied.                           - Non-bleeding gastric ulcers with a clean ulcer                            base (Forrest Class III). Biopsied.                           - Erythematous duodenopathy.                            - Small hiatal hernia. Moderate Sedation:      Patient did not receive moderate sedation for this procedure, but       instead received monitored anesthesia care. Recommendation:           - Full liquid diet.                           - Continue present medications. PPI BID.Hold                            Eliquis.                           - Await pathology results. Has been sent as STAT.                           - Repeat upper endoscopy for surveillance if ulcer                            is non malignant, in 8 weeks.If ulcer is malignant,                            will need surgical evaluation. Procedure Code(s):        --- Professional ---                           [id number], Esophagogastroduodenoscopy, flexible,                            transoral; with biopsy, single or multiple Diagnosis Code(s):        --- Professional ---                           K25.9, Gastric ulcer, unspecified as acute or                            chronic, without hemorrhage or perforation  K31.89, Other diseases of stomach and duodenum                           K92.1, Melena (includes Hematochezia) CPT copyright 2019 American Medical Association. All rights reserved. The codes documented in this report are preliminary and upon coder review may  be revised to meet current compliance requirements. Ronnette Juniper, MD 09/05/2019 12:16:45 PM This report has been signed electronically. Number of Addenda: 0

## 2019-09-05 NOTE — ED Notes (Signed)
Pt transported to endo.  

## 2019-09-05 NOTE — ED Notes (Signed)
Attempted to obtain blood, but unable too.

## 2019-09-06 ENCOUNTER — Other Ambulatory Visit: Payer: Self-pay

## 2019-09-06 LAB — CBC WITH DIFFERENTIAL/PLATELET
Abs Immature Granulocytes: 0.03 10*3/uL (ref 0.00–0.07)
Basophils Absolute: 0 10*3/uL (ref 0.0–0.1)
Basophils Relative: 1 %
Eosinophils Absolute: 0.1 10*3/uL (ref 0.0–0.5)
Eosinophils Relative: 2 %
HCT: 22.1 % — ABNORMAL LOW (ref 36.0–46.0)
Hemoglobin: 7.3 g/dL — ABNORMAL LOW (ref 12.0–15.0)
Immature Granulocytes: 1 %
Lymphocytes Relative: 24 %
Lymphs Abs: 1 10*3/uL (ref 0.7–4.0)
MCH: 30.5 pg (ref 26.0–34.0)
MCHC: 33 g/dL (ref 30.0–36.0)
MCV: 92.5 fL (ref 80.0–100.0)
Monocytes Absolute: 0.5 10*3/uL (ref 0.1–1.0)
Monocytes Relative: 11 %
Neutro Abs: 2.7 10*3/uL (ref 1.7–7.7)
Neutrophils Relative %: 61 %
Platelets: 131 10*3/uL — ABNORMAL LOW (ref 150–400)
RBC: 2.39 MIL/uL — ABNORMAL LOW (ref 3.87–5.11)
RDW: 12.8 % (ref 11.5–15.5)
WBC: 4.4 10*3/uL (ref 4.0–10.5)
nRBC: 0 % (ref 0.0–0.2)

## 2019-09-06 LAB — FOLATE RBC
Folate, Hemolysate: 449 ng/mL
Folate, RBC: 1944 ng/mL (ref >498)
Hematocrit: 23.1 % — ABNORMAL LOW (ref 34.0–46.6)

## 2019-09-06 LAB — SURGICAL PATHOLOGY

## 2019-09-06 MED ORDER — SUCRALFATE 1 GM/10ML PO SUSP: 1.0000 g | Freq: Three times a day (TID) | ORAL | Status: DC

## 2019-09-06 MED ORDER — PANTOPRAZOLE SODIUM 40 MG PO TBEC: 40.0000 mg | DELAYED_RELEASE_TABLET | Freq: Two times a day (BID) | ORAL | 0 refills | Status: DC

## 2019-09-06 MED ORDER — APIXABAN 5 MG PO TABS: 5.0000 mg | ORAL_TABLET | Freq: Two times a day (BID) | ORAL | Status: DC

## 2019-09-06 MED ORDER — PANTOPRAZOLE SODIUM 40 MG PO TBEC
40.0000 mg | DELAYED_RELEASE_TABLET | Freq: Two times a day (BID) | ORAL | Status: DC
  Administered 2019-09-06: 40 mg via ORAL
  Filled 2019-09-06: qty 1

## 2019-09-06 MED ORDER — SUCRALFATE 1 GM/10ML PO SUSP: 1.0000 g | Freq: Three times a day (TID) | ORAL | 0 refills | Status: DC

## 2019-09-06 NOTE — Progress Notes (Signed)
Discussed with patient discharge instructions, they verbalized agreement and understanding.  Patient to leave in private vehicle with all belongings.

## 2019-09-06 NOTE — Discharge Summary (Signed)
Physician Discharge Summary  Jennifer Foster UDJ:[id number] DOB: [birth year] DOA: 09/04/2019  PCP: [name], MD  Admit date: 09/04/2019 Discharge date: 09/06/2019  Admitted From: Home Disposition:  Home  Discharge Condition:Stable CODE STATUS:FULL Diet recommendation: Heart Healthy  Brief/Interim Summary: Patient is a 67 female with history of coronary disease, diastolic CHF, A. fib on Eliquis, CKD stage III, chronic anemia, hypertension, aortic enlargement who presents from home with complaints of weakness.  She was also noticing black stools along with a bowel movement.  Also was having some abdominal discomfort and was feeling dizzy.  She was hemodynamically stable on presentation.  Hemoglobin was 8.9.  CT abdomen/pelvis showed diverticulosis but no diverticulitis.  FOBT came out to be positive.  GI consulted and she underwent EGD which showed few superficial gastric ulcers.  She has been started on Carafate and Protonix.  Her hemoglobin is in the range of 7 this morning.  She remains hemodynamically stable for discharge.  Following problems were addressed during her hospitalization:  Upper GI bleed: Presented with melanotic stools.  FOBT positive.  Hemoglobin was found to be 8.9 presentation.  Hemoglobin this morning in the range of 7.  On Eliquis at home.  Underwent EGD today.  Found to have a few nonbleeding superficial gastric ulcers with clean base.  Started on PPI twice daily.  Eliquis to be restarted after 5 days.  Check CBC in a week.  Acute on chronic normocytic anemia: Secondary to GI bleed.  Hemoglobin this morning the range of 7.    CKD stage IIIb: Currently kidney function is at baseline.  Diastolic CHF: Euvolemic on exam.  Continue Lasix  Permanent Atrial fibrillation: Currently rate is controlled.  On Eliquis for anticoagulation.  On metoprolol for rate control  Hypertension: Currently blood pressure stable.  Continue amlodipine, irbesartan  Hyperlipidemia:  Continue statin  Anxiety: Continue Lexapro   Discharge Diagnoses:  Active Problems:   Hyperlipidemia   Acute on chronic blood loss anemia   Essential hypertension   Chronic kidney disease, stage 3 (HCC)   Anxiety and depression   Atrial fibrillation (HCC)   Chronic diastolic CHF (congestive heart failure) (HCC)   Upper GI bleed    Discharge Instructions  Discharge Instructions    Diet - low sodium heart healthy   Complete by: As directed    Discharge instructions   Complete by: As directed    1)Please follow up with your PCP in a week. 2)Do a CBC test during the follow up with a PCP. 3)Take prescribed medications as instructed. 4)Start taking Eliiquis only on 09/12/19   Increase activity slowly   Complete by: As directed      Allergies as of 09/06/2019      Reactions   Ace Inhibitors Swelling, Other (See Comments)   Eyes swell   Lisinopril Swelling, Other (See Comments)   Swollen tongue    Tramadol Itching      Medication List    TAKE these medications   albuterol 108 (90 Base) MCG/ACT inhaler Commonly known as: VENTOLIN HFA Inhale 2 puffs into the lungs every 6 (six) hours as needed for wheezing or shortness of breath.   amLODipine 5 MG tablet Commonly known as: NORVASC Take 1 tablet (5 mg total) by mouth 2 (two) times daily.   apixaban 5 MG Tabs tablet Commonly known as: Eliquis Take 1 tablet (5 mg total) by mouth 2 (two) times daily. Please start taking only on 09/11/19 What changed: See the new instructions.   Baclofen  5 MG Tabs Take 1 tablet by mouth every 12 (twelve) hours as needed.   calcitRIOL 0.25 MCG capsule Commonly known as: ROCALTROL Take 0.25 mcg by mouth daily.   Cod Liver Oil 1000 MG Caps Take 1,000 mg by mouth daily.   escitalopram 10 MG tablet Commonly known as: LEXAPRO TAKE 1 TABLET(10 MG) BY MOUTH DAILY What changed:   how much to take  how to take this  when to take this   furosemide 40 MG tablet Commonly known as:  LASIX Take 0.5 tablets (20 mg total) by mouth daily.   ipratropium-albuterol 0.5-2.5 (3) MG/3ML Soln Commonly known as: DUONEB Take 3 mLs by nebulization every 4 (four) hours as needed. What changed: reasons to take this   irbesartan 150 MG tablet Commonly known as: AVAPRO TAKE 1 TABLET BY MOUTH EVERY DAY   isosorbide mononitrate 30 MG 24 hr tablet Commonly known as: IMDUR Take 1 tablet (30 mg total) by mouth daily.   metoprolol succinate 25 MG 24 hr tablet Commonly known as: TOPROL-XL TAKE 1 TABLET BY MOUTH EVERY DAY(KEEP UPCOMING APPT IN MAY FOR FUTURE REFILLS) What changed: See the new instructions.   multivitamin with minerals Tabs tablet Take 1 tablet by mouth daily.   nitroGLYCERIN 0.4 MG SL tablet Commonly known as: NITROSTAT Place 1 tablet (0.4 mg total) under the tongue every 5 (five) minutes as needed for chest pain. If do not resolve after 1 tablet, go to ED   pantoprazole 40 MG tablet Commonly known as: PROTONIX Take 1 tablet (40 mg total) by mouth 2 (two) times daily before a meal.   potassium chloride SA 20 MEQ tablet Commonly known as: KLOR-CON Take 0.5 tablets (10 mEq total) by mouth daily.   rosuvastatin 20 MG tablet Commonly known as: CRESTOR TAKE 1 TABLET BY MOUTH AT BEDTIME   sucralfate 1 GM/10ML suspension Commonly known as: CARAFATE Take 10 mLs (1 g total) by mouth 4 (four) times daily -  with meals and at bedtime.      Follow-up Information    [name], MD. Schedule an appointment as soon as possible for a visit in 1 week(s).   Specialty: Family Medicine Contact information: [place] [address] [phone]          Allergies  Allergen Reactions  . Ace Inhibitors Swelling and Other (See Comments)    Eyes swell  . Lisinopril Swelling and Other (See Comments)    Swollen tongue   . Tramadol Itching    Consultations: GI  Procedures/Studies: CT ABDOMEN PELVIS WO CONTRAST  Result Date:  09/04/2019 CLINICAL DATA:  Abdominal pain and GI bleeding. Question diverticulitis. EXAM: CT ABDOMEN AND PELVIS WITHOUT CONTRAST TECHNIQUE: Multidetector CT imaging of the abdomen and pelvis was performed following the standard protocol without IV contrast. COMPARISON:  None. FINDINGS: Lower chest: Cardiomegaly. Small amount of pericardial fluid. Lung bases are clear. Hepatobiliary: Suspicion of 1 or 2 small gallstones dependent in the gallbladder. No sign of cholecystitis or obstruction. Liver parenchyma is normal except for a few small calcified granulomas. Pancreas: Normal Spleen: Normal Adrenals/Urinary Tract: 1.3 cm adrenal adenoma on the right. No significant renal parenchymal finding. Some renal vascular calcification. No hydronephrosis. Bladder is normal. Stomach/Bowel: Diverticulosis of the left colon. No sign of active diverticulitis. Muscular hypertrophy in the sigmoid region, typical of diverticulosis. Vascular/Lymphatic: Aortic atherosclerosis. IVC is normal. No retroperitoneal adenopathy. Reproductive: No pelvic mass.  Previous hysterectomy. Other: None Musculoskeletal: Mild lower lumbar degenerative changes considering age.  IMPRESSION: Diverticulosis of the descending and sigmoid colon but no CT evidence of diverticulitis. Low level diverticulitis can be inapparent at imaging. Aortic atherosclerosis. Question 1 or 2 small gallstones dependent in the gallbladder. No sign of cholecystitis or obstruction. Cardiomegaly and small amount of pericardial fluid. 1.3 cm right adrenal adenoma. Electronically Signed   By: Nelson Chimes M.D.   On: 09/04/2019 21:21       Subjective:  Patient seen and examined the bedside this morning.  Hemodynamically stable for discharge today.  Discharge Exam: Vitals:   09/05/19 2048 09/06/19 0527  BP: (!) 135/55 (!) 144/77  Pulse: 69 64  Resp: 19 19  Temp: 98.6 F (37 C) 98.2 F (36.8 C)  SpO2: 100% 99%   Vitals:   09/05/19 1346 09/05/19 1542 09/05/19 2048  09/06/19 0527  BP: (!) 119/48 137/65 (!) 135/55 (!) 144/77  Pulse: (!) 57 (!) 53 69 64  Resp: 20 20 19 19   Temp:  98.1 F (36.7 C) 98.6 F (37 C) 98.2 F (36.8 C)  TempSrc:  Oral Oral Oral  SpO2: 100% 100% 100% 99%  Weight:      Height:        General: Pt is alert, awake, not in acute distress Cardiovascular: RRR, S1/S2 +, no rubs, no gallops Respiratory: CTA bilaterally, no wheezing, no rhonchi Abdominal: Soft, NT, ND, bowel sounds + Extremities: no edema, no cyanosis    The results of significant diagnostics from this hospitalization (including imaging, microbiology, ancillary and laboratory) are listed below for reference.     Microbiology: Recent Results (from the past 240 hour(s))  SARS CORONAVIRUS 2 (TAT 6-24 HRS) Nasopharyngeal Nasopharyngeal Swab     Status: None   Collection Time: 09/04/19 10:38 PM   Specimen: Nasopharyngeal Swab  Result Value Ref Range Status   SARS Coronavirus 2 NEGATIVE NEGATIVE Final    Comment: (NOTE) SARS-CoV-2 target nucleic acids are NOT DETECTED. The SARS-CoV-2 RNA is generally detectable in upper and lower respiratory specimens during the acute phase of infection. Negative results do not preclude SARS-CoV-2 infection, do not rule out co-infections with other pathogens, and should not be used as the sole basis for treatment or other patient management decisions. Negative results must be combined with clinical observations, patient history, and epidemiological information. The expected result is Negative. Fact Sheet for Patients: SugarRoll.be Fact Sheet for Healthcare Providers: https://www.woods-mathews.com/ This test is not yet approved or cleared by the [place] FDA and  has been authorized for detection and/or diagnosis of SARS-CoV-2 by FDA under an Emergency Use Authorization (EUA). This EUA will remain  in effect (meaning this test can be used) for the duration of the COVID-19  declaration under Section 56 4(b)(1) of the Act, 21 U.S.C. section 360bbb-3(b)(1), unless the authorization is terminated or revoked sooner. Performed at [facility], [place] [address] [place], [place] [address]      Labs: BNP (last 3 results) No results for input(s): BNP in the last 8760 hours. Basic Metabolic Panel: Recent Labs  Lab 09/04/19 1619 09/05/19 0538  NA 143 140  K 3.5 3.7  CL 112* 111  CO2 22 22  GLUCOSE 100* 92  BUN 69* 62*  CREATININE 2.01* 2.06*  CALCIUM 8.8* 8.2*   Liver Function Tests: Recent Labs  Lab 09/04/19 1619  AST 28  ALT 17  ALKPHOS 52  BILITOT 0.9  PROT 5.2*  ALBUMIN 2.5*   Recent Labs  Lab 09/04/19 1619  LIPASE 22   No results for  input(s): AMMONIA in the last 168 hours. CBC: Recent Labs  Lab 09/04/19 1619 09/05/19 0538 09/06/19 0519  WBC 5.9 4.8 4.4  NEUTROABS  --   --  2.7  HGB 8.9* 7.5* 7.3*  HCT 27.4* 22.5* 22.1*  MCV 93.2 92.2 92.5  PLT 182 144* 131*   Cardiac Enzymes: No results for input(s): CKTOTAL, CKMB, CKMBINDEX, TROPONINI in the last 168 hours. BNP: Invalid input(s): POCBNP CBG: No results for input(s): GLUCAP in the last 168 hours. D-Dimer No results for input(s): DDIMER in the last 72 hours. Hgb A1c No results for input(s): HGBA1C in the last 72 hours. Lipid Profile No results for input(s): CHOL, HDL, LDLCALC, TRIG, CHOLHDL, LDLDIRECT in the last 72 hours. Thyroid function studies No results for input(s): TSH, T4TOTAL, T3FREE, THYROIDAB in the last 72 hours.  Invalid input(s): FREET3 Anemia work up Recent Labs    09/05/19 0538  VITAMINB12 254  TIBC 240*  IRON 32   Urinalysis    Component Value Date/Time   COLORURINE YELLOW 09/04/2019 2329   APPEARANCEUR CLEAR 09/04/2019 2329   LABSPEC 1.010 09/04/2019 2329   PHURINE 5.0 09/04/2019 2329   GLUCOSEU NEGATIVE 09/04/2019 2329   HGBUR NEGATIVE 09/04/2019 2329   BILIRUBINUR NEGATIVE 09/04/2019 2329   BILIRUBINUR SMALL 03/18/2016 1054    KETONESUR NEGATIVE 09/04/2019 2329   PROTEINUR 100 (A) 09/04/2019 2329   UROBILINOGEN 0.2 03/18/2016 1054   UROBILINOGEN 0.2 06/25/2015 0920   NITRITE NEGATIVE 09/04/2019 2329   LEUKOCYTESUR TRACE (A) 09/04/2019 2329   Sepsis Labs Invalid input(s): PROCALCITONIN,  WBC,  LACTICIDVEN Microbiology Recent Results (from the past 240 hour(s))  SARS CORONAVIRUS 2 (TAT 6-24 HRS) Nasopharyngeal Nasopharyngeal Swab     Status: None   Collection Time: 09/04/19 10:38 PM   Specimen: Nasopharyngeal Swab  Result Value Ref Range Status   SARS Coronavirus 2 NEGATIVE NEGATIVE Final    Comment: (NOTE) SARS-CoV-2 target nucleic acids are NOT DETECTED. The SARS-CoV-2 RNA is generally detectable in upper and lower respiratory specimens during the acute phase of infection. Negative results do not preclude SARS-CoV-2 infection, do not rule out co-infections with other pathogens, and should not be used as the sole basis for treatment or other patient management decisions. Negative results must be combined with clinical observations, patient history, and epidemiological information. The expected result is Negative. Fact Sheet for Patients: SugarRoll.be Fact Sheet for Healthcare Providers: https://www.woods-mathews.com/ This test is not yet approved or cleared by the [place] FDA and  has been authorized for detection and/or diagnosis of SARS-CoV-2 by FDA under an Emergency Use Authorization (EUA). This EUA will remain  in effect (meaning this test can be used) for the duration of the COVID-19 declaration under Section 56 4(b)(1) of the Act, 21 U.S.C. section 360bbb-3(b)(1), unless the authorization is terminated or revoked sooner. Performed at [facility], [place] [address], [place], [place] [address]     Please note: You were cared for by a hospitalist during your hospital stay. Once you are discharged, your primary care physician will handle any  further medical issues. Please note that NO REFILLS for any discharge medications will be authorized once you are discharged, as it is imperative that you return to your primary care physician (or establish a relationship with a primary care physician if you do not have one) for your post hospital discharge needs so that they can reassess your need for medications and monitor your lab values.    Time coordinating discharge: 40 minutes  SIGNED:  Shelly Coss, MD  [facility] 09/06/2019, 11:39 AM Pager [phone]  If 7PM-7AM, please contact night-coverage [email or web address] Password [email or web address]

## 2019-09-06 NOTE — Progress Notes (Signed)
Endoscopic findings from yesterday noted: Multiple gastric ulcers, which on review of photographs, appear benign to me.  Pathology pending.  Careful questioning continues to come up with NO history of exposure to ulcerogenic medications, even though the endoscopic appearance looks very characteristic of that (?accuracy of history).  Patient feels well.   Sitting up on side of bed, talking on phone, NAD.  The patient remains severely anemic but hemoglobin is essentially stable overnight, going from 7.5 to 7.3.  Recommendations:  1.  Have added sucralfate to the patient's PPI regimen.  I think this will only be needed while she is in-house, as adjunctive therapy to "patch" her ulcers.  On discharge, PPI therapy by itself should be sufficient.   Would recommend pantoprazole or omeprazole 40mg  bid until seen back in our office.  [place] today reasonable since ulcers were clean-based and therefore patient is at low risk of re-bleeding.  2.  Would keep patient off anticoagulation for approximately 3-5 more days to help confirm that no further ulcer bleeding will occur.  Thereafter, it could be restarted if deemed medically important.  I think the patient would be at low risk for rebleeding as long as she remains on antipeptic therapy and avoids any ulcerogenic medications.  3.  Await pathology on biopsies.  Although the ulcers look benign, particularly the one at the incisura should be followed up endoscopically in about 2 months to confirm healing.  We will take care of arranging that.  4.  Would recommend that patient f/u w/ her PCP in 1-2 weeks to recheck hgb and hemoccult status.  5.   Will sign off.  Please call us if questions.  Cleotis Nipper, M.D. Pager [phone] If no answer or after 5 PM call [phone]

## 2019-09-06 NOTE — Progress Notes (Signed)
During the night pt has had a low HR in the 40-59 range dropping down once to 39 for a few seconds while pt was asleep.

## 2019-09-07 ENCOUNTER — Ambulatory Visit: Payer: Self-pay | Admitting: Licensed Clinical Social Worker

## 2019-09-07 ENCOUNTER — Other Ambulatory Visit: Payer: Self-pay

## 2019-09-07 ENCOUNTER — Encounter: Payer: Self-pay | Admitting: *Deleted

## 2019-09-07 ENCOUNTER — Telehealth: Payer: Medicare HMO

## 2019-09-07 NOTE — Chronic Care Management (AMB) (Signed)
   Social Work  Care Management Consultation  09/07/2019 Name: Jennifer Foster MRN: [id number] DOB: 1946-04-27  Jennifer Foster is a 74 y.o. year old female who sees [name], MD for primary care. LCSW was scheduled for phone F/U check in with patient today. Patient was not contacted during this encounter however LCSW reviewed chart, notes, insurance.  Noticed patient was discharged from the hospital yesterday and collaboration with RN CCM Care Manager.     Recommendation: After consultation with RN Care Manager it is determined that patient may benefit from F/U phone call from RN care manager verses CCM social work.    Plan:  1. Phone appointment scheduled with RN care manager 09/08/18 2. LCSW will consult with RN prior to reaching out to patient.  Casimer Lanius, LCSW Clinical Social Worker [place] [facility] [place]   [phone] 11:30 AM

## 2019-09-08 ENCOUNTER — Other Ambulatory Visit: Payer: Self-pay | Admitting: Internal Medicine

## 2019-09-08 NOTE — Telephone Encounter (Signed)
Refill Request.  

## 2019-09-09 ENCOUNTER — Ambulatory Visit: Payer: Medicare HMO

## 2019-09-09 ENCOUNTER — Other Ambulatory Visit: Payer: Self-pay

## 2019-09-09 NOTE — Patient Instructions (Addendum)
Visit Information Goals Addressed            This Visit's Progress   . " I am all confused about the discharge papers" (pt-stated)       Current Barriers:  [name] Knowledge Deficits related to to post discharge instructions following IP event on 09/04/19 to 09/06/19 . Chronic Disease Management support and education needs related to ost discharge instructions ,including  medication review, MD follow up review.  Nurse Case Manager Clinical Goal(s):  [name] Over the next 14 days, patient will verbalize understanding of plan for post discharge instructions  Interventions:  . Evaluation of current treatment plan related to post discharge instructions following 09/04/19 to 09/06/19 and patient's adherence to plan as established by provider. . Patient states that she is doing ok but has a headache today and a little dizziness.  Advised the patient that if it persist to call the office after hours. She verbalized understanding. Any problems struggling to breath, Chest pain that will not go away new confusion or can't think clearly, fainting  call  911.  She verbalized understanding. [name] over the Heart failure action plan.  The patient does not have a scale but is going to [place] and states that she can buy one and will write the values down.  . Reviewed medications she did not have her Protonix 40 mg or Carafate 1G /10 ml she is going to pick it up at [facility] today.   Had not been taking Potassium because she said someone had told her to stop taking it.,needs to get multi vitamin.  Took one of her Eliquis 5mg  today because she did not read the discharge papers. Will start on 09/11/19 as on d/c papers  Will Send Note To [name] Pharm D to speak with the patient for medication management. Patient agreed. . Discussed plans for ongoing care management follow up  . Reviewed scheduled appointment with Dr. [name] 10/04/19 @ 930 am. Patient verbalized understanding.  Patient Self Care Activities:  . Performs ADL's  independently . Performs IADL's independently . Calls provider office for new concerns or questions . Unable to independently understand post discharge instructions following IP event on 09/04/19 to 09/06/19  Initial goal documentation          Ms. Maclaren was given information about Care Management services today including:  1. Care Management services include personalized support from designated clinical staff supervised by her physician, including individualized plan of care and coordination with other care providers 2. 24/7 contact phone numbers for assistance for urgent and routine care needs. 3. The patient may stop CCM services at any time (effective at the end of the month) by phone call to the office staff.  Patient agreed to services and verbal consent obtained.   The patient verbalized understanding of instructions provided today and declined a print copy of patient instruction materials.   The care management team will reach out to the patient again over the next 14 days.   Lazaro Arms RN, BSN, [facility] Care Management Coordinator [place] Phone: [phone] Fax: [phone]

## 2019-09-09 NOTE — Chronic Care Management (AMB) (Signed)
Care Management   Initial Visit Note  09/09/2019 Name: Jennifer Foster MRN: [id number] DOB: Nov 23, 1945  Subjective:   Objective:  Assessment: Jennifer Foster is a 74 y.o. year old female who sees [name], MD for primary care. The care management team was consulted for assistance with care management and care coordination needs related to Disease Management Educational Needs  CHF/ CKD Stage 3.   Review of patient status, including review of consultants reports, relevant laboratory and other test results, and collaboration with appropriate care team members and the patient's provider was performed as part of comprehensive patient evaluation and provision of care management services.    SDOH (Social Determinants of Health) screening performed today: None. See Care Plan for related entries.    Outpatient Encounter Medications as of 09/09/2019  Medication Sig Note  . amLODipine (NORVASC) 5 MG tablet Take 1 tablet (5 mg total) by mouth 2 (two) times daily.   [name] apixaban (ELIQUIS) 5 MG TABS tablet Take 1 tablet (5 mg total) by mouth 2 (two) times daily. Please start taking only on 09/11/19 09/09/2019: Took 1 pill today 09/09/19 had not read the discharge instructions to start on the 09/11/19  . calcitRIOL (ROCALTROL) 0.25 MCG capsule Take 0.25 mcg by mouth daily.    [name] Cod Liver Oil 1000 MG CAPS Take 1,000 mg by mouth daily.    [name] escitalopram (LEXAPRO) 10 MG tablet TAKE 1 TABLET(10 MG) BY MOUTH DAILY (Patient taking differently: Take 10 mg by mouth daily. TAKE 1 TABLET(10 MG) BY MOUTH DAILY)   . furosemide (LASIX) 40 MG tablet TAKE 1/2 TABLET(20 MG) BY MOUTH DAILY   . ipratropium-albuterol (DUONEB) 0.5-2.5 (3) MG/3ML SOLN Take 3 mLs by nebulization every 4 (four) hours as needed. (Patient taking differently: Take 3 mLs by nebulization every 4 (four) hours as needed (wheezing/shortness of breath). )   . irbesartan (AVAPRO) 150 MG tablet TAKE 1 TABLET BY MOUTH EVERY DAY (Patient taking differently:  Take 150 mg by mouth daily. )   . isosorbide mononitrate (IMDUR) 30 MG 24 hr tablet Take 1 tablet (30 mg total) by mouth daily.   . metoprolol succinate (TOPROL-XL) 25 MG 24 hr tablet TAKE 1 TABLET BY MOUTH EVERY DAY(KEEP UPCOMING APPT IN MAY FOR FUTURE REFILLS) (Patient taking differently: Take 25 mg by mouth daily. )   . nitroGLYCERIN (NITROSTAT) 0.4 MG SL tablet Place 1 tablet (0.4 mg total) under the tongue every 5 (five) minutes as needed for chest pain. If do not resolve after 1 tablet, go to ED   . rosuvastatin (CRESTOR) 20 MG tablet TAKE 1 TABLET BY MOUTH AT BEDTIME   . albuterol (PROVENTIL HFA;VENTOLIN HFA) 108 (90 Base) MCG/ACT inhaler Inhale 2 puffs into the lungs every 6 (six) hours as needed for wheezing or shortness of breath. (Patient not taking: Reported on 09/09/2019) 09/09/2019: Needs refill  . Baclofen 5 MG TABS Take 1 tablet by mouth every 12 (twelve) hours as needed. (Patient not taking: Reported on 05/31/2019)   . Multiple Vitamin (MULTIVITAMIN WITH MINERALS) TABS tablet Take 1 tablet by mouth daily. 09/09/2019: Needs to get sum at the pharmacy  . pantoprazole (PROTONIX) 40 MG tablet Take 1 tablet (40 mg total) by mouth 2 (two) times daily before a meal. (Patient not taking: Reported on 09/09/2019) 09/09/2019: Has not picked up yet  . potassium chloride SA (KLOR-CON) 20 MEQ tablet Take 0.5 tablets (10 mEq total) by mouth daily. (Patient not taking: Reported on 09/09/2019) 09/09/2019: Patient has not  been taking potassium because she states they told her to stop  . sucralfate (CARAFATE) 1 GM/10ML suspension Take 10 mLs (1 g total) by mouth 4 (four) times daily -  with meals and at bedtime. (Patient not taking: Reported on 09/09/2019) 09/09/2019: Has not pick it up yet   No facility-administered encounter medications on file as of 09/09/2019.   Goals Addressed            This Visit's Progress   . " I am all confused about the discharge papers" (pt-stated)       Current Barriers:  [name] Knowledge  Deficits related to to post discharge instructions following IP event on 09/04/19 to 09/06/19 . Chronic Disease Management support and education needs related to ost discharge instructions ,including  medication review, MD follow up review.  Nurse Case Manager Clinical Goal(s):  [name] Over the next 14 days, patient will verbalize understanding of plan for post discharge instructions  Interventions:  . Evaluation of current treatment plan related to post discharge instructions following 09/04/19 to 09/06/19 and patient's adherence to plan as established by provider. . Patient states that she is doing ok but has a headache today and a little dizziness.  Advised the patient that if it persist to call the office after hours. She verbalized understanding. Any problems struggling to breath, Chest pain that will not go away new confusion or can't think clearly, fainting  call  911.  She verbalized understanding. Jennifer Foster over the Heart failure action plan.  The patient does not have a scale but is going to [place] and states that she can buy one and will write the values down.  . Reviewed medications she did not have her Protonix 40 mg or Carafate 1G /10 ml she is going to pick it up at [facility] today.   Had not been taking Potassium because she said someone had told her to stop taking it.,needs to get multi vitamin.  Took one of her Eliquis 5mg  today because she did not read the discharge papers. Will start on 09/11/19 as on d/c papers  Will Send Note To [name] Pharm D to speak with the patient for medication management. Patient agreed. . Discussed plans for ongoing care management follow up  . Reviewed scheduled appointment with Dr. [name] 10/04/19 @ 930 am. Patient verbalized understanding.  Patient Self Care Activities:  . Performs ADL's independently . Performs IADL's independently . Calls provider office for new concerns or questions . Unable to independently understand post discharge instructions following  IP event on 09/04/19 to 09/06/19  Initial goal documentation           Follow up plan:  The care management team will reach out to the patient again over the next 14 days.     Ms. Galvan was given information about Care Management services today including:  1. Care Management services include personalized support from designated clinical staff supervised by a physician, including individualized plan of care and coordination with other care providers 2. 24/7 contact phone numbers for assistance for urgent and routine care needs. 3. The patient may stop Care Management services at any time (effective at the end of the month) by phone call to the office staff.  Patient agreed to services and verbal consent obtained.  Lazaro Arms RN, BSN, [facility] Care Management Coordinator [place] Phone: [phone] Fax: [phone]

## 2019-09-20 ENCOUNTER — Ambulatory Visit: Payer: Medicare HMO | Admitting: Pharmacist

## 2019-09-20 ENCOUNTER — Other Ambulatory Visit: Payer: Self-pay

## 2019-09-20 ENCOUNTER — Ambulatory Visit: Payer: Medicare HMO

## 2019-09-20 NOTE — Chronic Care Management (AMB) (Signed)
Care Management   Follow Up Note   09/20/2019 Name: Jennifer Foster MRN: [id number] DOB: 05/30/1946  Referred by: Jennifer Feil, MD Reason for referral : Care Coordination (Care Management RNCM CHF/ CKD Stage 3)   Jennifer Foster is a 74 y.o. year old female who is a primary care patient of Jennifer Feil, MD. The care management team was consulted for assistance with care management and care coordination needs.    Review of patient status, including review of consultants reports, relevant laboratory and other test results, and collaboration with appropriate care team members and the patient's provider was performed as part of comprehensive patient evaluation and provision of chronic care management services.    SDOH (Social Determinants of Health) screening performed today: None. See Care Plan for related entries.   Advanced Directives: See Care Plan and Vynca application for related entries.   Goals Addressed            This Visit's Progress   . " I am all confused about the discharge papers" (pt-stated)       Current Barriers:  [name] Knowledge Deficits related to to post discharge instructions following IP event on 09/04/19 to 09/06/19 . Chronic Disease Management support and education needs related to ost discharge instructions ,including  medication review, MD follow up review.  Nurse Case Manager Clinical Goal(s):  [name] Over the next 14 days, patient will verbalize understanding of plan for post discharge instructions  Interventions:  . Evaluation of current treatment plan related to post discharge instructions following 09/04/19 to 09/06/19 and patient's adherence to plan as established by provider. . Patient states that she is doing ok but has a headache today and a little dizziness.  Advised the patient that if it persist to call the office after hours. She verbalized understanding. Any problems struggling to breath, Chest pain that will not go away new confusion or can't think  clearly, fainting  call  911.  She verbalized understanding. [name] over the Heart failure action plan.  The patient does not have a scale but is going to [place] and states that she can buy one and will write the values down.  . Reviewed medications she did not have her Protonix 40 mg or Carafate 1G /10 ml she is going to pick it up at [facility] today.   Had not been taking Potassium because she said someone had told her to stop taking it.,needs to get multi vitamin.  Took one of her Eliquis 5mg  today because she did not read the discharge papers. Will start on 09/11/19 as on d/c papers  Will Send Note To Jennifer Foster Pharm D to speak with the patient for medication management. Patient agreed. . Discussed plans for ongoing care management follow up  . Reviewed scheduled appointment with Dr. [name] 10/04/19 @ 930 am. Patient verbalized understanding.  . 09/20/19 . Patient states that she is feeling much better and no more HA . Patient states that she is still having intermittent dizziness, it happens when she is in a lying or sitting position and rises to fast.  Advised the patient when she sits up or stands to remain still for a moment to gain her composure before starting to move, she verbalized understanding. . Patient states she is taking her medications as prescribed.  She needs to go get her albuterol inhaler filled. . The patient states that she is not eating any fried foods and monitoring the salt in her diet. . Reminded the  patient of her appointment in February with Dr [name] . She is not weighing because she does not have a scale.  Have a scale at the office for her.  Tried to call her back but no answer.  Patient Self Care Activities:  . Performs ADL's independently . Performs IADL's independently . Calls provider office for new concerns or questions . Unable to independently understand post discharge instructions following IP event on 09/04/19 to 09/06/19  Initial goal documentation           The care management team will reach out to the patient again over the next 14 days.  The patient has been provided with contact information for the care management team and has been advised to call with any health related questions or concerns.   Lazaro Arms RN, BSN, [facility] Care Management Coordinator [place] Phone: [phone] Fax: [phone]

## 2019-09-20 NOTE — Progress Notes (Signed)
  Chronic Care Management   Outreach Note  09/20/2019 Name: Jennifer Foster MRN: [id number] DOB: May 01, 1946  Referred by: [name], MD Reason for referral : Chronic Care Management   An unsuccessful telephone outreach was attempted today. The patient was referred to the case management team by for assistance with care management and care coordination.   Follow Up Plan: The care management team will reach out to the patient again over the next 5-7 days.   SIGNATURE Regina Eck, PharmD, BCPS Clinical Pharmacist, [place]: [phone]

## 2019-09-20 NOTE — Patient Instructions (Signed)
Visit Information  Goals Addressed            This Visit's Progress   . " I am all confused about the discharge papers" (pt-stated)       Current Barriers:  [name] Knowledge Deficits related to to post discharge instructions following IP event on 09/04/19 to 09/06/19 . Chronic Disease Management support and education needs related to ost discharge instructions ,including  medication review, MD follow up review.  Nurse Case Manager Clinical Goal(s):  [name] Over the next 14 days, patient will verbalize understanding of plan for post discharge instructions  Interventions:  . Evaluation of current treatment plan related to post discharge instructions following 09/04/19 to 09/06/19 and patient's adherence to plan as established by provider. . Patient states that she is doing ok but has a headache today and a little dizziness.  Advised the patient that if it persist to call the office after hours. She verbalized understanding. Any problems struggling to breath, Chest pain that will not go away new confusion or can't think clearly, fainting  call  911.  She verbalized understanding. [name] over the Heart failure action plan.  The patient does not have a scale but is going to [place] and states that she can buy one and will write the values down.  . Reviewed medications she did not have her Protonix 40 mg or Carafate 1G /10 ml she is going to pick it up at [facility] today.   Had not been taking Potassium because she said someone had told her to stop taking it.,needs to get multi vitamin.  Took one of her Eliquis 5mg  today because she did not read the discharge papers. Will start on 09/11/19 as on d/c papers  Will Send Note To [name] Pharm D to speak with the patient for medication management. Patient agreed. . Discussed plans for ongoing care management follow up  . Reviewed scheduled appointment with Dr. [name] 10/04/19 @ 930 am. Patient verbalized understanding.  . 09/20/19 . Patient states that she is feeling  much better and no more HA . Patient states that she is still having intermittent dizziness, it happens when she is in a lying or sitting position and rises to fast.  Advised the patient when she sits up or stands to remain still for a moment to gain her composure before starting to move, she verbalized understanding. . Patient states she is taking her medications as prescribed.  She needs to go get her albuterol inhaler filled. . The patient states that she is not eating any fried foods and monitoring the salt in her diet. . Reminded the patient of her appointment in February with Dr [name] . She is not weighing because she does not have a scale.  Have a scale at the office for her.  Tried to call her back but no answer.  Patient Self Care Activities:  . Performs ADL's independently . Performs IADL's independently . Calls provider office for new concerns or questions . Unable to independently understand post discharge instructions following IP event on 09/04/19 to 09/06/19  Initial goal documentation         Ms. Kramme was given information about Care Management services today including:  1. Care Management services include personalized support from designated clinical staff supervised by her physician, including individualized plan of care and coordination with other care providers 2. 24/7 contact phone numbers for assistance for urgent and routine care needs. 3. The patient may stop CCM services at any time (  effective at the end of the month) by phone call to the office staff.  Patient agreed to services and verbal consent obtained.   The patient verbalized understanding of instructions provided today and declined a print copy of patient instruction materials.   The care management team will reach out to the patient again over the next 14 days.  The patient has been provided with contact information for the care management team and has been advised to call with any health related  questions or concerns.   Lazaro Arms RN, BSN, [facility] Care Management Coordinator [place] Phone: [phone] Fax: [phone]

## 2019-09-23 ENCOUNTER — Other Ambulatory Visit: Payer: Self-pay | Admitting: *Deleted

## 2019-09-23 MED ORDER — BACLOFEN 5 MG PO TABS: 1.0000 | ORAL_TABLET | Freq: Two times a day (BID) | ORAL | 0 refills | Status: DC | PRN

## 2019-09-27 ENCOUNTER — Other Ambulatory Visit: Payer: Self-pay

## 2019-09-27 ENCOUNTER — Telehealth: Payer: Medicare HMO

## 2019-09-27 DIAGNOSIS — N2581 Secondary hyperparathyroidism of renal origin: Secondary | ICD-10-CM | POA: Diagnosis not present

## 2019-09-27 DIAGNOSIS — I129 Hypertensive chronic kidney disease with stage 1 through stage 4 chronic kidney disease, or unspecified chronic kidney disease: Secondary | ICD-10-CM | POA: Diagnosis not present

## 2019-09-27 DIAGNOSIS — D631 Anemia in chronic kidney disease: Secondary | ICD-10-CM | POA: Diagnosis not present

## 2019-09-27 DIAGNOSIS — N184 Chronic kidney disease, stage 4 (severe): Secondary | ICD-10-CM | POA: Diagnosis not present

## 2019-09-27 DIAGNOSIS — N189 Chronic kidney disease, unspecified: Secondary | ICD-10-CM | POA: Diagnosis not present

## 2019-10-03 ENCOUNTER — Encounter: Payer: Self-pay | Admitting: Family Medicine

## 2019-10-03 DIAGNOSIS — F1411 Cocaine abuse, in remission: Secondary | ICD-10-CM | POA: Insufficient documentation

## 2019-10-04 ENCOUNTER — Ambulatory Visit: Payer: Medicare HMO | Admitting: Family Medicine

## 2019-10-04 ENCOUNTER — Telehealth: Payer: Self-pay | Admitting: Family Medicine

## 2019-10-04 NOTE — Telephone Encounter (Signed)
Patient missed her appointment today. I called to check on her. However, there was no response. Will f/u.

## 2019-10-10 ENCOUNTER — Ambulatory Visit: Payer: Medicare HMO

## 2019-10-10 ENCOUNTER — Other Ambulatory Visit: Payer: Self-pay

## 2019-10-10 NOTE — Chronic Care Management (AMB) (Signed)
  Care Management   Outreach Note  10/10/2019 Name: Jennifer Foster MRN: [id number] DOB: October 04, 1945  Referred by: [name], MD Reason for referral : Care Coordination (Care Management RNCM CHF/CKD Stage 3)   An unsuccessful telephone outreach was attempted today. The patient was referred to the case management team for assistance with care management and care coordination.   Follow Up Plan: The care management team will reach out to the patient again over the next 5-7 days.   Lazaro Arms RN, BSN, [facility] Care Management Coordinator [place] Phone: [phone] Fax: [phone]

## 2019-10-17 ENCOUNTER — Other Ambulatory Visit: Payer: Self-pay

## 2019-10-17 ENCOUNTER — Ambulatory Visit: Payer: Medicare HMO

## 2019-10-17 ENCOUNTER — Telehealth: Payer: Self-pay | Admitting: Physician Assistant

## 2019-10-17 ENCOUNTER — Other Ambulatory Visit (HOSPITAL_COMMUNITY): Payer: Self-pay | Admitting: *Deleted

## 2019-10-17 NOTE — Telephone Encounter (Signed)
Pt is requesting a refill on Albuterol. Would Dr. Acie Fredrickson like to refill this medication? Please address

## 2019-10-17 NOTE — Telephone Encounter (Signed)
Dr. Acie Fredrickson has never seen this patient. Request for refill of Albuterol should go to her PCP.

## 2019-10-17 NOTE — Telephone Encounter (Signed)
New Message    *STAT* If patient is at the pharmacy, call can be transferred to refill team.   1. Which medications need to be refilled? (please list name of each medication and dose if known) albuterol (PROVENTIL HFA;VENTOLIN HFA) 108 (90 Base) MCG/ACT inhaler  2. Which pharmacy/location (including street and city if local pharmacy) is medication to be sent to? [facility] [phone] - [place], [place] - [address] AT [address] [place]  3. Do they need a 30 day or 90 day supply?    Pt needs it today

## 2019-10-17 NOTE — Telephone Encounter (Signed)
Called pt back but there was no answer, could not leave a message for pt.

## 2019-10-17 NOTE — Chronic Care Management (AMB) (Signed)
  Care Management   Outreach Note  10/17/2019 Name: Jennifer Foster MRN: [id number] DOB: August 27, 1946  Referred by: [name], MD Reason for referral : Care Coordination (Care Management RNCM CHF CKD 3 2nd attempt)   A second unsuccessful telephone outreach was attempted today. The patient was referred to the case management team for assistance with care management and care coordination.   Follow Up Plan: Called and the phone rang several times.  No voicemail pick. The care management team will reach out to the patient again over the next 5-7 days.   Lazaro Arms RN, BSN, [facility] Care Management Coordinator [place] Phone: [phone] Fax: [phone]

## 2019-10-17 NOTE — Discharge Instructions (Signed)
Ferumoxytol injection What is this medicine? FERUMOXYTOL is an iron complex. Iron is used to make healthy red blood cells, which carry oxygen and nutrients throughout the body. This medicine is used to treat iron deficiency anemia. This medicine may be used for other purposes; ask your health care provider or pharmacist if you have questions. COMMON BRAND NAME(S): Feraheme What should I tell my health care provider before I take this medicine? They need to know if you have any of these conditions:  anemia not caused by low iron levels  high levels of iron in the blood  magnetic resonance imaging (MRI) test scheduled  an unusual or allergic reaction to iron, other medicines, foods, dyes, or preservatives  pregnant or trying to get pregnant  breast-feeding How should I use this medicine? This medicine is for injection into a vein. It is given by a health care professional in a hospital or clinic setting. Talk to your pediatrician regarding the use of this medicine in children. Special care may be needed. Overdosage: If you think you have taken too much of this medicine contact a poison control center or emergency room at once. NOTE: This medicine is only for you. Do not share this medicine with others. What if I miss a dose? It is important not to miss your dose. Call your doctor or health care professional if you are unable to keep an appointment. What may interact with this medicine? This medicine may interact with the following medications:  other iron products This list may not describe all possible interactions. Give your health care provider a list of all the medicines, herbs, non-prescription drugs, or dietary supplements you use. Also tell them if you smoke, drink alcohol, or use illegal drugs. Some items may interact with your medicine. What should I watch for while using this medicine? Visit your doctor or healthcare professional regularly. Tell your doctor or healthcare  professional if your symptoms do not start to get better or if they get worse. You may need blood work done while you are taking this medicine. You may need to follow a special diet. Talk to your doctor. Foods that contain iron include: whole grains/cereals, dried fruits, beans, or peas, leafy green vegetables, and organ meats (liver, kidney). What side effects may I notice from receiving this medicine? Side effects that you should report to your doctor or health care professional as soon as possible:  allergic reactions like skin rash, itching or hives, swelling of the face, lips, or tongue  breathing problems  changes in blood pressure  feeling faint or lightheaded, falls  fever or chills  flushing, sweating, or hot feelings  swelling of the ankles or feet Side effects that usually do not require medical attention (report to your doctor or health care professional if they continue or are bothersome):  diarrhea  headache  nausea, vomiting  stomach pain This list may not describe all possible side effects. Call your doctor for medical advice about side effects. You may report side effects to FDA at 1-800-FDA-1088. Where should I keep my medicine? This drug is given in a hospital or clinic and will not be stored at home. NOTE: This sheet is a summary. It may not cover all possible information. If you have questions about this medicine, talk to your doctor, pharmacist, or health care provider.  2020 Elsevier/Gold Standard (2016-10-06 20:21:10) Epoetin Alfa injection What is this medicine? EPOETIN ALFA (e POE e tin AL fa) helps your body make more red blood cells. This  medicine is used to treat anemia caused by chronic kidney disease, cancer chemotherapy, or HIV-therapy. It may also be used before surgery if you have anemia. This medicine may be used for other purposes; ask your health care provider or pharmacist if you have questions. COMMON BRAND NAME(S): Epogen, Procrit,  Retacrit What should I tell my health care provider before I take this medicine? They need to know if you have any of these conditions:  cancer  heart disease  high blood pressure  history of blood clots  history of stroke  low levels of folate, iron, or vitamin B12 in the blood  seizures  an unusual or allergic reaction to erythropoietin, albumin, benzyl alcohol, hamster proteins, other medicines, foods, dyes, or preservatives  pregnant or trying to get pregnant  breast-feeding How should I use this medicine? This medicine is for injection into a vein or under the skin. It is usually given by a health care professional in a hospital or clinic setting. If you get this medicine at home, you will be taught how to prepare and give this medicine. Use exactly as directed. Take your medicine at regular intervals. Do not take your medicine more often than directed. It is important that you put your used needles and syringes in a special sharps container. Do not put them in a trash can. If you do not have a sharps container, call your pharmacist or healthcare provider to get one. A special MedGuide will be given to you by the pharmacist with each prescription and refill. Be sure to read this information carefully each time. Talk to your pediatrician regarding the use of this medicine in children. While this drug may be prescribed for selected conditions, precautions do apply. Overdosage: If you think you have taken too much of this medicine contact a poison control center or emergency room at once. NOTE: This medicine is only for you. Do not share this medicine with others. What if I miss a dose? If you miss a dose, take it as soon as you can. If it is almost time for your next dose, take only that dose. Do not take double or extra doses. What may interact with this medicine? Interactions have not been studied. This list may not describe all possible interactions. Give your health care  provider a list of all the medicines, herbs, non-prescription drugs, or dietary supplements you use. Also tell them if you smoke, drink alcohol, or use illegal drugs. Some items may interact with your medicine. What should I watch for while using this medicine? Your condition will be monitored carefully while you are receiving this medicine. You may need blood work done while you are taking this medicine. This medicine may cause a decrease in vitamin B6. You should make sure that you get enough vitamin B6 while you are taking this medicine. Discuss the foods you eat and the vitamins you take with your health care professional. What side effects may I notice from receiving this medicine? Side effects that you should report to your doctor or health care professional as soon as possible:  allergic reactions like skin rash, itching or hives, swelling of the face, lips, or tongue  seizures  signs and symptoms of a blood clot such as breathing problems; changes in vision; chest pain; severe, sudden headache; pain, swelling, warmth in the leg; trouble speaking; sudden numbness or weakness of the face, arm or leg  signs and symptoms of a stroke like changes in vision; confusion; trouble speaking or understanding;   severe headaches; sudden numbness or weakness of the face, arm or leg; trouble walking; dizziness; loss of balance or coordination Side effects that usually do not require medical attention (report to your doctor or health care professional if they continue or are bothersome):  chills  cough  dizziness  fever  headaches  joint pain  muscle cramps  muscle pain  nausea, vomiting  pain, redness, or irritation at site where injected This list may not describe all possible side effects. Call your doctor for medical advice about side effects. You may report side effects to FDA at 1-800-FDA-1088. Where should I keep my medicine? Keep out of the reach of children. Store in a  refrigerator between 2 and 8 degrees C (36 and 46 degrees F). Do not freeze or shake. Throw away any unused portion if using a single-dose vial. Multi-dose vials can be kept in the refrigerator for up to 21 days after the initial dose. Throw away unused medicine. NOTE: This sheet is a summary. It may not cover all possible information. If you have questions about this medicine, talk to your doctor, pharmacist, or health care provider.  2020 Elsevier/Gold Standard (2017-03-27 08:35:19)

## 2019-10-18 ENCOUNTER — Other Ambulatory Visit: Payer: Self-pay

## 2019-10-18 ENCOUNTER — Encounter (HOSPITAL_COMMUNITY)
Admission: RE | Admit: 2019-10-18 | Discharge: 2019-10-18 | Disposition: A | Discharge status: Home / Self Care | Payer: Medicare HMO | Source: Ambulatory Visit | Attending: Nephrology | Admitting: Nephrology

## 2019-10-18 VITALS — BP 171/64 | HR 54 | Temp 95.9°F | Resp 20 | Ht 64.0 in | Wt 141.0 lb

## 2019-10-18 DIAGNOSIS — D631 Anemia in chronic kidney disease: Secondary | ICD-10-CM | POA: Diagnosis not present

## 2019-10-18 DIAGNOSIS — N184 Chronic kidney disease, stage 4 (severe): Secondary | ICD-10-CM | POA: Insufficient documentation

## 2019-10-18 DIAGNOSIS — D649 Anemia, unspecified: Secondary | ICD-10-CM

## 2019-10-18 LAB — POCT HEMOGLOBIN-HEMACUE: Hemoglobin: 8.9 g/dL — ABNORMAL LOW (ref 12.0–15.0)

## 2019-10-18 MED ORDER — EPOETIN ALFA-EPBX 10000 UNIT/ML IJ SOLN: 10000.0000 [IU] | INTRAMUSCULAR | Status: DC

## 2019-10-18 MED ORDER — SODIUM CHLORIDE 0.9 % IV SOLN
510.0000 mg | INTRAVENOUS | Status: DC
  Administered 2019-10-18: 10:00:00 510 mg via INTRAVENOUS
  Filled 2019-10-18: qty 17

## 2019-10-18 MED ORDER — EPOETIN ALFA-EPBX 10000 UNIT/ML IJ SOLN
INTRAMUSCULAR | Status: AC
  Administered 2019-10-18: 10000 [IU]
  Filled 2019-10-18: qty 1

## 2019-10-25 ENCOUNTER — Inpatient Hospital Stay (HOSPITAL_COMMUNITY): Admission: RE | Admit: 2019-10-25 | Payer: Medicare HMO | Source: Ambulatory Visit

## 2019-10-26 ENCOUNTER — Ambulatory Visit: Payer: Medicare HMO

## 2019-10-26 ENCOUNTER — Other Ambulatory Visit: Payer: Self-pay

## 2019-10-31 DIAGNOSIS — Z1159 Encounter for screening for other viral diseases: Secondary | ICD-10-CM | POA: Diagnosis not present

## 2019-11-01 ENCOUNTER — Encounter (HOSPITAL_COMMUNITY): Payer: Medicare HMO

## 2019-11-02 DIAGNOSIS — K250 Acute gastric ulcer with hemorrhage: Secondary | ICD-10-CM | POA: Diagnosis not present

## 2019-11-02 DIAGNOSIS — K2930 Chronic superficial gastritis without bleeding: Secondary | ICD-10-CM | POA: Diagnosis not present

## 2019-11-02 DIAGNOSIS — K259 Gastric ulcer, unspecified as acute or chronic, without hemorrhage or perforation: Secondary | ICD-10-CM | POA: Diagnosis not present

## 2019-11-02 DIAGNOSIS — B9681 Helicobacter pylori [H. pylori] as the cause of diseases classified elsewhere: Secondary | ICD-10-CM | POA: Diagnosis not present

## 2019-11-04 DIAGNOSIS — K2930 Chronic superficial gastritis without bleeding: Secondary | ICD-10-CM | POA: Diagnosis not present

## 2019-11-04 DIAGNOSIS — B9681 Helicobacter pylori [H. pylori] as the cause of diseases classified elsewhere: Secondary | ICD-10-CM | POA: Diagnosis not present

## 2019-11-06 NOTE — Chronic Care Management (AMB) (Signed)
Care Management   Follow Up Note   11/06/2019 Name: Jennifer Foster MRN: [id number] DOB: December 06, 1945  Referred by: [name], MD Reason for referral : Care Coordination (Care Management CHF/CKD Stage 3  3rd attempt)   Jennifer Foster is a 74 y.o. year old female who is a primary care patient of [name], MD. The care management team was consulted for assistance with care management and care coordination needs.    Review of patient status, including review of consultants reports, relevant laboratory and other test results, and collaboration with appropriate care team members and the patient's provider was performed as part of comprehensive patient evaluation and provision of chronic care management services.    SDOH (Social Determinants of Health) assessments performed: No See Care Plan activities for detailed interventions related to [facility])     Advanced Directives: See Care Plan and Vynca application for related entries.  Goals Addressed            This Visit's Progress   . " I am all confused about the discharge papers" (pt-stated)       Current Barriers:  [name] Knowledge Deficits related to to post discharge instructions following IP event on 09/04/19 to 09/06/19 . Chronic Disease Management support and education needs related to ost discharge instructions ,including  medication review, MD follow up review.  Nurse Case Manager Clinical Goal(s):  [name] Over the next 14 days, patient will verbalize understanding of plan for post discharge instructions  Interventions:  . Evaluation of current treatment plan related to post discharge instructions following 09/04/19 to 09/06/19 and patient's adherence to plan as established by provider. . Patient states that she is doing ok but has a headache today and a little dizziness.  Advised the patient that if it persist to call the office after hours. She verbalized understanding. Any problems struggling to breath, Chest pain that will not go  away new confusion or can't think clearly, fainting  call  911.  She verbalized understanding. [name] over the Heart failure action plan.  The patient does not have a scale but is going to [place] and states that she can buy one and will write the values down.  . Reviewed medications she did not have her Protonix 40 mg or Carafate 1G /10 ml she is going to pick it up at [facility] today.   Had not been taking Potassium because she said someone had told her to stop taking it.,needs to get multi vitamin.  Took one of her Eliquis 5mg  today because she did not read the discharge papers. Will start on 09/11/19 as on d/c papers  Will Send Note To [name] Pharm D to speak with the patient for medication management. Patient agreed. . Discussed plans for ongoing care management follow up  . Reviewed scheduled appointment with Dr. [name] 10/04/19 @ 930 am. Patient verbalized understanding.  . 09/20/19 . Patient states that she is feeling much better and no more HA . Patient states that she is still having intermittent dizziness, it happens when she is in a lying or sitting position and rises to fast.  Advised the patient when she sits up or stands to remain still for a moment to gain her composure before starting to move, she verbalized understanding. . Patient states she is taking her medications as prescribed.  She needs to go get her albuterol inhaler filled. . The patient states that she is not eating any fried foods and monitoring the salt in  her diet. . Reminded the patient of her appointment in February with Dr [name] . She is not weighing because she does not have a scale.  Have a scale at the office for her.  Tried to call her back but no answer.  10/26/19 o Spoke with the patient and she states that she is still not weighing because she does not have a scale.  I let her know that I have scale for her at the office waiting to be picked up.  She is going to send her husband to the office to get  it. o She denies any chest pain, swelling, she states that she has shortness of breath every now and then. o She states that she is taking her medications, she is still unsure about her potassium if she is supposed to take it.  She does not take it. o I asked her about her appointment with Dr [name] and she stated that she missed it, but she plans to get another appointment. I have asked her to clarify her medications with her PCP o Went through the HF action plan with the patient and she verbalized understanding.  Patient Self Care Activities:  . Performs ADL's independently . Performs IADL's independently . Calls provider office for new concerns or questions . Unable to independently understand post discharge instructions following IP event on 09/04/19 to 09/06/19  Please see past updates related to this goal by clicking on the "Past Updates" button in the selected goal              The patient has been provided with contact information for the care management team and has been advised to call with any health related questions or concerns.   Lazaro Arms RN, BSN, [facility] Care Management Coordinator [place] Phone: [phone] Fax: [phone]

## 2019-11-07 ENCOUNTER — Other Ambulatory Visit: Payer: Self-pay | Admitting: *Deleted

## 2019-11-07 MED ORDER — APIXABAN 5 MG PO TABS: 5.0000 mg | ORAL_TABLET | Freq: Two times a day (BID) | ORAL | 2 refills | Status: DC

## 2019-11-07 NOTE — Telephone Encounter (Signed)
Eliquis 5mg  refill request received, pt is 74yrs old, weight-64kg, Crea-2.06 on 09/05/2019, Diagnosis-Afib , and last seen by Richardson Dopp on 01/10/2019 via Telemedicine and pt was due to return in 3 months, placed a note on the refill to call to schedule an appointment. Dose is appropriate based on dosing criteria. Will send in refill to requested pharmacy.

## 2019-11-08 ENCOUNTER — Other Ambulatory Visit: Payer: Self-pay | Admitting: *Deleted

## 2019-11-08 MED ORDER — ROSUVASTATIN CALCIUM 20 MG PO TABS: 20.0000 mg | ORAL_TABLET | Freq: Every day | ORAL | 2 refills | Status: DC

## 2019-11-08 NOTE — Patient Instructions (Signed)
Visit Information  Goals Addressed            This Visit's Progress   . " I am all confused about the discharge papers" (pt-stated)       Current Barriers:  [name] Knowledge Deficits related to to post discharge instructions following IP event on 09/04/19 to 09/06/19 . Chronic Disease Management support and education needs related to ost discharge instructions ,including  medication review, MD follow up review.  Nurse Case Manager Clinical Goal(s):  [name] Over the next 14 days, patient will verbalize understanding of plan for post discharge instructions  Interventions:  . Evaluation of current treatment plan related to post discharge instructions following 09/04/19 to 09/06/19 and patient's adherence to plan as established by provider. . Patient states that she is doing ok but has a headache today and a little dizziness.  Advised the patient that if it persist to call the office after hours. She verbalized understanding. Any problems struggling to breath, Chest pain that will not go away new confusion or can't think clearly, fainting  call  911.  She verbalized understanding. [name] over the Heart failure action plan.  The patient does not have a scale but is going to [place] and states that she can buy one and will write the values down.  . Reviewed medications she did not have her Protonix 40 mg or Carafate 1G /10 ml she is going to pick it up at [facility] today.   Had not been taking Potassium because she said someone had told her to stop taking it.,needs to get multi vitamin.  Took one of her Eliquis 5mg  today because she did not read the discharge papers. Will start on 09/11/19 as on d/c papers  Will Send Note To [name] Pharm D to speak with the patient for medication management. Patient agreed. . Discussed plans for ongoing care management follow up  . Reviewed scheduled appointment with Dr. [name] 10/04/19 @ 930 am. Patient verbalized understanding.  . 09/20/19 . Patient states that she is feeling  much better and no more HA . Patient states that she is still having intermittent dizziness, it happens when she is in a lying or sitting position and rises to fast.  Advised the patient when she sits up or stands to remain still for a moment to gain her composure before starting to move, she verbalized understanding. . Patient states she is taking her medications as prescribed.  She needs to go get her albuterol inhaler filled. . The patient states that she is not eating any fried foods and monitoring the salt in her diet. . Reminded the patient of her appointment in February with Dr [name] . She is not weighing because she does not have a scale.  Have a scale at the office for her.  Tried to call her back but no answer.  10/26/19 o Spoke with the patient and she states that she is still not weighing because she does not have a scale.  I let her know that I have scale for her at the office waiting to be picked up.  She is going to send her husband to the office to get it. o She denies any chest pain, swelling, she states that she has shortness of breath every now and then. o She states that she is taking her medications, she is still unsure about her potassium if she is supposed to take it.  She does not take it. o I asked her about her appointment  with Dr [name] and she stated that she missed it, but she plans to get another appointment. I have asked her to clarify her medications with her PCP o Went through the HF action plan with the patient and she verbalized understanding.  Patient Self Care Activities:  . Performs ADL's independently . Performs IADL's independently . Calls provider office for new concerns or questions . Unable to independently understand post discharge instructions following IP event on 09/04/19 to 09/06/19  Please see past updates related to this goal by clicking on the "Past Updates" button in the selected goal          Ms. Heckert was given information about Care  Management services today including:  1. Care Management services include personalized support from designated clinical staff supervised by her physician, including individualized plan of care and coordination with other care providers 2. 24/7 contact phone numbers for assistance for urgent and routine care needs. 3. The patient may stop CCM services at any time (effective at the end of the month) by phone call to the office staff.  Patient agreed to services and verbal consent obtained.   The patient verbalized understanding of instructions provided today and declined a print copy of patient instruction materials.   The patient has been provided with contact information for the care management team and has been advised to call with any health related questions or concerns.   Lazaro Arms RN, BSN, [facility] Care Management Coordinator [place] Phone: [phone] Fax: [phone]

## 2019-11-10 ENCOUNTER — Other Ambulatory Visit: Payer: Self-pay

## 2019-11-10 ENCOUNTER — Ambulatory Visit: Payer: Medicare HMO

## 2019-11-10 NOTE — Chronic Care Management (AMB) (Signed)
  Care Management   Outreach Note  11/10/2019 Name: Jennifer Foster MRN: [id number] DOB: 02-Jan-1946  Referred by: [name], MD Reason for referral : Care Coordination (Care Management CHF HTN)   An unsuccessful telephone outreach was attempted today. The patient was referred to the case management team for assistance with care management and care coordination.   Follow Up Plan: Unable to leave a message the phone rang several times with no voice mail pickup.  The care management team will reach out to the patient again over the next 5-7 days.   Lazaro Arms RN, BSN, [facility] Care Management Coordinator [place] Phone: [phone] Fax: [phone]

## 2019-11-14 ENCOUNTER — Other Ambulatory Visit: Payer: Self-pay | Admitting: *Deleted

## 2019-11-14 MED ORDER — ALBUTEROL SULFATE HFA 108 (90 BASE) MCG/ACT IN AERS: 2.0000 | INHALATION_SPRAY | Freq: Four times a day (QID) | RESPIRATORY_TRACT | 3 refills | Status: DC | PRN

## 2019-12-01 ENCOUNTER — Ambulatory Visit: Payer: Medicare HMO | Attending: Internal Medicine

## 2019-12-01 DIAGNOSIS — Z23 Encounter for immunization: Secondary | ICD-10-CM

## 2019-12-01 NOTE — Progress Notes (Signed)
   Covid-19 Vaccination Clinic  Name:  Jennifer Foster    MRN: [id number] DOB: 1946-05-14  12/01/2019  Jennifer Foster was observed post Covid-19 immunization for 15 minutes without incident. She was provided with Vaccine Information Sheet and instruction to access the V-Safe system.   Jennifer Foster was instructed to call 911 with any severe reactions post vaccine: [name] Difficulty breathing  . Swelling of face and throat  . A fast heartbeat  . A bad rash all over body  . Dizziness and weakness   Immunizations Administered    Name Date Dose VIS Date Route   Pfizer COVID-19 Vaccine 12/01/2019  9:15 AM 0.3 mL 08/12/2019 Intramuscular   Manufacturer: [place]   Lot: [id number]   [place]: [id number]

## 2019-12-06 ENCOUNTER — Encounter (HOSPITAL_COMMUNITY): Payer: Self-pay | Admitting: *Deleted

## 2019-12-06 ENCOUNTER — Other Ambulatory Visit: Payer: Self-pay

## 2019-12-06 ENCOUNTER — Ambulatory Visit (INDEPENDENT_AMBULATORY_CARE_PROVIDER_SITE_OTHER): Payer: Medicare HMO | Admitting: Student in an Organized Health Care Education/Training Program

## 2019-12-06 ENCOUNTER — Ambulatory Visit (HOSPITAL_COMMUNITY)
Admission: RE | Admit: 2019-12-06 | Discharge: 2019-12-06 | Disposition: A | Discharge status: Home / Self Care | Payer: Medicare HMO | Source: Ambulatory Visit | Attending: Family Medicine | Admitting: Family Medicine

## 2019-12-06 ENCOUNTER — Emergency Department (HOSPITAL_COMMUNITY)
Admission: EM | Admit: 2019-12-06 | Discharge: 2019-12-06 | Discharge status: Absent without leave | Payer: Medicare HMO | Attending: Emergency Medicine | Admitting: Emergency Medicine

## 2019-12-06 VITALS — BP 145/90 | HR 58

## 2019-12-06 DIAGNOSIS — I824Z2 Acute embolism and thrombosis of unspecified deep veins of left distal lower extremity: Secondary | ICD-10-CM

## 2019-12-06 DIAGNOSIS — R600 Localized edema: Secondary | ICD-10-CM | POA: Diagnosis not present

## 2019-12-06 MED ORDER — PREDNISONE 20 MG PO TABS: 40.0000 mg | ORAL_TABLET | Freq: Every day | ORAL | 0 refills | Status: AC

## 2019-12-06 NOTE — ED Provider Notes (Signed)
Pt checked in to ED. Pt was seen at [facility] practice and was sent here to go to Admissions for an Ultrasound of her leg. Vitals stable,    Sidney Ace 12/06/19 1335    Truddie Hidden, MD 12/06/19 [phone]

## 2019-12-06 NOTE — Progress Notes (Signed)
Lower venous duplex       has been completed. Preliminary results can be found under CV proc through chart review. Nancie Bocanegra, BS, RDMS, RVT   

## 2019-12-06 NOTE — Patient Instructions (Addendum)
It was a pleasure to see you today!  To summarize our discussion for this visit:  I am sorry to hear that you are in so much pain.  With your foot being so painful and swollen we are ordering an ultrasound of your veins to make sure there are no blood clots.  Please go to [facility] at 1pm today to get that test done. Thank you.  I will also send in a prescription for steroids to treat for possible gout.  Please take 40 mg of the prednisone per day up to 7 days.  If your pain is improving with this treatment I would recommend taking it for about 2 days after has resolved.   Please return to our clinic to see [place] as needed.  Call the clinic at [phone] if your symptoms worsen or you have any concerns.   Thank you for allowing me to take part in your care,  Dr. Doristine Mango   Gout  Gout is painful swelling of your joints. Gout is a type of arthritis. It is caused by having too much uric acid in your body. Uric acid is a chemical that is made when your body breaks down substances called purines. If your body has too much uric acid, sharp crystals can form and build up in your joints. This causes pain and swelling. Gout attacks can happen quickly and be very painful (acute gout). Over time, the attacks can affect more joints and happen more often (chronic gout). What are the causes?  Too much uric acid in your blood. This can happen because: ? Your kidneys do not remove enough uric acid from your blood. ? Your body makes too much uric acid. ? You eat too many foods that are high in purines. These foods include organ meats, some seafood, and beer.  Trauma or stress. What increases the risk?  Having a family history of gout.  Being female and middle-aged.  Being female and having gone through menopause.  Being very overweight (obese).  Drinking alcohol, especially beer.  Not having enough water in the body (being dehydrated).  Losing weight too quickly.  Having an  organ transplant.  Having lead poisoning.  Taking certain medicines.  Having kidney disease.  Having a skin condition called psoriasis. What are the signs or symptoms? An attack of acute gout usually happens in just one joint. The most common place is the big toe. Attacks often start at night. Other joints that may be affected include joints of the feet, ankle, knee, fingers, wrist, or elbow. Symptoms of an attack may include:  Very bad pain.  Warmth.  Swelling.  Stiffness.  Shiny, red, or purple skin.  Tenderness. The affected joint may be very painful to touch.  Chills and fever. Chronic gout may cause symptoms more often. More joints may be involved. You may also have white or yellow lumps (tophi) on your hands or feet or in other areas near your joints. How is this treated?  Treatment for this condition has two phases: treating an acute attack and preventing future attacks.  Acute gout treatment may include: ? NSAIDs. ? Steroids. These are taken by mouth or injected into a joint. ? Colchicine. This medicine relieves pain and swelling. It can be given by mouth or through an IV tube.  Preventive treatment may include: ? Taking small doses of NSAIDs or colchicine daily. ? Using a medicine that reduces uric acid levels in your blood. ? Making changes to your diet. You  may need to see a food expert (dietitian) about what to eat and drink to prevent gout. Follow these instructions at home: During a gout attack   If told, put ice on the painful area: ? Put ice in a plastic bag. ? Place a towel between your skin and the bag. ? Leave the ice on for 20 minutes, 2-3 times a day.  Raise (elevate) the painful joint above the level of your heart as often as you can.  Rest the joint as much as possible. If the joint is in your leg, you may be given crutches.  Follow instructions from your doctor about what you cannot eat or drink. Avoiding future gout attacks  Eat a  low-purine diet. Avoid foods and drinks such as: ? Liver. ? Kidney. ? Anchovies. ? Asparagus. ? Herring. ? Mushrooms. ? Mussels. ? Beer.  Stay at a healthy weight. If you want to lose weight, talk with your doctor. Do not lose weight too fast.  Start or continue an exercise plan as told by your doctor. Eating and drinking  Drink enough fluids to keep your pee (urine) pale yellow.  If you drink alcohol: ? Limit how much you use to:  0-1 drink a day for women.  0-2 drinks a day for men. ? Be aware of how much alcohol is in your drink. In the [place] one drink equals one 12 oz bottle of beer (355 mL), one 5 oz glass of wine (148 mL), or one 1 oz glass of hard liquor (44 mL). General instructions  Take over-the-counter and prescription medicines only as told by your doctor.  Do not drive or use heavy machinery while taking prescription pain medicine.  Return to your normal activities as told by your doctor. Ask your doctor what activities are safe for you.  Keep all follow-up visits as told by your doctor. This is important. Contact a doctor if:  You have another gout attack.  You still have symptoms of a gout attack after 10 days of treatment.  You have problems (side effects) because of your medicines.  You have chills or a fever.  You have burning pain when you pee (urinate).  You have pain in your lower back or belly. Get help right away if:  You have very bad pain.  Your pain cannot be controlled.  You cannot pee. Summary  Gout is painful swelling of the joints.  The most common site of pain is the big toe, but it can affect other joints.  Medicines and avoiding some foods can help to prevent and treat gout attacks. This information is not intended to replace advice given to you by your health care provider. Make sure you discuss any questions you have with your health care provider. Document Revised: 03/10/2018 Document Reviewed: 03/10/2018 Elsevier  Patient Education  Jennifer Foster.

## 2019-12-06 NOTE — Progress Notes (Signed)
    SUBJECTIVE:   CHIEF COMPLAINT / HPI: left foot swelling  Patient is an otherwise normal state of health yesterday.  She woke up this morning with extreme pain in her left foot and noticed asymmetrical swelling.  Denies any known trauma to the foot.  Has not had swelling like this before and is different from the past gout flares or lower extremity edema from CHF.  She has been taking all of her medications including Lasix as prescribed.  Her pain is so great that it is difficult to walk on the foot and she had to be wheeled in a wheelchair to the appointment.  Denies any rashes or fevers. Patient has not been taking Eliquis for 1 month as she was told to discontinue it from cardiology, per patient.  PERTINENT  PMH / PSH: Previously on Eliquis for A. fib  OBJECTIVE:   BP (!) 145/90   Pulse (!) 58   SpO2 99%   General: NAD, pleasant, able to participate in exam Extremities: Trace edema and no tenderness to palpation of right foot. Left foot has acute tenderness to palpation over metatarsals. Loss of anatomical markers due to edema of midfoot. Skin taught. No edema proximal to ankle. No erythematous lesions. Full ROM of digits.  Skin: warm and dry, no rashes noted Neuro: alert and oriented x4, no focal deficits Psych: Normal affect and mood  ASSESSMENT/PLAN:   Edema of left foot History of gout. This is different than past flares.  Also has history of GI bleeds and sensitivity to NSAIDs. Treating for gout flare with 40mg  prednisone up to 7 days and given instructions to continue treatment 2 days beyond improvement of symptoms up to 7 days. Since not on anticoagulation, will obtain stat DVT evaluation but no history of blood clots.  Low suspicion for cellulitis without spreading skin changes but precautioned patient to call back if getting worse or fevers.     [place]

## 2019-12-06 NOTE — Assessment & Plan Note (Signed)
History of gout. This is different than past flares.  Also has history of GI bleeds and sensitivity to NSAIDs. Treating for gout flare with 40mg  prednisone up to 7 days and given instructions to continue treatment 2 days beyond improvement of symptoms up to 7 days. Since not on anticoagulation, will obtain stat DVT evaluation but no history of blood clots.  Low suspicion for cellulitis without spreading skin changes but precautioned patient to call back if getting worse or fevers.

## 2019-12-06 NOTE — ED Notes (Signed)
Patient arrived in error as she has an appointment with vascular. Vascular made aware patient is in ED and available for appointment at this time.

## 2019-12-06 NOTE — ED Triage Notes (Signed)
Has history of Gout, notice swelling and pain in left toes/foot this morning

## 2019-12-07 ENCOUNTER — Other Ambulatory Visit: Payer: Self-pay | Admitting: Physician Assistant

## 2019-12-07 ENCOUNTER — Other Ambulatory Visit: Payer: Self-pay

## 2019-12-07 MED ORDER — AMLODIPINE BESYLATE 5 MG PO TABS: ORAL_TABLET | ORAL | 0 refills | Status: DC

## 2019-12-25 ENCOUNTER — Telehealth: Payer: Self-pay | Admitting: Family Medicine

## 2019-12-25 ENCOUNTER — Emergency Department (HOSPITAL_COMMUNITY): Payer: Medicare HMO

## 2019-12-25 ENCOUNTER — Other Ambulatory Visit: Payer: Self-pay

## 2019-12-25 ENCOUNTER — Observation Stay (HOSPITAL_COMMUNITY)
Admission: EM | Admit: 2019-12-25 | Discharge: 2019-12-27 | Disposition: A | Discharge status: Home / Self Care | Payer: Medicare HMO | Attending: Internal Medicine | Admitting: Internal Medicine

## 2019-12-25 ENCOUNTER — Encounter (HOSPITAL_COMMUNITY): Payer: Self-pay | Admitting: Emergency Medicine

## 2019-12-25 DIAGNOSIS — I130 Hypertensive heart and chronic kidney disease with heart failure and stage 1 through stage 4 chronic kidney disease, or unspecified chronic kidney disease: Secondary | ICD-10-CM | POA: Diagnosis not present

## 2019-12-25 DIAGNOSIS — R9431 Abnormal electrocardiogram [ECG] [EKG]: Secondary | ICD-10-CM | POA: Insufficient documentation

## 2019-12-25 DIAGNOSIS — F411 Generalized anxiety disorder: Secondary | ICD-10-CM | POA: Diagnosis not present

## 2019-12-25 DIAGNOSIS — I5032 Chronic diastolic (congestive) heart failure: Secondary | ICD-10-CM | POA: Diagnosis not present

## 2019-12-25 DIAGNOSIS — J45909 Unspecified asthma, uncomplicated: Secondary | ICD-10-CM | POA: Diagnosis not present

## 2019-12-25 DIAGNOSIS — R634 Abnormal weight loss: Secondary | ICD-10-CM | POA: Diagnosis not present

## 2019-12-25 DIAGNOSIS — Z79899 Other long term (current) drug therapy: Secondary | ICD-10-CM | POA: Diagnosis not present

## 2019-12-25 DIAGNOSIS — Z87891 Personal history of nicotine dependence: Secondary | ICD-10-CM | POA: Diagnosis not present

## 2019-12-25 DIAGNOSIS — J189 Pneumonia, unspecified organism: Principal | ICD-10-CM | POA: Insufficient documentation

## 2019-12-25 DIAGNOSIS — D696 Thrombocytopenia, unspecified: Secondary | ICD-10-CM | POA: Insufficient documentation

## 2019-12-25 DIAGNOSIS — Z20822 Contact with and (suspected) exposure to covid-19: Secondary | ICD-10-CM | POA: Insufficient documentation

## 2019-12-25 DIAGNOSIS — E785 Hyperlipidemia, unspecified: Secondary | ICD-10-CM | POA: Diagnosis not present

## 2019-12-25 DIAGNOSIS — K219 Gastro-esophageal reflux disease without esophagitis: Secondary | ICD-10-CM | POA: Insufficient documentation

## 2019-12-25 DIAGNOSIS — R7989 Other specified abnormal findings of blood chemistry: Secondary | ICD-10-CM | POA: Insufficient documentation

## 2019-12-25 DIAGNOSIS — D631 Anemia in chronic kidney disease: Secondary | ICD-10-CM | POA: Diagnosis not present

## 2019-12-25 DIAGNOSIS — I712 Thoracic aortic aneurysm, without rupture: Secondary | ICD-10-CM | POA: Diagnosis not present

## 2019-12-25 DIAGNOSIS — I4821 Permanent atrial fibrillation: Secondary | ICD-10-CM | POA: Insufficient documentation

## 2019-12-25 DIAGNOSIS — I2510 Atherosclerotic heart disease of native coronary artery without angina pectoris: Secondary | ICD-10-CM | POA: Diagnosis not present

## 2019-12-25 DIAGNOSIS — R0602 Shortness of breath: Secondary | ICD-10-CM | POA: Diagnosis not present

## 2019-12-25 DIAGNOSIS — E876 Hypokalemia: Secondary | ICD-10-CM | POA: Diagnosis not present

## 2019-12-25 DIAGNOSIS — R001 Bradycardia, unspecified: Secondary | ICD-10-CM | POA: Insufficient documentation

## 2019-12-25 DIAGNOSIS — I700 Atherosclerosis of aorta: Secondary | ICD-10-CM | POA: Diagnosis not present

## 2019-12-25 DIAGNOSIS — I10 Essential (primary) hypertension: Secondary | ICD-10-CM | POA: Diagnosis present

## 2019-12-25 DIAGNOSIS — N1832 Chronic kidney disease, stage 3b: Secondary | ICD-10-CM | POA: Insufficient documentation

## 2019-12-25 DIAGNOSIS — I4891 Unspecified atrial fibrillation: Secondary | ICD-10-CM | POA: Diagnosis present

## 2019-12-25 DIAGNOSIS — N184 Chronic kidney disease, stage 4 (severe): Secondary | ICD-10-CM | POA: Diagnosis present

## 2019-12-25 LAB — TROPONIN I (HIGH SENSITIVITY)
Troponin I (High Sensitivity): 30 ng/L — ABNORMAL HIGH (ref <18)
Troponin I (High Sensitivity): 28 ng/L — ABNORMAL HIGH (ref <18)
Troponin I (High Sensitivity): 28 ng/L — ABNORMAL HIGH (ref <18)

## 2019-12-25 LAB — CBC
HCT: 25.7 % — ABNORMAL LOW (ref 36.0–46.0)
Hemoglobin: 8.6 g/dL — ABNORMAL LOW (ref 12.0–15.0)
MCH: 26.5 pg (ref 26.0–34.0)
MCHC: 33.5 g/dL (ref 30.0–36.0)
MCV: 79.3 fL — ABNORMAL LOW (ref 80.0–100.0)
Platelets: 141 10*3/uL — ABNORMAL LOW (ref 150–400)
RBC: 3.24 MIL/uL — ABNORMAL LOW (ref 3.87–5.11)
RDW: 15.5 % (ref 11.5–15.5)
WBC: 4.8 10*3/uL (ref 4.0–10.5)
nRBC: 0 % (ref 0.0–0.2)

## 2019-12-25 LAB — BASIC METABOLIC PANEL
Anion gap: 12 (ref 5–15)
BUN: 14 mg/dL (ref 8–23)
CO2: 26 mmol/L (ref 22–32)
Calcium: 9.3 mg/dL (ref 8.9–10.3)
Chloride: 106 mmol/L (ref 98–111)
Creatinine, Ser: 2.36 mg/dL — ABNORMAL HIGH (ref 0.44–1.00)
GFR calc Af Amer: 23 mL/min — ABNORMAL LOW (ref >60)
GFR calc non Af Amer: 20 mL/min — ABNORMAL LOW (ref >60)
Glucose, Bld: 84 mg/dL (ref 70–99)
Potassium: 2.8 mmol/L — ABNORMAL LOW (ref 3.5–5.1)
Sodium: 144 mmol/L (ref 135–145)

## 2019-12-25 LAB — LACTIC ACID, PLASMA: Lactic Acid, Venous: 0.7 mmol/L (ref 0.5–1.9)

## 2019-12-25 MED ORDER — SODIUM CHLORIDE 0.9 % IV SOLN
500.0000 mg | Freq: Once | INTRAVENOUS | Status: AC
  Administered 2019-12-25: 500 mg via INTRAVENOUS
  Filled 2019-12-25: qty 500

## 2019-12-25 MED ORDER — SUCRALFATE 1 GM/10ML PO SUSP
1.0000 g | Freq: Three times a day (TID) | ORAL | Status: DC
  Administered 2019-12-25 – 2019-12-27 (×8): 1 g via ORAL
  Filled 2019-12-25 (×7): qty 10

## 2019-12-25 MED ORDER — ROSUVASTATIN CALCIUM 20 MG PO TABS
20.0000 mg | ORAL_TABLET | Freq: Every day | ORAL | Status: DC
  Administered 2019-12-25 – 2019-12-26 (×2): 20 mg via ORAL
  Filled 2019-12-25 (×2): qty 2
  Filled 2019-12-25 (×2): qty 1

## 2019-12-25 MED ORDER — SODIUM CHLORIDE 0.9 % IV SOLN
1.0000 g | Freq: Once | INTRAVENOUS | Status: AC
  Administered 2019-12-25: 1 g via INTRAVENOUS
  Filled 2019-12-25: qty 10

## 2019-12-25 MED ORDER — AMLODIPINE BESYLATE 5 MG PO TABS
5.0000 mg | ORAL_TABLET | Freq: Once | ORAL | Status: AC
  Administered 2019-12-25: 15:00:00 5 mg via ORAL
  Filled 2019-12-25: qty 1

## 2019-12-25 MED ORDER — AZITHROMYCIN 250 MG PO TABS
500.0000 mg | ORAL_TABLET | Freq: Every day | ORAL | Status: DC
  Administered 2019-12-26 – 2019-12-27 (×2): 500 mg via ORAL
  Filled 2019-12-25 (×2): qty 2

## 2019-12-25 MED ORDER — NITROGLYCERIN 0.4 MG SL SUBL: 0.4000 mg | SUBLINGUAL_TABLET | SUBLINGUAL | Status: DC | PRN

## 2019-12-25 MED ORDER — METOPROLOL SUCCINATE ER 25 MG PO TB24
25.0000 mg | ORAL_TABLET | Freq: Every day | ORAL | Status: DC
  Administered 2019-12-26: 25 mg via ORAL
  Filled 2019-12-25: qty 1

## 2019-12-25 MED ORDER — PANTOPRAZOLE SODIUM 40 MG PO TBEC
40.0000 mg | DELAYED_RELEASE_TABLET | Freq: Two times a day (BID) | ORAL | Status: DC
  Administered 2019-12-25 – 2019-12-27 (×4): 40 mg via ORAL
  Filled 2019-12-25 (×4): qty 1

## 2019-12-25 MED ORDER — ISOSORBIDE MONONITRATE ER 30 MG PO TB24
30.0000 mg | ORAL_TABLET | Freq: Every day | ORAL | Status: DC
  Administered 2019-12-25 – 2019-12-27 (×3): 30 mg via ORAL
  Filled 2019-12-25 (×3): qty 1

## 2019-12-25 MED ORDER — APIXABAN 5 MG PO TABS
5.0000 mg | ORAL_TABLET | Freq: Two times a day (BID) | ORAL | Status: DC
  Administered 2019-12-26 – 2019-12-27 (×3): 5 mg via ORAL
  Filled 2019-12-25 (×5): qty 1

## 2019-12-25 MED ORDER — POTASSIUM CHLORIDE CRYS ER 20 MEQ PO TBCR
40.0000 meq | EXTENDED_RELEASE_TABLET | ORAL | Status: AC
  Administered 2019-12-25 (×2): 40 meq via ORAL
  Filled 2019-12-25 (×2): qty 2

## 2019-12-25 MED ORDER — ACETAMINOPHEN 650 MG RE SUPP: 650.0000 mg | Freq: Four times a day (QID) | RECTAL | Status: DC | PRN

## 2019-12-25 MED ORDER — IPRATROPIUM-ALBUTEROL 0.5-2.5 (3) MG/3ML IN SOLN: 3.0000 mL | RESPIRATORY_TRACT | Status: DC | PRN

## 2019-12-25 MED ORDER — ACETAMINOPHEN 325 MG PO TABS: 650.0000 mg | ORAL_TABLET | Freq: Four times a day (QID) | ORAL | Status: DC | PRN

## 2019-12-25 MED ORDER — SODIUM CHLORIDE 0.9 % IV SOLN
2.0000 g | INTRAVENOUS | Status: DC
  Administered 2019-12-26 – 2019-12-27 (×2): 2 g via INTRAVENOUS
  Filled 2019-12-25 (×2): qty 2

## 2019-12-25 MED ORDER — ONDANSETRON HCL 4 MG/2ML IJ SOLN: 4.0000 mg | Freq: Four times a day (QID) | INTRAMUSCULAR | Status: DC | PRN

## 2019-12-25 MED ORDER — AMLODIPINE BESYLATE 5 MG PO TABS
5.0000 mg | ORAL_TABLET | Freq: Two times a day (BID) | ORAL | Status: DC
  Administered 2019-12-25 – 2019-12-27 (×4): 5 mg via ORAL
  Filled 2019-12-25 (×4): qty 1

## 2019-12-25 MED ORDER — ESCITALOPRAM OXALATE 10 MG PO TABS
10.0000 mg | ORAL_TABLET | Freq: Every day | ORAL | Status: DC
  Administered 2019-12-26 – 2019-12-27 (×2): 10 mg via ORAL
  Filled 2019-12-25 (×2): qty 1

## 2019-12-25 MED ORDER — ONDANSETRON HCL 4 MG PO TABS: 4.0000 mg | ORAL_TABLET | Freq: Four times a day (QID) | ORAL | Status: DC | PRN

## 2019-12-25 MED ORDER — METOPROLOL SUCCINATE ER 25 MG PO TB24
25.0000 mg | ORAL_TABLET | Freq: Every day | ORAL | Status: DC
  Filled 2019-12-25: qty 1

## 2019-12-25 MED ORDER — SODIUM CHLORIDE 0.9 % IV SOLN: 2.0000 g | INTRAVENOUS | Status: DC

## 2019-12-25 MED ORDER — ALBUTEROL SULFATE HFA 108 (90 BASE) MCG/ACT IN AERS: 2.0000 | INHALATION_SPRAY | Freq: Four times a day (QID) | RESPIRATORY_TRACT | Status: DC | PRN

## 2019-12-25 NOTE — ED Notes (Signed)
Dr. Alvino Chapel made aware of pt's drop in O2 while ambulating and about pt's elevated BP.

## 2019-12-25 NOTE — H&P (Addendum)
History and Physical    Jennifer Foster AGT:[id number] DOB: 1945-12-06 DOA: 12/25/2019  PCP: [name], MD   Patient coming from: Home  I have personally briefly reviewed patient's old medical records in [place]  Chief Complaint: Shortness of breath  HPI: Jennifer Foster is a 74 y.o. female with medical history significant of hypertension, CAD, chronic diastolic CHF, permanent A. fib for which she is supposed to be on Eliquis but has not been on it for few months now, upper GI bleeding in January 2021 requiring EGD which showed nonbleeding gastric ulcers and she was supposed to resume Eliquis after 5 days of discharge, chronic renal disease stage IIIb, hyperlipidemia, anxiety presented with shortness of breath for the last week.  Patient states that she has been having cough with greenish-brown sputum along with shortness of breath which is worse with walking.  Denies any chest pain, palpitations, fevers, nausea, vomiting, abdominal pain, diarrhea, dysuria or swelling of her legs.  She is supposed to have her second Covid shot tomorrow.  No known sick contacts.  ED Course: Her oxygen saturations were dropping to the 80s on ambulation.  Chest x-ray was suggestive of pneumonia.  She was given Rocephin and Zithromax.  Hospital service was called to evaluate the patient.  Review of Systems: As per HPI otherwise all other systems were reviewed and are negative.   Past Medical History:  Diagnosis Date  . Abnormal stress test 12/03/2017  . Acute blood loss anemia 06/26/2015  . Acute on chronic blood loss anemia 06/26/2015  . Acute respiratory failure with hypoxia ([place]) 02/21/2018  . Aortic atherosclerosis ([place]) 01/01/2018   CT 04/2017  . Asthma   . Atrial fibrillation ([place]) 10/13/2017   Newly diagnosed in Jan 2019 // Apixaban for anticoag // Apixaban held in 2019 for anemia but resumed; Hgb stable since restarting  . CAD (coronary artery disease) 11/27/2017   Nuc stress 3/19: anterior  ischemia, ?inf scar, EF 50, intermediate risk // LHC 4/19: LAD mild dz, pLCx 35, OM2 30, mRCA 60, dRCA 65, LVEDP 15-20  . CAP (community acquired pneumonia) 02/21/2018  . Chronic diastolic CHF (congestive heart failure) ([place]) 03/23/2018   Echo 2/19: Moderate LVH, EF 22-48, grade 2 diastolic dysfunction, trivial MR, moderate to severe LAE, PASP 30  . Chronic kidney disease (CKD), stage III (moderate) 06/27/2015  . DIVERTICULAR BLEEDING, HX OF 10/14/2007   Colonoscopy 2008 showed diverticulosis.   . Dizziness 11/25/2016  . GERD (gastroesophageal reflux disease)   . Gout   . History of anemia   . History of asthma   . Hyperlipemia   . Hypertension   . Joint pain   . Lipoma   . Proteinuria 01/11/2016  . Thoracic aortic aneurysm 04/03/2017   CT 8/18: ascending thoracic aorta 4.1 cm // unable to do CTA due to CKD // Chest MRA 05/2019: Ascending thoracic aorta 40 mm    Past Surgical History:  Procedure Laterality Date  . ABDOMINAL HYSTERECTOMY  1981   partial, per pt history  . BIOPSY  09/05/2019   Procedure: BIOPSY;  Surgeon: Ronnette Juniper, MD;  Location: [facility];  Service: Gastroenterology;;  . COLONOSCOPY N/A 12/08/2013   Procedure: COLONOSCOPY;  Surgeon: [name], MD;  Location: [facility];  Service: Endoscopy;  Laterality: N/A;  . COLONOSCOPY N/A 06/28/2015   Procedure: COLONOSCOPY;  Surgeon: Clarene Essex, MD;  Location: [facility] ENDOSCOPY;  Service: Endoscopy;  Laterality: N/A;  . ESOPHAGOGASTRODUODENOSCOPY N/A 12/08/2013   Procedure: ESOPHAGOGASTRODUODENOSCOPY (EGD);  Surgeon: [name], MD;  Location: [facility] ENDOSCOPY;  Service: Endoscopy;  Laterality: N/A;  . ESOPHAGOGASTRODUODENOSCOPY (EGD) WITH PROPOFOL N/A 09/05/2019   Procedure: ESOPHAGOGASTRODUODENOSCOPY (EGD) WITH PROPOFOL;  Surgeon: Ronnette Juniper, MD;  Location: [facility] ENDOSCOPY;  Service: Gastroenterology;  Laterality: N/A;  . GIVENS CAPSULE STUDY N/A 12/08/2013   Procedure: GIVENS CAPSULE STUDY;  Surgeon: [name], MD;  Location:  [facility];  Service: Endoscopy;  Laterality: N/A;  . LEFT HEART CATH AND CORONARY ANGIOGRAPHY N/A 12/03/2017   Procedure: LEFT HEART CATH AND CORONARY ANGIOGRAPHY;  Surgeon: Nelva Bush, MD;  Location: [facility];  Service: Cardiovascular;  Laterality: N/A;  . LIPOMA EXCISION  01/28/11   neck   Social history  reports that she has quit smoking. Her smoking use included cigarettes. She smoked 0.25 packs per day. She has never used smokeless tobacco. She reports current alcohol use. She reports that she does not use drugs.  Allergies  Allergen Reactions  . Ace Inhibitors Swelling and Other (See Comments)    Eyes swell  . Lisinopril Swelling and Other (See Comments)    Swollen tongue   . Tramadol Itching    Family History  Problem Relation Age of Onset  . Diabetes type II Other   . Kidney failure Other   . Kidney failure Son     Prior to Admission medications   Medication Sig Start Date End Date Taking? Authorizing Provider  albuterol (VENTOLIN HFA) 108 (90 Base) MCG/ACT inhaler Inhale 2 puffs into the lungs every 6 (six) hours as needed for wheezing or shortness of breath. 11/14/19  Yes Eniola, Tawanna Solo T, MD  amLODipine (NORVASC) 5 MG tablet TAKE 1 TABLET(5 MG) BY MOUTH TWICE DAILY. Please make yearly appt with Dr. Acie Fredrickson for May for future refills. 1st attempt Patient taking differently: Take 5 mg by mouth in the morning and at bedtime.  12/07/19  Yes Nahser, Wonda Cheng, MD  Baclofen 5 MG TABS Take 1 tablet by mouth every 12 (twelve) hours as needed. 09/23/19  Yes [name], MD  calcitRIOL (ROCALTROL) 0.25 MCG capsule Take 0.25 mcg by mouth daily.  12/24/18  Yes [provider]  Cod Liver Oil 1000 MG CAPS Take 1,000 mg by mouth daily.    Yes [provider]  escitalopram (LEXAPRO) 10 MG tablet TAKE 1 TABLET(10 MG) BY MOUTH DAILY Patient taking differently: Take 10 mg by mouth daily. TAKE 1 TABLET(10 MG) BY MOUTH DAILY 08/31/19  Yes Eniola, Kehinde T,  MD  furosemide (LASIX) 40 MG tablet TAKE 1/2 TABLET(20 MG) BY MOUTH DAILY Patient taking differently: Take 20 mg by mouth daily.  09/08/19  Yes End, Harrell Gave, MD  ipratropium-albuterol (DUONEB) 0.5-2.5 (3) MG/3ML SOLN Take 3 mLs by nebulization every 4 (four) hours as needed. Patient taking differently: Take 3 mLs by nebulization every 4 (four) hours as needed (wheezing/shortness of breath).  08/27/18  Yes Raylene Everts, MD  irbesartan (AVAPRO) 150 MG tablet TAKE 1 TABLET BY MOUTH EVERY DAY Patient taking differently: Take 150 mg by mouth daily.  08/16/19  Yes Weaver, Scott T, PA-C  isosorbide mononitrate (IMDUR) 30 MG 24 hr tablet Take 1 tablet (30 mg total) by mouth daily. 01/10/19 01/10/20 Yes Weaver, Scott T, PA-C  metoprolol succinate (TOPROL-XL) 25 MG 24 hr tablet TAKE 1 TABLET BY MOUTH EVERY DAY(KEEP UPCOMING APPT IN MAY FOR FUTURE REFILLS) Patient taking differently: Take 25 mg by mouth daily.  04/12/19  Yes Richardson Dopp T, PA-C  Multiple Vitamin (MULTIVITAMIN  WITH MINERALS) TABS tablet Take 1 tablet by mouth daily.   Yes [provider]  nitroGLYCERIN (NITROSTAT) 0.4 MG SL tablet Place 1 tablet (0.4 mg total) under the tongue every 5 (five) minutes as needed for chest pain. If do not resolve after 1 tablet, go to ED 10/13/17  Yes Smiley Houseman, MD  pantoprazole (PROTONIX) 40 MG tablet Take 1 tablet (40 mg total) by mouth 2 (two) times daily before a meal. 09/06/19 12/25/19 Yes Adhikari, Tamsen Meek, MD  rosuvastatin (CRESTOR) 20 MG tablet Take 1 tablet (20 mg total) by mouth at bedtime. 11/08/19  Yes [name], MD  apixaban (ELIQUIS) 5 MG TABS tablet Take 1 tablet (5 mg total) by mouth 2 (two) times daily. Call to schedule Cardiologist appt. Thanks 11/07/19   Nahser, Wonda Cheng, MD  potassium chloride SA (KLOR-CON) 20 MEQ tablet Take 0.5 tablets (10 mEq total) by mouth daily. Patient not taking: Reported on 10/26/2019 07/15/19   Richardson Dopp T, PA-C  sucralfate (CARAFATE) 1  GM/10ML suspension Take 10 mLs (1 g total) by mouth 4 (four) times daily -  with meals and at bedtime. 09/06/19 10/06/19  Shelly Coss, MD    Physical Exam: Vitals:   12/25/19 1130 12/25/19 1230 12/25/19 1300 12/25/19 1500  BP: (!) 181/89  (!) 198/72 (!) 192/77  Pulse: (!) 57 (!) 52 67 61  Resp: 14 (!) 22 (!) 27 (!) 27  Temp:      TempSrc:      SpO2: 98% 96% 96% 98%    Constitutional: NAD, calm, comfortable.  Elderly female.  Lying in bed.  Poor historian. Vitals:   12/25/19 1130 12/25/19 1230 12/25/19 1300 12/25/19 1500  BP: (!) 181/89  (!) 198/72 (!) 192/77  Pulse: (!) 57 (!) 52 67 61  Resp: 14 (!) 22 (!) 27 (!) 27  Temp:      TempSrc:      SpO2: 98% 96% 96% 98%   Eyes: PERRL, lids and conjunctivae normal ENMT: Mucous membranes are moist. Posterior pharynx clear of any exudate or lesions. Neck: normal, supple, no masses, no thyromegaly Respiratory: bilateral decreased breath sounds at bases, no wheezing; some scattered crackles intermittent tachypnea.  No accessory muscle use.  Cardiovascular: S1 S2 positive; intermittently bradycardic. No extremity edema. 2+ pedal pulses.  Abdomen: no tenderness, no masses palpated. No hepatosplenomegaly. Bowel sounds positive.  Musculoskeletal: no clubbing / cyanosis. No joint deformity upper and lower extremities.  Skin: no rashes, lesions, ulcers. No induration Neurologic: CN 2-12 grossly intact. Moving extremities. No focal neurologic deficits.  Psychiatric: Flat affect.  Alert and oriented x 3.    Labs on Admission: I have personally reviewed following labs and imaging studies  CBC: Recent Labs  Lab 12/25/19 1207  WBC 4.8  HGB 8.6*  HCT 25.7*  MCV 79.3*  PLT 324*   Basic Metabolic Panel: Recent Labs  Lab 12/25/19 1207  NA 144  K 2.8*  CL 106  CO2 26  GLUCOSE 84  BUN 14  CREATININE 2.36*  CALCIUM 9.3   GFR: CrCl cannot be calculated (Unknown ideal weight.). Liver Function Tests: No results for input(s): AST, ALT,  ALKPHOS, BILITOT, PROT, ALBUMIN in the last 168 hours. No results for input(s): LIPASE, AMYLASE in the last 168 hours. No results for input(s): AMMONIA in the last 168 hours. Coagulation Profile: No results for input(s): INR, PROTIME in the last 168 hours. Cardiac Enzymes: No results for input(s): CKTOTAL, CKMB, CKMBINDEX, TROPONINI in the last 168 hours. BNP (  last 3 results) No results for input(s): PROBNP in the last 8760 hours. HbA1C: No results for input(s): HGBA1C in the last 72 hours. CBG: No results for input(s): GLUCAP in the last 168 hours. Lipid Profile: No results for input(s): CHOL, HDL, LDLCALC, TRIG, CHOLHDL, LDLDIRECT in the last 72 hours. Thyroid Function Tests: No results for input(s): TSH, T4TOTAL, FREET4, T3FREE, THYROIDAB in the last 72 hours. Anemia Panel: No results for input(s): VITAMINB12, FOLATE, FERRITIN, TIBC, IRON, RETICCTPCT in the last 72 hours. Urine analysis:    Component Value Date/Time   COLORURINE YELLOW 09/04/2019 2329   APPEARANCEUR CLEAR 09/04/2019 2329   LABSPEC 1.010 09/04/2019 2329   PHURINE 5.0 09/04/2019 2329   GLUCOSEU NEGATIVE 09/04/2019 2329   HGBUR NEGATIVE 09/04/2019 2329   BILIRUBINUR NEGATIVE 09/04/2019 2329   BILIRUBINUR SMALL 03/18/2016 1054   KETONESUR NEGATIVE 09/04/2019 2329   PROTEINUR 100 (A) 09/04/2019 2329   UROBILINOGEN 0.2 03/18/2016 1054   UROBILINOGEN 0.2 06/25/2015 0920   NITRITE NEGATIVE 09/04/2019 2329   LEUKOCYTESUR TRACE (A) 09/04/2019 2329    Radiological Exams on Admission: DG Chest 2 View  Result Date: 12/25/2019 CLINICAL DATA:  Shortness of breath. EXAM: CHEST - 2 VIEW COMPARISON:  February 24, 2018 FINDINGS: No pneumothorax. Stable cardiomegaly. The hila and mediastinum are normal. The right lung is clear. Mild infiltrate in the left upper lung. No other acute abnormalities. Suggested tiny effusions. IMPRESSION: 1. Mild opacity in the left upper lung worrisome for pneumonia given history. Recommend  short-term follow-up to ensure resolution. 2. Small pleural effusions.  Cardiomegaly. Electronically Signed   By: Dorise Bullion III M.D   On: 12/25/2019 12:01    EKG: Independently reviewed.  Atrial fibrillation at 55 bpm.  No ST elevations or depressions.   Assessment/Plan  Community-acquired pneumonia -Chest x-ray on presentation showed mild opacities in the left upper lobe worrisome for pneumonia. -Continue Rocephin and Zithromax.  Follow cultures.  Urine Legionella and streptococcal antigen. -COVID-19 testing is pending. -Oxygen supplementation if needed  Hypertension -Blood pressure extremely elevated.  Continue home regimen except for Lasix, irbesartan.  Hold metoprolol today as she is intermittently bradycardic.  Hypokalemia -Replace.  Repeat a.m. labs  Pulmonary atrial fibrillation -Has mild bradycardia.  Hold metoprolol dose today.  Resume Eliquis.  Unclear why patient has not been taking Eliquis at home.  Prior discharge summary from January 2021 indicated that she should be resuming her Eliquis in 5 days of discharge. -Outpatient follow-up with cardiology  Chronic kidney disease stage IIIb -Hold Lasix today.  Repeat a.m. creatinine.  Baseline creatinine around 2  Chronic diastolic CHF -Currently compensated.  Strict input output.  Daily weights.  Fluid restriction.  Hold Lasix and irbesartan for today because of slight elevated creatinine.  Anxiety -continue Lexapro  Hyperlipidemia -Continue statin  Anemia of chronic disease -From chronic kidney disease.  Hemoglobin stable.  Monitor    Thrombocytopenia -Questionable cause.  Monitor  Prolonged QT -QT 520 on presentation.  Repeat a.m. EKG.  Check a.m. potassium and magnesium.  DVT prophylaxis: Eliquis Code Status: Full Family Communication: Husband at bedside Disposition Plan: Home in 1 to 2 days if clinically improves Consults called: None Admission status: Observation/telemetry  Severity of  Illness: The appropriate patient status for this patient is OBSERVATION. Observation status is judged to be reasonable and necessary in order to provide the required intensity of service to ensure the patient's safety. The patient's presenting symptoms, physical exam findings, and initial radiographic and laboratory data in the context of their  medical condition is felt to place them at decreased risk for further clinical deterioration. Furthermore, it is anticipated that the patient will be medically stable for discharge from the hospital within 2 midnights of admission. The following factors support the patient status of observation.   " The patient's presenting symptoms include shortness of breath. " The physical exam findings include scattered crackles. " The initial radiographic and laboratory data are left-sided pneumonia.      Aline August MD [facility]  12/25/2019, 3:16 PM

## 2019-12-25 NOTE — ED Notes (Signed)
Pt O2 level started at 91 in bed, after walking past room 1 and back pt. O2 level was at 85. Once in bed O2 returned to 90 .

## 2019-12-25 NOTE — ED Triage Notes (Signed)
Pt reports since last week having SOB with exertion. Pt has cough started same time with some greenish-brown phlegm.

## 2019-12-25 NOTE — ED Provider Notes (Signed)
[facility] DEPT Provider Note   CSN: [id number] Arrival date & time: 12/25/19  1047     History Chief Complaint  Patient presents with  . Shortness of Breath    Jennifer Foster is a 74 y.o. female.  HPI Patient presents with shortness of breath.  Has had for around the last week.  Has had a cough with some greenish-brown sputum.  Shortness of breath is worse with walking.  Does have some mild chest tightness with the episodes.  No fevers.  No abdominal pain.  No swelling.  States she is due to get her second Covid shot tomorrow.  No known sick contacts.    Past Medical History:  Diagnosis Date  . Abnormal stress test 12/03/2017  . Acute blood loss anemia 06/26/2015  . Acute respiratory failure with hypoxia ([place]) 02/21/2018  . Aortic atherosclerosis ([place]) 01/01/2018   CT 04/2017  . Asthma   . Atrial fibrillation ([place]) 10/13/2017   Newly diagnosed in Jan 2019 // Apixaban for anticoag // Apixaban held in 2019 for anemia but resumed; Hgb stable since restarting  . CAD (coronary artery disease) 11/27/2017   Nuc stress 3/19: anterior ischemia, ?inf scar, EF 50, intermediate risk // LHC 4/19: LAD mild dz, pLCx 35, OM2 30, mRCA 60, dRCA 65, LVEDP 15-20  . CAP (community acquired pneumonia) 02/21/2018  . Chronic diastolic CHF (congestive heart failure) ([place]) 03/23/2018   Echo 2/19: Moderate LVH, EF 95-18, grade 2 diastolic dysfunction, trivial MR, moderate to severe LAE, PASP 30  . Chronic kidney disease (CKD), stage III (moderate) 06/27/2015  . DIVERTICULAR BLEEDING, HX OF 10/14/2007   Colonoscopy 2008 showed diverticulosis.   . Dizziness 11/25/2016  . GERD (gastroesophageal reflux disease)   . Gout   . History of anemia   . History of asthma   . Hyperlipemia   . Hypertension   . Joint pain   . Lipoma   . Proteinuria 01/11/2016  . Thoracic aortic aneurysm 04/03/2017   CT 8/18: ascending thoracic aorta 4.1 cm // unable to do CTA due to CKD // Chest MRA  05/2019: Ascending thoracic aorta 40 mm    Patient Active Problem List   Diagnosis Date Noted  . Edema of left foot 12/06/2019  . History of cocaine abuse ([place]) 10/03/2019  . Upper GI bleed 09/04/2019  . Ankle swelling 01/12/2019  . Chronic diastolic CHF (congestive heart failure) ([place]) 03/23/2018  . Aortic atherosclerosis ([place]) 01/01/2018  . CAD (coronary artery disease) 11/27/2017  . Atrial fibrillation ([place]) 10/13/2017  . Multinodular goiter 04/14/2017  . Adrenal adenoma 04/14/2017  . Thoracic aortic aneurysm without rupture ([place]) 04/03/2017  . Anxiety and depression 12/25/2015  . Chronic kidney disease, stage 3 ([place]) 06/27/2015  . Essential hypertension   . Gout 02/28/2014  . Lipoma 03/19/2011  . Hyperlipidemia 01/01/2011  . Chronic anemia 11/23/2009  . Tobacco abuse 09/03/2009  . Generalized anxiety disorder 08/13/2009    Past Surgical History:  Procedure Laterality Date  . ABDOMINAL HYSTERECTOMY  1981   partial, per pt history  . BIOPSY  09/05/2019   Procedure: BIOPSY;  Surgeon: Ronnette Juniper, MD;  Location: [facility];  Service: Gastroenterology;;  . COLONOSCOPY N/A 12/08/2013   Procedure: COLONOSCOPY;  Surgeon: [name], MD;  Location: [facility];  Service: Endoscopy;  Laterality: N/A;  . COLONOSCOPY N/A 06/28/2015   Procedure: COLONOSCOPY;  Surgeon: Clarene Essex, MD;  Location: [facility] ENDOSCOPY;  Service: Endoscopy;  Laterality: N/A;  . ESOPHAGOGASTRODUODENOSCOPY N/A 12/08/2013  Procedure: ESOPHAGOGASTRODUODENOSCOPY (EGD);  Surgeon: [name], MD;  Location: [facility] ENDOSCOPY;  Service: Endoscopy;  Laterality: N/A;  . ESOPHAGOGASTRODUODENOSCOPY (EGD) WITH PROPOFOL N/A 09/05/2019   Procedure: ESOPHAGOGASTRODUODENOSCOPY (EGD) WITH PROPOFOL;  Surgeon: Ronnette Juniper, MD;  Location: [facility] ENDOSCOPY;  Service: Gastroenterology;  Laterality: N/A;  . GIVENS CAPSULE STUDY N/A 12/08/2013   Procedure: GIVENS CAPSULE STUDY;  Surgeon: [name], MD;  Location: [facility];  Service:  Endoscopy;  Laterality: N/A;  . LEFT HEART CATH AND CORONARY ANGIOGRAPHY N/A 12/03/2017   Procedure: LEFT HEART CATH AND CORONARY ANGIOGRAPHY;  Surgeon: Nelva Bush, MD;  Location: [facility];  Service: Cardiovascular;  Laterality: N/A;  . LIPOMA EXCISION  01/28/11   neck     OB History   No obstetric history on file.     Family History  Problem Relation Age of Onset  . Diabetes type II Other   . Kidney failure Other   . Kidney failure Son     Social History   Tobacco Use  . Smoking status: Former Smoker    Packs/day: 0.25    Types: Cigarettes  . Smokeless tobacco: Never Used  Substance Use Topics  . Alcohol use: Yes    Alcohol/week: 0.0 standard drinks    Comment: occasionally  . Drug use: No    Home Medications Prior to Admission medications   Medication Sig Start Date End Date Taking? Authorizing Provider  albuterol (VENTOLIN HFA) 108 (90 Base) MCG/ACT inhaler Inhale 2 puffs into the lungs every 6 (six) hours as needed for wheezing or shortness of breath. 11/14/19  Yes Eniola, Tawanna Solo T, MD  amLODipine (NORVASC) 5 MG tablet TAKE 1 TABLET(5 MG) BY MOUTH TWICE DAILY. Please make yearly appt with Dr. Acie Fredrickson for May for future refills. 1st attempt Patient taking differently: Take 5 mg by mouth in the morning and at bedtime.  12/07/19  Yes Nahser, Wonda Cheng, MD  Baclofen 5 MG TABS Take 1 tablet by mouth every 12 (twelve) hours as needed. 09/23/19  Yes [name], MD  calcitRIOL (ROCALTROL) 0.25 MCG capsule Take 0.25 mcg by mouth daily.  12/24/18  Yes [provider]  Cod Liver Oil 1000 MG CAPS Take 1,000 mg by mouth daily.    Yes [provider]  escitalopram (LEXAPRO) 10 MG tablet TAKE 1 TABLET(10 MG) BY MOUTH DAILY Patient taking differently: Take 10 mg by mouth daily. TAKE 1 TABLET(10 MG) BY MOUTH DAILY 08/31/19  Yes Eniola, Kehinde T, MD  furosemide (LASIX) 40 MG tablet TAKE 1/2 TABLET(20 MG) BY MOUTH DAILY Patient taking differently: Take  20 mg by mouth daily.  09/08/19  Yes End, Harrell Gave, MD  ipratropium-albuterol (DUONEB) 0.5-2.5 (3) MG/3ML SOLN Take 3 mLs by nebulization every 4 (four) hours as needed. Patient taking differently: Take 3 mLs by nebulization every 4 (four) hours as needed (wheezing/shortness of breath).  08/27/18  Yes Raylene Everts, MD  irbesartan (AVAPRO) 150 MG tablet TAKE 1 TABLET BY MOUTH EVERY DAY Patient taking differently: Take 150 mg by mouth daily.  08/16/19  Yes Weaver, Scott T, PA-C  isosorbide mononitrate (IMDUR) 30 MG 24 hr tablet Take 1 tablet (30 mg total) by mouth daily. 01/10/19 01/10/20 Yes Weaver, Scott T, PA-C  metoprolol succinate (TOPROL-XL) 25 MG 24 hr tablet TAKE 1 TABLET BY MOUTH EVERY DAY(KEEP UPCOMING APPT IN MAY FOR FUTURE REFILLS) Patient taking differently: Take 25 mg by mouth daily.  04/12/19  Yes Richardson Dopp T, PA-C  Multiple Vitamin (MULTIVITAMIN WITH  MINERALS) TABS tablet Take 1 tablet by mouth daily.   Yes [provider]  nitroGLYCERIN (NITROSTAT) 0.4 MG SL tablet Place 1 tablet (0.4 mg total) under the tongue every 5 (five) minutes as needed for chest pain. If do not resolve after 1 tablet, go to ED 10/13/17  Yes Smiley Houseman, MD  pantoprazole (PROTONIX) 40 MG tablet Take 1 tablet (40 mg total) by mouth 2 (two) times daily before a meal. 09/06/19 12/25/19 Yes Adhikari, Tamsen Meek, MD  rosuvastatin (CRESTOR) 20 MG tablet Take 1 tablet (20 mg total) by mouth at bedtime. 11/08/19  Yes [name], MD  apixaban (ELIQUIS) 5 MG TABS tablet Take 1 tablet (5 mg total) by mouth 2 (two) times daily. Call to schedule Cardiologist appt. Thanks 11/07/19   Nahser, Wonda Cheng, MD  potassium chloride SA (KLOR-CON) 20 MEQ tablet Take 0.5 tablets (10 mEq total) by mouth daily. Patient not taking: Reported on 10/26/2019 07/15/19   Richardson Dopp T, PA-C  sucralfate (CARAFATE) 1 GM/10ML suspension Take 10 mLs (1 g total) by mouth 4 (four) times daily -  with meals and at bedtime. 09/06/19  10/06/19  Shelly Coss, MD    Allergies    Ace inhibitors, Lisinopril, and Tramadol  Review of Systems   Review of Systems  Constitutional: Negative for appetite change.  HENT: Negative for congestion.   Respiratory: Positive for cough and shortness of breath.   Cardiovascular: Negative for chest pain.  Gastrointestinal: Negative for abdominal pain.  Genitourinary: Negative for flank pain.  Skin: Negative for pallor.  Psychiatric/Behavioral: Negative for confusion.    Physical Exam Updated Vital Signs BP (!) 198/72   Pulse 67   Temp 97.9 F (36.6 C) (Oral)   Resp (!) 27   SpO2 96%   Physical Exam Vitals and nursing note reviewed.  HENT:     Head: Normocephalic.  Cardiovascular:     Rate and Rhythm: Rhythm irregular.  Pulmonary:     Comments: Mildly harsh breath sounds without focal rales or rhonchi. Chest:     Chest wall: No tenderness.  Musculoskeletal:     Cervical back: Neck supple.     Right lower leg: No tenderness. No edema.     Left lower leg: No tenderness. No edema.  Skin:    General: Skin is warm.     Capillary Refill: Capillary refill takes less than 2 seconds.  Neurological:     Mental Status: She is oriented to person, place, and time.     ED Results / Procedures / Treatments   Labs (all labs ordered are listed, but only abnormal results are displayed) Labs Reviewed  CBC - Abnormal; Notable for the following components:      Result Value   RBC 3.24 (*)    Hemoglobin 8.6 (*)    HCT 25.7 (*)    MCV 79.3 (*)    Platelets 141 (*)    All other components within normal limits  BASIC METABOLIC PANEL - Abnormal; Notable for the following components:   Potassium 2.8 (*)    Creatinine, Ser 2.36 (*)    GFR calc non Af Amer 20 (*)    GFR calc Af Amer 23 (*)    All other components within normal limits  TROPONIN I (HIGH SENSITIVITY) - Abnormal; Notable for the following components:   Troponin I (High Sensitivity) 30 (*)    All other components  within normal limits  SARS CORONAVIRUS 2 (TAT 6-24 HRS)  LACTIC ACID, PLASMA  LACTIC ACID, PLASMA  TROPONIN I (HIGH SENSITIVITY)    EKG EKG Interpretation  Date/Time:  Sunday December 25 2019 11:05:11 EDT Ventricular Rate:  55 PR Interval:    QRS Duration: 104 QT Interval:  543 QTC Calculation: 520 R Axis:   -13 Text Interpretation: Atrial fibrillation LVH with secondary repolarization abnormality Prolonged QT interval 12 Lead; [place] No significant change since last tracing Confirmed by Davonna Belling [phone]) on 12/25/2019 12:59:02 PM   Radiology DG Chest 2 View  Result Date: 12/25/2019 CLINICAL DATA:  Shortness of breath. EXAM: CHEST - 2 VIEW COMPARISON:  February 24, 2018 FINDINGS: No pneumothorax. Stable cardiomegaly. The hila and mediastinum are normal. The right lung is clear. Mild infiltrate in the left upper lung. No other acute abnormalities. Suggested tiny effusions. IMPRESSION: 1. Mild opacity in the left upper lung worrisome for pneumonia given history. Recommend short-term follow-up to ensure resolution. 2. Small pleural effusions.  Cardiomegaly. Electronically Signed   By: Dorise Bullion III M.D   On: 12/25/2019 12:01    Procedures Procedures (including critical care time)  Medications Ordered in ED Medications - No data to display  ED Course  I have reviewed the triage vital signs and the nursing notes.  Pertinent labs & imaging results that were available during my care of the patient were reviewed by me and considered in my medical decision making (see chart for details).    MDM Rules/Calculators/A&P                      Patient brought in with shortness of breath.  Has had a cough.  Has had symptoms for around 3 days.  Is had first Covid shot due for second 1 tomorrow.  Also some dull chest pain worse with walking.  EKG is stable.  Does show atrial fibrillation the patient states she did not know she has but has had a history of permanent A. Fib. Chadsvasc  of 5.  Patient had a GI bleed that stopped Eliquis briefly but reviewing notes it looks like she was post to start it back up and she never started back up.  Patient ambulated and had sats went down to 85%.  Troponin mildly elevated at 30 but this could be baseline or secondary to her pneumonia.  Has not had her blood pressure medicines today either.  With the hypoxia with ambulation will admit to hospital.  Covid test has been sent. Final Clinical Impression(s) / ED Diagnoses Final diagnoses:  Community acquired pneumonia, unspecified laterality    Rx / DC Orders ED Discharge Orders    None       Davonna Belling, MD 12/25/19 1448

## 2019-12-25 NOTE — ED Notes (Signed)
A set of blood cultures collected and sent to lab.

## 2019-12-25 NOTE — Telephone Encounter (Signed)
I was notified of patient's admission via Epic. I called to check on her and her husband picked up.  She is being admitted for Pneumonia. She and her husband were initially scheduled to see me in the clinic tomorrow. However, he wanted me to cancel both appointment. He will call to reschedule once Trenity is discharged from the hospital.  Appointment canceled for both of them. I appreciate the care provided to her by the [facility].

## 2019-12-25 NOTE — Progress Notes (Signed)
Writer paged Dr. Starla Link questioning elevated BP, RR, elevated Troponin, low Potassium and was told to administer Imdur PO, PO potassium, "which Dr. Starla Link stated she should have received potassium in the ED" Will follow written orders for PO Imdur and PO Potassium.   Will closely monitor pt as she is in the [facility]WS.

## 2019-12-25 NOTE — Progress Notes (Signed)
Pt is in the [facility] MEWS d/t elevated BP: 192/87 and elevated RR: 28. Charge RN notified. MD notified and no new orders were placed at this time. RN advised to administer scheduled Imdur and place pt on Yellow MEWS. Will continue to monitor closely

## 2019-12-26 ENCOUNTER — Ambulatory Visit: Payer: Medicare HMO

## 2019-12-26 ENCOUNTER — Ambulatory Visit: Payer: Medicare HMO | Admitting: Family Medicine

## 2019-12-26 ENCOUNTER — Observation Stay (HOSPITAL_BASED_OUTPATIENT_CLINIC_OR_DEPARTMENT_OTHER): Payer: Medicare HMO

## 2019-12-26 DIAGNOSIS — I340 Nonrheumatic mitral (valve) insufficiency: Secondary | ICD-10-CM | POA: Diagnosis not present

## 2019-12-26 DIAGNOSIS — N1832 Chronic kidney disease, stage 3b: Secondary | ICD-10-CM | POA: Diagnosis not present

## 2019-12-26 DIAGNOSIS — F411 Generalized anxiety disorder: Secondary | ICD-10-CM | POA: Diagnosis not present

## 2019-12-26 DIAGNOSIS — I361 Nonrheumatic tricuspid (valve) insufficiency: Secondary | ICD-10-CM

## 2019-12-26 DIAGNOSIS — I5032 Chronic diastolic (congestive) heart failure: Secondary | ICD-10-CM | POA: Diagnosis not present

## 2019-12-26 DIAGNOSIS — E785 Hyperlipidemia, unspecified: Secondary | ICD-10-CM | POA: Diagnosis not present

## 2019-12-26 DIAGNOSIS — E876 Hypokalemia: Secondary | ICD-10-CM | POA: Diagnosis not present

## 2019-12-26 DIAGNOSIS — Z20822 Contact with and (suspected) exposure to covid-19: Secondary | ICD-10-CM | POA: Diagnosis not present

## 2019-12-26 DIAGNOSIS — I4821 Permanent atrial fibrillation: Secondary | ICD-10-CM | POA: Diagnosis not present

## 2019-12-26 DIAGNOSIS — J189 Pneumonia, unspecified organism: Secondary | ICD-10-CM | POA: Diagnosis not present

## 2019-12-26 DIAGNOSIS — I130 Hypertensive heart and chronic kidney disease with heart failure and stage 1 through stage 4 chronic kidney disease, or unspecified chronic kidney disease: Secondary | ICD-10-CM | POA: Diagnosis not present

## 2019-12-26 LAB — ECHOCARDIOGRAM COMPLETE

## 2019-12-26 LAB — COMPREHENSIVE METABOLIC PANEL
ALT: 11 U/L (ref 0–44)
AST: 15 U/L (ref 15–41)
Albumin: 2.7 g/dL — ABNORMAL LOW (ref 3.5–5.0)
Alkaline Phosphatase: 56 U/L (ref 38–126)
Anion gap: 10 (ref 5–15)
BUN: 13 mg/dL (ref 8–23)
CO2: 24 mmol/L (ref 22–32)
Calcium: 8.7 mg/dL — ABNORMAL LOW (ref 8.9–10.3)
Chloride: 108 mmol/L (ref 98–111)
Creatinine, Ser: 2.23 mg/dL — ABNORMAL HIGH (ref 0.44–1.00)
GFR calc Af Amer: 25 mL/min — ABNORMAL LOW (ref >60)
GFR calc non Af Amer: 21 mL/min — ABNORMAL LOW (ref >60)
Glucose, Bld: 81 mg/dL (ref 70–99)
Potassium: 3.4 mmol/L — ABNORMAL LOW (ref 3.5–5.1)
Sodium: 142 mmol/L (ref 135–145)
Total Bilirubin: 1.5 mg/dL — ABNORMAL HIGH (ref 0.3–1.2)
Total Protein: 5.7 g/dL — ABNORMAL LOW (ref 6.5–8.1)

## 2019-12-26 LAB — CBC
HCT: 24.5 % — ABNORMAL LOW (ref 36.0–46.0)
Hemoglobin: 8 g/dL — ABNORMAL LOW (ref 12.0–15.0)
MCH: 26.4 pg (ref 26.0–34.0)
MCHC: 32.7 g/dL (ref 30.0–36.0)
MCV: 80.9 fL (ref 80.0–100.0)
Platelets: 123 10*3/uL — ABNORMAL LOW (ref 150–400)
RBC: 3.03 MIL/uL — ABNORMAL LOW (ref 3.87–5.11)
RDW: 15.6 % — ABNORMAL HIGH (ref 11.5–15.5)
WBC: 4.9 10*3/uL (ref 4.0–10.5)
nRBC: 0 % (ref 0.0–0.2)

## 2019-12-26 LAB — LACTIC ACID, PLASMA: Lactic Acid, Venous: 0.8 mmol/L (ref 0.5–1.9)

## 2019-12-26 LAB — SARS CORONAVIRUS 2 (TAT 6-24 HRS): SARS Coronavirus 2: NEGATIVE

## 2019-12-26 MED ORDER — POTASSIUM CHLORIDE CRYS ER 20 MEQ PO TBCR
20.0000 meq | EXTENDED_RELEASE_TABLET | Freq: Once | ORAL | Status: AC
  Administered 2019-12-26: 20 meq via ORAL
  Filled 2019-12-26: qty 1

## 2019-12-26 MED ORDER — FUROSEMIDE 10 MG/ML IJ SOLN
40.0000 mg | Freq: Once | INTRAMUSCULAR | Status: AC
  Administered 2019-12-26: 40 mg via INTRAVENOUS
  Filled 2019-12-26: qty 4

## 2019-12-26 MED ORDER — GUAIFENESIN ER 600 MG PO TB12
600.0000 mg | ORAL_TABLET | Freq: Two times a day (BID) | ORAL | Status: DC
  Administered 2019-12-26 – 2019-12-27 (×3): 600 mg via ORAL
  Filled 2019-12-26 (×3): qty 1

## 2019-12-26 MED ORDER — IPRATROPIUM-ALBUTEROL 0.5-2.5 (3) MG/3ML IN SOLN
3.0000 mL | Freq: Four times a day (QID) | RESPIRATORY_TRACT | Status: DC | PRN
  Administered 2019-12-26: 3 mL via RESPIRATORY_TRACT
  Filled 2019-12-26: qty 3

## 2019-12-26 MED ORDER — HYDRALAZINE HCL 20 MG/ML IJ SOLN
5.0000 mg | Freq: Once | INTRAMUSCULAR | Status: AC
  Administered 2019-12-26: 19:00:00 5 mg via INTRAVENOUS
  Filled 2019-12-26: qty 1

## 2019-12-26 MED ORDER — HYDRALAZINE HCL 25 MG PO TABS
25.0000 mg | ORAL_TABLET | Freq: Three times a day (TID) | ORAL | Status: DC
  Administered 2019-12-26 – 2019-12-27 (×4): 25 mg via ORAL
  Filled 2019-12-26 (×4): qty 1

## 2019-12-26 NOTE — Evaluation (Signed)
Physical Therapy Evaluation Patient Details Name: Jennifer Foster MRN: [id number] DOB: Oct 13, 1945 Today's Date: 12/26/2019   History of Present Illness  74 y.o. female with medical history significant of hypertension, CAD, chronic diastolic CHF, permanent A. fib for which she is supposed to be on Eliquis but has not been on it for few months now, upper GI bleeding in January 2021 requiring EGD which showed nonbleeding gastric ulcers and she was supposed to resume Eliquis after 5 days of discharge, chronic renal disease stage IIIb, hyperlipidemia, anxiety presented with shortness of breath for the last week.  Pt diagnosed with CAP.  Clinical Impression  Pt admitted with above diagnosis.  Pt currently with functional limitations due to the deficits listed below (see PT Problem List). Pt will benefit from skilled PT to increase their independence and safety with mobility to allow discharge to the venue listed below.  Pt ambulated in hallway and reports feeling better overall.  Pt hopeful for d/c home soon.     Follow Up Recommendations No PT follow up    Equipment Recommendations  None recommended by PT    Recommendations for Other Services       Precautions / Restrictions Precautions Precautions: Fall      Mobility  Bed Mobility Overal bed mobility: Modified Independent                Transfers Overall transfer level: Needs assistance Equipment used: None Transfers: Sit to/from Stand Sit to Stand: Min guard         General transfer comment: min/guard for safety  Ambulation/Gait Ambulation/Gait assistance: Min guard Gait Distance (Feet): 360 Feet Assistive device: None Gait Pattern/deviations: Step-through pattern;Decreased stride length     General Gait Details: pt pushed IV pole however not required for support, HR 73 bpm pre and post ambulation, pt reports fatigue, no dyspnea  Stairs            Wheelchair Mobility    Modified Rankin (Stroke Patients  Only)       Balance                                             Pertinent Vitals/Pain Pain Assessment: No/denies pain    Home Living Family/patient expects to be discharged to:: Private residence Living Arrangements: Spouse/significant other   Type of Home: House Home Access: Level entry     Home Layout: One level Home Equipment: None      Prior Function Level of Independence: Independent               Hand Dominance        Extremity/Trunk Assessment        Lower Extremity Assessment Lower Extremity Assessment: Overall WFL for tasks assessed    Cervical / Trunk Assessment Cervical / Trunk Assessment: Normal  Communication   Communication: No difficulties  Cognition Arousal/Alertness: Awake/alert Behavior During Therapy: WFL for tasks assessed/performed Overall Cognitive Status: Within Functional Limits for tasks assessed                                        General Comments      Exercises     Assessment/Plan    PT Assessment Patient needs continued PT services  PT Problem List Decreased mobility;Decreased strength;Decreased activity tolerance  PT Treatment Interventions Gait training;DME instruction;Therapeutic activities;Therapeutic exercise;Functional mobility training;Patient/family education    PT Goals (Current goals can be found in the Care Plan section)  Acute Rehab PT Goals Patient Stated Goal: home to her flower beds PT Goal Formulation: With patient Time For Goal Achievement: 01/09/20 Potential to Achieve Goals: Good    Frequency Min 3X/week   Barriers to discharge        Co-evaluation               AM-PAC PT "6 Clicks" Mobility  Outcome Measure Help needed turning from your back to your side while in a flat bed without using bedrails?: None Help needed moving from lying on your back to sitting on the side of a flat bed without using bedrails?: None Help needed moving to  and from a bed to a chair (including a wheelchair)?: A Little Help needed standing up from a chair using your arms (e.g., wheelchair or bedside chair)?: A Little Help needed to walk in hospital room?: A Little Help needed climbing 3-5 steps with a railing? : A Little 6 Click Score: 20    End of Session   Activity Tolerance: Patient tolerated treatment well Patient left: in chair;with call bell/phone within reach;with chair alarm set;with family/visitor present Nurse Communication: Mobility status PT Visit Diagnosis: Other abnormalities of gait and mobility (R26.89)    Time: 1037-1050 PT Time Calculation (min) (ACUTE ONLY): 13 min   Charges:   PT Evaluation $PT Eval Low Complexity: 1 Low         Kati PT, DPT Acute Rehabilitation Services Office: [phone]  Trena Platt 12/26/2019, 12:41 PM

## 2019-12-26 NOTE — Progress Notes (Signed)
  Echocardiogram 2D Echocardiogram has been performed.  Xandra Laramee A Ahri Olson 12/26/2019, 9:40 AM

## 2019-12-26 NOTE — Progress Notes (Addendum)
PROGRESS NOTE    Jennifer Foster  UYQ:[id number] DOB: 1946/03/05 DOA: 12/25/2019 PCP: Doreene Eland, MD   Brief Narrative: 74 year old with past medical history significant for hypertension, CAD, chronic diastolic heart failure, permanent A. fib, for which she was supposed to be on Eliquis however not been on it for a few months now after she had a history of GI bleed.  She was supposed to resume her Eliquis 5 days after discharge.  CKD stage IIIb, hyperlipidemia who presents complaining of shortness of breath for the last week.  She reports cough, greenish-brown sputum, shortness of breath on ambulation.  Oxygen saturation room air 80%, chest x-ray suggestive of pneumonia.    Assessment & Plan:   Principal Problem:   Pneumonia Active Problems:   Essential hypertension   Chronic kidney disease, stage 3 (HCC)   Atrial fibrillation (HCC)   Chronic diastolic CHF (congestive heart failure) (HCC)   1-Community acquired pneumonia: Chest x-ray show mild opacities in the left upper lobe worrisome for pneumonia Agree to continue with ceftriaxone and a Zithromax Urine Legionella and streptococcal antigen COVID-19 negative Continue with oxygen supplementation.  2-Hypertension: Continue with Norvasc, will add hydralazine.  Continue with Imdur. Hold Lasix and irbesartan due to elevated cr.   3-A. Fib: Bradycardic, asymptomatic. Low dose metoprolol.  Resume Eliquis.  4-CKD stage IIIb: Creatinine 1.82--- 2. Continue to hold Lasix for now  5-Chronic diastolic heart failure: She reports weight loss.  Hold Lasix unable losartan for now  6-Anxiety: Continue with Lexapro 7-Hyperlipidemia: Continue with statins 8-Anemia chronic disease: Follow hemoglobin.  Stable 9-Thrombocytopenia: Mild stable 10-Prolonged QT; replete potassium.  QT down to 447 11-Mild elevation of troponin, abnormal echo: Check echo; ejection fraction 50%, left ventricular function low normal.  Left ventricle  demonstrate regional wall motion abnormality.  With hypokinesis of the left ventricular, basal mid lateral wall -We will consult cardiology   Addendum;  Patient complaining of dyspnea this evening. Crackles on lung exam. Came to evaluate patient.  -will give IV lasix.  -BP elevated will give Hydralazine.  -Apply 2 L oxygen.   Estimated body mass index is 24.2 kg/m as calculated from the following:   Height as of 12/06/19: 5\' 4"  (1.626 m).   Weight as of 12/06/19: 64 kg.   DVT prophylaxis: On Eliquis Code Status: Full code Family Communication: Discussed with patient Disposition Plan:  Patient is from: Home Anticipated d/c date: Home in 1 or 2 days Barriers to d/c or necessity for inpatient status: Remain in the hospital for treatment of pneumonia, shortness of breath and further evaluation for abnormal echocardiogram  Consultants:   Cardiology  Procedures:  Echo; ejection fraction 50%, left ventricular function low normal.  Left ventricle demonstrate regional wall motion abnormality.  With hypokinesis of the left ventricular, basal mid lateral wall    Antimicrobials:  Ceftriaxone and azithromycin  Subjective: She is alert, she report feeling a little bit better still coughing up some phlegm and having some shortness of breath denies chest pain.  She thinks she has been losing weight  Objective: Vitals:   12/26/19 0153 12/26/19 0555 12/26/19 1130 12/26/19 1438  BP: (!) 181/78 (!) 155/70 (!) 162/84 (!) 190/75  Pulse: 61 (!) 51 69 62  Resp: 18 17    Temp: 98.4 F (36.9 C) 98.4 F (36.9 C)  98.3 F (36.8 C)  TempSrc: Oral Oral  Oral  SpO2: 94% 96% 99% 95%    Intake/Output Summary (Last 24 hours) at 12/26/2019 1528 Last data filed  at 12/26/2019 0600 Gross per 24 hour  Intake 286.27 ml  Output 0 ml  Net 286.27 ml   There were no vitals filed for this visit.  Examination:  General exam: Appears calm and comfortable  Respiratory system: Clear to auscultation.  Respiratory effort normal. Cardiovascular system: S1 & S2 heard, RRR. No JVD, murmurs, rubs, gallops or clicks. No pedal edema. Gastrointestinal system: Abdomen is nondistended, soft and nontender. No organomegaly or masses felt. Normal bowel sounds heard. Central nervous system: Alert and oriented. No focal neurological deficits. Extremities: Symmetric 5 x 5 power. Skin: No rashes, lesions or ulcers    Data Reviewed: I have personally reviewed following labs and imaging studies  CBC: Recent Labs  Lab 12/25/19 1207 12/26/19 0553  WBC 4.8 4.9  HGB 8.6* 8.0*  HCT 25.7* 24.5*  MCV 79.3* 80.9  PLT 141* 123*   Basic Metabolic Panel: Recent Labs  Lab 12/25/19 1207 12/26/19 0553  NA 144 142  K 2.8* 3.4*  CL 106 108  CO2 26 24  GLUCOSE 84 81  BUN 14 13  CREATININE 2.36* 2.23*  CALCIUM 9.3 8.7*   GFR: CrCl cannot be calculated (Unknown ideal weight.). Liver Function Tests: Recent Labs  Lab 12/26/19 0553  AST 15  ALT 11  ALKPHOS 56  BILITOT 1.5*  PROT 5.7*  ALBUMIN 2.7*   No results for input(s): LIPASE, AMYLASE in the last 168 hours. No results for input(s): AMMONIA in the last 168 hours. Coagulation Profile: No results for input(s): INR, PROTIME in the last 168 hours. Cardiac Enzymes: No results for input(s): CKTOTAL, CKMB, CKMBINDEX, TROPONINI in the last 168 hours. BNP (last 3 results) No results for input(s): PROBNP in the last 8760 hours. HbA1C: No results for input(s): HGBA1C in the last 72 hours. CBG: No results for input(s): GLUCAP in the last 168 hours. Lipid Profile: No results for input(s): CHOL, HDL, LDLCALC, TRIG, CHOLHDL, LDLDIRECT in the last 72 hours. Thyroid Function Tests: No results for input(s): TSH, T4TOTAL, FREET4, T3FREE, THYROIDAB in the last 72 hours. Anemia Panel: No results for input(s): VITAMINB12, FOLATE, FERRITIN, TIBC, IRON, RETICCTPCT in the last 72 hours. Sepsis Labs: Recent Labs  Lab 12/25/19 1333 12/25/19 1915   LATICACIDVEN 0.7 0.8    Recent Results (from the past 240 hour(s))  SARS CORONAVIRUS 2 (TAT 6-24 HRS) Nasopharyngeal Nasopharyngeal Swab     Status: None   Collection Time: 12/25/19  1:34 PM   Specimen: Nasopharyngeal Swab  Result Value Ref Range Status   SARS Coronavirus 2 NEGATIVE NEGATIVE Final    Comment: (NOTE) SARS-CoV-2 target nucleic acids are NOT DETECTED. The SARS-CoV-2 RNA is generally detectable in upper and lower respiratory specimens during the acute phase of infection. Negative results do not preclude SARS-CoV-2 infection, do not rule out co-infections with other pathogens, and should not be used as the sole basis for treatment or other patient management decisions. Negative results must be combined with clinical observations, patient history, and epidemiological information. The expected result is Negative. Fact Sheet for Patients: HairSlick.no Fact Sheet for Healthcare Providers: quierodirigir.com This test is not yet approved or cleared by the [place] FDA and  has been authorized for detection and/or diagnosis of SARS-CoV-2 by FDA under an Emergency Use Authorization (EUA). This EUA will remain  in effect (meaning this test can be used) for the duration of the COVID-19 declaration under Section 56 4(b)(1) of the Act, 21 U.S.C. section 360bbb-3(b)(1), unless the authorization is terminated or revoked sooner. Performed at [facility]  [facility], [address], [place], [place] [address]          Radiology Studies: DG Chest 2 View  Result Date: 12/25/2019 CLINICAL DATA:  Shortness of breath. EXAM: CHEST - 2 VIEW COMPARISON:  February 24, 2018 FINDINGS: No pneumothorax. Stable cardiomegaly. The hila and mediastinum are normal. The right lung is clear. Mild infiltrate in the left upper lung. No other acute abnormalities. Suggested tiny effusions. IMPRESSION: 1. Mild opacity in the left upper lung worrisome  for pneumonia given history. Recommend short-term follow-up to ensure resolution. 2. Small pleural effusions.  Cardiomegaly. Electronically Signed   By: Gerome Sam III M.D   On: 12/25/2019 12:01   ECHOCARDIOGRAM COMPLETE  Result Date: 12/26/2019    ECHOCARDIOGRAM REPORT   Patient Name:   Jennifer Foster Date of Exam: 12/26/2019 Medical Rec #:  [id number]      Height:       64.0 in Accession #:    [id number]     Weight:       141.0 lb Date of Birth:  August 10, 1946      BSA:          1.686 m Patient Age:    73 years       BP:           155/70 mmHg Patient Gender: F              HR:           50 bpm. Exam Location:  Inpatient Procedure: 2D Echo Indications:    Abnormal ECG 794.31 / R94.31  History:        Patient has prior history of Echocardiogram examinations, most                 recent 10/22/2017. CAD, Arrythmias:Atrial Fibrillation; Risk                 Factors:Hypertension, Tobacco abuse and Dyslipidemia.  Sonographer:    Leeroy Bock Turrentine Referring Phys: [id number] Ascencion Stegner A Kinnedy Mongiello IMPRESSIONS  1. Left ventricular ejection fraction, by estimation, is 50%. The left ventricle has low normal function. The left ventricle demonstrates regional wall motion abnormalities (see scoring diagram/findings for description). There is moderate left ventricular hypertrophy. Left ventricular diastolic parameters are indeterminate. There is moderate hypokinesis of the left ventricular, basal-mid lateral wall.  2. Right ventricular systolic function is mildly reduced. The right ventricular size is normal. There is severely elevated pulmonary artery systolic pressure. The estimated right ventricular systolic pressure is 63.7 mmHg.  3. Left atrial size was severely dilated.  4. Right atrial size was severely dilated.  5. The mitral valve is normal in structure. Mild to moderate mitral valve regurgitation. No evidence of mitral stenosis.  6. Tricuspid valve regurgitation is mild to moderate.  7. The aortic valve is tricuspid. Aortic  valve regurgitation is trivial. No aortic stenosis is present.  8. The inferior vena cava is dilated in size with >50% respiratory variability, suggesting right atrial pressure of 8 mmHg. FINDINGS  Left Ventricle: Left ventricular ejection fraction, by estimation, is 50%. The left ventricle has low normal function. The left ventricle demonstrates regional wall motion abnormalities. Moderate hypokinesis of the left ventricular, basal-mid lateral wall. The left ventricular internal cavity size was normal in size. There is moderate left ventricular hypertrophy. Left ventricular diastolic parameters are indeterminate. Right Ventricle: The right ventricular size is normal. No increase in right ventricular wall thickness. Right ventricular systolic function is mildly reduced. There is severely  elevated pulmonary artery systolic pressure. The tricuspid regurgitant velocity is 3.73 m/s, and with an assumed right atrial pressure of 8 mmHg, the estimated right ventricular systolic pressure is 63.7 mmHg. Left Atrium: Left atrial size was severely dilated. Right Atrium: Right atrial size was severely dilated. Pericardium: A small pericardial effusion is present. The pericardial effusion is localized near the right atrium. Mitral Valve: The mitral valve is normal in structure. Normal mobility of the mitral valve leaflets. Mild to moderate mitral valve regurgitation. No evidence of mitral valve stenosis. Tricuspid Valve: The tricuspid valve is normal in structure. Tricuspid valve regurgitation is mild to moderate. No evidence of tricuspid stenosis. Aortic Valve: The aortic valve is tricuspid. Aortic valve regurgitation is trivial. No aortic stenosis is present. Pulmonic Valve: The pulmonic valve was normal in structure. Pulmonic valve regurgitation is not visualized. No evidence of pulmonic stenosis. Aorta: The aortic root is normal in size and structure. Venous: The inferior vena cava is dilated in size with greater than 50%  respiratory variability, suggesting right atrial pressure of 8 mmHg. IAS/Shunts: No atrial level shunt detected by color flow Doppler.  LEFT VENTRICLE PLAX 2D LVIDd:         4.94 cm  Diastology LVIDs:         3.33 cm  LV e' medial:   6.41 cm/s LV PW:         1.37 cm  LV E/e' medial: 21.2 LV IVS:        1.40 cm LVOT diam:     2.10 cm LV SV:         74 LV SV Index:   44 LVOT Area:     3.46 cm  RIGHT VENTRICLE RV S prime:     10.50 cm/s TAPSE (M-mode): 1.8 cm LEFT ATRIUM              Index        RIGHT ATRIUM           Index LA diam:        5.20 cm  3.08 cm/m   RA Area:     24.60 cm LA Vol (A2C):   168.0 ml 99.63 ml/m  RA Volume:   75.00 ml  44.48 ml/m LA Vol (A4C):   174.0 ml 103.19 ml/m LA Biplane Vol: 187.0 ml 110.89 ml/m  AORTIC VALVE LVOT Vmax:   98.80 cm/s LVOT Vmean:  69.200 cm/s LVOT VTI:    0.215 m  AORTA Ao Root diam: 3.30 cm MITRAL VALVE                TRICUSPID VALVE MV Area (PHT): 3.85 cm     TR Peak grad:   55.7 mmHg MV Decel Time: 197 msec     TR Vmax:        373.00 cm/s MV E velocity: 136.00 cm/s                             SHUNTS                             Systemic VTI:  0.22 m                             Systemic Diam: 2.10 cm Weston Brass MD Electronically signed by Weston Brass MD Signature Date/Time: 12/26/2019/1:23:27 PM    Final  Scheduled Meds: . amLODipine  5 mg Oral BID  . apixaban  5 mg Oral BID  . azithromycin  500 mg Oral Daily  . escitalopram  10 mg Oral Daily  . guaiFENesin  600 mg Oral BID  . hydrALAZINE  25 mg Oral Q8H  . isosorbide mononitrate  30 mg Oral Daily  . metoprolol succinate  25 mg Oral Daily  . pantoprazole  40 mg Oral BID AC  . rosuvastatin  20 mg Oral QHS  . sucralfate  1 g Oral TID WC & HS   Continuous Infusions: . cefTRIAXone (ROCEPHIN)  IV       LOS: 0 days    Time spent: 35 minutes.     Alba Cory, MD [facility]   If 7PM-7AM, please contact night-coverage [email or web address]  12/26/2019, 3:28 PM

## 2019-12-27 ENCOUNTER — Encounter (HOSPITAL_COMMUNITY): Payer: Self-pay | Admitting: Internal Medicine

## 2019-12-27 ENCOUNTER — Other Ambulatory Visit: Payer: Self-pay

## 2019-12-27 DIAGNOSIS — Z20822 Contact with and (suspected) exposure to covid-19: Secondary | ICD-10-CM | POA: Diagnosis not present

## 2019-12-27 DIAGNOSIS — F411 Generalized anxiety disorder: Secondary | ICD-10-CM | POA: Diagnosis not present

## 2019-12-27 DIAGNOSIS — I4821 Permanent atrial fibrillation: Secondary | ICD-10-CM | POA: Diagnosis not present

## 2019-12-27 DIAGNOSIS — R778 Other specified abnormalities of plasma proteins: Secondary | ICD-10-CM | POA: Diagnosis not present

## 2019-12-27 DIAGNOSIS — E785 Hyperlipidemia, unspecified: Secondary | ICD-10-CM | POA: Diagnosis not present

## 2019-12-27 DIAGNOSIS — J189 Pneumonia, unspecified organism: Secondary | ICD-10-CM | POA: Diagnosis not present

## 2019-12-27 DIAGNOSIS — I5032 Chronic diastolic (congestive) heart failure: Secondary | ICD-10-CM | POA: Diagnosis not present

## 2019-12-27 DIAGNOSIS — N1832 Chronic kidney disease, stage 3b: Secondary | ICD-10-CM | POA: Diagnosis not present

## 2019-12-27 DIAGNOSIS — E876 Hypokalemia: Secondary | ICD-10-CM | POA: Diagnosis not present

## 2019-12-27 DIAGNOSIS — I130 Hypertensive heart and chronic kidney disease with heart failure and stage 1 through stage 4 chronic kidney disease, or unspecified chronic kidney disease: Secondary | ICD-10-CM | POA: Diagnosis not present

## 2019-12-27 LAB — CBC
HCT: 24.9 % — ABNORMAL LOW (ref 36.0–46.0)
Hemoglobin: 8.1 g/dL — ABNORMAL LOW (ref 12.0–15.0)
MCH: 26.4 pg (ref 26.0–34.0)
MCHC: 32.5 g/dL (ref 30.0–36.0)
MCV: 81.1 fL (ref 80.0–100.0)
Platelets: 123 10*3/uL — ABNORMAL LOW (ref 150–400)
RBC: 3.07 MIL/uL — ABNORMAL LOW (ref 3.87–5.11)
RDW: 15.9 % — ABNORMAL HIGH (ref 11.5–15.5)
WBC: 4.7 10*3/uL (ref 4.0–10.5)
nRBC: 0 % (ref 0.0–0.2)

## 2019-12-27 LAB — BASIC METABOLIC PANEL
Anion gap: 8 (ref 5–15)
BUN: 11 mg/dL (ref 8–23)
CO2: 24 mmol/L (ref 22–32)
Calcium: 9 mg/dL (ref 8.9–10.3)
Chloride: 110 mmol/L (ref 98–111)
Creatinine, Ser: 2.28 mg/dL — ABNORMAL HIGH (ref 0.44–1.00)
GFR calc Af Amer: 24 mL/min — ABNORMAL LOW (ref >60)
GFR calc non Af Amer: 21 mL/min — ABNORMAL LOW (ref >60)
Glucose, Bld: 84 mg/dL (ref 70–99)
Potassium: 3.2 mmol/L — ABNORMAL LOW (ref 3.5–5.1)
Sodium: 142 mmol/L (ref 135–145)

## 2019-12-27 MED ORDER — AZITHROMYCIN 250 MG PO TABS: 250.0000 mg | ORAL_TABLET | Freq: Every day | ORAL | 0 refills | Status: AC

## 2019-12-27 MED ORDER — CEPHALEXIN 500 MG PO CAPS: 500.0000 mg | ORAL_CAPSULE | Freq: Two times a day (BID) | ORAL | 0 refills | Status: AC

## 2019-12-27 MED ORDER — GUAIFENESIN ER 600 MG PO TB12: 600.0000 mg | ORAL_TABLET | Freq: Two times a day (BID) | ORAL | 0 refills | Status: DC

## 2019-12-27 MED ORDER — ALBUTEROL SULFATE HFA 108 (90 BASE) MCG/ACT IN AERS: 2.0000 | INHALATION_SPRAY | Freq: Four times a day (QID) | RESPIRATORY_TRACT | 3 refills | Status: AC | PRN

## 2019-12-27 MED ORDER — FUROSEMIDE 20 MG PO TABS
20.0000 mg | ORAL_TABLET | Freq: Every day | ORAL | Status: DC
  Administered 2019-12-27: 20 mg via ORAL
  Filled 2019-12-27: qty 1

## 2019-12-27 MED ORDER — POTASSIUM CHLORIDE CRYS ER 20 MEQ PO TBCR
40.0000 meq | EXTENDED_RELEASE_TABLET | Freq: Once | ORAL | Status: AC
  Administered 2019-12-27: 40 meq via ORAL
  Filled 2019-12-27: qty 2

## 2019-12-27 MED ORDER — HYDRALAZINE HCL 25 MG PO TABS: 25.0000 mg | ORAL_TABLET | Freq: Three times a day (TID) | ORAL | 0 refills | Status: DC

## 2019-12-27 NOTE — Discharge Instructions (Signed)
Please follow up with your kidney Doctors for follow up of kidney function and determine if you can take again Avapro.

## 2019-12-27 NOTE — Progress Notes (Signed)
Discharge instructions reviewed with patient utilizing teach back method no questions at this time. Patient discharged to home with husband via private vehicle.

## 2019-12-27 NOTE — Discharge Summary (Signed)
Physician Discharge Summary  Jennifer Foster WUX:[id number] DOB: 1946-06-18 DOA: 12/25/2019  PCP: Jennifer Eland, MD  Admit date: 12/25/2019 Discharge date: 12/27/2019  Admitted From: Home Disposition:  Home  Recommendations for Outpatient Follow-up:  1. Follow up with PCP in 1-2 weeks 2. Please obtain BMP/CBC in one week 3. Follow up with cardiology after resolution of PNA, Patient will need or repeat ECHO or Cath to further evaluate wall motion abnormalities. 4. Follow up renal function to consider resuming Irbesartan.  5. Follow HR to consider resuming BB  Home Health:none  Discharge Condition: Stable.  CODE STATUS: Full Code Diet recommendation: Heart Healthy  Brief/Interim Summary: 74 year old with past medical history significant for hypertension, CAD, chronic diastolic heart failure, permanent A. fib, for which she was supposed to be on Eliquis however not been on it for a few months now after she had a history of GI bleed.  She was supposed to resume her Eliquis 5 days after discharge.  CKD stage IIIb, hyperlipidemia who presents complaining of shortness of breath for the last week.  She reports cough, greenish-brown sputum, shortness of breath on ambulation.  Oxygen saturation room air 80%, chest x-ray suggestive of pneumonia.    1-Community acquired pneumonia: Chest x-ray show mild opacities in the left upper lobe worrisome for pneumonia Treated with ceftriaxone and a Zithromax. Discharge on Keflex and azithromycin.  COVID-19 negative Continue with oxygen supplementation. dyspnea improved and stable. Discharge home  2-Hypertension: Continue with Norvasc, will add hydralazine.  Continue with Imdur. Hold rbesartan due to elevated cr.   3-A. Fib: Bradycardic, asymptomatic. Metoprolol held.  Resume Eliquis.  4-CKD stage IIIb: Creatinine 1.82--- 2. Resume home dose lasix.  5-Chronic diastolic heart failure: She reports weight loss. Resume lasix. Received a dose  IV for dyspnea.   6-Anxiety: Continue with Lexapro 7-Hyperlipidemia: Continue with statins 8-Anemia chronic disease: Follow hemoglobin.  Stable 9-Thrombocytopenia: Mild stable 10-Prolonged QT; replete potassium.  QT down to 447 11-Mild elevation of troponin, abnormal echo:  echo; ejection fraction 50%, left ventricular function low normal.  Left ventricle demonstrate regional wall motion abnormality.  With hypokinesis of the left ventricular, basal mid lateral wall -Cardiology recommended out patient follow up with repeat ECHO or Cath   Discharge Diagnoses:  Principal Problem:   Pneumonia Active Problems:   Essential hypertension   Chronic kidney disease, stage 3 (HCC)   Atrial fibrillation (HCC)   Chronic diastolic CHF (congestive heart failure) (HCC)   Elevated troponin    Discharge Instructions  Discharge Instructions    Diet - low sodium heart healthy   Complete by: As directed    Increase activity slowly   Complete by: As directed      Allergies as of 12/27/2019      Reactions   Ace Inhibitors Swelling, Other (See Comments)   Eyes swell   Lisinopril Swelling, Other (See Comments)   Swollen tongue    Tramadol Itching      Medication List    STOP taking these medications   irbesartan 150 MG tablet Commonly known as: AVAPRO   metoprolol succinate 25 MG 24 hr tablet Commonly known as: TOPROL-XL     TAKE these medications   albuterol 108 (90 Base) MCG/ACT inhaler Commonly known as: VENTOLIN HFA Inhale 2 puffs into the lungs every 6 (six) hours as needed for wheezing or shortness of breath.   amLODipine 5 MG tablet Commonly known as: NORVASC TAKE 1 TABLET(5 MG) BY MOUTH TWICE DAILY. Please make yearly appt with Dr.  Nahser for May for future refills. 1st attempt What changed:   how much to take  how to take this  when to take this  additional instructions Notes to patient: 12/27/2019 @ 10:00 pm    apixaban 5 MG Tabs tablet Commonly known as:  Eliquis Take 1 tablet (5 mg total) by mouth 2 (two) times daily. Call to schedule Cardiologist appt. Thanks Notes to patient: 12/27/2019 @ 10:00 pm    azithromycin 250 MG tablet Commonly known as: ZITHROMAX Take 1 tablet (250 mg total) by mouth daily for 3 days. Notes to patient: 12/28/2019 @ 10:00 am    Baclofen 5 MG Tabs Take 1 tablet by mouth every 12 (twelve) hours as needed.   calcitRIOL 0.25 MCG capsule Commonly known as: ROCALTROL Take 0.25 mcg by mouth daily. Notes to patient: 12/27/18   cephALEXin 500 MG capsule Commonly known as: KEFLEX Take 1 capsule (500 mg total) by mouth 2 (two) times daily for 10 days. Notes to patient: 12/28/2019 @ 0800    Cod Liver Oil 1000 MG Caps Take 1,000 mg by mouth daily. Notes to patient: 12/27/2019    escitalopram 10 MG tablet Commonly known as: LEXAPRO TAKE 1 TABLET(10 MG) BY MOUTH DAILY What changed:   how much to take  how to take this  when to take this Notes to patient: 12/28/2019 @ 10:00 am    furosemide 40 MG tablet Commonly known as: LASIX TAKE 1/2 TABLET(20 MG) BY MOUTH DAILY What changed: See the new instructions. Notes to patient: 12/28/2019 @ 2:00 pm    guaiFENesin 600 MG 12 hr tablet Commonly known as: MUCINEX Take 1 tablet (600 mg total) by mouth 2 (two) times daily. Notes to patient: 12/27/2019 @ 10:00 pm    hydrALAZINE 25 MG tablet Commonly known as: APRESOLINE Take 1 tablet (25 mg total) by mouth every 8 (eight) hours. Notes to patient: 12/27/2019 @ 10:00 pm    ipratropium-albuterol 0.5-2.5 (3) MG/3ML Soln Commonly known as: DUONEB Take 3 mLs by nebulization every 4 (four) hours as needed. What changed: reasons to take this   isosorbide mononitrate 30 MG 24 hr tablet Commonly known as: IMDUR Take 1 tablet (30 mg total) by mouth daily. Notes to patient: 12/28/2019 @ 10:00 am    multivitamin with minerals Tabs tablet Take 1 tablet by mouth daily. Notes to patient: 12/27/2019    nitroGLYCERIN 0.4 MG SL  tablet Commonly known as: NITROSTAT Place 1 tablet (0.4 mg total) under the tongue every 5 (five) minutes as needed for chest pain. If do not resolve after 1 tablet, go to ED   pantoprazole 40 MG tablet Commonly known as: PROTONIX Take 1 tablet (40 mg total) by mouth 2 (two) times daily before a meal. Notes to patient: 12/27/2019 @ 5:00 pm    potassium chloride SA 20 MEQ tablet Commonly known as: KLOR-CON Take 0.5 tablets (10 mEq total) by mouth daily. Notes to patient: 12/28/2019   rosuvastatin 20 MG tablet Commonly known as: CRESTOR Take 1 tablet (20 mg total) by mouth at bedtime. Notes to patient: 12/27/2019 @ 10:00 pm    sucralfate 1 GM/10ML suspension Commonly known as: CARAFATE Take 10 mLs (1 g total) by mouth 4 (four) times daily -  with meals and at bedtime. Notes to patient: 12/27/2019 @ 5:00 pm       Follow-up Information    Janit Pagan T, MD Follow up in 1 week(s).   Specialty: Family Medicine Contact information: [address] [place]  [address] [phone]        Nahser, Deloris Ping, MD Follow up on 01/10/2020.   Specialty: Cardiology Why: at 8:15 AM with his PA Ofilia Neas information: [address] [place] [address] [phone]          Allergies  Allergen Reactions  . Ace Inhibitors Swelling and Other (See Comments)    Eyes swell  . Lisinopril Swelling and Other (See Comments)    Swollen tongue   . Tramadol Itching    Consultations:  Cardiology   Procedures/Studies: DG Chest 2 View  Result Date: 12/25/2019 CLINICAL DATA:  Shortness of breath. EXAM: CHEST - 2 VIEW COMPARISON:  February 24, 2018 FINDINGS: No pneumothorax. Stable cardiomegaly. The hila and mediastinum are normal. The right lung is clear. Mild infiltrate in the left upper lung. No other acute abnormalities. Suggested tiny effusions. IMPRESSION: 1. Mild opacity in the left upper lung worrisome for pneumonia given history. Recommend  short-term follow-up to ensure resolution. 2. Small pleural effusions.  Cardiomegaly. Electronically Signed   By: Gerome Sam III M.D   On: 12/25/2019 12:01   ECHOCARDIOGRAM COMPLETE  Result Date: 12/26/2019    ECHOCARDIOGRAM REPORT   Patient Name:   Jennifer Foster Date of Exam: 12/26/2019 Medical Rec #:  [id number]      Height:       64.0 in Accession #:    [id number]     Weight:       141.0 lb Date of Birth:  [birth year]      BSA:          1.686 m Patient Age:    73 years       BP:           155/70 mmHg Patient Gender: F              HR:           50 bpm. Exam Location:  Inpatient Procedure: 2D Echo Indications:    Abnormal ECG 794.31 / R94.31  History:        Patient has prior history of Echocardiogram examinations, most                 recent 10/22/2017. CAD, Arrythmias:Atrial Fibrillation; Risk                 Factors:Hypertension, Tobacco abuse and Dyslipidemia.  Sonographer:    Leeroy Bock Turrentine Referring Phys: [id number] Jhoanna Heyde A Jareb Radoncic IMPRESSIONS  1. Left ventricular ejection fraction, by estimation, is 50%. The left ventricle has low normal function. The left ventricle demonstrates regional wall motion abnormalities (see scoring diagram/findings for description). There is moderate left ventricular hypertrophy. Left ventricular diastolic parameters are indeterminate. There is moderate hypokinesis of the left ventricular, basal-mid lateral wall.  2. Right ventricular systolic function is mildly reduced. The right ventricular size is normal. There is severely elevated pulmonary artery systolic pressure. The estimated right ventricular systolic pressure is 63.7 mmHg.  3. Left atrial size was severely dilated.  4. Right atrial size was severely dilated.  5. The mitral valve is normal in structure. Mild to moderate mitral valve regurgitation. No evidence of mitral stenosis.  6. Tricuspid valve regurgitation is mild to moderate.  7. The aortic valve is tricuspid. Aortic valve regurgitation is trivial. No aortic  stenosis is present.  8. The inferior vena cava is dilated in size with >50% respiratory variability, suggesting right atrial pressure of 8 mmHg. FINDINGS  Left Ventricle: Left ventricular ejection fraction,  by estimation, is 50%. The left ventricle has low normal function. The left ventricle demonstrates regional wall motion abnormalities. Moderate hypokinesis of the left ventricular, basal-mid lateral wall. The left ventricular internal cavity size was normal in size. There is moderate left ventricular hypertrophy. Left ventricular diastolic parameters are indeterminate. Right Ventricle: The right ventricular size is normal. No increase in right ventricular wall thickness. Right ventricular systolic function is mildly reduced. There is severely elevated pulmonary artery systolic pressure. The tricuspid regurgitant velocity is 3.73 m/s, and with an assumed right atrial pressure of 8 mmHg, the estimated right ventricular systolic pressure is 63.7 mmHg. Left Atrium: Left atrial size was severely dilated. Right Atrium: Right atrial size was severely dilated. Pericardium: A small pericardial effusion is present. The pericardial effusion is localized near the right atrium. Mitral Valve: The mitral valve is normal in structure. Normal mobility of the mitral valve leaflets. Mild to moderate mitral valve regurgitation. No evidence of mitral valve stenosis. Tricuspid Valve: The tricuspid valve is normal in structure. Tricuspid valve regurgitation is mild to moderate. No evidence of tricuspid stenosis. Aortic Valve: The aortic valve is tricuspid. Aortic valve regurgitation is trivial. No aortic stenosis is present. Pulmonic Valve: The pulmonic valve was normal in structure. Pulmonic valve regurgitation is not visualized. No evidence of pulmonic stenosis. Aorta: The aortic root is normal in size and structure. Venous: The inferior vena cava is dilated in size with greater than 50% respiratory variability, suggesting right  atrial pressure of 8 mmHg. IAS/Shunts: No atrial level shunt detected by color flow Doppler.  LEFT VENTRICLE PLAX 2D LVIDd:         4.94 cm  Diastology LVIDs:         3.33 cm  LV e' medial:   6.41 cm/s LV PW:         1.37 cm  LV E/e' medial: 21.2 LV IVS:        1.40 cm LVOT diam:     2.10 cm LV SV:         74 LV SV Index:   44 LVOT Area:     3.46 cm  RIGHT VENTRICLE RV S prime:     10.50 cm/s TAPSE (M-mode): 1.8 cm LEFT ATRIUM              Index        RIGHT ATRIUM           Index LA diam:        5.20 cm  3.08 cm/m   RA Area:     24.60 cm LA Vol (A2C):   168.0 ml 99.63 ml/m  RA Volume:   75.00 ml  44.48 ml/m LA Vol (A4C):   174.0 ml 103.19 ml/m LA Biplane Vol: 187.0 ml 110.89 ml/m  AORTIC VALVE LVOT Vmax:   98.80 cm/s LVOT Vmean:  69.200 cm/s LVOT VTI:    0.215 m  AORTA Ao Root diam: 3.30 cm MITRAL VALVE                TRICUSPID VALVE MV Area (PHT): 3.85 cm     TR Peak grad:   55.7 mmHg MV Decel Time: 197 msec     TR Vmax:        373.00 cm/s MV E velocity: 136.00 cm/s                             SHUNTS  Systemic VTI:  0.22 m                             Systemic Diam: 2.10 cm Weston Brass MD Electronically signed by Weston Brass MD Signature Date/Time: 12/26/2019/1:23:27 PM    Final    VAS [place] LOWER EXTREMITY VENOUS (DVT)  Result Date: 12/06/2019  Lower Venous DVTStudy Indications: Foot swelling/ Gout.  Comparison Study: no prior Performing Technologist: Jeb Levering RDMS, RVT  Examination Guidelines: A complete evaluation includes B-mode imaging, spectral Doppler, color Doppler, and power Doppler as needed of all accessible portions of each vessel. Bilateral testing is considered an integral part of a complete examination. Limited examinations for reoccurring indications may be performed as noted. The reflux portion of the exam is performed with the patient in reverse Trendelenburg.  +-----+---------------+---------+-----------+----------+--------------+  RIGHTCompressibilityPhasicitySpontaneityPropertiesThrombus Aging +-----+---------------+---------+-----------+----------+--------------+ CFV  Full           Yes      Yes                                 +-----+---------------+---------+-----------+----------+--------------+   +---------+---------------+---------+-----------+----------+------------------+ LEFT     CompressibilityPhasicitySpontaneityPropertiesThrombus Aging     +---------+---------------+---------+-----------+----------+------------------+ CFV      Full           Yes      Yes                                     +---------+---------------+---------+-----------+----------+------------------+ SFJ      Full                                                            +---------+---------------+---------+-----------+----------+------------------+ FV Prox  Full                                                            +---------+---------------+---------+-----------+----------+------------------+ FV Mid   Full                                                            +---------+---------------+---------+-----------+----------+------------------+ FV DistalFull                                                            +---------+---------------+---------+-----------+----------+------------------+ PFV      Full                                                            +---------+---------------+---------+-----------+----------+------------------+  POP      Full           Yes      Yes                  Dilated area of                                                          the vein           +---------+---------------+---------+-----------+----------+------------------+ PTV      Full                                                            +---------+---------------+---------+-----------+----------+------------------+ PERO     Full                                                             +---------+---------------+---------+-----------+----------+------------------+     Summary: RIGHT: - No evidence of common femoral vein obstruction.  LEFT: - There is no evidence of deep vein thrombosis in the lower extremity.  - No cystic structure found in the popliteal fossa.  *See table(s) above for measurements and observations. Electronically signed by Waverly Ferrari MD on 12/06/2019 at 3:14:37 PM.    Final        Subjective: She is breathing better.cough has improved. Feels well to go home  Discharge Exam: Vitals:   12/27/19 0459 12/27/19 1446  BP: (!) 163/93 (!) 143/73  Pulse: 76 70  Resp: 18 20  Temp: 97.6 F (36.4 C) 98 F (36.7 C)  SpO2: 99% 99%   Vitals:   12/26/19 1842 12/26/19 2106 12/27/19 0459 12/27/19 1446  BP:  (!) 156/89 (!) 163/93 (!) 143/73  Pulse:  71 76 70  Resp:  20 18 20   Temp:  98.4 F (36.9 C) 97.6 F (36.4 C) 98 F (36.7 C)  TempSrc:  Oral Oral Oral  SpO2: 98% 98% 99% 99%  Weight:      Height:        General: Pt is alert, awake, not in acute distress Cardiovascular: RRR, S1/S2 +, no rubs, no gallops Respiratory: CTA bilaterally, no wheezing, no rhonchi Abdominal: Soft, NT, ND, bowel sounds + Extremities: no edema, no cyanosis    The results of significant diagnostics from this hospitalization (including imaging, microbiology, ancillary and laboratory) are listed below for reference.     Microbiology: Recent Results (from the past 240 hour(s))  SARS CORONAVIRUS 2 (TAT 6-24 HRS) Nasopharyngeal Nasopharyngeal Swab     Status: None   Collection Time: 12/25/19  1:34 PM   Specimen: Nasopharyngeal Swab  Result Value Ref Range Status   SARS Coronavirus 2 NEGATIVE NEGATIVE Final    Comment: (NOTE) SARS-CoV-2 target nucleic acids are NOT DETECTED. The SARS-CoV-2 RNA is generally detectable in upper and lower respiratory specimens during the acute phase of infection. Negative results do not preclude SARS-CoV-2  infection, do not rule out co-infections with  other pathogens, and should not be used as the sole basis for treatment or other patient management decisions. Negative results must be combined with clinical observations, patient history, and epidemiological information. The expected result is Negative. Fact Sheet for Patients: HairSlick.no Fact Sheet for Healthcare Providers: quierodirigir.com This test is not yet approved or cleared by the [place] FDA and  has been authorized for detection and/or diagnosis of SARS-CoV-2 by FDA under an Emergency Use Authorization (EUA). This EUA will remain  in effect (meaning this test can be used) for the duration of the COVID-19 declaration under Section 56 4(b)(1) of the Act, 21 U.S.C. section 360bbb-3(b)(1), unless the authorization is terminated or revoked sooner. Performed at [facility], [address], [place], [place] [address]      Labs: BNP (last 3 results) No results for input(s): BNP in the last 8760 hours. Basic Metabolic Panel: Recent Labs  Lab 12/25/19 1207 12/26/19 0553 12/27/19 0544  NA 144 142 142  K 2.8* 3.4* 3.2*  CL 106 108 110  CO2 26 24 24   GLUCOSE 84 81 84  BUN 14 13 11   CREATININE 2.36* 2.23* 2.28*  CALCIUM 9.3 8.7* 9.0   Liver Function Tests: Recent Labs  Lab 12/26/19 0553  AST 15  ALT 11  ALKPHOS 56  BILITOT 1.5*  PROT 5.7*  ALBUMIN 2.7*   No results for input(s): LIPASE, AMYLASE in the last 168 hours. No results for input(s): AMMONIA in the last 168 hours. CBC: Recent Labs  Lab 12/25/19 1207 12/26/19 0553 12/27/19 0544  WBC 4.8 4.9 4.7  HGB 8.6* 8.0* 8.1*  HCT 25.7* 24.5* 24.9*  MCV 79.3* 80.9 81.1  PLT 141* 123* 123*   Cardiac Enzymes: No results for input(s): CKTOTAL, CKMB, CKMBINDEX, TROPONINI in the last 168 hours. BNP: Invalid input(s): POCBNP CBG: No results for input(s): GLUCAP in the last 168 hours. D-Dimer No  results for input(s): DDIMER in the last 72 hours. Hgb A1c No results for input(s): HGBA1C in the last 72 hours. Lipid Profile No results for input(s): CHOL, HDL, LDLCALC, TRIG, CHOLHDL, LDLDIRECT in the last 72 hours. Thyroid function studies No results for input(s): TSH, T4TOTAL, T3FREE, THYROIDAB in the last 72 hours.  Invalid input(s): FREET3 Anemia work up No results for input(s): VITAMINB12, FOLATE, FERRITIN, TIBC, IRON, RETICCTPCT in the last 72 hours. Urinalysis    Component Value Date/Time   COLORURINE YELLOW 09/04/2019 2329   APPEARANCEUR CLEAR 09/04/2019 2329   LABSPEC 1.010 09/04/2019 2329   PHURINE 5.0 09/04/2019 2329   GLUCOSEU NEGATIVE 09/04/2019 2329   HGBUR NEGATIVE 09/04/2019 2329   BILIRUBINUR NEGATIVE 09/04/2019 2329   BILIRUBINUR SMALL 03/18/2016 1054   KETONESUR NEGATIVE 09/04/2019 2329   PROTEINUR 100 (A) 09/04/2019 2329   UROBILINOGEN 0.2 03/18/2016 1054   UROBILINOGEN 0.2 06/25/2015 0920   NITRITE NEGATIVE 09/04/2019 2329   LEUKOCYTESUR TRACE (A) 09/04/2019 2329   Sepsis Labs Invalid input(s): PROCALCITONIN,  WBC,  LACTICIDVEN Microbiology Recent Results (from the past 240 hour(s))  SARS CORONAVIRUS 2 (TAT 6-24 HRS) Nasopharyngeal Nasopharyngeal Swab     Status: None   Collection Time: 12/25/19  1:34 PM   Specimen: Nasopharyngeal Swab  Result Value Ref Range Status   SARS Coronavirus 2 NEGATIVE NEGATIVE Final    Comment: (NOTE) SARS-CoV-2 target nucleic acids are NOT DETECTED. The SARS-CoV-2 RNA is generally detectable in upper and lower respiratory specimens during the acute phase of infection. Negative results do not preclude SARS-CoV-2 infection, do not rule out co-infections  with other pathogens, and should not be used as the sole basis for treatment or other patient management decisions. Negative results must be combined with clinical observations, patient history, and epidemiological information. The expected result is Negative. Fact  Sheet for Patients: HairSlick.no Fact Sheet for Healthcare Providers: quierodirigir.com This test is not yet approved or cleared by the [place] FDA and  has been authorized for detection and/or diagnosis of SARS-CoV-2 by FDA under an Emergency Use Authorization (EUA). This EUA will remain  in effect (meaning this test can be used) for the duration of the COVID-19 declaration under Section 56 4(b)(1) of the Act, 21 U.S.C. section 360bbb-3(b)(1), unless the authorization is terminated or revoked sooner. Performed at [facility], [address], [place], [place] [address]      Time coordinating discharge: Over 30 minutes  SIGNED:   Alba Cory, MD  [facility] 12/27/2019, 8:30 PM Pager   If 7PM-7AM, please contact night-coverage [email or web address] Password [email or web address]

## 2019-12-27 NOTE — Consult Note (Addendum)
Cardiology Consultation:   Patient ID: Jennifer Foster MRN: [id number]; DOB: 19-Jun-1946  Admit date: 12/25/2019 Date of Consult: 12/27/2019  Primary Care Provider: [name], MD Primary Cardiologist: Mertie Moores, MD  Primary Electrophysiologist:  None    Patient Profile:   Jennifer Foster is a 74 y.o. female with a hx of HTN, CAD, Chronic diastolic HF, paraxysmal A fib not on eliquis due to GI bleed, CKD3b, HLD who is being seen today for the evaluation of elevated troponin and abnormal echo at the request of Dr. Tyrell Antonio.  History of Present Illness:   [name] with above hx and cardiac cath 12/03/17 with single vessel disease with mRCA 60% and focal 60-70% dRCA stenosis, there was mild to moderate disease involving a large tortuous LAD and LCx.Medical therapy was recommended.Has had paroxysmal a fib with SR on EKGs until 2019.  No EKGs in 2020 due to COVID and telehealth visits.   It appears she has been in a fib since Jan 2021.  She was admitted in Jan with GI bleed and was to resume eliquis 1 week later which she did not.    Pt now admitted 12/25/19 with cough and greenish brown sputum, DOE, desat as well and PNA on CXR.  Placed on ABX.  Her eliquis was resumed.  She was noted to have mild thrombocytopenia, prolonged QT with hypokalemia.   Troponins 30,28,38  K+ was 2.8 on admit and Cr 2.36 now K+ 3.2  And Cr 2.28  Hgb 8.6 plts 141 and now plts 123, Hgb 8.1  COVID neg      EKG:  The EKG was personally reviewed and demonstrates:  Atrial fib with slow VR at 55, +LVH with deep T wave inversions in lateral leads, which are old. Qtc 520 ms  On repeat on the 26th A fib with slow VR at 57, no other changes except Qtc is now 442ms Telemetry:  Telemetry was personally reviewed and demonstrates:  8 beats of WCT and irregular possible aberrancy vs VT.  Echo 12/26/19 with EF 50%, and LV demonstrates regional wall motion abnormalities.  Moderate LVH  There is moderate hypokinesis of the  left ventricular,  basal-mid lateral wall.  RV function is mildly reduced, +severely elevated pulmonary artery systolic pressure.  Estimated RV systolic pressure is 62.8 ,  LA and RA size severely dilated,  Mild to mod MR   Echo from 2019 with EF 60-65%, moderate LVH, G2DD, trivial MR;   Pulmonary arteries: PA peak pressure: 30 mm Hg (S).  Moderate to severe LA enlargement.    Pt has lost wt from 173 lbs in 2019 to 142.34 lbs now.   Currently feeling better.  She has been DOE for some time but it recently increased.  No chest pain. No bleeding she is aware of.  No lightheadedness or dizziness. No awareness of HR.        Past Medical History:  Diagnosis Date  . Abnormal stress test 12/03/2017  . Acute blood loss anemia 06/26/2015  . Acute on chronic blood loss anemia 06/26/2015  . Acute respiratory failure with hypoxia ([place]) 02/21/2018  . Aortic atherosclerosis ([place]) 01/01/2018   CT 04/2017  . Asthma   . Atrial fibrillation ([place]) 10/13/2017   Newly diagnosed in Jan 2019 // Apixaban for anticoag // Apixaban held in 2019 for anemia but resumed; Hgb stable since restarting  . CAD (coronary artery disease) 11/27/2017   Nuc stress 3/19: anterior ischemia, ?inf scar, EF 50, intermediate risk //  LHC 4/19: LAD mild dz, pLCx 35, OM2 30, mRCA 60, dRCA 65, LVEDP 15-20  . CAP (community acquired pneumonia) 02/21/2018  . Chronic diastolic CHF (congestive heart failure) ([place]) 03/23/2018   Echo 2/19: Moderate LVH, EF 16-10, grade 2 diastolic dysfunction, trivial MR, moderate to severe LAE, PASP 30  . Chronic kidney disease (CKD), stage III (moderate) 06/27/2015  . DIVERTICULAR BLEEDING, HX OF 10/14/2007   Colonoscopy 2008 showed diverticulosis.   . Dizziness 11/25/2016  . GERD (gastroesophageal reflux disease)   . Gout   . History of anemia   . History of asthma   . Hyperlipemia   . Hypertension   . Joint pain   . Lipoma   . Proteinuria 01/11/2016  . Thoracic aortic aneurysm 04/03/2017   CT 8/18:  ascending thoracic aorta 4.1 cm // unable to do CTA due to CKD // Chest MRA 05/2019: Ascending thoracic aorta 40 mm    Past Surgical History:  Procedure Laterality Date  . ABDOMINAL HYSTERECTOMY  1981   partial, per pt history  . BIOPSY  09/05/2019   Procedure: BIOPSY;  Surgeon: Ronnette Juniper, MD;  Location: [facility];  Service: Gastroenterology;;  . COLONOSCOPY N/A 12/08/2013   Procedure: COLONOSCOPY;  Surgeon: [name], MD;  Location: [place];  Service: Endoscopy;  Laterality: N/A;  . COLONOSCOPY N/A 06/28/2015   Procedure: COLONOSCOPY;  Surgeon: Clarene Essex, MD;  Location: [facility] END[facility]COPY;  Service: Endoscopy;  Laterality: N/A;  . ESOPHAGOGASTRODUODENOSCOPY N/A 12/08/2013   Procedure: ESOPHAGOGASTRODUODENOSCOPY (EGD);  Surgeon: [name], MD;  Location: [facility] ENDOSCOPY;  Service: Endoscopy;  Laterality: N/A;  . ESOPHAGOGASTRODUODENOSCOPY (EGD) WITH PROPOFOL N/A 09/05/2019   Procedure: ESOPHAGOGASTRODUODENOSCOPY (EGD) WITH PROPOFOL;  Surgeon: Ronnette Juniper, MD;  Location: [facility] ENDOSCOPY;  Service: Gastroenterology;  Laterality: N/A;  . GIVENS CAPSULE STUDY N/A 12/08/2013   Procedure: GIVENS CAPSULE STUDY;  Surgeon: [name], MD;  Location: [place];  Service: Endoscopy;  Laterality: N/A;  . LEFT HEART CATH AND CORONARY ANGIOGRAPHY N/A 12/03/2017   Procedure: LEFT HEART CATH AND CORONARY ANGIOGRAPHY;  Surgeon: Nelva Bush, MD;  Location: [place] CV [facility];  Service: Cardiovascular;  Laterality: N/A;  . LIPOMA EXCISION  01/28/11   neck     Home Medications:  Prior to Admission medications   Medication Sig Start Date End Date Taking? Authorizing Provider  albuterol (VENTOLIN HFA) 108 (90 Base) MCG/ACT inhaler Inhale 2 puffs into the lungs every 6 (six) hours as needed for wheezing or shortness of breath. 11/14/19  Yes Eniola, Tawanna Solo T, MD  amLODipine (NORVASC) 5 MG tablet TAKE 1 TABLET(5 MG) BY MOUTH TWICE DAILY. Please make yearly appt with Dr. Acie Fredrickson for May for future refills.  1st attempt Patient taking differently: Take 5 mg by mouth in the morning and at bedtime.  12/07/19  Yes Nahser, Wonda Cheng, MD  Baclofen 5 MG TABS Take 1 tablet by mouth every 12 (twelve) hours as needed. 09/23/19  Yes [name], MD  calcitRIOL (ROCALTROL) 0.25 MCG capsule Take 0.25 mcg by mouth daily.  12/24/18  Yes [provider]  Cod Liver Oil 1000 MG CAPS Take 1,000 mg by mouth daily.    Yes [provider]  escitalopram (LEXAPRO) 10 MG tablet TAKE 1 TABLET(10 MG) BY MOUTH DAILY Patient taking differently: Take 10 mg by mouth daily. TAKE 1 TABLET(10 MG) BY MOUTH DAILY 08/31/19  Yes Andrena Mews T, MD  furosemide (LASIX) 40 MG tablet TAKE 1/2 TABLET(20 MG) BY MOUTH DAILY Patient taking differently:  Take 20 mg by mouth daily.  09/08/19  Yes End, Harrell Gave, MD  ipratropium-albuterol (DUONEB) 0.5-2.5 (3) MG/3ML SOLN Take 3 mLs by nebulization every 4 (four) hours as needed. Patient taking differently: Take 3 mLs by nebulization every 4 (four) hours as needed (wheezing/shortness of breath).  08/27/18  Yes Raylene Everts, MD  irbesartan (AVAPRO) 150 MG tablet TAKE 1 TABLET BY MOUTH EVERY DAY Patient taking differently: Take 150 mg by mouth daily.  08/16/19  Yes Weaver, Scott T, PA-C  isosorbide mononitrate (IMDUR) 30 MG 24 hr tablet Take 1 tablet (30 mg total) by mouth daily. 01/10/19 01/10/20 Yes Weaver, Scott T, PA-C  metoprolol succinate (TOPROL-XL) 25 MG 24 hr tablet TAKE 1 TABLET BY MOUTH EVERY DAY(KEEP UPCOMING APPT IN MAY FOR FUTURE REFILLS) Patient taking differently: Take 25 mg by mouth daily.  04/12/19  Yes Weaver, Scott T, PA-C  Multiple Vitamin (MULTIVITAMIN WITH MINERALS) TABS tablet Take 1 tablet by mouth daily.   Yes [provider]  nitroGLYCERIN (NITROSTAT) 0.4 MG SL tablet Place 1 tablet (0.4 mg total) under the tongue every 5 (five) minutes as needed for chest pain. If do not resolve after 1 tablet, go to ED 10/13/17  Yes Smiley Houseman, MD   pantoprazole (PROTONIX) 40 MG tablet Take 1 tablet (40 mg total) by mouth 2 (two) times daily before a meal. 09/06/19 12/25/19 Yes Adhikari, Tamsen Meek, MD  rosuvastatin (CRESTOR) 20 MG tablet Take 1 tablet (20 mg total) by mouth at bedtime. 11/08/19  Yes [name], MD  apixaban (ELIQUIS) 5 MG TABS tablet Take 1 tablet (5 mg total) by mouth 2 (two) times daily. Call to schedule Cardiologist appt. Thanks 11/07/19   Nahser, Wonda Cheng, MD  potassium chloride SA (KLOR-CON) 20 MEQ tablet Take 0.5 tablets (10 mEq total) by mouth daily. Patient not taking: Reported on 10/26/2019 07/15/19   Richardson Dopp T, PA-C  sucralfate (CARAFATE) 1 GM/10ML suspension Take 10 mLs (1 g total) by mouth 4 (four) times daily -  with meals and at bedtime. 09/06/19 10/06/19  Shelly Coss, MD    Inpatient Medications: Scheduled Meds: . amLODipine  5 mg Oral BID  . apixaban  5 mg Oral BID  . azithromycin  500 mg Oral Daily  . escitalopram  10 mg Oral Daily  . guaiFENesin  600 mg Oral BID  . hydrALAZINE  25 mg Oral Q8H  . isosorbide mononitrate  30 mg Oral Daily  . metoprolol succinate  25 mg Oral Daily  . pantoprazole  40 mg Oral BID AC  . rosuvastatin  20 mg Oral QHS  . sucralfate  1 g Oral TID WC & HS   Continuous Infusions: . cefTRIAXone (ROCEPHIN)  IV 2 g (12/26/19 1539)   PRN Meds: acetaminophen **OR** acetaminophen, ipratropium-albuterol, nitroGLYCERIN, ondansetron **OR** ondansetron (ZOFRAN) IV  Allergies:    Allergies  Allergen Reactions  . Ace Inhibitors Swelling and Other (See Comments)    Eyes swell  . Lisinopril Swelling and Other (See Comments)    Swollen tongue   . Tramadol Itching    Social History:   Social History   Socioeconomic History  . Marital status: Married    Spouse name: Not on file  . Number of children: Not on file  . Years of education: Not on file  . Highest education level: Not on file  Occupational History  . Occupation: unemployed  Tobacco Use  . Smoking status:  Former Smoker    Packs/day: 0.25  Types: Cigarettes  . Smokeless tobacco: Never Used  Substance and Sexual Activity  . Alcohol use: Yes    Alcohol/week: 0.0 standard drinks    Comment: occasionally  . Drug use: No  . Sexual activity: Yes  Other Topics Concern  . Not on file  Social History Narrative   Lives with husband Payden Bonus who is also an [facility] patient.  History of Marijuana and crack use.  Had cocaine + in May 2012   Social Determinants of Health   Financial Resource Strain:   . Difficulty of Paying Living Expenses:   Food Insecurity: No Food Insecurity  . Worried About Charity fundraiser in the Last Year: Never true  . Ran Out of Food in the Last Year: Never true  Transportation Needs: Unmet Transportation Needs  . Lack of Transportation (Medical): Yes  . Lack of Transportation (Non-Medical): Yes  Physical Activity:   . Days of Exercise per Week:   . Minutes of Exercise per Session:   Stress:   . Feeling of Stress :   Social Connections:   . Frequency of Communication with Friends and Family:   . Frequency of Social Gatherings with Friends and Family:   . Attends Religious Services:   . Active Member of Clubs or Organizations:   . Attends Archivist Meetings:   [name] Marital Status:   Intimate Partner Violence:   . Fear of Current or Ex-Partner:   . Emotionally Abused:   [name] Physically Abused:   . Sexually Abused:     Family History:    Family History  Problem Relation Age of Onset  . Diabetes type II Other   . Kidney failure Other   . Kidney failure Son      ROS:  Please see the history of present illness.  General:no colds or fevers, no weight changes Skin:no rashes or ulcers HEENT:no blurred vision, no congestion CV:see HPI PUL:see HPI GI:no diarrhea constipation or melena, no indigestion GU:no hematuria, no dysuria MS:no joint pain, no claudication Neuro:no syncope, no lightheadedness Endo:no diabetes, no thyroid disease  All other  ROS reviewed and negative.     Physical Exam/Data:   Vitals:   12/26/19 1702 12/26/19 1842 12/26/19 2106 12/27/19 0459  BP: (!) 182/80  (!) 156/89 (!) 163/93  Pulse: 72  71 76  Resp:   20 18  Temp:   98.4 F (36.9 C) 97.6 F (36.4 C)  TempSrc:   Oral Oral  SpO2: 97% 98% 98% 99%  Weight: 64.7 kg     Height: 5\' 4"  (1.626 m)       Intake/Output Summary (Last 24 hours) at 12/27/2019 0753 Last data filed at 12/26/2019 1800 Gross per 24 hour  Intake 820 ml  Output 3 ml  Net 817 ml   Last 3 Weights 12/26/2019 12/06/2019 12/06/2019  Weight (lbs) 142 lb 9.6 oz 141 lb (No Data)  Weight (kg) 64.683 kg 63.957 kg (No Data)     Body mass index is 24.48 kg/m.  General:  Thin female, in no acute distress HEENT: normal Lymph: no adenopathy Neck: + JVD, + HJR  Endocrine:  No thryomegaly Vascular: No carotid bruits; pedal pulses 2+ bilaterally  Cardiac:  irreg irreg 2/6 systolic murmur no gallup or rub Lungs:  Diminished breath sounds to auscultation bilaterally, no wheezing, some  rhonchi and rales  Abd: soft, nontender, no hepatomegaly  Ext: no edema- edema on admit resolved Musculoskeletal:  No deformities, BUE and BLE strength normal and equal  Skin: warm and dry  Neuro: alert and oriented X 3 MAE follows commands, no focal abnormalities noted Psych:  Normal affect    Relevant CV Studies: Echo 12/26/19 IMPRESSIONS    1. Left ventricular ejection fraction, by estimation, is 50%. The left  ventricle has low normal function. The left ventricle demonstrates  regional wall motion abnormalities (see scoring diagram/findings for  description). There is moderate left  ventricular hypertrophy. Left ventricular diastolic parameters are  indeterminate. There is moderate hypokinesis of the left ventricular,  basal-mid lateral wall.  2. Right ventricular systolic function is mildly reduced. The right  ventricular size is normal. There is severely elevated pulmonary artery  systolic  pressure. The estimated right ventricular systolic pressure is  72.6 mmHg.  3. Left atrial size was severely dilated.  4. Right atrial size was severely dilated.  5. The mitral valve is normal in structure. Mild to moderate mitral valve  regurgitation. No evidence of mitral stenosis.  6. Tricuspid valve regurgitation is mild to moderate.  7. The aortic valve is tricuspid. Aortic valve regurgitation is trivial.  No aortic stenosis is present.  8. The inferior vena cava is dilated in size with >50% respiratory  variability, suggesting right atrial pressure of 8 mmHg.   FINDINGS  Left Ventricle: Left ventricular ejection fraction, by estimation, is  50%. The left ventricle has low normal function. The left ventricle  demonstrates regional wall motion abnormalities. Moderate hypokinesis of  the left ventricular, basal-mid lateral  wall. The left ventricular internal cavity size was normal in size. There  is moderate left ventricular hypertrophy. Left ventricular diastolic  parameters are indeterminate.   Right Ventricle: The right ventricular size is normal. No increase in  right ventricular wall thickness. Right ventricular systolic function is  mildly reduced. There is severely elevated pulmonary artery systolic  pressure. The tricuspid regurgitant  velocity is 3.73 m/s, and with an assumed right atrial pressure of 8 mmHg,  the estimated right ventricular systolic pressure is 20.3 mmHg.   Left Atrium: Left atrial size was severely dilated.   Right Atrium: Right atrial size was severely dilated.   Pericardium: A small pericardial effusion is present. The pericardial  effusion is localized near the right atrium.   Mitral Valve: The mitral valve is normal in structure. Normal mobility of  the mitral valve leaflets. Mild to moderate mitral valve regurgitation. No  evidence of mitral valve stenosis.   Tricuspid Valve: The tricuspid valve is normal in structure. Tricuspid   valve regurgitation is mild to moderate. No evidence of tricuspid  stenosis.   Aortic Valve: The aortic valve is tricuspid. Aortic valve regurgitation is  trivial. No aortic stenosis is present.   Pulmonic Valve: The pulmonic valve was normal in structure. Pulmonic valve  regurgitation is not visualized. No evidence of pulmonic stenosis.   Aorta: The aortic root is normal in size and structure.   Venous: The inferior vena cava is dilated in size with greater than 50%  respiratory variability, suggesting right atrial pressure of 8 mmHg.   IAS/Shunts: No atrial level shunt detected by color flow Doppler.   MRI angio chest   Thoracic aortic measurements:  Sinotubular junction  32 mm as measured in greatest oblique short axis sagittal dimension (image 94, series 6).  Proximal ascending aorta  40 mm as measured in greatest oblique short axis axial dimension at the level of the main pulmonary artery (image 9, series 2) and approximately 40 mm in greatest oblique short axis sagittal diameter (  image 105, series 4), grossly unchanged compared to chest CT performed 04/2017, previously, 42 mm.  Aortic arch aorta  29 mm as measured in greatest oblique short axis sagittal dimension (image 78, series 6).  Proximal descending thoracic aorta  27 mm as measured in greatest oblique short axis axial dimension at the level of the main pulmonary artery (image 9, series 2).  Distal descending thoracic aorta  28 mm as measured in greatest oblique short axis axial dimension at the level of the diaphragmatic hiatus.  Review of the MIP images confirms the above findings.  -------------------------------------------------------------  Non-Vascular Findings:  Mediastinum/Lymph Nodes: No bulky mediastinal, hilar axillary lymphadenopathy.  Lungs/Pleura: No discrete focal airspace opacities. No pleural effusion.  Upper abdomen: Limited noncontrast evaluation of the  upper abdomen demonstrates a T2 intense lesion within the left kidney, incompletely evaluated though favored to represent a renal cyst (image 20, series 12  Musculoskeletal: No definitive acute or aggressive osseous abnormalities. Note is again made of substernal extension of the right lobe of the thyroid as demonstrated on remote chest CT (represent image 1, series 2).  IMPRESSION: 1. 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 Recommend annual imaging followup by CTA or MRA. This recommendation follows 2010 ACCF/AHA/AATS/ACR/ASA/SCA/SCAI/SIR/STS/SVM Guidelines for the Diagnosis and Management of Patients with Thoracic Aortic Disease. Circulation. 2010; 121: V425-Z563. 2. Aortic aneurysm NOS (ICD10-I71.9) 3. Cardiomegaly. 4.  Aortic Atherosclerosis (ICD10-I70.0).    Laboratory Data:  High Sensitivity Troponin:   Recent Labs  Lab 12/25/19 1207 12/25/19 1333 12/25/19 1915  TROPONINIHS 30* 28* 28*     Chemistry Recent Labs  Lab 12/25/19 1207 12/26/19 0553 12/27/19 0544  NA 144 142 142  K 2.8* 3.4* 3.2*  CL 106 108 110  CO2 26 24 24   GLUCOSE 84 81 84  BUN 14 13 11   CREATININE 2.36* 2.23* 2.28*  CALCIUM 9.3 8.7* 9.0  GFRNONAA 20* 21* 21*  GFRAA 23* 25* 24*  ANIONGAP 12 10 8     Recent Labs  Lab 12/26/19 0553  PROT 5.7*  ALBUMIN 2.7*  AST 15  ALT 11  ALKPHOS 56  BILITOT 1.5*   Hematology Recent Labs  Lab 12/25/19 1207 12/26/19 0553 12/27/19 0544  WBC 4.8 4.9 4.7  RBC 3.24* 3.03* 3.07*  HGB 8.6* 8.0* 8.1*  HCT 25.7* 24.5* 24.9*  MCV 79.3* 80.9 81.1  MCH 26.5 26.4 26.4  MCHC 33.5 32.7 32.5  RDW 15.5 15.6* 15.9*  PLT 141* 123* 123*   BNPNo results for input(s): BNP, PROBNP in the last 168 hours.  DDimer No results for input(s): DDIMER in the last 168 hours.   Radiology/Studies:  DG Chest 2 View  Result Date:  12/25/2019 CLINICAL DATA:  Shortness of breath. EXAM: CHEST - 2 VIEW COMPARISON:  February 24, 2018 FINDINGS: No pneumothorax. Stable cardiomegaly. The hila and mediastinum are normal. The right lung is clear. Mild infiltrate in the left upper lung. No other acute abnormalities. Suggested tiny effusions. IMPRESSION: 1. Mild opacity in the left upper lung worrisome for pneumonia given history. Recommend short-term follow-up to ensure resolution. 2. Small pleural effusions.  Cardiomegaly. Electronically Signed   By: Dorise Bullion III M.D   On: 12/25/2019 12:01   ECHOCARDIOGRAM COMPLETE  Result Date: 12/26/2019    ECHOCARDIOGRAM REPORT   Patient Name:   Jennifer Foster Date of Exam: 12/26/2019 Medical Rec #:  [id number]      Height:  64.0 in Accession #:    [id number]     Weight:       141.0 lb Date of Birth:  10-02-1945      BSA:          1.686 m Patient Age:    61 years       BP:           155/70 mmHg Patient Gender: F              HR:           50 bpm. Exam Location:  Inpatient Procedure: 2D Echo Indications:    Abnormal ECG 794.31 / R94.31  History:        Patient has prior history of Echocardiogram examinations, most                 recent 10/22/2017. CAD, Arrythmias:Atrial Fibrillation; Risk                 Factors:Hypertension, Tobacco abuse and Dyslipidemia.  Sonographer:    Vikki Ports Turrentine Referring Phys: [id number] BELKYS A REGALADO IMPRESSIONS  1. Left ventricular ejection fraction, by estimation, is 50%. The left ventricle has low normal function. The left ventricle demonstrates regional wall motion abnormalities (see scoring diagram/findings for description). There is moderate left ventricular hypertrophy. Left ventricular diastolic parameters are indeterminate. There is moderate hypokinesis of the left ventricular, basal-mid lateral wall.  2. Right ventricular systolic function is mildly reduced. The right ventricular size is normal. There is severely elevated pulmonary artery systolic pressure. The  estimated right ventricular systolic pressure is 09.6 mmHg.  3. Left atrial size was severely dilated.  4. Right atrial size was severely dilated.  5. The mitral valve is normal in structure. Mild to moderate mitral valve regurgitation. No evidence of mitral stenosis.  6. Tricuspid valve regurgitation is mild to moderate.  7. The aortic valve is tricuspid. Aortic valve regurgitation is trivial. No aortic stenosis is present.  8. The inferior vena cava is dilated in size with >50% respiratory variability, suggesting right atrial pressure of 8 mmHg. FINDINGS  Left Ventricle: Left ventricular ejection fraction, by estimation, is 50%. The left ventricle has low normal function. The left ventricle demonstrates regional wall motion abnormalities. Moderate hypokinesis of the left ventricular, basal-mid lateral wall. The left ventricular internal cavity size was normal in size. There is moderate left ventricular hypertrophy. Left ventricular diastolic parameters are indeterminate. Right Ventricle: The right ventricular size is normal. No increase in right ventricular wall thickness. Right ventricular systolic function is mildly reduced. There is severely elevated pulmonary artery systolic pressure. The tricuspid regurgitant velocity is 3.73 m/s, and with an assumed right atrial pressure of 8 mmHg, the estimated right ventricular systolic pressure is 28.3 mmHg. Left Atrium: Left atrial size was severely dilated. Right Atrium: Right atrial size was severely dilated. Pericardium: A small pericardial effusion is present. The pericardial effusion is localized near the right atrium. Mitral Valve: The mitral valve is normal in structure. Normal mobility of the mitral valve leaflets. Mild to moderate mitral valve regurgitation. No evidence of mitral valve stenosis. Tricuspid Valve: The tricuspid valve is normal in structure. Tricuspid valve regurgitation is mild to moderate. No evidence of tricuspid stenosis. Aortic Valve: The  aortic valve is tricuspid. Aortic valve regurgitation is trivial. No aortic stenosis is present. Pulmonic Valve: The pulmonic valve was normal in structure. Pulmonic valve regurgitation is not visualized. No evidence of pulmonic stenosis. Aorta: The aortic root is normal in size and structure.  Venous: The inferior vena cava is dilated in size with greater than 50% respiratory variability, suggesting right atrial pressure of 8 mmHg. IAS/Shunts: No atrial level shunt detected by color flow Doppler.  LEFT VENTRICLE PLAX 2D LVIDd:         4.94 cm  Diastology LVIDs:         3.33 cm  LV e' medial:   6.41 cm/s LV PW:         1.37 cm  LV E/e' medial: 21.2 LV IVS:        1.40 cm LVOT diam:     2.10 cm LV SV:         74 LV SV Index:   44 LVOT Area:     3.46 cm  RIGHT VENTRICLE RV S prime:     10.50 cm/s TAPSE (M-mode): 1.8 cm LEFT ATRIUM              Index        RIGHT ATRIUM           Index LA diam:        5.20 cm  3.08 cm/m   RA Area:     24.60 cm LA Vol (A2C):   168.0 ml 99.63 ml/m  RA Volume:   75.00 ml  44.48 ml/m LA Vol (A4C):   174.0 ml 103.19 ml/m LA Biplane Vol: 187.0 ml 110.89 ml/m  AORTIC VALVE LVOT Vmax:   98.80 cm/s LVOT Vmean:  69.200 cm/s LVOT VTI:    0.215 m  AORTA Ao Root diam: 3.30 cm MITRAL VALVE                TRICUSPID VALVE MV Area (PHT): 3.85 cm     TR Peak grad:   55.7 mmHg MV Decel Time: 197 msec     TR Vmax:        373.00 cm/s MV E velocity: 136.00 cm/s                             SHUNTS                             Systemic VTI:  0.22 m                             Systemic Diam: 2.10 cm Cherlynn Kaiser MD Electronically signed by Cherlynn Kaiser MD Signature Date/Time: 12/26/2019/1:23:27 PM    Final         No chest pain Assessment and Plan:   1. Elevated troponin mild, may be due to pulmonary HTN and a fib  thugh on echo there is mention of RWMA - her EF is down as well from 2019.  Her  RV systolic pressure on echo is 63.7  2. Atrial fib has been persistent since Jan 2021.  Last EKG  2019 was SR.  She has been started back on eliquis and rate is slow in the 50s at times.  Her toprol was held, was on 25 mg daily.  CHA2DS2VASc of 4-5. Wonder if the a fib is playing a role in her DOE as well.  3. WCT 8 beats. Monitor  4. HTN on imdur and hydralazine, and amlodipine, her avapro held along with BB   5. Prolonged Qtc now improved after replacing K+  6. Hypokalemia still not WNL  7. Anticoagulation stopped  in Jan due to GI bleed, she did not resume as needed.  Now has been resumed.   8. Anemia continues monitor now back on eliquis.  9. Mild fusiform aneurysmal dilatation of the ascending thoracic aorta at 40 mm per Cardiac MRI  10. Chronic diastolic HF diuretics on hold  11. PNA per IM 12. CKD-3b followed by IM  13. Weight loss 31 lbs since 2019        For questions or updates, please contact [place] Please consult [email or web address] for contact info under    Signed, Cecilie Kicks, NP  12/27/2019 7:53 AM   History and all data above reviewed.  Patient examined.  I agree with the findings as above.  The patient presented with cough.  Found to have a mildly abnormal troponin.  EF slightly low on echo and elevated pulmonary pressure.  However, she denies any recent cardiovascular symptoms.  The patient denies any new symptoms such as chest discomfort, neck or arm discomfort. There has been no new shortness of breath, PND or orthopnea. There have been no reported palpitations, presyncope or syncope.  She stays active at home.   The patient exam reveals COR:RRR, systolic murmur, holosystolic, no diastolic murmurs  ,  Lungs: Clear  ,  Abd: Positive bowel sounds, no rebound no guarding, Ext No edema  .  All available labs, radiology testing, previous records reviewed. Agree with documented assessment and plan.   Elevated troponin:  Nonspecific in this situation.  At this point I do not think that she is a cath candidate but she will need further evaluation eventually for the mildly  reduced LVEF and newly elevated pulmonary pressures.  I would suggest treating the acute issues and follow up then with Dr. Stanford Breed to consider timing or repeat echo and schedule cath if RV pressures remain elevated.    Her exam does not suggest RV dysfunction, or pulmonary HTN.    [name] [facility]  10:42 AM  12/27/2019

## 2020-01-06 ENCOUNTER — Other Ambulatory Visit: Payer: Self-pay | Admitting: Physician Assistant

## 2020-01-09 NOTE — Progress Notes (Signed)
Cardiology Office Note:    Date:  01/10/2020   ID:  VELEDA MUN, DOB [birth year], MRN [id number]  PCP:  [name], MD  Cardiologist:  Mertie Moores, MD   Electrophysiologist:  None   Referring MD: [name], MD   Chief Complaint:  Hospitalization Follow-up (Admitted with pneumonia; echo with EF 27 and severe pulmonary hypertension)    Patient Profile:    Jennifer Foster is a 74 y.o. female with:   Coronary artery disease   Myoview 3/19: ant ischemia   Cath 3/19: mRCA 60, dRCA 60-70, mild-mod dz of LAD, LCx - Med Rx  Chronic Diastolic CHF  Echocardiogram 2/19: EF 60-65, Gr 2 DD, LAE  Paroxysmal atrial fibrillation  CHA2DS2-VASc=5 (HTN, CAD, CHF, female, age x 1) >> Apixaban     Hypertension   Thoracic aortic aneurysm   CT 04/2017: 4.1 cm   MRA 05/2019: 4.0 cm   Anemia of chronic disease  Hx of Remote GI bleed  UGI bleed 09/2019  Chronic kidney disease stage 3  Hx of substance abuse (cocaine)  Prior CV studies: Echocardiogram 12/26/2019 EF 50, mod LVH, Lat HK, mild reduced RVSF, RVSP 63.7 (severely elevated), severe BAE, mild-mod MR, mild-mod TR, trivial AI  Chest MRA 05/20/2019 Asc thoracic aorta 4.0 cm  Cardiac catheterization 12/03/2017 LAD mild disease LCx proximal 35; OM230 RCA mid 60, distal 65 LVEDP 15-20  Nuclear stress test 11/12/2017 Medium size, moderate severity partially reversible anterior perfusion defect suggestive of ischemia. There is also a smaller, basal to mid inferior perfusion defect which is fixed, suggestive of artifact or scar. LVEF 50% with inferior hypokinesis. This is an intermediate risk study. Clinical correlation is recommended.  Echo 10/22/2017 Moderate LVH, EF 40-98, grade 2 diastolic dysfunction, trivial MR, moderate to severe LAE, PASP 30  Chest CT w/ contrast 04/03/17 IMPRESSION: 1. No suspicious pulmonary nodule or mass. 2. Atherosclerotic and mildly aneurysmal ascending thoracic aorta with a  maximal diameter of 4.1 cm.Recommend annual imaging followup by CTA or MRA. This recommendation follows 2010 ACCF/AHA/AATS/ACR/ASA/SCA/SCAI/SIR/STS/SVM Guidelines for the Diagnosis and Management of Patients with Thoracic Aortic Disease. Circulation. 2010; 121: J191-Y782 3. Coronary artery calcifications 4. Mild centrilobular pulmonary emphysema. 5. Multinodular thyroid goiter. 6. Left adrenal thickening likely representing hyperplasia and 1.9 cm right adrenal adenoma. Aortic Atherosclerosis(ICD10-170.0); Aortic Atherosclerosis (ICD10-I70.0) and Emphysema (ICD10-J43.9).  Holter 7/18 NSR with PAC's Brief short SVT Overall, unremarkable  Nuclear stress test 11/23/2014 Normal stress nuclear study.overall low risk with no evidence of significant ischemia identified. Left ventricular hypertrophy suggested by increased wall thickness seen on perfusion images. LV Ejection Fraction: 56%.  Echo 03/16/2013 Severe LVH, EF 55-60, mild MR, mild to moderate LAE, PASP 33  History of Present Illness:    Ms. Jennifer Foster was last seen via Telemedicine in 12/2018.  She was admitted in 09/2019 with UGI bleeding due to gastric ulcers.  She was started back on Apixaban 5 days post DC.  She was most recently admitted 4/25-4/27 with community acquired pneumonia.  She was noted to have mildly elevated hs-Trop levels.  There was no significant delta.  She was seen by Cardiology.  The Troponin elevation was not felt to be significant.  An echocardiogram demonstrated lower EF at 50% with lat HK and RVSP 63.7.  Dr. Percival Spanish recommended outpatient follow up with a repeat echocardiogram to determine if she needs a cardiac catheterization.  She returns for post hospital follow up.  She is here alone.  Since discharge from the hospital, she has  done well.  She has been trying to walk more and more.  She has shortness of breath at times.  Overall, she feels her breathing is improved.  She has not had chest discomfort,  syncope, orthopnea.  She has some chronic ankle edema which is fairly stable.  She has not had melena or hematochezia.     Past Medical History:  Diagnosis Date  . Acute on chronic blood loss anemia 06/26/2015  . Acute respiratory failure with hypoxia ([place]) 02/21/2018  . Aortic atherosclerosis ([place]) 01/01/2018   CT 04/2017  . Asthma   . Atrial fibrillation ([place]) 10/13/2017   Newly diagnosed in Jan 2019 // Apixaban for anticoag // Apixaban held in 2019 for anemia but resumed; Hgb stable since restarting  . CAD (coronary artery disease) 11/27/2017   Nuc stress 3/19: anterior ischemia, ?inf scar, EF 50, intermediate risk // LHC 4/19: LAD mild dz, pLCx 35, OM2 30, mRCA 60, dRCA 65, LVEDP 15-20  . Chronic diastolic CHF (congestive heart failure) ([place]) 03/23/2018   Echo 2/19: Moderate LVH, EF 01-02, grade 2 diastolic dysfunction, trivial MR, moderate to severe LAE, PASP 30  . Chronic kidney disease (CKD), stage III (moderate) 06/27/2015  . DIVERTICULAR BLEEDING, HX OF 10/14/2007   Colonoscopy 2008 showed diverticulosis.   [name] GERD (gastroesophageal reflux disease)   . Gout   . Hyperlipemia   . Hypertension   . Proteinuria 01/11/2016  . Thoracic aortic aneurysm 04/03/2017   CT 8/18: ascending thoracic aorta 4.1 cm // unable to do CTA due to CKD // Chest MRA 05/2019: Ascending thoracic aorta 40 mm    Current Medications: Current Meds  Medication Sig  . albuterol (VENTOLIN HFA) 108 (90 Base) MCG/ACT inhaler Inhale 2 puffs into the lungs every 6 (six) hours as needed for wheezing or shortness of breath.  [name] amLODipine (NORVASC) 5 MG tablet TAKE 1 TABLET(5 MG) BY MOUTH TWICE DAILY. Please make yearly appt with Dr. Acie Fredrickson for May for future refills. 1st attempt  . apixaban (ELIQUIS) 5 MG TABS tablet Take 1 tablet (5 mg total) by mouth 2 (two) times daily. Call to schedule Cardiologist appt. Thanks  . Baclofen 5 MG TABS Take 1 tablet by mouth every 12 (twelve) hours as needed.  . calcitRIOL (ROCALTROL) 0.25  MCG capsule Take 0.25 mcg by mouth daily.   [name] Cod Liver Oil 1000 MG CAPS Take 1,000 mg by mouth daily.   [name] escitalopram (LEXAPRO) 10 MG tablet TAKE 1 TABLET(10 MG) BY MOUTH DAILY  . furosemide (LASIX) 40 MG tablet TAKE 1/2 TABLET(20 MG) BY MOUTH DAILY  . guaiFENesin (MUCINEX) 600 MG 12 hr tablet Take 1 tablet (600 mg total) by mouth 2 (two) times daily.  . hydrALAZINE (APRESOLINE) 25 MG tablet Take 1 tablet (25 mg total) by mouth every 8 (eight) hours.  [name] ipratropium-albuterol (DUONEB) 0.5-2.5 (3) MG/3ML SOLN Take 3 mLs by nebulization every 4 (four) hours as needed.  . isosorbide mononitrate (IMDUR) 30 MG 24 hr tablet Take 1 tablet (30 mg total) by mouth daily.  . Multiple Vitamin (MULTIVITAMIN WITH MINERALS) TABS tablet Take 1 tablet by mouth daily.  . nitroGLYCERIN (NITROSTAT) 0.4 MG SL tablet Place 1 tablet (0.4 mg total) under the tongue every 5 (five) minutes as needed for chest pain. If do not resolve after 1 tablet, go to ED  . potassium chloride SA (KLOR-CON) 20 MEQ tablet Take 0.5 tablets (10 mEq total) by mouth daily.  . rosuvastatin (CRESTOR) 20 MG tablet Take 1 tablet (20  mg total) by mouth at bedtime.     Allergies:   Ace inhibitors, Lisinopril, and Tramadol   Social History   Tobacco Use  . Smoking status: Former Smoker    Packs/day: 0.25    Types: Cigarettes  . Smokeless tobacco: Never Used  Substance Use Topics  . Alcohol use: Yes    Alcohol/week: 0.0 standard drinks    Comment: occasionally  . Drug use: No     Family Hx: The patient's family history includes Diabetes type II in an other family member; Kidney failure in her son and another family member.  Review of Systems  Gastrointestinal: Negative for hematochezia and melena.  Genitourinary: Negative for hematuria.     EKGs/Labs/Other Test Reviewed:    EKG:  EKG is  ordered today.  The ekg ordered today demonstrates atrial fibrillation, HR 53, left axis deviation, T wave inversions 1, aVL, V5-V6, QTC 418,  no change from prior tracing  Recent Labs: 12/26/2019: ALT 11 01/10/2020: BUN 16; Creatinine, Ser 2.31; Hemoglobin WILL FOLLOW; Platelets WILL FOLLOW; Potassium 4.0; Sodium 141   Recent Lipid Panel Lab Results  Component Value Date/Time   CHOL 155 01/12/2019 01:53 PM   TRIG 170 (H) 01/12/2019 01:53 PM   HDL 45 01/12/2019 01:53 PM   CHOLHDL 3.4 01/12/2019 01:53 PM   CHOLHDL 2.3 03/18/2016 11:20 AM   LDLCALC 76 01/12/2019 01:53 PM   LDLDIRECT 64 05/31/2019 09:57 AM   LDLDIRECT 88 07/16/2011 11:38 AM    Physical Exam:    VS:  BP (!) 150/70   Pulse (!) 53   Ht 5\' 4"  (1.626 m)   Wt 140 lb (63.5 kg)   SpO2 96%   BMI 24.03 kg/m     Wt Readings from Last 3 Encounters:  01/10/20 140 lb (63.5 kg)  12/26/19 142 lb 9.6 oz (64.7 kg)  12/06/19 141 lb (64 kg)     Constitutional:      Appearance: Healthy appearance. Not in distress.  Neck:     Vascular: JVD normal.  Pulmonary:     Effort: Pulmonary effort is normal.     Breath sounds: No wheezing. No rales.  Cardiovascular:     Bradycardia present. Irregularly irregular rhythm. Normal S1. Normal S2.     Murmurs: There is no murmur.  Edema:    Ankle: bilateral trace edema of the ankle. Abdominal:     Palpations: Abdomen is soft. There is no hepatomegaly.  Skin:    General: Skin is warm and dry.  Neurological:     Mental Status: Alert and oriented to person, place and time.     Cranial Nerves: Cranial nerves are intact.      ASSESSMENT & PLAN:    1. Chronic diastolic CHF (congestive heart failure) ([place]) Recent admission to the hospital with pneumonia.  Her echocardiogram demonstrated EF 50% with severe pulmonary hypertension.  This was felt to be related to her pneumonia.  Currently, her volume status appears stable.  She does have some ankle edema.  I have instructed her to take 40 mg of Lasix on days when her edema is worse.  2. Other secondary pulmonary hypertension ([place]) As noted, her RVSP was significantly elevated on  echocardiogram during her admission for pneumonia.  This was likely related to her acute illness.  I will arrange a follow-up echocardiogram in 6-8 weeks.  If her RVSP remains significantly elevated or her ejection fraction is depressed, we may need to consider right and left heart catheterization.  3. Coronary  artery disease involving native coronary artery of native heart without angina pectoris Moderate nonobstructive disease by cardiac catheterization in April 2019.  She is not having anginal symptoms.  She is not on aspirin as she is on Apixaban.  Continue rosuvastatin.  4. Stage 3b chronic kidney disease Recent creatinine 2.28.  Obtain repeat BMET today.  5. Paroxysmal atrial fibrillation ([facility]) She is currently in atrial fibrillation.  This has been persistent since January.  She is asymptomatic.  Rate is controlled.  She remains on anticoagulation with Apixaban.  I think it is reasonable to continue rate control strategy for now.  6. Essential hypertension Blood pressure somewhat elevated today.  She has not had her medications yet today.  We will try to provide her with a blood pressure cuff so that she can monitor her blood pressure at home.  7. Thoracic aortic aneurysm without rupture ([place]) Her aneurysm by MRA in September 2020 measured 4 cm.  She will need a follow-up again in September 2021.  8. Iron deficiency anemia due to chronic blood loss She has not had follow-up with primary care since discharge.  She does not think that she has sucralfate at home.  Recent hemoglobins were stable.  Obtain a follow-up CBC today.  I will request follow-up with her PCP in the next week.   Dispo:  Return in about 8 weeks (around 03/06/2020) for Follow up after testing, w/ Dr. Acie Fredrickson, or Richardson Dopp, PA-C, in person.   Medication Adjustments/Labs and Tests Ordered: Current medicines are reviewed at length with the patient today.  Concerns regarding medicines are outlined above.  Tests  Ordered: Orders Placed This Encounter  Procedures  . Basic metabolic panel  . CBC  . EKG 12-Lead  . ECHOCARDIOGRAM LIMITED   Medication Changes: No orders of the defined types were placed in this encounter.   Signed, Richardson Dopp, PA-C  01/10/2020 5:15 PM    [facility] [address], [place], [place]  [address] Phone: [phone]; Fax: [phone]

## 2020-01-10 ENCOUNTER — Encounter (INDEPENDENT_AMBULATORY_CARE_PROVIDER_SITE_OTHER): Payer: Self-pay

## 2020-01-10 ENCOUNTER — Other Ambulatory Visit: Payer: Self-pay

## 2020-01-10 ENCOUNTER — Encounter: Payer: Self-pay | Admitting: Physician Assistant

## 2020-01-10 ENCOUNTER — Ambulatory Visit (INDEPENDENT_AMBULATORY_CARE_PROVIDER_SITE_OTHER): Payer: Medicare HMO | Admitting: Physician Assistant

## 2020-01-10 VITALS — BP 150/70 | HR 53 | Ht 64.0 in | Wt 140.0 lb

## 2020-01-10 DIAGNOSIS — I10 Essential (primary) hypertension: Secondary | ICD-10-CM

## 2020-01-10 DIAGNOSIS — I5032 Chronic diastolic (congestive) heart failure: Secondary | ICD-10-CM

## 2020-01-10 DIAGNOSIS — I712 Thoracic aortic aneurysm, without rupture, unspecified: Secondary | ICD-10-CM

## 2020-01-10 DIAGNOSIS — N1832 Chronic kidney disease, stage 3b: Secondary | ICD-10-CM

## 2020-01-10 DIAGNOSIS — I2510 Atherosclerotic heart disease of native coronary artery without angina pectoris: Secondary | ICD-10-CM | POA: Diagnosis not present

## 2020-01-10 DIAGNOSIS — I2729 Other secondary pulmonary hypertension: Secondary | ICD-10-CM

## 2020-01-10 DIAGNOSIS — I480 Paroxysmal atrial fibrillation: Secondary | ICD-10-CM | POA: Diagnosis not present

## 2020-01-10 DIAGNOSIS — D500 Iron deficiency anemia secondary to blood loss (chronic): Secondary | ICD-10-CM | POA: Diagnosis not present

## 2020-01-10 LAB — CBC
Hematocrit: 27.5 % — ABNORMAL LOW (ref 34.0–46.6)
Hemoglobin: 8.9 g/dL — ABNORMAL LOW (ref 11.1–15.9)
MCH: 27.2 pg (ref 26.6–33.0)
MCHC: 32.4 g/dL (ref 31.5–35.7)
MCV: 84 fL (ref 79–97)
Platelets: 187 10*3/uL (ref 150–450)
RBC: 3.27 x10E6/uL — ABNORMAL LOW (ref 3.77–5.28)
RDW: 16.5 % — ABNORMAL HIGH (ref 11.7–15.4)
WBC: 4.6 10*3/uL (ref 3.4–10.8)

## 2020-01-10 LAB — BASIC METABOLIC PANEL
BUN/Creatinine Ratio: 7 — ABNORMAL LOW (ref 12–28)
BUN: 16 mg/dL (ref 8–27)
CO2: 20 mmol/L (ref 20–29)
Calcium: 9.1 mg/dL (ref 8.7–10.3)
Chloride: 109 mmol/L — ABNORMAL HIGH (ref 96–106)
Creatinine, Ser: 2.31 mg/dL — ABNORMAL HIGH (ref 0.57–1.00)
GFR calc Af Amer: 24 mL/min/{1.73_m2} — ABNORMAL LOW (ref >59)
GFR calc non Af Amer: 21 mL/min/{1.73_m2} — ABNORMAL LOW (ref >59)
Glucose: 73 mg/dL (ref 65–99)
Potassium: 4 mmol/L (ref 3.5–5.2)
Sodium: 141 mmol/L (ref 134–144)

## 2020-01-10 NOTE — Patient Instructions (Signed)
Medication Instructions:   Your physician recommends that you continue on your current medications as directed. Please refer to the Current Medication list given to you today.  *If you need a refill on your cardiac medications before your next appointment, please call your pharmacy*  Lab Work:  You will have labs drawn today: BMET/CBC  Testing/Procedures:  Your physician has requested that you have an echocardiogram in 6-8 weeks. Echocardiography is a painless test that uses sound waves to create images of your heart. It provides your doctor with information about the size and shape of your heart and how well your heart's chambers and valves are working. This procedure takes approximately one hour. There are no restrictions for this procedure.  Follow-Up: At [facility], you and your health needs are our priority.  As part of our continuing mission to provide you with exceptional heart care, we have created designated Provider Care Teams.  These Care Teams include your primary Cardiologist (physician) and Advanced Practice Providers (APPs -  Physician Assistants and Nurse Practitioners) who all work together to provide you with the care you need, when you need it.  We recommend signing up for the patient portal called "MyChart".  Sign up information is provided on this After Visit Summary.  MyChart is used to connect with patients for Virtual Visits (Telemedicine).  Patients are able to view lab/test results, encounter notes, upcoming appointments, etc.  Non-urgent messages can be sent to your provider as well.   To learn more about what you can do with MyChart, go to [email or web address].    Your next appointment:   8 week(s) after echocardiogram  The format for your next appointment:   In Person  Provider:   You may see Mertie Moores, MD or Richardson Dopp, PA-C  Other Instructions Check blood pressure once a day and bring readings to follow up visit

## 2020-01-11 ENCOUNTER — Ambulatory Visit: Payer: Medicare HMO

## 2020-01-11 NOTE — Patient Instructions (Signed)
Visit Information  Goals Addressed            This Visit's Progress   . COMPLETED: " I am all confused about the discharge papers" (pt-stated)       Current Barriers:  [name] Knowledge Deficits related to to post discharge instructions following IP event on 09/04/19 to 09/06/19 . Chronic Disease Management support and education needs related to ost discharge instructions ,including  medication review, MD follow up review.  Nurse Case Manager Clinical Goal(s):  [name] Over the next 14 days, patient will verbalize understanding of plan for post discharge instructions  Interventions:  . Evaluation of current treatment plan related to post discharge instructions following 09/04/19 to 09/06/19 and patient's adherence to plan as established by provider. . Patient states that she is doing ok but has a headache today and a little dizziness.  Advised the patient that if it persist to call the office after hours. She verbalized understanding. Any problems struggling to breath, Chest pain that will not go away new confusion or can't think clearly, fainting  call  911.  She verbalized understanding. [name] over the Heart failure action plan.  The patient does not have a scale but is going to [place] and states that she can buy one and will write the values down.  . Reviewed medications she did not have her Protonix 40 mg or Carafate 1G /10 ml she is going to pick it up at [facility] today.   Had not been taking Potassium because she said someone had told her to stop taking it.,needs to get multi vitamin.  Took one of her Eliquis 5mg  today because she did not read the discharge papers. Will start on 09/11/19 as on d/c papers  Will Send Note To [name] Pharm D to speak with the patient for medication management. Patient agreed. . Discussed plans for ongoing care management follow up  . Reviewed scheduled appointment with Dr. [name] 10/04/19 @ 930 am. Patient verbalized understanding.  . 09/20/19 . Patient states that she  is feeling much better and no more HA . Patient states that she is still having intermittent dizziness, it happens when she is in a lying or sitting position and rises to fast.  Advised the patient when she sits up or stands to remain still for a moment to gain her composure before starting to move, she verbalized understanding. . Patient states she is taking her medications as prescribed.  She needs to go get her albuterol inhaler filled. . The patient states that she is not eating any fried foods and monitoring the salt in her diet. . Reminded the patient of her appointment in February with Dr [name] . She is not weighing because she does not have a scale.  Have a scale at the office for her.  Tried to call her back but no answer.  10/26/19 o Spoke with the patient and she states that she is still not weighing because she does not have a scale.  I let her know that I have scale for her at the office waiting to be picked up.  She is going to send her husband to the office to get it. o She denies any chest pain, swelling, she states that she has shortness of breath every now and then. o She states that she is taking her medications, she is still unsure about her potassium if she is supposed to take it.  She does not take it. o I asked her about her  appointment with Dr [name] and she stated that she missed it, but she plans to get another appointment. I have asked her to clarify her medications with her PCP o Went through the HF action plan with the patient and she verbalized understanding.  Patient Self Care Activities:  . Performs ADL's independently . Performs IADL's independently . Calls provider office for new concerns or questions . Unable to independently understand post discharge instructions following IP event on 09/04/19 to 09/06/19  Please see past updates related to this goal by clicking on the "Past Updates" button in the selected goal       . I am weighing now (pt-stated)       [place] (see longtitudinal plan of care for additional care plan information)   Current Barriers:  [name] Knowledge deficit related to basic heart failure pathophysiology and self care management  Case Manager Clinical Goal(s):  [name] Over the next 90 days, patient will weigh self daily and record  Interventions:  . Provided verbal education on low sodium diet . Reviewed Heart Failure Action Plan in depth  . Advised patient to weigh each morning after emptying bladder . Discussed importance of daily weight and advised patient to weigh and record daily . Sending patient a calendar and booklet about Living better with Heart Failure . Patient states her weight today is 140 lbs . Patient states that states that she is taking her medications as prescibed . She states she has an appointment with Dr [name] on the 5/18 and plans to attend, and an appointment with with Nephrologist 5/12  Patient Self Care Activities:  . Takes Heart Failure Medications as prescribed . Adheres to low sodium diet  Initial goal documentation        Ms. Reber was given information about Care Management services today including:  1. Care Management services include personalized support from designated clinical staff supervised by her physician, including individualized plan of care and coordination with other care providers 2. 24/7 contact phone numbers for assistance for urgent and routine care needs. 3. The patient may stop CCM services at any time (effective at the end of the month) by phone call to the office staff.  Patient agreed to services and verbal consent obtained.   The patient verbalized understanding of instructions provided today and declined a print copy of patient instruction materials.   The patient has been provided with contact information for the care management team and has been advised to call with any health related questions or concerns.   Lazaro Arms RN, BSN, [facility] Care Management  Coordinator [place] Phone: [phone] Fax: [phone]

## 2020-01-11 NOTE — Chronic Care Management (AMB) (Signed)
Care Management   Follow Up Note   01/11/2020 Name: DOREATHA OFFER MRN: [id number] DOB: 1945/10/12  Referred by: [name], MD Reason for referral : Care Coordination (Care Management RNCM CHF)   ERRIKA NARVAIZ is a 74 y.o. year old female who is a primary care patient of [name], MD. The care management team was consulted for assistance with care management and care coordination needs.    Review of patient status, including review of consultants reports, relevant laboratory and other test results, and collaboration with appropriate care team members and the patient's provider was performed as part of comprehensive patient evaluation and provision of chronic care management services.    SDOH (Social Determinants of Health) assessments performed: No See Care Plan activities for detailed interventions related to [facility])     Advanced Directives: See Care Plan and Vynca application for related entries.   Goals Addressed            This Visit's Progress   . COMPLETED: " I am all confused about the discharge papers" (pt-stated)       Current Barriers:  [name] Knowledge Deficits related to to post discharge instructions following IP event on 09/04/19 to 09/06/19 . Chronic Disease Management support and education needs related to ost discharge instructions ,including  medication review, MD follow up review.  Nurse Case Manager Clinical Goal(s):  [name] Over the next 14 days, patient will verbalize understanding of plan for post discharge instructions  Interventions:  . Evaluation of current treatment plan related to post discharge instructions following 09/04/19 to 09/06/19 and patient's adherence to plan as established by provider. . Patient states that she is doing ok but has a headache today and a little dizziness.  Advised the patient that if it persist to call the office after hours. She verbalized understanding. Any problems struggling to breath, Chest pain that will not go away new  confusion or can't think clearly, fainting  call  911.  She verbalized understanding. [name] over the Heart failure action plan.  The patient does not have a scale but is going to [employer] and states that she can buy one and will write the values down.  . Reviewed medications she did not have her Protonix 40 mg or Carafate 1G /10 ml she is going to pick it up at [facility] today.   Had not been taking Potassium because she said someone had told her to stop taking it.,needs to get multi vitamin.  Took one of her Eliquis 5mg  today because she did not read the discharge papers. Will start on 09/11/19 as on d/c papers  Will Send Note To [name] Pharm D to speak with the patient for medication management. Patient agreed. . Discussed plans for ongoing care management follow up  . Reviewed scheduled appointment with Dr. [name] 10/04/19 @ 930 am. Patient verbalized understanding.  . 09/20/19 . Patient states that she is feeling much better and no more HA . Patient states that she is still having intermittent dizziness, it happens when she is in a lying or sitting position and rises to fast.  Advised the patient when she sits up or stands to remain still for a moment to gain her composure before starting to move, she verbalized understanding. . Patient states she is taking her medications as prescribed.  She needs to go get her albuterol inhaler filled. . The patient states that she is not eating any fried foods and monitoring the salt in her diet. [name]  Reminded the patient of her appointment in February with Dr [name] . She is not weighing because she does not have a scale.  Have a scale at the office for her.  Tried to call her back but no answer.  10/26/19 o Spoke with the patient and she states that she is still not weighing because she does not have a scale.  I let her know that I have scale for her at the office waiting to be picked up.  She is going to send her husband to the office to get it. o She denies  any chest pain, swelling, she states that she has shortness of breath every now and then. o She states that she is taking her medications, she is still unsure about her potassium if she is supposed to take it.  She does not take it. o I asked her about her appointment with Dr [name] and she stated that she missed it, but she plans to get another appointment. I have asked her to clarify her medications with her PCP o Went through the HF action plan with the patient and she verbalized understanding.  Patient Self Care Activities:  . Performs ADL's independently . Performs IADL's independently . Calls provider office for new concerns or questions . Unable to independently understand post discharge instructions following IP event on 09/04/19 to 09/06/19  Please see past updates related to this goal by clicking on the "Past Updates" button in the selected goal       . I am weighing now (pt-stated)       [place] (see longtitudinal plan of care for additional care plan information)   Current Barriers:  [name] Knowledge deficit related to basic heart failure pathophysiology and self care management  Case Manager Clinical Goal(s):  [name] Over the next 90 days, patient will weigh self daily and record  Interventions:  . Provided verbal education on low sodium diet . Reviewed Heart Failure Action Plan in depth  . Advised patient to weigh each morning after emptying bladder . Discussed importance of daily weight and advised patient to weigh and record daily . Sending patient a calendar and booklet about Living better with Heart Failure . Patient states her weight today is 140 lbs . Patient states that states that she is taking her medications as prescibed . She states she has an appointment with Dr [name] on the 5/18 and plans to attend, and an appointment with with Nephrologist 5/12  Patient Self Care Activities:  . Takes Heart Failure Medications as prescribed . Adheres to low sodium  diet  Initial goal documentation        Follow up with the patient in the month of May The patient has been provided with contact information for the care management team and has been advised to call with any health related questions or concerns.   Lazaro Arms RN, BSN, [facility] Care Management Coordinator [place] Phone: [phone] Fax: [phone]

## 2020-01-13 ENCOUNTER — Telehealth: Payer: Self-pay | Admitting: Physician Assistant

## 2020-01-13 NOTE — Telephone Encounter (Signed)
*  STAT* If patient is at the pharmacy, call can be transferred to refill team.   1. Which medications need to be refilled? (please list name of each medication and dose if known)   sucralfate (CARAFATE) 1 GM/10ML    2. Which pharmacy/location (including street and city if local pharmacy) is medication to be sent to? [facility] [phone] - [place], [place] - [address] AT [place]  3. Do they need a 30 day or 90 day supply? N/a   Patient states that Richardson Dopp was to call this medication in at her last OV on 01/10/20

## 2020-01-16 NOTE — Telephone Encounter (Signed)
Pt calling stating that Richardson Dopp, PA would refill pt's medication Sucralfate. This medication is not a Heart medication. Would Richardson Dopp, PA like to refill this medication? Please address

## 2020-01-16 NOTE — Telephone Encounter (Signed)
This medication should come from her PCP (or GI). She has an appt with her PCP tomorrow. She can discuss the refill with her tomorrow.  I will fwd to Dr. Helyn App, PA-C    01/16/2020 2:49 PM

## 2020-01-17 ENCOUNTER — Other Ambulatory Visit: Payer: Self-pay

## 2020-01-17 ENCOUNTER — Ambulatory Visit (INDEPENDENT_AMBULATORY_CARE_PROVIDER_SITE_OTHER): Payer: Medicare HMO | Admitting: Family Medicine

## 2020-01-17 ENCOUNTER — Encounter: Payer: Self-pay | Admitting: Family Medicine

## 2020-01-17 VITALS — BP 142/68 | HR 67 | Ht 64.0 in | Wt 132.6 lb

## 2020-01-17 DIAGNOSIS — D649 Anemia, unspecified: Secondary | ICD-10-CM | POA: Diagnosis not present

## 2020-01-17 DIAGNOSIS — D3500 Benign neoplasm of unspecified adrenal gland: Secondary | ICD-10-CM

## 2020-01-17 DIAGNOSIS — I10 Essential (primary) hypertension: Secondary | ICD-10-CM

## 2020-01-17 DIAGNOSIS — I5032 Chronic diastolic (congestive) heart failure: Secondary | ICD-10-CM

## 2020-01-17 DIAGNOSIS — I4891 Unspecified atrial fibrillation: Secondary | ICD-10-CM | POA: Diagnosis not present

## 2020-01-17 DIAGNOSIS — E278 Other specified disorders of adrenal gland: Secondary | ICD-10-CM | POA: Diagnosis not present

## 2020-01-17 DIAGNOSIS — K922 Gastrointestinal hemorrhage, unspecified: Secondary | ICD-10-CM

## 2020-01-17 DIAGNOSIS — R6889 Other general symptoms and signs: Secondary | ICD-10-CM | POA: Diagnosis not present

## 2020-01-17 MED ORDER — FAMOTIDINE 20 MG PO TABS: 20.0000 mg | ORAL_TABLET | Freq: Every day | ORAL | 1 refills | Status: DC

## 2020-01-17 MED ORDER — FUROSEMIDE 20 MG PO TABS: 20.0000 mg | ORAL_TABLET | Freq: Every day | ORAL | 1 refills | Status: DC

## 2020-01-17 MED ORDER — AMLODIPINE BESYLATE 10 MG PO TABS: 10.0000 mg | ORAL_TABLET | Freq: Every day | ORAL | 1 refills | Status: DC

## 2020-01-17 NOTE — Assessment & Plan Note (Addendum)
No acute bleed. I reviewed her recent endoscopy report as well as GI recommendations. Sucralfate was recommended only during hospitalization and GI recommended continuing PPI and d/c Sucralfate. I discussed this with her. Start Pepcid 20 mg qd instead. Med escribed.

## 2020-01-17 NOTE — Assessment & Plan Note (Signed)
Check Aldosterone and Cortisol level as well as Catecholamines. Will likely need to review with radiology regarding whether further imaging is recommended. I will follow-up with test results prior to contacting radiology.

## 2020-01-17 NOTE — Assessment & Plan Note (Signed)
No bleed. Checked anemia panel today.

## 2020-01-17 NOTE — Assessment & Plan Note (Signed)
Euvolemic. She has Lasix 40 mg bottle with her. I reminded her that she should only be on 20 mg of Lasix daily. Per Cards' note, she can take 40 mg with leg edema which she does not have now. I refilled her Lasix with 20 mg tablets instead of 40 to avoid confusion in this patient with polypharmacy. Monitor BP closely.

## 2020-01-17 NOTE — Progress Notes (Signed)
SUBJECTIVE:   CHIEF COMPLAINT / HPI:   PNA: Recent hospitalization for pneumonia and has completed her antibiotics. She denies chest pain, no cough, no SOB, or fever. She feels well in general.  Afib/CHF: S/P eval by her cardiologist. She denies SOB, no leg swelling. Her Metoprolol was held in the hospital due to bradycardia. She is compliant with Lasix 20 mg daily ( 1/2 tab of 40 mg Lasix). She complies with Eliquis 5 mg BID for her Afib. She denies bleeding.  HTN: She is currently on Norvasc 5 mg BID. Her ACE and beta-blocker were d/c during her most recent hospitalization due to AKI and bradycardia, respectfully. Hydralazine 25 mg TID was added to her regimen.  Anemia/GI bleed: Denies any bleeding. She requested a refill of Sucralfate. She is not taking her Prilosec.  Adrenal mass: No concerns. Here for f/u.   Back/Leg pain: C/O low back pain and right LL pain started a few days ago after picking poke salad. She denies recent injury to her leg or back and no recent fall. Pain improved a bit today.   PERTINENT  PMH / PSH: Pmx reviewed  OBJECTIVE:   BP (!) 142/68   Pulse 67   Ht 5\' 4"  (1.626 m)   Wt 132 lb 9.6 oz (60.1 kg)   SpO2 100%   BMI 22.76 kg/m    Physical Exam Vitals and nursing note reviewed.  Cardiovascular:     Rate and Rhythm: Normal rate. Rhythm irregular.     Heart sounds: Normal heart sounds. No murmur.  Pulmonary:     Effort: Pulmonary effort is normal. No respiratory distress.     Breath sounds: Normal breath sounds. No wheezing or rhonchi.  Abdominal:     General: Abdomen is flat. Bowel sounds are normal. There is no distension.     Palpations: Abdomen is soft. There is no mass.     Tenderness: There is no abdominal tenderness.  Musculoskeletal:     Cervical back: Neck supple.     Lumbar back: Normal.     Right lower leg: Normal. No swelling or tenderness. No edema.     Left lower leg: Normal. No swelling or tenderness. No edema.     Comments: No  erythema or swelling of both LL.      ASSESSMENT/PLAN:  Hx of PNA - CAP S/P Antibiotic therapy.  Atrial fibrillation ([place]) I reviewed the note from recent cardiology visit. They recommended rate control of her Afib although, they did not specify when to restart Metoprolol or if plan to switch to a different one given recent bradycardia in the hospital. HR today is in the 60. Since, I made some adjustment to her medications due to confusions from recent hospitalization and cards visit, I will hold off on restarting beta-blocker for now. F/U in 1 week for reassessment. Plan to switch to Coreg at a lower dose due to recent bradycardia. Continue ELiquis at current dose of 5 mg BID. She agreed with the plan.  Chronic diastolic CHF (congestive heart failure) (HCC) Euvolemic. She has Lasix 40 mg bottle with her. I reminded her that she should only be on 20 mg of Lasix daily. Per Cards' note, she can take 40 mg with leg edema which she does not have now. I refilled her Lasix with 20 mg tablets instead of 40 to avoid confusion in this patient with polypharmacy. Monitor BP closely.  Essential hypertension Multiple change in regimen with resent hospitalization leading to medication confusion. Irbesartanand  Metoprolol was help during hospitalization. She was Taking Norvasc 5 mg BID.  I advised switching to Norvasc 10 mg daily instead. Med refilled. Hydralazine 25 mg TID was started in the hospital. Also on Imdur 30 mg QD. Continue to hold Arbs and Betablocker for now since her BP looks ok on her current regimen. F/U in 1 week for reassessment and med adjustment. Clinic pharmacist spent 10 minutes reviewing and counseling her on her meds regimen. Bmet checked today.   Chronic anemia No bleed. Checked anemia panel today.  Upper GI bleed No acute bleed. I reviewed her recent endoscopy report as well as GI recommendations. Sucralfate was recommended only during hospitalization and GI  recommended continuing PPI and d/c Sucralfate. I discussed this with her. Start Pepcid 20 mg qd instead. Med escribed.  Adrenal adenoma Check Aldosterone and Cortisol level as well as Catecholamines. Will likely need to review with radiology regarding whether further imaging is recommended. I will follow-up with test results prior to contacting radiology.    MSK back and right LL pain. Exam benign. Use Tylenol as needed for pain. F/U soon.  Andrena Mews, MD [place]

## 2020-01-17 NOTE — Patient Instructions (Addendum)
Nice to see you. I have updated your medication list. I will like to see you in 1 week for your heart rate medication. Discontinue Sucralfate and start Pepcid for your stomach. Your Norvasc is now 10 mg daily. I have escribed your medications. Please schedule follow-up in 1 week.

## 2020-01-17 NOTE — Assessment & Plan Note (Addendum)
I reviewed the note from recent cardiology visit. They recommended rate control of her Afib although, they did not specify when to restart Metoprolol or if plan to switch to a different one given recent bradycardia in the hospital. HR today is in the 60. Since, I made some adjustment to her medications due to confusions from recent hospitalization and cards visit, I will hold off on restarting beta-blocker for now. F/U in 1 week for reassessment. Plan to switch to Coreg at a lower dose due to recent bradycardia. Continue ELiquis at current dose of 5 mg BID. She agreed with the plan.

## 2020-01-17 NOTE — Assessment & Plan Note (Addendum)
Multiple change in regimen with resent hospitalization leading to medication confusion. Irbesartanand Metoprolol was help during hospitalization. She was Taking Norvasc 5 mg BID.  I advised switching to Norvasc 10 mg daily instead. Med refilled. Hydralazine 25 mg TID was started in the hospital. Also on Imdur 30 mg QD. Continue to hold Arbs and Betablocker for now since her BP looks ok on her current regimen. F/U in 1 week for reassessment and med adjustment. Clinic pharmacist spent 10 minutes reviewing and counseling her on her meds regimen. Bmet checked today.

## 2020-01-18 ENCOUNTER — Telehealth: Payer: Self-pay | Admitting: Family Medicine

## 2020-01-18 DIAGNOSIS — D631 Anemia in chronic kidney disease: Secondary | ICD-10-CM | POA: Diagnosis not present

## 2020-01-18 DIAGNOSIS — N2581 Secondary hyperparathyroidism of renal origin: Secondary | ICD-10-CM | POA: Diagnosis not present

## 2020-01-18 DIAGNOSIS — N184 Chronic kidney disease, stage 4 (severe): Secondary | ICD-10-CM | POA: Diagnosis not present

## 2020-01-18 DIAGNOSIS — N189 Chronic kidney disease, unspecified: Secondary | ICD-10-CM | POA: Diagnosis not present

## 2020-01-18 DIAGNOSIS — I129 Hypertensive chronic kidney disease with stage 1 through stage 4 chronic kidney disease, or unspecified chronic kidney disease: Secondary | ICD-10-CM | POA: Diagnosis not present

## 2020-01-18 LAB — CORTISOL-AM, BLOOD: Cortisol - AM: 15.6 ug/dL (ref 6.2–19.4)

## 2020-01-18 NOTE — Telephone Encounter (Signed)
Test result discussed with the patient.   Hemoglobin improved - anemia due to CKD.  Creatinine a bit up but close to her baseline. She saw nephrologist today. No changes were made to her regimen.  She is to obtain nephrology visit report for our record.   F/U as planned.  Other test results for adrenal mass are pending.

## 2020-01-24 ENCOUNTER — Encounter: Payer: Self-pay | Admitting: Family Medicine

## 2020-01-24 ENCOUNTER — Other Ambulatory Visit: Payer: Self-pay

## 2020-01-24 ENCOUNTER — Ambulatory Visit: Payer: Medicare HMO | Admitting: Family Medicine

## 2020-01-24 ENCOUNTER — Ambulatory Visit (INDEPENDENT_AMBULATORY_CARE_PROVIDER_SITE_OTHER): Payer: Medicare HMO | Admitting: Family Medicine

## 2020-01-24 DIAGNOSIS — R634 Abnormal weight loss: Secondary | ICD-10-CM

## 2020-01-24 DIAGNOSIS — I4821 Permanent atrial fibrillation: Secondary | ICD-10-CM | POA: Diagnosis not present

## 2020-01-24 DIAGNOSIS — E876 Hypokalemia: Secondary | ICD-10-CM | POA: Diagnosis not present

## 2020-01-24 DIAGNOSIS — D6869 Other thrombophilia: Secondary | ICD-10-CM

## 2020-01-24 DIAGNOSIS — F1411 Cocaine abuse, in remission: Secondary | ICD-10-CM

## 2020-01-24 DIAGNOSIS — M79659 Pain in unspecified thigh: Secondary | ICD-10-CM | POA: Insufficient documentation

## 2020-01-24 DIAGNOSIS — M79651 Pain in right thigh: Secondary | ICD-10-CM

## 2020-01-24 DIAGNOSIS — I10 Essential (primary) hypertension: Secondary | ICD-10-CM

## 2020-01-24 LAB — ANEMIA PROFILE B
Basophils Absolute: 0 10*3/uL (ref 0.0–0.2)
Basos: 1 %
EOS (ABSOLUTE): 0.1 10*3/uL (ref 0.0–0.4)
Eos: 2 %
Ferritin: 154 ng/mL — ABNORMAL HIGH (ref 15–150)
Folate: 12.5 ng/mL (ref >3.0)
Hematocrit: 30.1 % — ABNORMAL LOW (ref 34.0–46.6)
Hemoglobin: 9.6 g/dL — ABNORMAL LOW (ref 11.1–15.9)
Immature Grans (Abs): 0 10*3/uL (ref 0.0–0.1)
Immature Granulocytes: 1 %
Iron Saturation: 34 % (ref 15–55)
Iron: 78 ug/dL (ref 27–139)
Lymphocytes Absolute: 0.8 10*3/uL (ref 0.7–3.1)
Lymphs: 21 %
MCH: 27.1 pg (ref 26.6–33.0)
MCHC: 31.9 g/dL (ref 31.5–35.7)
MCV: 85 fL (ref 79–97)
Monocytes Absolute: 0.5 10*3/uL (ref 0.1–0.9)
Monocytes: 13 %
Neutrophils Absolute: 2.6 10*3/uL (ref 1.4–7.0)
Neutrophils: 62 %
Platelets: 152 10*3/uL (ref 150–450)
RBC: 3.54 x10E6/uL — ABNORMAL LOW (ref 3.77–5.28)
RDW: 15.9 % — ABNORMAL HIGH (ref 11.7–15.4)
Retic Ct Pct: 1.5 % (ref 0.6–2.6)
Total Iron Binding Capacity: 229 ug/dL — ABNORMAL LOW (ref 250–450)
UIBC: 151 ug/dL (ref 118–369)
Vitamin B-12: 462 pg/mL (ref 232–1245)
WBC: 4.1 10*3/uL (ref 3.4–10.8)

## 2020-01-24 LAB — ALDOSTERONE + RENIN ACTIVITY W/ RATIO
ALDOS/RENIN RATIO: 14.7 (ref 0.0–30.0)
ALDOSTERONE: 8.1 ng/dL (ref 0.0–30.0)
Renin: 0.55 ng/mL/hr (ref 0.167–5.380)

## 2020-01-24 LAB — CATECHOLAMINES, FRACTIONATED, PLASMA
Dopamine: <30 pg/mL (ref 0–48)
Epinephrine: <15 pg/mL (ref 0–62)
Norepinephrine: 606 pg/mL (ref 0–874)

## 2020-01-24 LAB — BASIC METABOLIC PANEL
BUN/Creatinine Ratio: 8 — ABNORMAL LOW (ref 12–28)
BUN: 21 mg/dL (ref 8–27)
CO2: 21 mmol/L (ref 20–29)
Calcium: 9.5 mg/dL (ref 8.7–10.3)
Chloride: 106 mmol/L (ref 96–106)
Creatinine, Ser: 2.54 mg/dL — ABNORMAL HIGH (ref 0.57–1.00)
GFR calc Af Amer: 21 mL/min/{1.73_m2} — ABNORMAL LOW (ref >59)
GFR calc non Af Amer: 18 mL/min/{1.73_m2} — ABNORMAL LOW (ref >59)
Glucose: 66 mg/dL (ref 65–99)
Potassium: 3.5 mmol/L (ref 3.5–5.2)
Sodium: 140 mmol/L (ref 134–144)

## 2020-01-24 NOTE — Assessment & Plan Note (Signed)
Exam benign. Likely MSK. May use Baclofen as needed. Monitor closely for improvement.

## 2020-01-24 NOTE — Assessment & Plan Note (Signed)
Unfortunately, she is non-adherence to med as instructed from her last visit. I called her pharmacy while she was in the office, and they confirmed that her Norvasc and Lasix are ready for pick up. She will pick all meds up and start all meds today as instructed. Return in 1 week for reassessment.

## 2020-01-24 NOTE — Assessment & Plan Note (Signed)
Compliant with Eliquis. I was unable to add on Betablocker today as planned since she has been non-adherent to other antihypertensive agents. She will return in 1 week for BP check. If not too low, then we will get her on a betablocker. She agreed with the plan.

## 2020-01-24 NOTE — Assessment & Plan Note (Signed)
Clean x 5 years or more. I commended her on effort made.

## 2020-01-24 NOTE — Assessment & Plan Note (Signed)
She is up to date with cancer screens. Recent MRA chest showed normal lung parenchyma. She is scheduled for another chest MRI for the next 1-2 months, although for Aortic Aneurysm. Should evaluate also the lung parenchyma. We will readdress weight loss and consider Remeron at her next appointment. She agreed with the plan.

## 2020-01-24 NOTE — Progress Notes (Signed)
SUBJECTIVE:   CHIEF COMPLAINT / HPI:   HTN/Afib:Compliant with Eliquis. She did not take any of her antihypertensive agents today. She is also yet to pick up her Lasix and Norvasc from the pharmacy, hence, she has not taken both medications in about a week. She denies any concerns today. She feels well in general.  Thigh pain: Right thigh still hurt. Pain has not worsened. She uses Icy hot as needed with some improvement. No recent injury, no swelling or redness.  Weight loss: She is not gaining weight despite adequate. She wants something for weight gain.  Potassium: Taking Potassium 10 mg BID.   Cocaine abuse: Not use for more than 5 years  PERTINENT  PMH / PSH: PMX reviewed.  OBJECTIVE:   BP (!) 160/82   Pulse 79   Ht 5\' 4"  (1.626 m)   Wt 128 lb 9.6 oz (58.3 kg)   SpO2 99%   BMI 22.07 kg/m    Physical Exam Vitals and nursing note reviewed.  Constitutional:      Appearance: Normal appearance.  Cardiovascular:     Rate and Rhythm: Normal rate and regular rhythm.     Pulses: Normal pulses.     Heart sounds: Normal heart sounds. No murmur.  Pulmonary:     Effort: Pulmonary effort is normal. No respiratory distress.     Breath sounds: Normal breath sounds. No wheezing or rhonchi.  Abdominal:     General: Abdomen is flat. Bowel sounds are normal. There is no distension.     Tenderness: There is no abdominal tenderness.  Musculoskeletal:     Right lower leg: No edema.     Left lower leg: No edema.     Comments: No tenderness, erythema or swelling of her right thigh  Neurological:     Mental Status: She is alert.      ASSESSMENT/PLAN:   Essential hypertension Unfortunately, she is non-adherence to med as instructed from her last visit. I called her pharmacy while she was in the office, and they confirmed that her Norvasc and Lasix are ready for pick up. She will pick all meds up and start all meds today as instructed. Return in 1 week for reassessment.   Atrial fibrillation ([place]) Compliant with Eliquis. I was unable to add on Betablocker today as planned since she has been non-adherent to other antihypertensive agents. She will return in 1 week for BP check. If not too low, then we will get her on a betablocker. She agreed with the plan.  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 On Eliquis for clot prevention.  Thigh pain Exam benign. Likely MSK. May use Baclofen as needed. Monitor closely for improvement.  Weight loss She is up to date with cancer screens. Recent MRA chest showed normal lung parenchyma. She is scheduled for another chest MRI for the next 1-2 months, although for Aortic Aneurysm. Should evaluate also the lung parenchyma. We will readdress weight loss and consider Remeron at her next appointment. She agreed with the plan.  Hypokalemia I again reviewed her hospital discharge summary note. Completed medication reconciliation today. Recent lab reviewed with normal potassium level. According to her D/C summary and medication bottle instruction, she is to take Kdur 10 mEQ day day and not 10 BID. I instructed her on the recommended dosage. She verbalized understanding.  History of cocaine abuse ([place]) Clean x 5 years or more. I commended her on effort made.      Andrena Mews, MD [place]  [place]

## 2020-01-24 NOTE — Assessment & Plan Note (Signed)
Secondary to Afib. On Eliquis for clot prevention.

## 2020-01-24 NOTE — Patient Instructions (Addendum)
It was nice seeing you today. Please obtain all medications from the pharmacist and start taking them. I will see you back in 1 week.

## 2020-01-24 NOTE — Assessment & Plan Note (Signed)
I again reviewed her hospital discharge summary note. Completed medication reconciliation today. Recent lab reviewed with normal potassium level. According to her D/C summary and medication bottle instruction, she is to take Kdur 10 mEQ day day and not 10 BID. I instructed her on the recommended dosage. She verbalized understanding.

## 2020-01-26 ENCOUNTER — Ambulatory Visit: Payer: Medicare HMO

## 2020-01-26 ENCOUNTER — Other Ambulatory Visit: Payer: Self-pay

## 2020-02-03 ENCOUNTER — Ambulatory Visit: Payer: Medicare HMO | Admitting: Family Medicine

## 2020-02-05 ENCOUNTER — Other Ambulatory Visit: Payer: Self-pay | Admitting: Physician Assistant

## 2020-02-07 NOTE — Chronic Care Management (AMB) (Signed)
Care Management   Follow Up Note   02/07/2020 Name: Jennifer Foster MRN: [id number] DOB: [birth year]  Referred by: Doreene Eland, MD Reason for referral : Care Coordination (Care Management RNCM )   Jennifer Foster is a 74 y.o. year old female who is a primary care patient of Doreene Eland, MD. The care management team was consulted for assistance with care management and care coordination needs.    Review of patient status, including review of consultants reports, relevant laboratory and other test results, and collaboration with appropriate care team members and the patient's provider was performed as part of comprehensive patient evaluation and provision of chronic care management services.    SDOH (Social Determinants of Health) assessments performed: No See Care Plan activities for detailed interventions related to [facility])     Advanced Directives: See Care Plan and Vynca application for related entries.   Goals Addressed            This Visit's Progress   . I am weighing now (pt-stated)       CARE PLAN ENTRY (see longtitudinal plan of care for additional care plan information)   Current Barriers:  [name] Knowledge deficit related to basic heart failure pathophysiology and self care management  Case Manager Clinical Goal(s):  [name] Over the next 90 days, patient will weigh self daily and record  Interventions:  . Provided verbal education on low sodium diet . Reviewed Heart Failure Action Plan in depth  . Advised patient to weigh each morning after emptying bladder . Discussed importance of daily weight and advised patient to weigh and record daily . 01/26/20 . Patient states that she is not weighing she forgets to weigh . She states that she has a lot on her mind a sister is the hospital and one of her friends . The last time she weighed she thinks it was around 147 lbs. . She states that she is monitoring the salt and taking her meds as prescribed. . She denies any chest  pain, shortness of breath and swelling .   Patient Self Care Activities:  . Takes Heart Failure Medications as prescribed . Adheres to low sodium diet  Initial goal documentation         RNCM will follow up with the patient within the next month of  June  Layanna Charo RN, BSN, [facility] Care Management Coordinator [facility] Phone: [phone] Fax: [phone]

## 2020-02-07 NOTE — Patient Instructions (Signed)
Visit Information  Goals Addressed            This Visit's Progress   . I am weighing now (pt-stated)       CARE PLAN ENTRY (see longtitudinal plan of care for additional care plan information)   Current Barriers:  [name] Knowledge deficit related to basic heart failure pathophysiology and self care management  Case Manager Clinical Goal(s):  [name] Over the next 90 days, patient will weigh self daily and record  Interventions:  . Provided verbal education on low sodium diet . Reviewed Heart Failure Action Plan in depth  . Advised patient to weigh each morning after emptying bladder . Discussed importance of daily weight and advised patient to weigh and record daily . 01/26/20 . Patient states that she is not weighing she forgets to weigh . She states that she has a lot on her mind a sister is the hospital and one of her friends . The last time she weighed she thinks it was around 147 lbs. . She states that she is monitoring the salt and taking her meds as prescribed. . She denies any chest pain, shortness of breath and swelling .   Patient Self Care Activities:  . Takes Heart Failure Medications as prescribed . Adheres to low sodium diet  Initial goal documentation        Ms. Radu was given information about Care Management services today including:  1. Care Management services include personalized support from designated clinical staff supervised by her physician, including individualized plan of care and coordination with other care providers 2. 24/7 contact phone numbers for assistance for urgent and routine care needs. 3. The patient may stop CCM services at any time (effective at the end of the month) by phone call to the office staff.  Patient agreed to services and verbal consent obtained.   The patient verbalized understanding of instructions provided today and declined a print copy of patient instruction materials.   RNCM will follow up with the patient within the  next month of  June   Latesia Norrington RN, BSN, [facility] Care Management Coordinator [place] Phone: [phone] Fax: [phone]

## 2020-02-21 ENCOUNTER — Other Ambulatory Visit (HOSPITAL_COMMUNITY): Payer: Medicare HMO

## 2020-02-22 ENCOUNTER — Ambulatory Visit: Payer: Medicare HMO

## 2020-02-22 ENCOUNTER — Other Ambulatory Visit: Payer: Self-pay

## 2020-02-22 NOTE — Chronic Care Management (AMB) (Signed)
  Care Management   Outreach Note  02/22/2020 Name: RAYANA GEURIN MRN: [id number] DOB: 06/08/1946  Referred by: [name], MD Reason for referral : Care Coordination (Care management RNCM HTN CHF)   An unsuccessful telephone outreach was attempted today. The patient was referred to the case management team for assistance with care management and care coordination.   Follow Up Plan: The care management team will reach out to the patient again over the next 7-14 days.   Called the number twice and both times the phone was answered and it was hung up.  Lazaro Arms RN, BSN, [facility] Care Management Coordinator [place] Phone: [phone] Fax: [phone]

## 2020-02-24 ENCOUNTER — Encounter (HOSPITAL_COMMUNITY): Payer: Self-pay | Admitting: Physician Assistant

## 2020-03-12 ENCOUNTER — Ambulatory Visit: Payer: Medicare HMO

## 2020-03-12 ENCOUNTER — Other Ambulatory Visit: Payer: Self-pay

## 2020-03-12 NOTE — Chronic Care Management (AMB) (Signed)
  Care Management   Follow Up Note   03/12/2020 Name: Jennifer Foster MRN: [id number] DOB: 20-Mar-1946  Referred by: [name], MD Reason for referral : Chronic Care Management (HTN CHF)   Jennifer Foster is a 74 y.o. year old female who is a primary care patient of [name], MD. The care management team was consulted for assistance with care management and care coordination needs.    Review of patient status, including review of consultants reports, relevant laboratory and other test results, and collaboration with appropriate care team members and the patient's provider was performed as part of comprehensive patient evaluation and provision of chronic care management services.    SDOH (Social Determinants of Health) assessments performed: No See Care Plan activities for detailed interventions related to [place])     Advanced Directives: See Care Plan and Vynca application for related entries.   Goals Addressed              This Visit's Progress   .  I am weighing now (pt-stated)        CARE PLAN ENTRY (see longtitudinal plan of care for additional care plan information)   Current Barriers:  [name] Knowledge deficit related to basic heart failure pathophysiology and self care management  Case Manager Clinical Goal(s):  [name] Over the next 90 days, patient will weigh self daily and record  Interventions:  . Provided verbal education on low sodium diet . Reviewed Heart Failure Action Plan in depth  . Advised patient to weigh each morning after emptying bladder . Discussed importance of daily weight and advised patient to weigh and record daily . 03/12/20 . Spoke with the patient on the phone and she states that she has not been weighing or checking here blood pressure.  She denies any swelling chest pain or shortness of breath.  She states that she has been taking her medications as prescribed.  She state that she has bee having some stress with her son  and some family  that has been sick.  I explained to her the importance of checking her blood pressure and her weight.   . Advised the patient that I am sending her some information for relaxation techniques. Advised her that I will follow up in 2 weeks to check on her.   Patient Self Care Activities:  . Takes Heart Failure Medications as prescribed . Adheres to low sodium diet  Please see past updates related to this goal by clicking on the "Past Updates" button in the selected goal          The care management team will reach out to the patient again over the next 14 days.   Lazaro Arms RN, BSN, [facility] Care Management Coordinator [place] Phone: [phone] Fax: [phone]

## 2020-03-12 NOTE — Patient Instructions (Signed)
Visit Information  Goals Addressed              This Visit's Progress   .  I am weighing now (pt-stated)        CARE PLAN ENTRY (see longtitudinal plan of care for additional care plan information)   Current Barriers:  [name] Knowledge deficit related to basic heart failure pathophysiology and self care management  Case Manager Clinical Goal(s):  [name] Over the next 90 days, patient will weigh self daily and record  Interventions:  . Provided verbal education on low sodium diet . Reviewed Heart Failure Action Plan in depth  . Advised patient to weigh each morning after emptying bladder . Discussed importance of daily weight and advised patient to weigh and record daily . 03/12/20 . Spoke with the patient on the phone and she states that she has not been weighing or checking here blood pressure.  She denies any swelling chest pain or shortness of breath.  She states that she has been taking her medications as prescribed.  She state that she has bee having some stress with her son  and some family that has been sick.  I explained to her the importance of checking her blood pressure and her weight.   . Advised the patient that I am sending her some information for relaxation techniques. Advised her that I will follow up in 2 weeks to check on her.   Patient Self Care Activities:  . Takes Heart Failure Medications as prescribed . Adheres to low sodium diet  Please see past updates related to this goal by clicking on the "Past Updates" button in the selected goal         Ms. Milberger was given information about Care Management services today including:  1. Care Management services include personalized support from designated clinical staff supervised by her physician, including individualized plan of care and coordination with other care providers 2. 24/7 contact phone numbers for assistance for urgent and routine care needs. 3. The patient may stop CCM services at any time (effective at the  end of the month) by phone call to the office staff.  Patient agreed to services and verbal consent obtained.   The patient verbalized understanding of instructions provided today and declined a print copy of patient instruction materials.   The care management team will reach out to the patient again over the next 14 days.   Lazaro Arms RN, BSN, [facility] Care Management Coordinator [place] Phone: [phone] Fax: [phone]

## 2020-03-23 ENCOUNTER — Encounter: Payer: Self-pay | Admitting: Physician Assistant

## 2020-03-23 ENCOUNTER — Ambulatory Visit (HOSPITAL_COMMUNITY): Payer: Medicare HMO | Attending: Cardiology

## 2020-03-23 ENCOUNTER — Other Ambulatory Visit: Payer: Self-pay

## 2020-03-23 DIAGNOSIS — I2729 Other secondary pulmonary hypertension: Secondary | ICD-10-CM | POA: Insufficient documentation

## 2020-03-23 LAB — ECHOCARDIOGRAM LIMITED: S' Lateral: 3.2 cm

## 2020-03-26 ENCOUNTER — Other Ambulatory Visit: Payer: Self-pay

## 2020-03-26 ENCOUNTER — Ambulatory Visit: Payer: Medicare HMO

## 2020-03-26 DIAGNOSIS — Z789 Other specified health status: Secondary | ICD-10-CM

## 2020-03-26 DIAGNOSIS — Z139 Encounter for screening, unspecified: Secondary | ICD-10-CM

## 2020-03-26 DIAGNOSIS — Z658 Other specified problems related to psychosocial circumstances: Secondary | ICD-10-CM

## 2020-03-26 NOTE — Chronic Care Management (AMB) (Signed)
  Care Management   Follow Up Note   03/26/2020 Name: Jennifer Foster MRN: [id number] DOB: [birth year]  Referred by: [name], MD Reason for referral : No chief complaint on file.   Jennifer Foster is a 74 y.o. year old female who is a primary care patient of [place], Phill Myron, MD. The care management team was consulted for assistance with care management and care coordination needs.    Review of patient status, including review of consultants reports, relevant laboratory and other test results, and collaboration with appropriate care team members and the patient's provider was performed as part of comprehensive patient evaluation and provision of chronic care management services.    SDOH (Social Determinants of Health) assessments performed: No See Care Plan activities for detailed interventions related to SDOH)  SDOH Interventions     Most Recent Value  SDOH Interventions  Depression Interventions/Treatment  Counseling  [patient states that she is open to counseling]       Advanced Directives: See Care Plan and Vynca application for related entries.   Goals Addressed              This Visit's Progress   .  I am weighing now (pt-stated)        CARE PLAN ENTRY (see longtitudinal plan of care for additional care plan information)   Current Barriers:  [name] Knowledge deficit related to basic heart failure pathophysiology and self care management  Case Manager Clinical Goal(s):  [name] Over the next 90 days, patient will weigh self daily and record  Interventions:  . Provided verbal education on low sodium diet . Reviewed Heart Failure Action Plan in depth  . Advised patient to weigh each morning after emptying bladder . Discussed importance of daily weight and advised patient to weigh and record daily . 03/26/30 . Spoke with the patient on the phone and she states that she has not been weighing at her sisters house.  She is not sure that the scale that she has is correct.   The scale at there sisters is saying she weighs 109 lbs.  She feels she is losing a lot of weight due to worring about her grandson who is living with her.  She states that she has been depressed and losing weight.  RNCM scheduled her an appointment to see Dr [name] 04-06-20 @ 850 am. . She states that she has been checking her BP and it has been good but she was unable to give me any values because she has not been writing them down and could not remember. . Patient is taking her medications.  She states that she is still waiting on her hydralazine 25 mg prescription from the pharmacy. . Referral placed for counseling to care guides. . Will collaborate with Casimer Lanius LCSW  Patient Self Care Activities:  . Takes Heart Failure Medications as prescribed . Adheres to low sodium diet  Please see past updates related to this goal by clicking on the "Past Updates" button in the selected goal          The care management team will reach out to the patient again over the next 14 days.   Lazaro Arms RN, BSN, [facility] Care Management Coordinator [place] Phone: [phone] Fax: [phone]

## 2020-03-26 NOTE — Patient Instructions (Signed)
Visit Information  Goals Addressed              This Visit's Progress   .  I am weighing now (pt-stated)        CARE PLAN ENTRY (see longtitudinal plan of care for additional care plan information)   Current Barriers:  [name] Knowledge deficit related to basic heart failure pathophysiology and self care management  Case Manager Clinical Goal(s):  [name] Over the next 90 days, patient will weigh self daily and record  Interventions:  . Provided verbal education on low sodium diet . Reviewed Heart Failure Action Plan in depth  . Advised patient to weigh each morning after emptying bladder . Discussed importance of daily weight and advised patient to weigh and record daily . 03/26/30 . Spoke with the patient on the phone and she states that she has not been weighing at her sisters house.  She is not sure that the scale that she has is correct.  The scale at there sisters is saying she weighs 109 lbs.  She feels she is losing a lot of weight due to worring about her grandson who is living with her.  She states that she has been depressed and losing weight.  RNCM scheduled her an appointment to see Dr [name] 04-06-20 @ 850 am. . She states that she has been checking her BP and it has been good but she was unable to give me any values because she has not been writing them down and could not remember. . Patient is taking her medications.  She states that she is still waiting on her hydralazine 25 mg prescription from the pharmacy. . Referral placed for counseling to care guides. . Will collaborate with Casimer Lanius LCSW  Patient Self Care Activities:  . Takes Heart Failure Medications as prescribed . Adheres to low sodium diet  Please see past updates related to this goal by clicking on the "Past Updates" button in the selected goal         Ms. Niese was given information about Care Management services today including:  1. Care Management services include personalized support from designated  clinical staff supervised by her physician, including individualized plan of care and coordination with other care providers 2. 24/7 contact phone numbers for assistance for urgent and routine care needs. 3. The patient may stop CCM services at any time (effective at the end of the month) by phone call to the office staff.  Patient agreed to services and verbal consent obtained.   The patient verbalized understanding of instructions provided today and declined a print copy of patient instruction materials.   The care management team will reach out to the patient again over the next 14 days.   Lazaro Arms RN, BSN, [facility] Care Management Coordinator [place] Phone: [phone] Fax: [phone]

## 2020-03-29 ENCOUNTER — Other Ambulatory Visit: Payer: Self-pay | Admitting: Cardiovascular Disease

## 2020-03-29 ENCOUNTER — Other Ambulatory Visit: Payer: Self-pay | Admitting: Family Medicine

## 2020-03-29 NOTE — Telephone Encounter (Signed)
Pt last saw Richardson Dopp, PA on 01/10/20, last labs 01/17/20 Creat 2.54, age 74, weight 58.3kg, based on specified criteria weight less than 60kg, and serum creatinine greater than 1.5, pt is not on appropriate dosage of Eliquis.  Will route message to Dr Acie Fredrickson to see if dosage change appropriate. Will await response.

## 2020-03-30 NOTE — Telephone Encounter (Signed)
Per Dr. Acie Fredrickson- ok to reduce dose to 2.5mg  BID. I tried calling patient to explain that dose will be decreased but phone rang and no VM ever picked up. Will try again

## 2020-04-02 ENCOUNTER — Telehealth: Payer: Self-pay | Admitting: Physician Assistant

## 2020-04-02 NOTE — Telephone Encounter (Signed)
Called pt to inform her that her medication amlodipine and Eliquis is already at her pharmacy, ready to be picked up, but she will have to contact her PCP for a refill on her hydralazine. Pt verbalized understanding.

## 2020-04-02 NOTE — Telephone Encounter (Signed)
*  STAT* If patient is at the pharmacy, call can be transferred to refill team.   1. Which medications need to be refilled? (please list name of each medication and dose if known)  apixaban (ELIQUIS) 2.5 MG TABS tablet amLODipine (NORVASC) 10 MG tablet hydrALAZINE (APRESOLINE) 25 MG tablet  2. Which pharmacy/location (including street and city if local pharmacy) is medication to be sent to?  [facility] [phone] - [place], [place] - [address] AT [place]  3. Do they need a 30 day or 90 day supply? 30  Patient is not sure how she ran out of these two medications before her others. She will not have enough to last her until her appointment with [facility] 04/18/20.

## 2020-04-04 NOTE — Telephone Encounter (Signed)
I was able to call and speak with patient about her Eliquis dose and why it was decreased. Patient appreciated the call.

## 2020-04-05 ENCOUNTER — Other Ambulatory Visit: Payer: Self-pay | Admitting: Physician Assistant

## 2020-04-05 NOTE — Telephone Encounter (Signed)
Late note entry from 01/24/20 - please see result note:  Likely benign, incidental findings on CT chest. Also adrenal hyperplasia. I called and spoke with the radiologist - Dr. Kris Hartmann, Jenny Reichmann who reviewed the report with me. Similar findings on her 2018 CT scan. Although size seems to have reduced from 1/9 cm in 2018 to 1.3 in 2021 per report, Dr. Kris Hartmann feels it's the same size.  However, he affirm that this is a benign lesion and no further work up is warranted. All labs related to adrenal were normal. Will not pursue further eval unless she has new symptoms. I will discuss with her at her next appointment.

## 2020-04-06 ENCOUNTER — Other Ambulatory Visit: Payer: Self-pay

## 2020-04-06 ENCOUNTER — Encounter: Payer: Self-pay | Admitting: Family Medicine

## 2020-04-06 ENCOUNTER — Ambulatory Visit (INDEPENDENT_AMBULATORY_CARE_PROVIDER_SITE_OTHER): Payer: Medicare HMO | Admitting: Family Medicine

## 2020-04-06 DIAGNOSIS — I10 Essential (primary) hypertension: Secondary | ICD-10-CM | POA: Diagnosis not present

## 2020-04-06 DIAGNOSIS — F329 Major depressive disorder, single episode, unspecified: Secondary | ICD-10-CM | POA: Diagnosis not present

## 2020-04-06 DIAGNOSIS — F419 Anxiety disorder, unspecified: Secondary | ICD-10-CM | POA: Diagnosis not present

## 2020-04-06 DIAGNOSIS — R634 Abnormal weight loss: Secondary | ICD-10-CM | POA: Diagnosis not present

## 2020-04-06 MED ORDER — MIRTAZAPINE 15 MG PO TABS: 15.0000 mg | ORAL_TABLET | Freq: Every day | ORAL | 1 refills | Status: DC

## 2020-04-06 NOTE — Assessment & Plan Note (Signed)
BP looks good. All meds reviewed. She does not have Hydralazine bottle. Since she is doing well off this med, I recommended discontinuing it altogether.
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
## 2020-04-06 NOTE — Assessment & Plan Note (Signed)
Office Visit from 04/06/2020 in [place]  PHQ-9 Total Score 1     Continue Lexapro at current dose. Remeron added and escribed today mostly for appetite stimulation. If she does well on Remeron, we will taper off her Lexapro. F/U in 2-4 weeks for reassessment.

## 2020-04-06 NOTE — Assessment & Plan Note (Signed)
She lost 5 lbs since her last visit 3 months ago despite not trying to lose weight. Likely nutritional, I.e inadequate intake. She is up to date with her cancer screens. Continue ensure daily in addition to three square meal. Start Remeron to stimulate her appetite and hopefully improve her intake.  This will also help with her depression.  F/U in 2-4 weeks for reassessment.

## 2020-04-06 NOTE — Patient Instructions (Signed)
Mirtazapine tablets What is this medicine? MIRTAZAPINE (mir TAZ a peen) is used to treat depression. This medicine may be used for other purposes; ask your health care provider or pharmacist if you have questions. COMMON BRAND NAME(S): Remeron What should I tell my health care provider before I take this medicine? They need to know if you have any of these conditions:  bipolar disorder  glaucoma  kidney disease  liver disease  suicidal thoughts  an unusual or allergic reaction to mirtazapine, other medicines, foods, dyes, or preservatives  pregnant or trying to get pregnant  breast-feeding How should I use this medicine? Take this medicine by mouth with a glass of water. Follow the directions on the prescription label. Take your medicine at regular intervals. Do not take your medicine more often than directed. Do not stop taking this medicine suddenly except upon the advice of your doctor. Stopping this medicine too quickly may cause serious side effects or your condition may worsen. A special MedGuide will be given to you by the pharmacist with each prescription and refill. Be sure to read this information carefully each time. Talk to your pediatrician regarding the use of this medicine in children. Special care may be needed. Overdosage: If you think you have taken too much of this medicine contact a poison control center or emergency room at once. NOTE: This medicine is only for you. Do not share this medicine with others. What if I miss a dose? If you miss a dose, take it as soon as you can. If it is almost time for your next dose, take only that dose. Do not take double or extra doses. What may interact with this medicine? Do not take this medicine with any of the following medications:  linezolid  MAOIs like Carbex, Eldepryl, Marplan, Nardil, and Parnate  methylene blue (injected into a vein) This medicine may also interact with the following  medications:  alcohol  antiviral medicines for HIV or AIDS  certain medicines that treat or prevent blood clots like warfarin  certain medicines for depression, anxiety, or psychotic disturbances  certain medicines for fungal infections like ketoconazole and itraconazole  certain medicines for migraine headache like almotriptan, eletriptan, frovatriptan, naratriptan, rizatriptan, sumatriptan, zolmitriptan  certain medicines for seizures like carbamazepine or phenytoin  certain medicines for sleep  cimetidine  erythromycin  fentanyl  lithium  medicines for blood pressure  nefazodone  rasagiline  rifampin  supplements like St. John's wort, kava kava, valerian  tramadol  tryptophan This list may not describe all possible interactions. Give your health care provider a list of all the medicines, herbs, non-prescription drugs, or dietary supplements you use. Also tell them if you smoke, drink alcohol, or use illegal drugs. Some items may interact with your medicine. What should I watch for while using this medicine? Tell your doctor if your symptoms do not get better or if they get worse. Visit your doctor or health care professional for regular checks on your progress. Because it may take several weeks to see the full effects of this medicine, it is important to continue your treatment as prescribed by your doctor. Patients and their families should watch out for new or worsening thoughts of suicide or depression. Also watch out for sudden changes in feelings such as feeling anxious, agitated, panicky, irritable, hostile, aggressive, impulsive, severely restless, overly excited and hyperactive, or not being able to sleep. If this happens, especially at the beginning of treatment or after a change in dose, call your health   care professional. You may get drowsy or dizzy. Do not drive, use machinery, or do anything that needs mental alertness until you know how this medicine  affects you. Do not stand or sit up quickly, especially if you are an older patient. This reduces the risk of dizzy or fainting spells. Alcohol may interfere with the effect of this medicine. Avoid alcoholic drinks. This medicine may cause dry eyes and blurred vision. If you wear contact lenses you may feel some discomfort. Lubricating drops may help. See your eye doctor if the problem does not go away or is severe. Your mouth may get dry. Chewing sugarless gum or sucking hard candy, and drinking plenty of water may help. Contact your doctor if the problem does not go away or is severe. What side effects may I notice from receiving this medicine? Side effects that you should report to your doctor or health care professional as soon as possible:  allergic reactions like skin rash, itching or hives, swelling of the face, lips, or tongue  anxious  changes in vision  chest pain  confusion  elevated mood, decreased need for sleep, racing thoughts, impulsive behavior  eye pain  fast, irregular heartbeat  feeling faint or lightheaded, falls  feeling agitated, angry, or irritable  fever or chills, sore throat  hallucination, loss of contact with reality  loss of balance or coordination  mouth sores  redness, blistering, peeling or loosening of the skin, including inside the mouth  restlessness, pacing, inability to keep still  seizures  stiff muscles  suicidal thoughts or other mood changes  trouble passing urine or change in the amount of urine  trouble sleeping  unusual bleeding or bruising  unusually weak or tired  vomiting Side effects that usually do not require medical attention (report to your doctor or health care professional if they continue or are bothersome):  change in appetite  constipation  dizziness  dry mouth  muscle aches or pains  nausea  tired  weight gain This list may not describe all possible side effects. Call your doctor for  medical advice about side effects. You may report side effects to FDA at 1-800-FDA-1088. Where should I keep my medicine? Keep out of the reach of children. Store at room temperature between 15 and 30 degrees C (59 and 86 degrees F) Protect from light and moisture. Throw away any unused medicine after the expiration date. NOTE: This sheet is a summary. It may not cover all possible information. If you have questions about this medicine, talk to your doctor, pharmacist, or health care provider.  2020 Elsevier/Gold Standard (2016-01-17 17:30:45)  

## 2020-04-09 ENCOUNTER — Ambulatory Visit: Payer: Medicare HMO

## 2020-04-09 ENCOUNTER — Other Ambulatory Visit: Payer: Self-pay

## 2020-04-09 NOTE — Patient Instructions (Signed)
Visit Information  Goals Addressed              This Visit's Progress   .  I am weighing now (pt-stated)        CARE PLAN ENTRY (see longtitudinal plan of care for additional care plan information)   Current Barriers:  [name] Knowledge deficit related to basic heart failure pathophysiology and self care management  Case Manager Clinical Goal(s):  [name] Over the next 90 days, patient will weigh self daily and record  Interventions:  . Provided verbal education on low sodium diet . Reviewed Heart Failure Action Plan in depth  . Advised patient to weigh each morning after emptying bladder . Discussed importance of daily weight and advised patient to weigh and record daily . 04/09/20 . Spoke with the patient and she states that she is doing better . Her weight today is 123 lbs.  She is eating more and taking her meds as prescribed. . She denies any chest pain shortness of breath or swelling she just states that she has some aching in her legs but feels that is just arthritis . She had not checked her BP yet today but states that it has been doing well.  Patient Self Care Activities:  . Takes Heart Failure Medications as prescribed . Adheres to low sodium diet  Please see past updates related to this goal by clicking on the "Past Updates" button in the selected goal         Ms. Briguglio was given information about Care Management services today including:  1. Care Management services include personalized support from designated clinical staff supervised by her physician, including individualized plan of care and coordination with other care providers 2. 24/7 contact phone numbers for assistance for urgent and routine care needs. 3. The patient may stop CCM services at any time (effective at the end of the month) by phone call to the office staff.  Patient agreed to services and verbal consent obtained.   The patient verbalized understanding of instructions provided today and declined a  print copy of patient instruction materials.   The care management team will reach out to the patient again over the next 21 days.   Lazaro Arms RN, BSN, [facility] Care Management Coordinator [place] Phone: [phone] Fax: [phone]

## 2020-04-09 NOTE — Chronic Care Management (AMB) (Signed)
  Care Management   Follow Up Note   04/09/2020 Name: Jennifer Foster MRN: [id number] DOB: 04-05-1946  Referred by: [name], MD Reason for referral : Appointment (CHF HTN)   Jennifer Foster is a 74 y.o. year old female who is a primary care patient of [name], MD. The care management team was consulted for assistance with care management and care coordination needs.    Review of patient status, including review of consultants reports, relevant laboratory and other test results, and collaboration with appropriate care team members and the patient's provider was performed as part of comprehensive patient evaluation and provision of chronic care management services.    SDOH (Social Determinants of Health) assessments performed: No See Care Plan activities for detailed interventions related to [facility])     Advanced Directives: See Care Plan and Vynca application for related entries.   Goals Addressed              This Visit's Progress   .  I am weighing now (pt-stated)        CARE PLAN ENTRY (see longtitudinal plan of care for additional care plan information)   Current Barriers:  [name] Knowledge deficit related to basic heart failure pathophysiology and self care management  Case Manager Clinical Goal(s):  [name] Over the next 90 days, patient will weigh self daily and record  Interventions:  . Provided verbal education on low sodium diet . Reviewed Heart Failure Action Plan in depth  . Advised patient to weigh each morning after emptying bladder . Discussed importance of daily weight and advised patient to weigh and record daily . 04/09/20 . Spoke with the patient and she states that she is doing better . Her weight today is 123 lbs.  She is eating more and taking her meds as prescribed. . She denies any chest pain shortness of breath or swelling she just states that she has some aching in her legs but feels that is just arthritis . She had not checked her BP yet today  but states that it has been doing well.  Patient Self Care Activities:  . Takes Heart Failure Medications as prescribed . Adheres to low sodium diet  Please see past updates related to this goal by clicking on the "Past Updates" button in the selected goal          The care management team will reach out to the patient again over the next 21 days.   Lazaro Arms RN, BSN, [facility] Care Management Coordinator [place] Phone: [phone] Fax: [phone]

## 2020-04-17 ENCOUNTER — Other Ambulatory Visit: Payer: Self-pay | Admitting: Family Medicine

## 2020-04-17 ENCOUNTER — Telehealth: Payer: Self-pay | Admitting: Family Medicine

## 2020-04-17 MED ORDER — DICLOFENAC SODIUM 1 % EX GEL: 4.0000 g | Freq: Four times a day (QID) | CUTANEOUS | 1 refills | Status: AC

## 2020-04-17 NOTE — Progress Notes (Deleted)
Cardiology Office Note:    Date:  04/17/2020   ID:  Jennifer Foster, DOB [birth year], MRN [id number]  PCP:  Jennifer Feil, MD  Cardiologist:  Jennifer Moores, MD   Electrophysiologist:  None   Referring MD: Jennifer Feil, MD   Chief Complaint: *** No chief complaint on file.    Patient Profile:    Jennifer Foster is a 74 y.o. female with:   Coronary artery disease   Myoview 3/19: ant ischemia   Cath 3/19: mRCA 60, dRCA 60-70, mild-mod dz of LAD, LCx - Med Rx  Chronic Diastolic CHF  Echocardiogram 2/19: EF 60-65, Gr 2 DD, LAE  Paroxysmal atrial fibrillation  CHA2DS2-VASc=5 (HTN, CAD, CHF, female, age x 1) >> Apixaban     Hypertension   Thoracic aortic aneurysm   CT 04/2017: 4.1 cm   MRA 05/2019: 4.0 cm   Anemia of chronic disease  Hx of Remote GI bleed  UGI bleed 09/2019  Chronic kidney disease stage 3  Hx of substance abuse (cocaine)  Prior CV studies: Echo 03/23/2020 EF 55-60, normal wall motion, normal RVSF, BAE, mild MR, aortic root 40 mm, RVSP 27.6  Echocardiogram 12/26/2019 EF 50, mod LVH, Lat HK, mild reduced RVSF, RVSP 63.7 (severely elevated), severe BAE, mild-mod MR, mild-mod TR, trivial AI  Chest MRA 05/20/2019 Asc thoracic aorta 4.0 cm  Cardiac catheterization 12/03/2017 LAD mild disease LCx proximal 35; OM230 RCA mid 60, distal 65 LVEDP 15-20  Nuclear stress test 11/12/2017 Medium size, moderate severity partially reversible anterior perfusion defect suggestive of ischemia. There is also a smaller, basal to mid inferior perfusion defect which is fixed, suggestive of artifact or scar. LVEF 50% with inferior hypokinesis. This is an intermediate risk study. Clinical correlation is recommended.  Echo 10/22/2017 Moderate LVH, EF 10-25, grade 2 diastolic dysfunction, trivial MR, moderate to severe LAE, PASP 30  Chest CT w/ contrast 04/03/17 IMPRESSION: 1. No suspicious pulmonary nodule or mass. 2. Atherosclerotic and mildly aneurysmal  ascending thoracic aorta with a maximal diameter of 4.1 cm.Recommend annual imaging followup by CTA or MRA. This recommendation follows 2010 ACCF/AHA/AATS/ACR/ASA/SCA/SCAI/SIR/STS/SVM Guidelines for the Diagnosis and Management of Patients with Thoracic Aortic Disease. Circulation. 2010; 121: E527-P824 3. Coronary artery calcifications 4. Mild centrilobular pulmonary emphysema. 5. Multinodular thyroid goiter. 6. Left adrenal thickening likely representing hyperplasia and 1.9 cm right adrenal adenoma. Aortic Atherosclerosis(ICD10-170.0); Aortic Atherosclerosis (ICD10-I70.0) and Emphysema (ICD10-J43.9).  Holter 7/18 NSR with PAC's Brief short SVT Overall, unremarkable  Nuclear stress test 11/23/2014 Normal stress nuclear study.overall low risk with no evidence of significant ischemia identified. Left ventricular hypertrophy suggested by increased wall thickness seen on perfusion images. LV Ejection Fraction: 56%.  Echo 03/16/2013 Severe LVH, EF 55-60, mild MR, mild to moderate LAE, PASP 33  History of Present Illness:    Jennifer Foster was last seen 01/10/20.  She had recently been in the hospital with CAP and an echocardiogram showed EF 50 with RVSP 63.7.  A follow-up echocardiogram demonstrated normal LV function and improved RVSP (27.6).  She returns for follow-up.***  Past Medical History:  Diagnosis Date  . Acute on chronic blood loss anemia 06/26/2015  . Acute respiratory failure with hypoxia ([place]) 02/21/2018  . Aortic atherosclerosis ([place]) 01/01/2018   CT 04/2017  . Asthma   . Atrial fibrillation ([place]) 10/13/2017   Newly diagnosed in Jan 2019 // Apixaban for anticoag // Apixaban held in 2019 for anemia but resumed; Hgb stable since restarting  . CAD (coronary artery disease) 11/27/2017  Nuc stress 3/19: anterior ischemia, ?inf scar, EF 50, intermediate risk // LHC 4/19: LAD mild dz, pLCx 35, OM2 30, mRCA 60, dRCA 65, LVEDP 15-20  . Chronic diastolic CHF (congestive heart  failure) ([place]) 03/23/2018   Echo 2/19: Moderate LVH, EF 73-53, grade 2 diastolic dysfunction, trivial MR, moderate to severe LAE, PASP 30//Echocardiogram 7/21: EF 55-60, severe LVH, severe LAE, mod RAE, mild MR, mild dilation of Asc aorta (40 mm), RVSP 27.6   . Chronic kidney disease (CKD), stage III (moderate) 06/27/2015  . DIVERTICULAR BLEEDING, HX OF 10/14/2007   Colonoscopy 2008 showed diverticulosis.   [name] GERD (gastroesophageal reflux disease)   . Gout   . Hyperlipemia   . Hypertension   . Proteinuria 01/11/2016  . Thoracic aortic aneurysm 04/03/2017   CT 8/18: ascending thoracic aorta 4.1 cm // unable to do CTA due to CKD // Chest MRA 05/2019: Ascending thoracic aorta 40 mm    Current Medications: No outpatient medications have been marked as taking for the 04/18/20 encounter (Appointment) with Richardson Dopp T, PA-C.     Allergies:   Ace inhibitors, Lisinopril, and Tramadol   Social History   Tobacco Use  . Smoking status: Former Smoker    Packs/day: 0.25    Types: Cigarettes  . Smokeless tobacco: Never Used  Vaping Use  . Vaping Use: Never used  Substance Use Topics  . Alcohol use: Yes    Alcohol/week: 0.0 standard drinks    Comment: occasionally  . Drug use: No     Family Hx: The patient's family history includes Diabetes type II in an other family member; Kidney failure in her son and another family member.  ROS ***  EKGs/Labs/Other Test Reviewed:    EKG:  EKG is *** ordered today.  The ekg ordered today demonstrates ***  Recent Labs: 12/26/2019: ALT 11 01/17/2020: BUN 21; Creatinine, Ser 2.54; Hemoglobin 9.6; Platelets 152; Potassium 3.5; Sodium 140   Recent Lipid Panel Lab Results  Component Value Date/Time   CHOL 155 01/12/2019 01:53 PM   TRIG 170 (H) 01/12/2019 01:53 PM   HDL 45 01/12/2019 01:53 PM   CHOLHDL 3.4 01/12/2019 01:53 PM   CHOLHDL 2.3 03/18/2016 11:20 AM   LDLCALC 76 01/12/2019 01:53 PM   LDLDIRECT 64 05/31/2019 09:57 AM   LDLDIRECT 88  07/16/2011 11:38 AM    Physical Exam:    VS:  There were no vitals taken for this visit.    Wt Readings from Last 3 Encounters:  04/06/20 123 lb 6.4 oz (56 kg)  01/24/20 128 lb 9.6 oz (58.3 kg)  01/17/20 132 lb 9.6 oz (60.1 kg)     Physical Exam ***  ASSESSMENT & PLAN:    ***  1. Chronic diastolic CHF (congestive heart failure) ([place]) Recent admission to the hospital with pneumonia.  Her echocardiogram demonstrated EF 50% with severe pulmonary hypertension.  This was felt to be related to her pneumonia.  Currently, her volume status appears stable.  She does have some ankle edema.  I have instructed her to take 40 mg of Lasix on days when her edema is worse.  2. Other secondary pulmonary hypertension ([place]) As noted, her RVSP was significantly elevated on echocardiogram during her admission for pneumonia.  This was likely related to her acute illness.  I will arrange a follow-up echocardiogram in 6-8 weeks.  If her RVSP remains significantly elevated or her ejection fraction is depressed, we may need to consider right and left heart catheterization.  3. Coronary artery  disease involving native coronary artery of native heart without angina pectoris Moderate nonobstructive disease by cardiac catheterization in April 2019.  She is not having anginal symptoms.  She is not on aspirin as she is on Apixaban.  Continue rosuvastatin.  4. Stage 3b chronic kidney disease Recent creatinine 2.28.  Obtain repeat BMET today.  5. Paroxysmal atrial fibrillation (HCC) She is currently in atrial fibrillation.  This has been persistent since January.  She is asymptomatic.  Rate is controlled.  She remains on anticoagulation with Apixaban.  I think it is reasonable to continue rate control strategy for now.  6. Essential hypertension Blood pressure somewhat elevated today.  She has not had her medications yet today.  We will try to provide her with a blood pressure cuff so that she can monitor her blood  pressure at home.  7. Thoracic aortic aneurysm without rupture ([place]) Her aneurysm by MRA in September 2020 measured 4 cm.  She will need a follow-up again in September 2021.  8. Iron deficiency anemia due to chronic blood loss She has not had follow-up with primary care since discharge.  She does not think that she has sucralfate at home.  Recent hemoglobins were stable.  Obtain a follow-up CBC today.  I will request follow-up with her PCP in the next week.   Dispo:  No follow-ups on file.   Medication Adjustments/Labs and Tests Ordered: Current medicines are reviewed at length with the patient today.  Concerns regarding medicines are outlined above.  Tests Ordered: No orders of the defined types were placed in this encounter.  Medication Changes: No orders of the defined types were placed in this encounter.   Signed, Richardson Dopp, PA-C  04/17/2020 5:33 PM    [facility] [place], [place], [place]  [address] Phone: [phone]; Fax: [phone]

## 2020-04-17 NOTE — Telephone Encounter (Signed)
Refill

## 2020-04-18 ENCOUNTER — Ambulatory Visit: Payer: Medicare HMO | Admitting: Physician Assistant

## 2020-04-18 DIAGNOSIS — I5032 Chronic diastolic (congestive) heart failure: Secondary | ICD-10-CM

## 2020-04-18 DIAGNOSIS — I2510 Atherosclerotic heart disease of native coronary artery without angina pectoris: Secondary | ICD-10-CM

## 2020-04-18 DIAGNOSIS — I712 Thoracic aortic aneurysm, without rupture: Secondary | ICD-10-CM

## 2020-04-18 DIAGNOSIS — I10 Essential (primary) hypertension: Secondary | ICD-10-CM

## 2020-04-18 DIAGNOSIS — I480 Paroxysmal atrial fibrillation: Secondary | ICD-10-CM

## 2020-04-24 ENCOUNTER — Telehealth: Payer: Self-pay

## 2020-04-24 NOTE — Chronic Care Management (AMB) (Signed)
Care Management   Note  04/24/2020 Name: [name] MRN: [id number] DOB: 1945-11-02  Jennifer Foster is a 74 y.o. year old female who is a primary care patient of Doreene Eland, MD and is actively engaged with the care management team. I reached out to Danise Mina by phone today to assist with scheduling an initial visit with the Licensed Clinical Social Worker  Follow up plan: Unsuccessful telephone outreach attempt made.  The care management team will reach out to the patient again over the next 7 days.  If patient returns call to provider office, please advise to call Embedded Care Management Care Guide Penne Lash  at [phone]  Penne Lash, RMA Care Guide, Embedded Care Coordination [facility]  [place], [place] [address] Direct Dial: [phone] [email or web address]

## 2020-04-27 ENCOUNTER — Ambulatory Visit: Payer: Self-pay | Admitting: Licensed Clinical Social Worker

## 2020-04-27 NOTE — Chronic Care Management (AMB) (Signed)
   Social Work  Care Management Collaboration 04/27/2020 Name: Jennifer Foster MRN: [id number] DOB: April 03, 1946 Jennifer Foster is a 74 y.o. year old female who sees [name], MD for primary care. LCSW was consulted to assistance patient with  Mental Health Counseling and resources.   CCM Care Guide reached out to patient to schedule appointment however unable to reach patient.   Recommendation: After collaboration with CCM RN care manager it is determined she has a phone appointment with patient.  If she is able to reach patient CCM RN will schedule phone appointment with LCSW.   Intervention: Patient was not interviewed or contacted during this encounter.  LCSW collaborated with CCM RN for ongoing patient's needs. Plan:  1. RN care manager will follow up with the patient for ongoing needs.    2. If further intervention or needs are Identified  RN care manager will contact LCSW to provide interventions.    Review of patient status, including review of consultants reports, relevant laboratory and other test results, and collaboration with appropriate care team members and the patient's provider was performed as part of comprehensive patient evaluation and provision of chronic care management services.    Casimer Lanius, [place] / [place]   [phone] 3:59 PM

## 2020-04-30 ENCOUNTER — Ambulatory Visit: Payer: Medicare HMO

## 2020-04-30 NOTE — Chronic Care Management (AMB) (Signed)
  Care Management   Follow Up Note   04/30/2020 Name: Jennifer Foster MRN: [id number] DOB: May 25, 1946  Referred by: [name], MD Reason for referral : Chronic Care Management (HTN CHF)   Jennifer Foster is a 74 y.o. year old female who is a primary care patient of [name], MD. The care management team was consulted for assistance with care management and care coordination needs.    Review of patient status, including review of consultants reports, relevant laboratory and other test results, and collaboration with appropriate care team members and the patient's provider was performed as part of comprehensive patient evaluation and provision of chronic care management services.    SDOH (Social Determinants of Health) assessments performed: No See Care Plan activities for detailed interventions related to [facility])     Advanced Directives: See Care Plan and Vynca application for related entries.   Goals Addressed              This Visit's Progress   .  I am weighing now (pt-stated)        CARE PLAN ENTRY (see longtitudinal plan of care for additional care plan information)   Current Barriers:  [name] Knowledge deficit related to basic heart failure pathophysiology and self care management  Case Manager Clinical Goal(s):  [name] Over the next 90 days, patient will weigh self daily and record  Interventions:  . Provided verbal education on low sodium diet . Reviewed Heart Failure Action Plan in depth  . Advised patient to weigh each morning after emptying bladder . Discussed importance of daily weight and advised patient to weigh and record daily . 04/30/20 . Spoke with the patient and she states that she is not doing well.  She states that her neighbor died that she helped take care of and it has hit her pretty hard.  RNCM has scheduled her an appointment with LCSW Moore at the patients convince to discuss counseling. . She states that her weight is 114 lbs.  She is still  weighing on her sisters scales because she can't get the scales she has to work.  I advised the patient the next time she comes in the office to bring them with her to let us help her with them. She said that she would. . The patient denies any chest pain shortness of breath but has a little swelling in her ankles.  Advised her to elevate her legs, monitor the salt . She states that she continues to take the medication as prescribed.  She states that her goal is to be hear as long a possible.  Patient Self Care Activities:  . Takes Heart Failure Medications as prescribed . Adheres to low sodium diet  Please see past updates related to this goal by clicking on the "Past Updates" button in the selected goal          The care management team will reach out to the patient again over the next 14 days.   Lazaro Arms RN, BSN, [facility] Care Management Coordinator [place] Phone: [phone] Fax: [phone]

## 2020-04-30 NOTE — Patient Instructions (Signed)
Visit Information  Goals Addressed              This Visit's Progress   .  I am weighing now (pt-stated)        CARE PLAN ENTRY (see longtitudinal plan of care for additional care plan information)   Current Barriers:  [name] Knowledge deficit related to basic heart failure pathophysiology and self care management  Case Manager Clinical Goal(s):  [name] Over the next 90 days, patient will weigh self daily and record  Interventions:  . Provided verbal education on low sodium diet . Reviewed Heart Failure Action Plan in depth  . Advised patient to weigh each morning after emptying bladder . Discussed importance of daily weight and advised patient to weigh and record daily . 04/30/20 . Spoke with the patient and she states that she is not doing well.  She states that her neighbor died that she helped take care of and it has hit her pretty hard.  RNCM has scheduled her an appointment with LCSW Moore at the patients convince to discuss counseling. . She states that her weight is 114 lbs.  She is still weighing on her sisters scales because she can't get the scales she has to work.  I advised the patient the next time she comes in the office to bring them with her to let us help her with them. She said that she would. . The patient denies any chest pain shortness of breath but has a little swelling in her ankles.  Advised her to elevate her legs, monitor the salt . She states that she continues to take the medication as prescribed.  She states that her goal is to be hear as long a possible.  Patient Self Care Activities:  . Takes Heart Failure Medications as prescribed . Adheres to low sodium diet  Please see past updates related to this goal by clicking on the "Past Updates" button in the selected goal         Jennifer Foster was given information about Care Management services today including:  1. Care Management services include personalized support from designated clinical staff supervised by  her physician, including individualized plan of care and coordination with other care providers 2. 24/7 contact phone numbers for assistance for urgent and routine care needs. 3. The patient may stop CCM services at any time (effective at the end of the month) by phone call to the office staff.  Patient agreed to services and verbal consent obtained.   The patient verbalized understanding of instructions provided today and declined a print copy of patient instruction materials.   The care management team will reach out to the patient again over the next 14 days.   Jennifer Arms RN, BSN, [facility] Care Management Coordinator [place] Phone: [phone] Fax: [phone]

## 2020-05-01 ENCOUNTER — Telehealth: Payer: Medicare HMO

## 2020-05-02 ENCOUNTER — Telehealth: Payer: Self-pay | Admitting: Family Medicine

## 2020-05-02 DIAGNOSIS — I712 Thoracic aortic aneurysm, without rupture, unspecified: Secondary | ICD-10-CM

## 2020-05-02 NOTE — Assessment & Plan Note (Signed)
I called to remind her that her AAA is due for follow-up now. MRA chest ordered.  The nurse will call to schedule an appointment.  She agreed with the plan.

## 2020-05-02 NOTE — Telephone Encounter (Signed)
I called to remind her that her AAA is due for follow-up now. MRA chest ordered.  The nurse will call to schedule an appointment.  She agreed with the plan.

## 2020-05-03 NOTE — Progress Notes (Signed)
----   Message ----- From: Maurine Cane, LCSW Sent: 04/27/2020   3:59 PM EDT To: Michaelle Birks, RMA Subject: RE: Urgent referral                            Mairlyn Tegtmeyer , [place] will schedule patient with me during her phone appointment Monday 04/30/20.    If Traci doe not reach patient I will call her next week.   Thanks [employer]

## 2020-05-04 NOTE — Telephone Encounter (Signed)
Spoke with Jennifer Foster with Radiology Scheduling. Made appt for MRI at [facility] Sep 20th at 10:30am. Will call pt to inform of date. Salvatore Marvel, CMA

## 2020-05-04 NOTE — Telephone Encounter (Signed)
Spoke with pt informed her of MRI appt date. Mon. Sep 20th at 10:30 at [facility]. Pt understood

## 2020-05-08 ENCOUNTER — Ambulatory Visit: Payer: Medicare HMO | Admitting: Licensed Clinical Social Worker

## 2020-05-08 DIAGNOSIS — F419 Anxiety disorder, unspecified: Secondary | ICD-10-CM

## 2020-05-08 NOTE — Chronic Care Management (AMB) (Signed)
    Clinical Social Work  Care Management Outreach   05/08/2020 Name: KHIA DIETERICH MRN: [id number] DOB: Jul 11, 1946  THOMAS RHUDE is a 74 y.o. year old female who is a primary care patient of [name], MD .  The Care Management team was consulted for assistance with Mental Health Counseling and Resources.  LCSW reached out to [name] today by phone to introduce self, assess needs and offer Care Management services and interventions.   Intervention: Patient agreed that the time was still good for the appointment however during the assessment patient asked LCSW to call back in about an hour.  Called patient at 12:10 during time she requested.  She is unable to complete call.  Asked LCSW to call her back at 11:00 on 05/10/20. Plan: phone appointment re-scheduled 05/10/2020.  Review of patient status, including review of consultants reports, relevant laboratory and other test results, and collaboration with appropriate care team members and the patient's provider was performed as part of comprehensive patient evaluation and provision of care management services.    Casimer Lanius, [place] / [place]   [phone] 12:20 PM

## 2020-05-09 DIAGNOSIS — D631 Anemia in chronic kidney disease: Secondary | ICD-10-CM | POA: Diagnosis not present

## 2020-05-09 DIAGNOSIS — N184 Chronic kidney disease, stage 4 (severe): Secondary | ICD-10-CM | POA: Diagnosis not present

## 2020-05-09 DIAGNOSIS — N189 Chronic kidney disease, unspecified: Secondary | ICD-10-CM | POA: Diagnosis not present

## 2020-05-09 DIAGNOSIS — I129 Hypertensive chronic kidney disease with stage 1 through stage 4 chronic kidney disease, or unspecified chronic kidney disease: Secondary | ICD-10-CM | POA: Diagnosis not present

## 2020-05-09 DIAGNOSIS — N2581 Secondary hyperparathyroidism of renal origin: Secondary | ICD-10-CM | POA: Diagnosis not present

## 2020-05-10 ENCOUNTER — Ambulatory Visit: Payer: Medicare HMO | Admitting: Licensed Clinical Social Worker

## 2020-05-10 DIAGNOSIS — Z7189 Other specified counseling: Secondary | ICD-10-CM

## 2020-05-10 DIAGNOSIS — F419 Anxiety disorder, unspecified: Secondary | ICD-10-CM

## 2020-05-10 NOTE — Progress Notes (Signed)
Virtual Visit via Telephone Note   This visit type was conducted due to national recommendations for restrictions regarding the COVID-19 Pandemic (e.g. social distancing) in an effort to limit this patient's exposure and mitigate transmission in our community.  Due to her co-morbid illnesses, this patient is at least at moderate risk for complications without adequate follow up.  This format is felt to be most appropriate for this patient at this time.  The patient did not have access to video technology/had technical difficulties with video requiring transitioning to audio format only (telephone).  All issues noted in this document were discussed and addressed.  No physical exam could be performed with this format.  Please refer to the patient's chart for her  consent to telehealth for [facility].    Date:  05/11/2020   ID:  Jennifer Foster, DOB Nov 10, 1945, MRN [id number] The patient was identified using 2 identifiers.  Patient Location: Home Provider Location: Office/Clinic  PCP:  [name], MD  Cardiologist:  Mertie Moores, MD   Electrophysiologist:  None   Evaluation Performed:  Follow-Up Visit  Chief Complaint:  Follow-up (CAD, CHF, A. fib)    Patient Profile:  Coronary artery disease   Myoview 3/19: ant ischemia   Cath 3/19: mRCA 60, dRCA 60-70, mild-mod dz of LAD, LCx - Med Rx  Chronic Diastolic CHF  Echocardiogram 2/19: EF 60-65, Gr 2 DD, LAE  Paroxysmal atrial fibrillation  CHA2DS2-VASc=5 (HTN, CAD, CHF, female, age x 1) >> Apixaban     Hypertension   Thoracic aortic aneurysm   CT 04/2017: 4.1 cm   MRA 05/2019: 4.0 cm   Anemia of chronic disease  Hx of Remote GI bleed  UGI bleed 09/2019  Chronic kidney disease stage 3  Hx of substance abuse (cocaine)  Prior CV studies: Echo 03/23/2020 EF 55-60, normal wall motion, normal RVSF, BAE, mild MR, aortic root 40 mm, RVSP 27.6  Echocardiogram 12/26/2019 EF 50, mod LVH, Lat HK, mild reduced RVSF,  RVSP 63.7 (severely elevated), severe BAE, mild-mod MR, mild-mod TR, trivial AI  Chest MRA 05/20/2019 Asc thoracic aorta 4.0 cm  Cardiac catheterization 12/03/2017 LAD mild disease LCx proximal 35; OM230 RCA mid 60, distal 65 LVEDP 15-20  Nuclear stress test 11/12/2017 Medium size, moderate severity partially reversible anterior perfusion defect suggestive of ischemia. There is also a smaller, basal to mid inferior perfusion defect which is fixed, suggestive of artifact or scar. LVEF 50% with inferior hypokinesis. This is an intermediate risk study. Clinical correlation is recommended.  Echo 10/22/2017 Moderate LVH, EF 97-67, grade 2 diastolic dysfunction, trivial MR, moderate to severe LAE, PASP 30  Chest CT w/ contrast 04/03/17 IMPRESSION: 1. No suspicious pulmonary nodule or mass. 2. Atherosclerotic and mildly aneurysmal ascending thoracic aorta with a maximal diameter of 4.1 cm.Recommend annual imaging followup by CTA or MRA. This recommendation follows 2010 ACCF/AHA/AATS/ACR/ASA/SCA/SCAI/SIR/STS/SVM Guidelines for the Diagnosis and Management of Patients with Thoracic Aortic Disease. Circulation. 2010; 121: H419-F790 3. Coronary artery calcifications 4. Mild centrilobular pulmonary emphysema. 5. Multinodular thyroid goiter. 6. Left adrenal thickening likely representing hyperplasia and 1.9 cm right adrenal adenoma. Aortic Atherosclerosis(ICD10-170.0); Aortic Atherosclerosis (ICD10-I70.0) and Emphysema (ICD10-J43.9).  Holter 7/18 NSR with PAC's Brief short SVT Overall, unremarkable  Nuclear stress test 11/23/2014 Normal stress nuclear study.overall low risk with no evidence of significant ischemia identified. Left ventricular hypertrophy suggested by increased wall thickness seen on perfusion images. LV Ejection Fraction: 56%.  Echo 03/16/2013 Severe LVH, EF 55-60, mild MR, mild to moderate LAE,  PASP 33  History of Present Illness:    Ms. Jennifer Foster was last seen  01/10/20.  She had recently been in the hospital with CAP and an echocardiogram showed EF 50 with RVSP 63.7.  A follow-up echocardiogram demonstrated normal LV function and improved RVSP (27.6).  Today, she notes she is doing well.  She has not had any of her blood pressure medications yet today.  She has not had chest discomfort.  She gets short of breath with more extreme activities.  She has not had orthopnea, lower extremity swelling or syncope.  Past Medical History:  Diagnosis Date  . Acute on chronic blood loss anemia 06/26/2015  . Acute respiratory failure with hypoxia ([place]) 02/21/2018  . Aortic atherosclerosis ([place]) 01/01/2018   CT 04/2017  . Asthma   . Atrial fibrillation ([place]) 10/13/2017   Newly diagnosed in Jan 2019 // Apixaban for anticoag // Apixaban held in 2019 for anemia but resumed; Hgb stable since restarting  . CAD (coronary artery disease) 11/27/2017   Nuc stress 3/19: anterior ischemia, ?inf scar, EF 50, intermediate risk // LHC 4/19: LAD mild dz, pLCx 35, OM2 30, mRCA 60, dRCA 65, LVEDP 15-20  . Chronic diastolic CHF (congestive heart failure) ([place]) 03/23/2018   Echo 2/19: Moderate LVH, EF 30-16, grade 2 diastolic dysfunction, trivial MR, moderate to severe LAE, PASP 30//Echocardiogram 7/21: EF 55-60, severe LVH, severe LAE, mod RAE, mild MR, mild dilation of Asc aorta (40 mm), RVSP 27.6   . Chronic kidney disease (CKD), stage III (moderate) 06/27/2015  . DIVERTICULAR BLEEDING, HX OF 10/14/2007   Colonoscopy 2008 showed diverticulosis.   [name] GERD (gastroesophageal reflux disease)   . Gout   . Hyperlipemia   . Hypertension   . Proteinuria 01/11/2016  . Thoracic aortic aneurysm 04/03/2017   CT 8/18: ascending thoracic aorta 4.1 cm // unable to do CTA due to CKD // Chest MRA 05/2019: Ascending thoracic aorta 40 mm   Past Surgical History:  Procedure Laterality Date  . ABDOMINAL HYSTERECTOMY  1981   partial, per pt history  . BIOPSY  09/05/2019   Procedure: BIOPSY;  Surgeon:  Ronnette Juniper, MD;  Location: [facility];  Service: Gastroenterology;;  . COLONOSCOPY N/A 12/08/2013   Procedure: COLONOSCOPY;  Surgeon: [name], MD;  Location: [place];  Service: Endoscopy;  Laterality: N/A;  . COLONOSCOPY N/A 06/28/2015   Procedure: COLONOSCOPY;  Surgeon: Clarene Essex, MD;  [facility];  Service: Endoscopy;  Laterality: N/A;  . ESOPHAGOGASTRODUODENOSCOPY N/A 12/08/2013   Procedure: ESOPHAGOGASTRODUODENOSCOPY (EGD);  Surgeon: [name], MD;  Location: [facility] END[facility]COPY;  Service: Endoscopy;  Laterality: N/A;  . ESOPHAGOGASTRODUODENOSCOPY (EGD) WITH PROPOFOL N/A 09/05/2019   Procedure: ESOPHAGOGASTRODUODENOSCOPY (EGD) WITH PROPOFOL;  Surgeon: Ronnette Juniper, MD;  Location: [facility] END[facility];  Service: Gastroenterology;  Laterality: N/A;  . GIVENS CAPSULE STUDY N/A 12/08/2013   Procedure: GIVENS CAPSULE STUDY;  Surgeon: [name], MD;  Location: [place];  Service: Endoscopy;  Laterality: N/A;  . LEFT HEART CATH AND CORONARY ANGIOGRAPHY N/A 12/03/2017   Procedure: LEFT HEART CATH AND CORONARY ANGIOGRAPHY;  Surgeon: Nelva Bush, MD;  Location: [place] CV [facility];  Service: Cardiovascular;  Laterality: N/A;  . LIPOMA EXCISION  01/28/11   neck     Current Meds  Medication Sig  . albuterol (VENTOLIN HFA) 108 (90 Base) MCG/ACT inhaler Inhale 2 puffs into the lungs every 6 (six) hours as needed for wheezing or shortness of breath.  [name] amLODipine (NORVASC) 10 MG tablet TAKE 1  TABLET(10 MG) BY MOUTH DAILY  . apixaban (ELIQUIS) 2.5 MG TABS tablet Take 1 tablet (2.5 mg total) by mouth 2 (two) times daily.  . Baclofen 5 MG TABS Take 1 tablet by mouth every 12 (twelve) hours as needed.  . calcitRIOL (ROCALTROL) 0.25 MCG capsule Take 0.25 mcg by mouth daily.   . diclofenac Sodium (VOLTAREN) 1 % GEL Apply 4 g topically 4 (four) times daily. Apply to hip joint  . escitalopram (LEXAPRO) 10 MG tablet TAKE 1 TABLET(10 MG) BY MOUTH DAILY  . famotidine (PEPCID) 20 MG tablet Take  1 tablet (20 mg total) by mouth daily.  . furosemide (LASIX) 20 MG tablet Take 1 tablet (20 mg total) by mouth daily.  . isosorbide mononitrate (IMDUR) 30 MG 24 hr tablet TAKE 1 TABLET(30 MG) BY MOUTH DAILY  . mirtazapine (REMERON) 15 MG tablet Take 1 tablet (15 mg total) by mouth at bedtime.  . nitroGLYCERIN (NITROSTAT) 0.4 MG SL tablet Place 1 tablet (0.4 mg total) under the tongue every 5 (five) minutes as needed for chest pain. If do not resolve after 1 tablet, go to ED  . potassium chloride SA (KLOR-CON) 20 MEQ tablet TAKE HALF TABLET BY MOUTH EVERY DAY  . rosuvastatin (CRESTOR) 20 MG tablet Take 1 tablet (20 mg total) by mouth at bedtime.     Allergies:   Ace inhibitors, Lisinopril, and Tramadol   Social History   Tobacco Use  . Smoking status: Former Smoker    Packs/day: 0.25    Types: Cigarettes  . Smokeless tobacco: Never Used  Vaping Use  . Vaping Use: Never used  Substance Use Topics  . Alcohol use: Yes    Alcohol/week: 0.0 standard drinks    Comment: occasionally  . Drug use: No     Family Hx: The patient's family history includes Diabetes type II in an other family member; Kidney failure in her son and another family member.  ROS:   Please see the history of present illness.    She has not had melena, hematochezia, hemoptysis. All other systems reviewed and are negative.   Labs/Other Tests and Data Reviewed:    EKG:  No ECG reviewed.  Recent Labs: 12/26/2019: ALT 11 01/17/2020: BUN 21; Creatinine, Ser 2.54; Hemoglobin 9.6; Platelets 152; Potassium 3.5; Sodium 140   Recent Lipid Panel Lab Results  Component Value Date/Time   CHOL 155 01/12/2019 01:53 PM   TRIG 170 (H) 01/12/2019 01:53 PM   HDL 45 01/12/2019 01:53 PM   CHOLHDL 3.4 01/12/2019 01:53 PM   CHOLHDL 2.3 03/18/2016 11:20 AM   LDLCALC 76 01/12/2019 01:53 PM   LDLDIRECT 64 05/31/2019 09:57 AM   LDLDIRECT 88 07/16/2011 11:38 AM    Wt Readings from Last 3 Encounters:  05/11/20 126 lb (57.2 kg)   04/06/20 123 lb 6.4 oz (56 kg)  01/24/20 128 lb 9.6 oz (58.3 kg)     Objective:    Vital Signs:  BP (!) 174/87   Pulse 67   Ht 5\' 4"  (1.626 m)   Wt 126 lb (57.2 kg)   BMI 21.63 kg/m    VITAL SIGNS:  reviewed GEN:  no acute distress RESPIRATORY:  No labored breathing  ASSESSMENT & PLAN:    1. Chronic diastolic CHF (congestive heart failure) (HCC) Overall, volume status seems to be stable.  Continue current dose of furosemide.  2. Other secondary pulmonary hypertension ([place]) This was related to her pneumonia.  Her RVSP returned to normal on follow-up echocardiogram in July  2021.  3. Coronary artery disease involving native coronary artery of native heart without angina pectoris Moderate nonobstructive disease by cardiac catheterization March 2019.  She is not having anginal symptoms.  She is not on aspirin as she is on Apixaban.  New rosuvastatin.  4. Stage 3b chronic kidney disease Most recent creatinine stable.  She is followed by Dr. Posey Pronto with nephrology.  5. Paroxysmal atrial fibrillation (HCC) Her heart rate seems to be well controlled.  When last seen, her ECG demonstrated a heart rate in the 50s.  Therefore, I am not certain she would tolerate resumption of her beta-blocker therapy.  She is on Apixaban 2.5 mg twice daily given her weight of 57 kg and creatinine >1.5.  Continue current medications.  6. Essential hypertension Blood pressure is above target.  I reviewed her most recent blood pressure readings.  I think she would benefit from resuming hydralazine 25 mg 3 times a day.  Continue current dose of isosorbide and amlodipine.  -Hydralazine 25 mg 3 times a day  -Monitor blood pressure over 2 weeks  7. Thoracic aortic aneurysm without rupture ([place]) 4 cm by MRA in September 2020.  She is due for follow-up.  It appears that primary care has already ordered this and arranged it.     Time:   Today, I have spent 11 minutes with the patient with telehealth  technology discussing the above problems.     Medication Adjustments/Labs and Tests Ordered: Current medicines are reviewed at length with the patient today.  Concerns regarding medicines are outlined above.   Tests Ordered: No orders of the defined types were placed in this encounter.   Medication Changes: Meds ordered this encounter  Medications  . hydrALAZINE (APRESOLINE) 25 MG tablet    Sig: Take 1 tablet (25 mg total) by mouth 3 (three) times daily.    Dispense:  270 tablet    Refill:  3    Follow Up:  In Person in 6 month(s)  Signed, Richardson Dopp, PA-C  05/11/2020 9:41 AM    [place]

## 2020-05-10 NOTE — Chronic Care Management (AMB) (Signed)
Care Management   Clinical Social Work initial Note  05/10/2020 Name: Jennifer Foster MRN: [id number] DOB: 1946/03/28 Jennifer Foster is a 74 y.o. year old female who sees [name], MD for primary care. The Care Management team was consulted by CCM RN to assist the patient with Mental Health Counseling and Resources.[name]  LCSW reached out to [name] today by phone to introduce self, assess needs and barriers to care.   Assessment: Patient is engaged in conversation.  Experiencing  stress, feeling overwhelmed and difficulty managing life stressor.       Recommendation: Patient may benefit from, and is in agreement to start ongoing counseling to assist with managing her symptoms. See care plan below.  Plan: LCSW will F/U with patient in 2 weeks.  Patient has outreach from [employer] in 1 week.  Review of patient status, including review of consultants reports, relevant laboratory and other test results, and collaboration with appropriate care team members and the patient's provider was performed as part of comprehensive patient evaluation and provision of chronic care management services.    Advance Directive Status: N See Care Plan for related entries.    SDOH (Social Determinants of Health) assessments performed: Yes:  SDOH Interventions     Most Recent Value  SDOH Interventions  SDOH Interventions for the Following Domains Depression  Depression Interventions/Treatment  Counseling  [referral for ongoing counseling]      ; Goals Addressed            This Visit's Progress   . Advance Directive       CARE PLAN ENTRY (see longitudinal plan of care for additional care plan information)  Current Barriers:  . Patient does not have an Forensic scientist . Patient acknowledges deficits, education and support in order to complete this document Clinical Social Work Goal(s): Over the next 30 to 60 days,  . the patient will review and complete Advance Directive packet, have notarized and  provide a copy to provider office . review mailed EMMI education on Advance Directive as evidenced by patient self report of review Interventions provided by LCSW: . Assessed understanding of Advance Directives . A voluntary discussion about advanced care planning including importance of advanced directives, healthcare proxy and living will was discussed with the patient.  . Mailed the patient EMMI educational information on Advance Directives as well as an Forensic scientist packet Patient Self Care Activities:  . Is able to complete documentation independently . Able to identify [place] / [place] . patient will review information mailed by LCSW Initial goal documentation    . Connect with counseling       CARE PLAN ENTRY (see longitudinal plan of care for additional care plan information)  Current Barriers:  . Patient with Anxiety and Depression acknowledges deficits with connecting to mental health provider for ongoing counseling.  . Patient needs Support, Education, and Care Coordination in order to meet unmet mental health needs  Clinical Social Work Goal(s):  [name] Over the next 30 days, patient will connect for ongoing counseling.  Interventions:  . Assessed patient's  previous treatment, needs and barriers to care . Patient interviewed and appropriate assessments performed:  . Collaborated with CCM RN regarding patient needs . Discussed several options for long term counseling based on need and insurance. Assisted patient with narrowing the options down to [facility]  ) . Reviewed mental health medications with patient prescribed by PCP and discussed compliance; patient taking  medication daily reports no missed doses 10mg  Lezapro and 15mg  Remeon) . [place] with patient to scheduled appointment  Oct 6th at 10:00 Patient Self Care Activities & Deficits:  . Patient is unable to independently navigate community resource options without care  coordination support . Patient is able to implement clinical interventions discussed today  . Patient will appointment at the [facility] . Patient is motivated for treatment Initial goal documentation       Outpatient Encounter Medications as of 05/10/2020  Medication Sig Note  . acetaminophen (TYLENOL) 500 MG tablet Take 500 mg by mouth every 6 (six) hours as needed.   [name] albuterol (VENTOLIN HFA) 108 (90 Base) MCG/ACT inhaler Inhale 2 puffs into the lungs every 6 (six) hours as needed for wheezing or shortness of breath. (Patient not taking: Reported on 01/17/2020)   . amLODipine (NORVASC) 10 MG tablet TAKE 1 TABLET(10 MG) BY MOUTH DAILY   . apixaban (ELIQUIS) 2.5 MG TABS tablet Take 1 tablet (2.5 mg total) by mouth 2 (two) times daily.   . Baclofen 5 MG TABS Take 1 tablet by mouth every 12 (twelve) hours as needed. (Patient not taking: Reported on 01/17/2020)   . calcitRIOL (ROCALTROL) 0.25 MCG capsule Take 0.25 mcg by mouth daily.    [name] Cod Liver Oil 1000 MG CAPS Take 1,000 mg by mouth daily.  (Patient not taking: Reported on 04/06/2020)   . diclofenac Sodium (VOLTAREN) 1 % GEL Apply 4 g topically 4 (four) times daily. Apply to hip joint   . escitalopram (LEXAPRO) 10 MG tablet TAKE 1 TABLET(10 MG) BY MOUTH DAILY   . famotidine (PEPCID) 20 MG tablet Take 1 tablet (20 mg total) by mouth daily.   . furosemide (LASIX) 20 MG tablet Take 1 tablet (20 mg total) by mouth daily.   [name] guaiFENesin (MUCINEX) 600 MG 12 hr tablet Take 1 tablet (600 mg total) by mouth 2 (two) times daily. (Patient not taking: Reported on 01/17/2020)   . ipratropium-albuterol (DUONEB) 0.5-2.5 (3) MG/3ML SOLN Take 3 mLs by nebulization every 4 (four) hours as needed. (Patient not taking: Reported on 01/17/2020)   . isosorbide mononitrate (IMDUR) 30 MG 24 hr tablet TAKE 1 TABLET(30 MG) BY MOUTH DAILY   . mirtazapine (REMERON) 15 MG tablet Take 1 tablet (15 mg total) by mouth at bedtime.   . Multiple Vitamin (MULTIVITAMIN WITH  MINERALS) TABS tablet Take 1 tablet by mouth daily. (Patient not taking: Reported on 04/06/2020)   . nitroGLYCERIN (NITROSTAT) 0.4 MG SL tablet Place 1 tablet (0.4 mg total) under the tongue every 5 (five) minutes as needed for chest pain. If do not resolve after 1 tablet, go to ED (Patient not taking: Reported on 01/17/2020)   . potassium chloride SA (KLOR-CON) 20 MEQ tablet TAKE HALF TABLET BY MOUTH EVERY DAY 04/06/2020: 20 my daily  . rosuvastatin (CRESTOR) 20 MG tablet Take 1 tablet (20 mg total) by mouth at bedtime.    No facility-administered encounter medications on file as of 05/10/2020.      Casimer Lanius, [place] / [place]   [phone] 3:36 PM

## 2020-05-11 ENCOUNTER — Encounter: Payer: Self-pay | Admitting: Physician Assistant

## 2020-05-11 ENCOUNTER — Telehealth: Payer: Self-pay | Admitting: *Deleted

## 2020-05-11 ENCOUNTER — Telehealth (INDEPENDENT_AMBULATORY_CARE_PROVIDER_SITE_OTHER): Payer: Medicare HMO | Admitting: Physician Assistant

## 2020-05-11 ENCOUNTER — Other Ambulatory Visit: Payer: Self-pay

## 2020-05-11 VITALS — BP 174/87 | HR 67 | Ht 64.0 in | Wt 126.0 lb

## 2020-05-11 DIAGNOSIS — N1832 Chronic kidney disease, stage 3b: Secondary | ICD-10-CM | POA: Diagnosis not present

## 2020-05-11 DIAGNOSIS — I2729 Other secondary pulmonary hypertension: Secondary | ICD-10-CM | POA: Diagnosis not present

## 2020-05-11 DIAGNOSIS — I712 Thoracic aortic aneurysm, without rupture, unspecified: Secondary | ICD-10-CM

## 2020-05-11 DIAGNOSIS — I10 Essential (primary) hypertension: Secondary | ICD-10-CM | POA: Diagnosis not present

## 2020-05-11 DIAGNOSIS — I2510 Atherosclerotic heart disease of native coronary artery without angina pectoris: Secondary | ICD-10-CM

## 2020-05-11 DIAGNOSIS — I480 Paroxysmal atrial fibrillation: Secondary | ICD-10-CM | POA: Diagnosis not present

## 2020-05-11 DIAGNOSIS — I5032 Chronic diastolic (congestive) heart failure: Secondary | ICD-10-CM | POA: Diagnosis not present

## 2020-05-11 MED ORDER — HYDRALAZINE HCL 25 MG PO TABS: 25.0000 mg | ORAL_TABLET | Freq: Three times a day (TID) | ORAL | 3 refills | Status: DC

## 2020-05-11 NOTE — Patient Instructions (Signed)
Medication Instructions:  Your physician has recommended you make the following change in your medication:   1) Start Hydralazine 25 mg, 1 tablet by mouth three times a day  *If you need a refill on your cardiac medications before your next appointment, please call your pharmacy*  Lab Work: None ordered today  Testing/Procedures: None ordered today  Follow-Up: On 11/09/2020 at 9:20AM with Mertie Moores, MD  Other Instructions Check your blood pressure several times a week for 2 weeks and call with readings.

## 2020-05-11 NOTE — Telephone Encounter (Deleted)
   NOTICE: DOTPHRASE NO LONGER IN USE.    If you were using this dotphrase to document consent, please use the updated dotphrase .[email or web address]. Follow instructions on screen. (Anything in red/blue will automatically disappear when you sign that encounter. Read the text in blue to the patient. Click in the yellow sentence, hit F2, and select Yes/No to document consent given. You can then sign the encounter and you are done.)  For appointment scheduling going forward, all Video Visits should be scheduled as a MyChart Video Visit appointment type (regardless of whether patient has MyChart or not). All Telephone Visits should be scheduled as a Banker Visit appointment type.  Under Appt notes, include the visit type - preferred contact # day of visit - reason for visit. (I.E. VIDEO - [phone] - discuss blood pressure).

## 2020-05-11 NOTE — Telephone Encounter (Signed)
  Patient Consent for Virtual Visit         Jennifer Foster has provided verbal consent on 05/11/2020 for a virtual visit (video or telephone).   CONSENT FOR VIRTUAL VISIT FOR:  Jennifer Foster  By participating in this virtual visit I agree to the following:  I hereby voluntarily request, consent and authorize [facility] and its employed or contracted physicians, physician assistants, nurse practitioners or other licensed health care professionals (the Practitioner), to provide me with telemedicine health care services (the "Services") as deemed necessary by the treating Practitioner. I acknowledge and consent to receive the Services by the Practitioner via telemedicine. I understand that the telemedicine visit will involve communicating with the Practitioner through live audiovisual communication technology and the disclosure of certain medical information by electronic transmission. I acknowledge that I have been given the opportunity to request an in-person assessment or other available alternative prior to the telemedicine visit and am voluntarily participating in the telemedicine visit.  I understand that I have the right to withhold or withdraw my consent to the use of telemedicine in the course of my care at any time, without affecting my right to future care or treatment, and that the Practitioner or I may terminate the telemedicine visit at any time. I understand that I have the right to inspect all information obtained and/or recorded in the course of the telemedicine visit and may receive copies of available information for a reasonable fee.  I understand that some of the potential risks of receiving the Services via telemedicine include:  [name] Delay or interruption in medical evaluation due to technological equipment failure or disruption; . Information transmitted may not be sufficient (e.g. poor resolution of images) to allow for appropriate medical decision making by the Practitioner;  and/or  . In rare instances, security protocols could fail, causing a breach of personal health information.  Furthermore, I acknowledge that it is my responsibility to provide information about my medical history, conditions and care that is complete and accurate to the best of my ability. I acknowledge that Practitioner's advice, recommendations, and/or decision may be based on factors not within their control, such as incomplete or inaccurate data provided by me or distortions of diagnostic images or specimens that may result from electronic transmissions. I understand that the practice of medicine is not an exact science and that Practitioner makes no warranties or guarantees regarding treatment outcomes. I acknowledge that a copy of this consent can be made available to me via my patient portal ([facility]), or I can request a printed copy by calling the office of [facility].    I understand that my insurance will be billed for this visit.   I have read or had this consent read to me. . I understand the contents of this consent, which adequately explains the benefits and risks of the Services being provided via telemedicine.  . I have been provided ample opportunity to ask questions regarding this consent and the Services and have had my questions answered to my satisfaction. . I give my informed consent for the services to be provided through the use of telemedicine in my medical care

## 2020-05-16 ENCOUNTER — Ambulatory Visit: Payer: Medicare HMO

## 2020-05-17 NOTE — Patient Instructions (Signed)
Visit Information  Goals Addressed              This Visit's Progress   .  I am weighing now (pt-stated)        CARE PLAN ENTRY (see longtitudinal plan of care for additional care plan information)   Current Barriers:  [name] Knowledge deficit related to basic heart failure pathophysiology and self care management  Case Manager Clinical Goal(s):  [name] Over the next 90 days, patient will weigh self daily and record  Interventions:  . Provided verbal education on low sodium diet . Reviewed Heart Failure Action Plan in depth  . Advised patient to weigh each morning after emptying bladder . Discussed importance of daily weight and advised patient to weigh and record daily . Spoke with the patient ands she seemed more relaxed. She said she was sitting on her porch with her husband talking with a neighbor enjoying  the conversation. [name] She weighed and her weight is 126 lbs.  She said that she checked her Bp but could not remember the numbers but she said it was good.  She denies andy chest pain shortness of breath , or swelling. . The patient said her goal is she wants to live. . She states that she wants to do better by taking her medications as she should amd follow instructions. . We reviewed HF action plan.  Patient Self Care Activities:  . Takes Heart Failure Medications as prescribed . Adheres to low sodium diet  Please see past updates related to this goal by clicking on the "Past Updates" button in the selected goal         Jennifer Foster was given information about Care Management services today including:  1. Care Management services include personalized support from designated clinical staff supervised by her physician, including individualized plan of care and coordination with other care providers 2. 24/7 contact phone numbers for assistance for urgent and routine care needs. 3. The patient may stop CCM services at any time (effective at the end of the month) by phone call to the  office staff.  Patient agreed to services and verbal consent obtained.   The patient verbalized understanding of instructions provided today and declined a print copy of patient instruction materials.   The care management team will reach out to the patient again over the next 14 days.   Lazaro Arms RN, BSN, [facility] Care Management Coordinator [place] Phone: [phone] Fax: [phone]

## 2020-05-17 NOTE — Chronic Care Management (AMB) (Signed)
  Care Management   Follow Up Note   05/17/2020 Name: Jennifer Foster MRN: [id number] DOB: [birth year]  Referred by: [name], MD Reason for referral : Chronic Care Management (HTN/CHF)   Jennifer Foster is a 74 y.o. year old female who is a primary care patient of [name], MD. The care management team was consulted for assistance with care management and care coordination needs.    Review of patient status, including review of consultants reports, relevant laboratory and other test results, and collaboration with appropriate care team members and the patient's provider was performed as part of comprehensive patient evaluation and provision of chronic care management services.    SDOH (Social Determinants of Health) assessments performed: No See Care Plan activities for detailed interventions related to [facility])     Advanced Directives: See Care Plan and Vyn[employer] application for related entries.   Goals Addressed              This Visit's Progress   .  I am weighing now (pt-stated)        CARE PLAN ENTRY (see longtitudinal plan of care for additional care plan information)   Current Barriers:  [name] Knowledge deficit related to basic heart failure pathophysiology and self care management  Case Manager Clinical Goal(s):  [name] Over the next 90 days, patient will weigh self daily and record  Interventions:  . Provided verbal education on low sodium diet . Reviewed Heart Failure Action Plan in depth  . Advised patient to weigh each morning after emptying bladder . Discussed importance of daily weight and advised patient to weigh and record daily . Spoke with the patient ands she seemed more relaxed. She said she was sitting on her porch with her husband talking with a neighbor enjoying  the conversation. [name] She weighed and her weight is 126 lbs.  She said that she checked her Bp but could not remember the numbers but she said it was good.  She denies andy chest pain shortness  of breath , or swelling. . The patient said her goal is she wants to live. . She states that she wants to do better by taking her medications as she should amd follow instructions. . We reviewed HF action plan.  Patient Self Care Activities:  . Takes Heart Failure Medications as prescribed . Adheres to low sodium diet  Please see past updates related to this goal by clicking on the "Past Updates" button in the selected goal          The care management team will reach out to the patient again over the next 14 days.   Lazaro Arms RN, BSN, [facility] Care Management Coordinator [place] Phone: [phone] Fax: [phone]

## 2020-05-21 ENCOUNTER — Ambulatory Visit (HOSPITAL_COMMUNITY): Admission: RE | Admit: 2020-05-21 | Payer: Medicare HMO | Source: Ambulatory Visit

## 2020-05-24 ENCOUNTER — Telehealth: Payer: Self-pay | Admitting: Licensed Clinical Social Worker

## 2020-05-24 NOTE — Chronic Care Management (AMB) (Signed)
    Clinical Social Work  Care Management Outreach   05/24/2020 Name: Jennifer Foster MRN: [id number] DOB: 28-Nov-1945  Jennifer Foster is a 74 y.o. year old female who is a primary care patient of [place], Phill Myron, MD .   LCSW reached out to [name] today by phone to assess progress on care plan and ongoing needs.  Patient unable to come to the phone.  Informed husband would call back in an hour.  Called back, the outreach was unsuccessful. Unable to leave a message, voice mail not setup. Plan: LCSW will call in 5 to 7 days  Review of patient status, including review of consultants reports, relevant laboratory and other test results, and collaboration with appropriate care team members and the patient's provider was performed as part of comprehensive patient evaluation and provision of care management services.    Casimer Lanius, [place] / [place]   [phone] 11:59 AM

## 2020-05-31 ENCOUNTER — Telehealth: Payer: Self-pay | Admitting: Licensed Clinical Social Worker

## 2020-05-31 NOTE — Chronic Care Management (AMB) (Signed)
   Social Work Care Management  2nd Unsuccessful  Phone Outreach   05/31/2020 Name: BRIANY AYE MRN: [id number] DOB: [birth year]  Referred by: [name], MD ,  Reason for referral : Care Coordination (F/U call)   HOSANNA BETLEY is a 74 y.o. year old female who sees [name], MD for primary care. 2nd unsuccessful telephone outreach attempt to Ms. [name] today. Unable to leave a  phone message.  Plan: CCM RN has outreach with patient next week, LCSW will reach out to Ms. [name] again over the next 7 to 10 days.   Review of patient status, including review of consultants reports, relevant laboratory and other test results, and collaboration with appropriate care team members and the patient's provider was performed as part of comprehensive patient evaluation and provision of care management services.   Casimer Lanius, [place] / [place]   [phone] 12:37 PM

## 2020-06-05 ENCOUNTER — Telehealth: Payer: Self-pay

## 2020-06-05 ENCOUNTER — Telehealth: Payer: Medicare HMO

## 2020-06-05 NOTE — Telephone Encounter (Signed)
°  Care Management   Outreach Note  06/05/2020 Name: Jennifer Foster MRN: [id number] DOB: 1946-04-14  Referred by: [name], MD Reason for referral : Appointment (CHF/HTN)   An unsuccessful telephone outreach was attempted today. The patient was referred to the case management team for assistance with care management and care coordination.   Unable to leave a voice mail no pick up x 2 Follow Up Plan: The care management team will reach out to the patient again over the next 7-10 days.   Lazaro Arms RN, BSN, [facility] Care Management Coordinator [place] Phone: [phone] Fax: [phone]

## 2020-06-06 ENCOUNTER — Telehealth: Payer: Self-pay | Admitting: *Deleted

## 2020-06-06 NOTE — Chronic Care Management (AMB) (Signed)
  Care Management   Note  06/06/2020 Name: Jennifer Foster MRN: [id number] DOB: [birth year]  Jennifer Foster is a 74 y.o. year old female who is a primary care patient of [name], MD and is actively engaged with the care management team. I reached out to [name] by phone today to assist with re-scheduling a follow up visit with the RN Case Manager  Follow up plan: Unsuccessful telephone outreach attempt made. A HIPAA compliant phone message was left for the patient providing contact information and requesting a return call.  The care management team will reach out to the patient again over the next 7 days.  If patient returns call to provider office, please advise to call [facility] at [phone].  [facility]

## 2020-06-11 NOTE — Chronic Care Management (AMB) (Signed)
  Care Management   Note  06/11/2020 Name: Jennifer Foster MRN: [id number] DOB: [birth year]  Jennifer Foster is a 74 y.o. year old female who is a primary care patient of [name], MD and is actively engaged with the care management team. I reached out to [name] by phone today to assist with re-scheduling a follow up visit with the RN Case Manager  Follow up plan: Unsuccessful telephone outreach attempt made. The care management team will reach out to the patient again over the next 7 days. If patient returns call to provider office, please advise to call [facility] at [phone].  [facility]

## 2020-06-13 ENCOUNTER — Ambulatory Visit: Payer: Medicare HMO | Admitting: Licensed Clinical Social Worker

## 2020-06-13 NOTE — Chronic Care Management (AMB) (Signed)
  Care Management   Note  06/13/2020 Name: Jennifer Foster MRN: [id number] DOB: November 02, 1945  Jennifer Foster is a 74 y.o. year old female who is a primary care patient of [name], MD and is actively engaged with the care management team. I reached out to [name] by phone today to assist with re-scheduling a follow up visit with the RN Case Manager.  Follow up plan: Telephone appointment with care management team member scheduled for:06/19/2020  [facility]

## 2020-06-13 NOTE — Patient Instructions (Signed)
  Jennifer Foster  it was nice speaking with you. Please call me directly [phone] if you have questions about the information we discussed.  Today our goal is getting you to counseling  Appointment scheduled Nov. 3rd at 4:00 at the [facility] with [facility]  [address], [place] [address] [phone] Please call them if you are unable to go to the appointment  Ms. Jennifer Foster received Care Management services today:  1. Care Management services include personalized support from designated clinical staff supervised by her physician, including individualized plan of care and coordination with other care providers 2. 24/7 contact [phone] for assistance for urgent and routine care needs. 3. Care Management are voluntary services and be declined at any time by calling the office.  The patient verbalized understanding of instructions provided today and agreed to receive a mailed copy of patient instruction and/or educational materials. Follow up plan: SW will follow up with patient by phone over the next 2 weeks  Maurine Cane, LCSW

## 2020-06-13 NOTE — Chronic Care Management (AMB) (Signed)
Care Management   Clinical Social Work Follow Up   06/13/2020 Name: Jennifer Foster MRN: [id number] DOB: 1946/02/04 Referred by: [name], MD  Reason for referral : Care Coordination (f/u)  Jennifer Foster is a 74 y.o. year old female who is a primary care patient of [name], MD.  Reason for follow-up: assess for barriers and progress with managing stressors and connecting with counseling .    Assessment: Patient continues to experience difficulty with connecting to counseling due to missing her appointment.  Reports things seem to be a little better as she is doing better with managing family stressors. Patient also reports stressors of recent traumatic events in her neighborhood. Recommendation: Patient may benefit from, and is in agreement to reschedule therapy appointment with [place]. See care plan below for details   Review of patient status, including review of consultants reports, relevant laboratory and other test results, and collaboration with appropriate care team members and the patient's provider was performed as part of comprehensive patient evaluation and provision of care management services.   Plan: LCSW will f/u with patient in 2 weeks  Advance Directive Status:; not addressed during this encounter.  SDOH (Social Determinants of Health) assessments performed: No needs identified   Goals Addressed            This Visit's Progress   . Connect with counseling   Not on track    [place] (see longitudinal plan of care for additional care plan information)  Current Barriers & progress:  . Patient with Anxiety and Depression acknowledges deficits with connecting to mental health provider for ongoing counseling.  . Patient needs Support, Education, and Care Coordination in order to meet unmet mental health needs  . Patient missed appointment with [facility] on Oct. 6th.  Reports she forgot Clinical Social Work Goal(s):  [name] Over the next  30 days, patient will connect for ongoing counseling.  Interventions:  . Assessed patient's needs, barriers how managing stressors and coping  . Collaborated with CCM RN regarding patient needs . Discussed several options for long term counseling based on need and insurance. Assisted patient with narrowing the options down to [facility]  ) . Reviewed mental health medications with patient prescribed by PCP and discussed compliance; patient taking medication daily reports no missed doses 10mg  Lezapro and 15mg  Remeon) . [place] with patient to scheduled another appointment  Nov. 3rd in person at 4:00 Patient Self Care Activities & Deficits:  . Patient is unable to independently navigate community resource options without care coordination support . Patient is able to implement clinical interventions discussed today  . Keep appointment at the [facility] with Veverly Fells  [address], [place] [address] [phone] . Patient is motivated for treatment Initial goal documentation and Please see past updates related to this goal by clicking on the "Past Updates" button in the selected goal        Outpatient Encounter Medications as of 06/13/2020  Medication Sig  . albuterol (VENTOLIN HFA) 108 (90 Base) MCG/ACT inhaler Inhale 2 puffs into the lungs every 6 (six) hours as needed for wheezing or shortness of breath.  [name] amLODipine (NORVASC) 10 MG tablet TAKE 1 TABLET(10 MG) BY MOUTH DAILY  . apixaban (ELIQUIS) 2.5 MG TABS tablet Take 1 tablet (2.5 mg total) by mouth 2 (two) times daily.  . Baclofen 5 MG TABS Take 1 tablet by mouth every 12 (twelve) hours as needed.  [name]  calcitRIOL (ROCALTROL) 0.25 MCG capsule Take 0.25 mcg by mouth daily.   . diclofenac Sodium (VOLTAREN) 1 % GEL Apply 4 g topically 4 (four) times daily. Apply to hip joint  . escitalopram (LEXAPRO) 10 MG tablet TAKE 1 TABLET(10 MG) BY MOUTH DAILY  . famotidine (PEPCID) 20 MG tablet Take 1 tablet (20 mg  total) by mouth daily.  . furosemide (LASIX) 20 MG tablet Take 1 tablet (20 mg total) by mouth daily.  . hydrALAZINE (APRESOLINE) 25 MG tablet Take 1 tablet (25 mg total) by mouth 3 (three) times daily.  . isosorbide mononitrate (IMDUR) 30 MG 24 hr tablet TAKE 1 TABLET(30 MG) BY MOUTH DAILY  . mirtazapine (REMERON) 15 MG tablet Take 1 tablet (15 mg total) by mouth at bedtime.  . nitroGLYCERIN (NITROSTAT) 0.4 MG SL tablet Place 1 tablet (0.4 mg total) under the tongue every 5 (five) minutes as needed for chest pain. If do not resolve after 1 tablet, go to ED  . potassium chloride SA (KLOR-CON) 20 MEQ tablet TAKE HALF TABLET BY MOUTH EVERY DAY  . rosuvastatin (CRESTOR) 20 MG tablet Take 1 tablet (20 mg total) by mouth at bedtime.   No facility-administered encounter medications on file as of 06/13/2020.   Casimer Lanius, [place] / [place]   [phone] 4:06 PM

## 2020-06-16 ENCOUNTER — Other Ambulatory Visit: Payer: Self-pay | Admitting: Family Medicine

## 2020-06-19 ENCOUNTER — Ambulatory Visit: Payer: Medicare (Managed Care)

## 2020-06-19 NOTE — Chronic Care Management (AMB) (Signed)
RN  Care Management   Follow Up Note   06/19/2020 Name: Jennifer Foster MRN: [id number] DOB: [birth year]  Reason for referral : Chronic Care Management (CHF/ HTN)   Jennifer Foster is a 74 y.o. year old female who is a primary care patient of [name], MD. The care management team was consulted for assistance with care management and care coordination needs.    Subjective: " I am doing as well as can be expected"  Objective:  BP Readings from Last 3 Encounters:  05/11/20 (!) 174/87  04/06/20 (!) 142/80  01/24/20 (!) 160/82   Wt Readings from Last 3 Encounters:  05/11/20 126 lb (57.2 kg)  04/06/20 123 lb 6.4 oz (56 kg)  01/24/20 128 lb 9.6 oz (58.3 kg)    Assessment: Called to follow up with the patient on her HTN and CHF.  The patient was saddened with grief her sister passed last Saturday.  Still, she is taking her meds.  Goals    .  Advance Directive      CARE PLAN ENTRY (see longitudinal plan of care for additional care plan information)  Current Barriers:  . Patient does not have an Forensic scientist . Patient acknowledges deficits, education and support in order to complete this document Clinical Social Work Goal(s): Over the next 30 to 60 days,  . the patient will review and complete Advance Directive packet, have notarized and provide a copy to provider office . review mailed EMMI education on Advance Directive as evidenced by patient self report of review Interventions provided by LCSW: . Assessed understanding of Advance Directives . A voluntary discussion about advanced care planning including importance of advanced directives, healthcare proxy and living will was discussed with the patient.  . Mailed the patient EMMI educational information on Advance Directives as well as an Forensic scientist packet Patient Self Care Activities:  . Is able to complete documentation independently . Able to identify [place] / [place] . patient will review information mailed by LCSW Initial goal documentation    .  Connect with counseling      CARE PLAN ENTRY (see longitudinal plan of care for additional care plan information)  Current Barriers & progress:  . Patient with Anxiety and Depression acknowledges deficits with connecting to mental health provider for ongoing counseling.  . Patient needs Support, Education, and Care Coordination in order to meet unmet mental health needs  . Patient missed appointment with [facility] on Oct. 6th.  Reports she forgot Clinical Social Work Goal(s):  [name] Over the next 30 days, patient will connect for ongoing counseling.  Interventions:  . Assessed patient's needs, barriers how managing stressors and coping  . Collaborated with CCM RN regarding patient needs . Discussed several options for long term counseling based on need and insurance. Assisted patient with narrowing the options down to [facility]  ) . Reviewed mental health medications with patient prescribed by PCP and discussed compliance; patient taking medication daily reports no missed doses 10mg  Lezapro and 15mg  Remeon) . [place] with patient to scheduled another appointment  Nov. 3rd in person at 4:00 Patient Self Care Activities & Deficits:  . Patient is unable to independently navigate community resource options without care coordination support . Patient is able to implement clinical interventions discussed today  . Keep appointment at the [facility] with Veverly Fells  [address], [place] [address] [phone] . Patient is motivated for  treatment Initial goal documentation and Please see past updates related to this goal by clicking on the "Past Updates" button in the selected goal      .  I am weighing now (pt-stated)      [place] (see longtitudinal plan of care for additional care plan information)   Current Barriers:  [name] Knowledge deficit related to basic  heart failure pathophysiology and self care management  Case Manager Clinical Goal(s):  [name] Over the next 90 days, patient will weigh self daily and record  Interventions:  . Provided verbal education on low sodium diet . Reviewed Heart Failure Action Plan in depth  . Advised patient to weigh each morning after emptying bladder . Discussed importance of daily weight and advised patient to weigh and record daily . Spoke with the patient and she was calm but sad the patient stated that her sister that she had been at her house last Saturday and ate dinner with and just left passed away that night.  I gave her my condolences.  She said that she is coping.  I advised her that I would inform LCSW Laurance Flatten to call her.   . Reviewed medications the patient is taking Lexapro and Remeron.  Patient states that she feels like it is helping.  "It helps to keep me calm"  . She states that she is taking her meds and she is not having any chest pain shortness of breath of swelling. . reviewed HF action plan briefly to allow her time to relax.  Patient Self Care Activities:  . Takes Heart Failure Medications as prescribed . Adheres to low sodium diet  Please see past updates related to this goal by clicking on the "Past Updates" button in the selected goal           Review of patient status, including review of consultants reports, relevant laboratory and other test results, and collaboration with appropriate care team members and the patient's provider was performed as part of comprehensive patient evaluation and provision of chronic care management services.    SDOH (Social Determinants of Health) assessments performed: No See Care Plan activities for detailed interventions related to SDOH)     Plan: Telephone follow up with Jennifer Foster over the next 14 days.            Contact LCSW Moore to follow up with the patient.    Lazaro Arms RN, BSN, [facility] Care Management Coordinator [place] Phone: [phone] Fax: [phone]

## 2020-06-19 NOTE — Patient Instructions (Signed)
Visit Information  Goals Addressed              This Visit's Progress   .  I am weighing now (pt-stated)        CARE PLAN ENTRY (see longtitudinal plan of care for additional care plan information)   Current Barriers:  [name] Knowledge deficit related to basic heart failure pathophysiology and self care management  Case Manager Clinical Goal(s):  [name] Over the next 90 days, patient will weigh self daily and record  Interventions:  . Provided verbal education on low sodium diet . Reviewed Heart Failure Action Plan in depth  . Advised patient to weigh each morning after emptying bladder . Discussed importance of daily weight and advised patient to weigh and record daily . Spoke with the patient and she was calm but sad the patient stated that her sister that she had been at her house last Saturday and ate dinner with and just left passed away that night.  I gave her my condolences.  She said that she is coping.  I advised her that I would inform LCSW Laurance Flatten to call her.   . Reviewed medications the patient is taking Lexapro and Remeron.  Patient states that she feels like it is helping.  "It helps to keep me calm"  . She states that she is taking her meds and she is not having any chest pain shortness of breath of swelling. . reviewed HF action plan briefly to allow her time to relax.  Patient Self Care Activities:  . Takes Heart Failure Medications as prescribed . Adheres to low sodium diet  Please see past updates related to this goal by clicking on the "Past Updates" button in the selected goal         Ms. Zurn was given information about Care Management services today including:  1. Care Management services include personalized support from designated clinical staff supervised by her physician, including individualized plan of care and coordination with other care providers 2. 24/7 contact phone numbers for assistance for urgent and routine care needs. 3. The patient may stop CCM  services at any time (effective at the end of the month) by phone call to the office staff.  Patient agreed to services and verbal consent obtained.   The patient verbalized understanding of instructions provided today and declined a print copy of patient instruction materials.   Plan: Telephone follow up with [name] over the next 14 days.            Contact LCSW Moore to follow up with the patient.    Lazaro Arms RN, BSN, [facility] Care Management Coordinator [place] Phone: [phone] Fax: [phone]

## 2020-07-03 ENCOUNTER — Telehealth: Payer: Medicare (Managed Care)

## 2020-07-03 ENCOUNTER — Telehealth: Payer: Self-pay

## 2020-07-03 NOTE — Telephone Encounter (Signed)
  Care Management   Outreach Note  07/03/2020 Name: BARBIE CROSTON MRN: [id number] DOB: 1946-08-30  Referred by: [name], MD Reason for referral : Appointment (CHF HTN)   An unsuccessful telephone outreach was attempted today. The patient was referred to the case management team for assistance with care management and care coordination.  2 attempts made no voicemail pickup.  Follow Up Plan: The care management team will reach out to the patient again over the next 7-14 days.   Lazaro Arms RN, BSN, [facility] Care Management Coordinator [place] Phone: [phone] Fax: [phone]

## 2020-07-04 ENCOUNTER — Ambulatory Visit: Payer: Medicare (Managed Care) | Admitting: Licensed Clinical Social Worker

## 2020-07-04 DIAGNOSIS — Z7189 Other specified counseling: Secondary | ICD-10-CM

## 2020-07-04 NOTE — Chronic Care Management (AMB) (Signed)
Care Management   Clinical Social Work Follow Up   07/04/2020 Name: GRAINNE KNIGHTS MRN: [id number] DOB: Apr 24, 1946 Referred by: [name], MD  Reason for referral : Care Coordination (connect for counseling )  FRANNIE SHEDRICK is a 74 y.o. year old female who is a primary care patient of [place], Phill Myron, MD.  Reason for follow-up: called to remind patient of appointment today with the [facility] for counseling.    Assessment: Patient unable to keep appointment due to pain in her leg.  Assisted patient with rescheduling counseling appointment at [facility] .  Recommendation: Patient may benefit from, and is in agreement to call [facility] office to schedule appointment with PCP to discuss concerns about her leg.  Plan:LCSW will f/u with patient in 30 days.  CCM RN will continue to provide ongoing support.   Advance Directive Status: ; not addressed during this encounter.  SDOH (Social Determinants of Health) assessments performed:; No new needs identified   Patient Care Plan: Depression (Adult)  Problem Identified: symptoms of Depression   Goal: Connect with counseling   Start Date: 06/13/2020  Expected End Date: 08/31/2020  This Visit's Progress: Not on track  Priority: High  Current Barriers & progress:  . Patient with Anxiety and Depression missed appointment with [facility] on Oct. 6th.  Reports she forgot.  Unable to make appointment today due to pain in her leg.   Clinical Social Work Goal(s): Over the next 30 to 45 days, patient will connect for ongoing counseling.  Interventions:  . Assessed patient's needs, barriers how managing stressors and coping  . Encouraged patient to call office to schedule appointment with PCP ref. Pain in her leg . Reviewed mental health medications with patient prescribed by PCP and discussed compliance; patient taking medication daily reports no missed doses 10mg  Lezapro and 15mg  Remeon) . [place] with patient to rescheduled  appointment   . New appointment Nov. 30th person at 4:00 Patient Activities :  . Keep appointment at the [facility] for counseling with Veverly Fells  Nov.30th at 4:00   [address], [place] [address] [phone] . Please call if you are unable to keep the appointment      Outpatient Encounter Medications as of 07/04/2020  Medication Sig  . albuterol (VENTOLIN HFA) 108 (90 Base) MCG/ACT inhaler Inhale 2 puffs into the lungs every 6 (six) hours as needed for wheezing or shortness of breath.  [name] amLODipine (NORVASC) 10 MG tablet TAKE 1 TABLET(10 MG) BY MOUTH DAILY  . apixaban (ELIQUIS) 2.5 MG TABS tablet Take 1 tablet (2.5 mg total) by mouth 2 (two) times daily.  . Baclofen 5 MG TABS Take 1 tablet by mouth every 12 (twelve) hours as needed.  . calcitRIOL (ROCALTROL) 0.25 MCG capsule Take 0.25 mcg by mouth daily.   . diclofenac Sodium (VOLTAREN) 1 % GEL Apply 4 g topically 4 (four) times daily. Apply to hip joint  . escitalopram (LEXAPRO) 10 MG tablet TAKE 1 TABLET(10 MG) BY MOUTH DAILY  . famotidine (PEPCID) 20 MG tablet Take 1 tablet (20 mg total) by mouth daily.  . furosemide (LASIX) 20 MG tablet Take 1 tablet (20 mg total) by mouth daily.  . hydrALAZINE (APRESOLINE) 25 MG tablet Take 1 tablet (25 mg total) by mouth 3 (three) times daily.  . isosorbide mononitrate (IMDUR) 30 MG 24 hr tablet TAKE 1 TABLET(30 MG) BY MOUTH DAILY  . mirtazapine (REMERON) 15 MG tablet Take 1 tablet (15 mg  total) by mouth at bedtime.  . nitroGLYCERIN (NITROSTAT) 0.4 MG SL tablet Place 1 tablet (0.4 mg total) under the tongue every 5 (five) minutes as needed for chest pain. If do not resolve after 1 tablet, go to ED  . potassium chloride SA (KLOR-CON) 20 MEQ tablet TAKE HALF TABLET BY MOUTH EVERY DAY  . rosuvastatin (CRESTOR) 20 MG tablet Take 1 tablet (20 mg total) by mouth at bedtime.   Review of patient status, including review of consultants reports, relevant laboratory and other test results,  and collaboration with appropriate care team members and the patient's provider was performed as part of comprehensive patient evaluation and provision of care management services.   Casimer Lanius, [place] / [place]   [phone] 2:59 PM

## 2020-07-04 NOTE — Patient Instructions (Signed)
  Jennifer Foster  it was nice speaking with you. Please call me directly [phone] if you have questions about the goals we discussed. Goals Addressed            This Visit's Progress   . Connect with counseling   Not on track    Patient Activities :  . Keep appointment at the [facility] for counseling with Veverly Fells  Nov.30th at 4:00   [address], [place] [address] [phone] . Please call if you are unable to keep the appointment       Jennifer Foster received Care Management services today:  1. Care Management services include personalized support from designated clinical staff supervised by her physician, including individualized plan of care and coordination with other care providers 2. 24/7 contact [phone] for assistance for urgent and routine care needs. 3. Care Management are voluntary services and be declined at any time by calling the office.  The patient verbalized understanding of instructions provided today and agreed to receive a mailed copy of patient instruction and/or educational materials. Follow up plan: SW will follow up with patient by phone over the next 30 days  Maurine Cane, LCSW

## 2020-07-05 NOTE — Telephone Encounter (Signed)
Called spoke to patient reschedule for follow up call with RN CM on 07/11/2020  [address], [place], [place] [address] Direct Dial: [email or web address] Website: [email or web address]

## 2020-07-11 ENCOUNTER — Ambulatory Visit: Payer: Medicare (Managed Care)

## 2020-07-11 NOTE — Patient Instructions (Signed)
Visit Information  Goals Addressed              This Visit's Progress   .  Heart Failure (pt-stated)        Patient Goals/Self care Activities: . Takes Heart Failure Medications as prescribed . Verbalizes understanding of and follows CHF Action Plan . Adheres to low sodium diet    .  Hypertention Goals             Patient Goal/Self Care Activities:  . Self administers medications as prescribed . Attends all scheduled provider appointments . Calls provider office for new concerns, questions, or BP outside discussed parameters . Checks BP and records as discussed . Follows a low sodium diet/DASH diet  - agree to work together to make changes - ask questions to understand       Ms. Bouska was given information about Care Management services today including:  1. Care Management services include personalized support from designated clinical staff supervised by her physician, including individualized plan of care and coordination with other care providers 2. 24/7 contact phone numbers for assistance for urgent and routine care needs. 3. The patient may stop CCM services at any time (effective at the end of the month) by phone call to the office staff.  Patient agreed to services and verbal consent obtained.   The patient verbalized understanding of instructions provided today and declined a print copy of patient instruction materials.   The care management team will reach out to the patient again over the next 14 days.   Lazaro Arms RN, BSN, [facility] Care Management Coordinator [place] Phone: [phone] Fax: [phone]

## 2020-07-11 NOTE — Chronic Care Management (AMB) (Signed)
RN  Care Management   Follow Up Note   07/11/2020 Name: Jennifer Foster MRN: [id number] DOB: 1946/04/20  Reason for referral : No chief complaint on file.   Jennifer Foster is a 74 y.o. year old female who is a primary care patient of Eniola, Phill Myron, MD. The care management team was consulted for assistance with care management and care coordination needs.      Assessment: Called the patient to follow up with her about her CHF and HTN. She complains that she has been having pain in her right hip and leg for 1 week .  Patient Care Plan: Heart Failure (Adult)  Problem Identified: Symptom Exacerbation (Heart Failure)   Long-Range Goal: Symptom Exacerbation Prevented or Minimized   Note:    Current Barriers:  [name] Knowledge deficit related to basic heart failure pathophysiology and self care management  Case Manager Clinical Goal(s):  [name] Over the next 90 days, patient will weigh self daily and record  Interventions:  . Basic overview and discussion of  Heart Failure reviewed . Provided verbal education on low sodium diet . Provided written education on low sodium diet . Reviewed Heart Failure Action Plan in depth and provided written copy . Assessed need for readable accurate scales in home . Provided verbal education on low sodium diet . Reviewed Heart Failure Action Plan in depth and provided written copy . Provided education about placing scale on hard, flat surface . Advised patient to weigh each morning after emptying bladder . Discussed importance of daily weight and advised patient to weigh and record daily . - medication-adherence assessment completed . - - self-awareness of signs/symptoms of worsening disease encouraged  Patient Goals/Self Care Activities:  . Takes Heart Failure Medications as prescribed . Verbalizes understanding of and follows CHF Action Plan . Adheres to low sodium diet  Follow up plan: The care management team will reach out to the patient again  over the next 14 days.    Patient Care Plan: Hypertension (Adult)  Problem Identified: Hypertension (Hypertension)   Long-Range Goal: Hypertension Monitored   Note:   Current Barriers:  [name] Knowledge Deficits related to basic understanding of hypertension pathophysiology and self care management  Nurse Case Manager Clinical Goal(s):  [name] Over the next 30 days, patient will verbalize understanding of plan for hypertension management . Over the next 90 days, patient will demonstrate improved adherence to prescribed treatment plan for hypertension as evidenced by taking all medications as prescribed, monitoring and recording blood pressure as directed, adhering to low sodium/DASH diet  Interventions:  . Evaluation of current treatment plan related to hypertension self management and patient's adherence to plan as established by provider. . Provided education to patient re: stroke prevention, s/s of heart attack and stroke, DASH diet, complications of uncontrolled blood pressure . Reviewed medications with patient and discussed importance of compliance . Discussed plans with patient for ongoing care management follow up and provided patient with direct contact information for care management team . Advised patient, providing education and rationale, to monitor blood pressure daily and record, calling PCP for findings outside established parameters.  . Reviewed scheduled/upcoming provider appointments including:  - medication list reviewed - medication-adherence assessment completed - medication reminder use encouraged - strategies for improving adherence encouraged - understanding of current medications assessed   Patient Goal/Self Care Activities:  . Self administers medications as prescribed . Attends all scheduled provider appointments . Calls provider office for new concerns, questions, or BP outside discussed parameters .  Checks BP and records as discussed . Follows a low sodium  diet/DASH diet   Follow up plan: The care management team will reach out to the patient again over the next 14 days.    Problem Identified: Pain   Goal: Pain Managed   Start Date: 07/11/2020  Note:   . Chronic Disease Management support, education, and care coordination needs related to   Right Hip and leg  pain as evidenced by patient stating patient stating she has pain like pins and needles in her right leg for 1 week and she is unable to sleep.   Clinical Goal(s) related to  Right Hip and Leg Pain :  Over the next 10 days, patient will:  . Work with the care management team to address educational, disease management, and care coordination needs  . Call provider office for new or worsened signs and symptoms  . Call care management team with questions or concerns . Verbalize basic understanding of patient centered plan of care established today  Interventions related to  Right Hip and Leg Pain :  . Evaluation of current treatment plans and patient's adherence to plan as established by provider . Assessed patient understanding of disease states . Assessed patient's education and care coordination needs . Provided disease specific education to patient  . Collaborated with appropriate clinical care team members regarding patient needs . RNCM Help make patient an appointment at the office for 07/12/20 at 1030 am.  Patient Goals/Self Care Activities related to  Right Hip and Leg Pain :  . Patient will follow up with the doctor . Patient will continue to use comfort modalities for her pain like heat /cold , tylenol   The care management team will reach out to the patient again over the next 10 days.       Review of patient status, including review of consultants reports, relevant laboratory and other test results, and collaboration with appropriate care team members and the patient's provider was performed as part of comprehensive patient evaluation and provision of chronic care  management services.    SDOH (Social Determinants of Health) assessments performed: No See Care Plan activities for detailed interventions related to [facility])       The care management team will reach out to the patient again over the next 14 days.    Lazaro Arms RN, BSN, [facility] Care Management Coordinator [place] Phone: [phone] Fax: [phone]

## 2020-07-12 ENCOUNTER — Ambulatory Visit (INDEPENDENT_AMBULATORY_CARE_PROVIDER_SITE_OTHER): Payer: Medicare (Managed Care) | Admitting: Family Medicine

## 2020-07-12 ENCOUNTER — Other Ambulatory Visit: Payer: Self-pay

## 2020-07-12 VITALS — BP 142/80 | HR 59 | Ht 64.0 in | Wt 128.2 lb

## 2020-07-12 DIAGNOSIS — M79651 Pain in right thigh: Secondary | ICD-10-CM | POA: Diagnosis not present

## 2020-07-12 DIAGNOSIS — Z23 Encounter for immunization: Secondary | ICD-10-CM | POA: Diagnosis not present

## 2020-07-12 DIAGNOSIS — M5431 Sciatica, right side: Secondary | ICD-10-CM | POA: Diagnosis not present

## 2020-07-12 MED ORDER — BACLOFEN 5 MG PO TABS: 1.0000 | ORAL_TABLET | Freq: Three times a day (TID) | ORAL | 0 refills | Status: DC | PRN

## 2020-07-12 NOTE — Assessment & Plan Note (Addendum)
Suspect etiology around piriformis given significant muscle tension and worsening upon palpation, less concern for lumbar radiculopathy. No point tenderness along bursa to suggest trochanteric bursitis. Reassuringly without associated weakness and otherwise reassuring muscular exam. Provided with piriformis stretches and demonstrated in office on chair. Rx'd low-dose baclofen to use PRN, in addition recommended Tylenol every 4-6 hours. Continue heat and Voltaren gel.

## 2020-07-12 NOTE — Progress Notes (Signed)
    SUBJECTIVE:   CHIEF COMPLAINT / HPI: Leg pain   Jennifer Foster is a 74 year old female presenting for evaluation of right buttocks to leg pain.  She reports it started last week, no preceding injury. Getting worse in severity. Haven't had this before. Starts on the right side of her mid buttocks and then wraps around to her lateral thigh and goes down to lateral side of her ankle. Feelings like "its pulling, just hurts." Denies tingling or burning sensation. Movement and sitting on her bottom makes it worse, laying down on her left side relieves the pain. Voltaren gel has helped a little bit. Ice/heat helps some. Denies any weakness, numbness, associated back pain, or changes in bowel/bladder function. Can walk as normal. She has not tried any Tylenol or other pain medication.  PERTINENT  PMH / PSH: Hypertension, thoracic aortic aneurysm, A. fib, CAD, HFpEF, incidental adrenal adenoma, CKD stage III, history of thigh pain  OBJECTIVE:   BP (!) 142/80   Pulse (!) 59   Ht 5\' 4"  (1.626 m)   Wt 128 lb 4 oz (58.2 kg)   SpO2 97%   BMI 22.01 kg/m   General: Alert, NAD, however does appear uncomfortable with some movements HEENT: NCAT, MMM Lungs: No increased WOB  Ext: Warm, dry, 2+ distal pulses MSK: No spinal or leg deformity noted. Exquisite tenderness to palpation along piriformis muscle on the right, no tenderness to palpation along lateral thigh over bursa or down lateral entire right leg. No tenderness with lumbar spinous process. Full ROM at hip and knee joint on the right. 5/5 lower extremity strength bilaterally. Sensation to light touch intact throughout. Normal gait with slight limp for right leg.   ASSESSMENT/PLAN:   Sciatica of right side without back pain Suspect etiology around piriformis given significant muscle tension and worsening upon palpation, less concern for lumbar radiculopathy. No point tenderness along bursa to suggest trochanteric bursitis. Reassuringly without  associated weakness and otherwise reassuring muscular exam. Provided with piriformis stretches and demonstrated in office on chair. Rx'd low-dose baclofen to use PRN, in addition recommended Tylenol every 4-6 hours. Continue heat and Voltaren gel.      Received flu shot today.  Follow-up if not improving in the next few weeks or sooner if worsening including increased severity of pain, any associated weakness/numbness, back pain, or associated bowel/bladder changes.  Patriciaann Clan, [place]

## 2020-07-12 NOTE — Patient Instructions (Signed)
It was wonderful to see you today.  I believe you have a lot of tension in a muscle in your bottom which will cause pain to go down your leg.  Would encourage you to start doing the stretches that I provided.  You additionally can use Tylenol 650 mg every 4-6 hours as needed for discomfort.  You can also use the baclofen which is a muscle relaxer.  Realizes this medication can make you feel drowsy, so be careful with this.  You can continue to use ice/heat and the Voltaren gel to help with the discomfort.  If the pain is not improving or becoming more severe in the next several weeks or associated with any weakness, diffuse numbness, or changes in your bowel/bladder function please follow-up as soon as possible.

## 2020-07-25 ENCOUNTER — Telehealth: Payer: Self-pay | Admitting: Licensed Clinical Social Worker

## 2020-07-25 NOTE — Chronic Care Management (AMB) (Signed)
    Clinical Social Work  Care Management Outreach   07/25/2020 Name: Jennifer Foster MRN: [id number] DOB: [birth year]  Jennifer Foster is a 74 y.o. year old female who is a primary care patient of [name], MD .   LCSW reached out to Jennifer Foster today by phone to reminder her of therapy appointment Nov. 30th at the [facility]. Patient appreciative of the reminder.    Plan: LCSW will continue to collaborate with CCM RN for patient's ongoing needs.   Review of patient status, including review of consultants reports, relevant laboratory and other test results, and collaboration with appropriate care team members and the patient's provider was performed as part of comprehensive patient evaluation and provision of care management services.    Jennifer Foster, [place] [facility] [place]   [phone] 1:44 PM

## 2020-07-31 ENCOUNTER — Ambulatory Visit: Payer: Medicare (Managed Care) | Admitting: Family Medicine

## 2020-08-29 ENCOUNTER — Ambulatory Visit: Payer: Medicare (Managed Care)

## 2020-09-02 ENCOUNTER — Other Ambulatory Visit: Payer: Self-pay | Admitting: Family Medicine

## 2020-09-12 DIAGNOSIS — I129 Hypertensive chronic kidney disease with stage 1 through stage 4 chronic kidney disease, or unspecified chronic kidney disease: Secondary | ICD-10-CM | POA: Diagnosis not present

## 2020-09-12 DIAGNOSIS — M25551 Pain in right hip: Secondary | ICD-10-CM | POA: Diagnosis not present

## 2020-09-12 DIAGNOSIS — N184 Chronic kidney disease, stage 4 (severe): Secondary | ICD-10-CM | POA: Diagnosis not present

## 2020-09-12 DIAGNOSIS — D631 Anemia in chronic kidney disease: Secondary | ICD-10-CM | POA: Diagnosis not present

## 2020-09-12 DIAGNOSIS — N2581 Secondary hyperparathyroidism of renal origin: Secondary | ICD-10-CM | POA: Diagnosis not present

## 2020-09-17 ENCOUNTER — Telehealth: Payer: Medicare HMO

## 2020-09-17 ENCOUNTER — Other Ambulatory Visit: Payer: Self-pay

## 2020-09-17 ENCOUNTER — Telehealth: Payer: Self-pay | Admitting: *Deleted

## 2020-09-17 NOTE — Chronic Care Management (AMB) (Signed)
  Care Management   Note  09/17/2020 Name: CHARLESE GRUETZMACHER MRN: [id number] DOB: 1946/05/17  JOSEPHA BARBIER is a 75 y.o. year old female who is a primary care patient of [name], MD and is actively engaged with the care management team. I reached out to [name] by phone today to assist with re-scheduling a follow up visit with the RN Case Manager  Follow up plan: Unsuccessful telephone outreach attempt made. The care management team will reach out to the patient again over the next 7 days. If patient returns call to provider office, please advise to call [place] at [phone].  [place] Management

## 2020-09-18 ENCOUNTER — Ambulatory Visit: Payer: Medicare (Managed Care) | Admitting: Family Medicine

## 2020-09-21 NOTE — Chronic Care Management (AMB) (Signed)
°  Care Management   Note  09/21/2020 Name: LEAFY MOTSINGER MRN: [id number] DOB: [birth year]  KEARIA YIN is a 75 y.o. year old female who is a primary care patient of [name], MD and is actively engaged with the care management team. I reached out to [name] by phone today to assist with re-scheduling a follow up visit with the RN Case Manager.  Follow up plan: Telephone appointment with care management team member scheduled for:09/28/2020  [place] Management  Direct Dial: [phone]

## 2020-09-28 ENCOUNTER — Ambulatory Visit: Payer: Medicare HMO

## 2020-09-28 ENCOUNTER — Other Ambulatory Visit: Payer: Self-pay

## 2020-09-28 ENCOUNTER — Ambulatory Visit (INDEPENDENT_AMBULATORY_CARE_PROVIDER_SITE_OTHER): Payer: Medicare HMO | Admitting: Family Medicine

## 2020-09-28 ENCOUNTER — Encounter: Payer: Self-pay | Admitting: Family Medicine

## 2020-09-28 VITALS — BP 142/82 | HR 74 | Ht 64.0 in | Wt 126.0 lb

## 2020-09-28 DIAGNOSIS — N1832 Chronic kidney disease, stage 3b: Secondary | ICD-10-CM

## 2020-09-28 DIAGNOSIS — Z23 Encounter for immunization: Secondary | ICD-10-CM | POA: Diagnosis not present

## 2020-09-28 DIAGNOSIS — I712 Thoracic aortic aneurysm, without rupture, unspecified: Secondary | ICD-10-CM

## 2020-09-28 DIAGNOSIS — I4891 Unspecified atrial fibrillation: Secondary | ICD-10-CM | POA: Diagnosis not present

## 2020-09-28 DIAGNOSIS — I5032 Chronic diastolic (congestive) heart failure: Secondary | ICD-10-CM | POA: Diagnosis not present

## 2020-09-28 DIAGNOSIS — R634 Abnormal weight loss: Secondary | ICD-10-CM | POA: Diagnosis not present

## 2020-09-28 DIAGNOSIS — Z0000 Encounter for general adult medical examination without abnormal findings: Secondary | ICD-10-CM | POA: Diagnosis not present

## 2020-09-28 DIAGNOSIS — I10 Essential (primary) hypertension: Secondary | ICD-10-CM

## 2020-09-28 DIAGNOSIS — D6869 Other thrombophilia: Secondary | ICD-10-CM

## 2020-09-28 DIAGNOSIS — I700 Atherosclerosis of aorta: Secondary | ICD-10-CM

## 2020-09-28 DIAGNOSIS — Z122 Encounter for screening for malignant neoplasm of respiratory organs: Secondary | ICD-10-CM | POA: Diagnosis not present

## 2020-09-28 DIAGNOSIS — F1411 Cocaine abuse, in remission: Secondary | ICD-10-CM

## 2020-09-28 MED ORDER — ZOSTER VAC RECOMB ADJUVANTED 50 MCG/0.5ML IM SUSR: INTRAMUSCULAR | 1 refills | Status: DC

## 2020-09-28 NOTE — Patient Instructions (Signed)
Zoster Vaccine, Recombinant injection What is this medicine? ZOSTER VACCINE (ZOS ter vak SEEN) is a vaccine used to reduce the risk of getting shingles. This vaccine is not used to treat shingles or nerve pain from shingles. This medicine may be used for other purposes; ask your health care provider or pharmacist if you have questions. COMMON BRAND NAME(S): [facility] What should I tell my health care provider before I take this medicine? They need to know if you have any of these conditions:  cancer  immune system problems  an unusual or allergic reaction to Zoster vaccine, other medications, foods, dyes, or preservatives  pregnant or trying to get pregnant  breast-feeding How should I use this medicine? This vaccine is injected into a muscle. It is given by a health care provider. A copy of Vaccine Information Statements will be given before each vaccination. Be sure to read this information carefully each time. This sheet may change often. Talk to your health care provider about the use of this vaccine in children. This vaccine is not approved for use in children. Overdosage: If you think you have taken too much of this medicine contact a poison control center or emergency room at once. NOTE: This medicine is only for you. Do not share this medicine with others. What if I miss a dose? Keep appointments for follow-up (booster) doses. It is important not to miss your dose. Call your health care provider if you are unable to keep an appointment. What may interact with this medicine?  medicines that suppress your immune system  medicines to treat cancer  steroid medicines like prednisone or cortisone This list may not describe all possible interactions. Give your health care provider a list of all the medicines, herbs, non-prescription drugs, or dietary supplements you use. Also tell them if you smoke, drink alcohol, or use illegal drugs. Some items may interact with your medicine. What  should I watch for while using this medicine? Visit your health care provider regularly. This vaccine, like all vaccines, may not fully protect everyone. What side effects may I notice from receiving this medicine? Side effects that you should report to your doctor or health care professional as soon as possible:  allergic reactions (skin rash, itching or hives; swelling of the face, lips, or tongue)  trouble breathing Side effects that usually do not require medical attention (report these to your doctor or health care professional if they continue or are bothersome):  chills  headache  fever  nausea  pain, redness, or irritation at site where injected  tiredness  vomiting This list may not describe all possible side effects. Call your doctor for medical advice about side effects. You may report side effects to FDA at 1-800-FDA-1088. Where should I keep my medicine? This vaccine is only given by a health care provider. It will not be stored at home. NOTE: This sheet is a summary. It may not cover all possible information. If you have questions about this medicine, talk to your doctor, pharmacist, or health care provider.  2021 Elsevier/Gold Standard (2019-09-23 16:23:07)

## 2020-09-28 NOTE — Assessment & Plan Note (Signed)
Due to Eliquis. No acute bleed.

## 2020-09-28 NOTE — Assessment & Plan Note (Signed)
Stable. She follow closely with nephro.

## 2020-09-28 NOTE — Progress Notes (Signed)
Subjective:     Jennifer Foster is a 75 y.o. female and is here for a comprehensive physical exam. The patient reports . Weight loss: Drinks one bottle of Ensure daily as needed. Smoking: Started when she was a teenager.  Social History   Socioeconomic History  . Marital status: Married    Spouse name: Not on file  . Number of children: Not on file  . Years of education: Not on file  . Highest education level: Not on file  Occupational History  . Occupation: unemployed  Tobacco Use  . Smoking status: Former Smoker    Packs/day: 0.25    Types: Cigarettes  . Smokeless tobacco: Never Used  Vaping Use  . Vaping Use: Never used  Substance and Sexual Activity  . Alcohol use: Yes    Alcohol/week: 0.0 standard drinks    Comment: occasionally  . Drug use: No  . Sexual activity: Yes  Other Topics Concern  . Not on file  Social History Narrative   Lives with husband Jennifer Foster who is also an [facility] patient.  History of Marijuana and crack use.  Had cocaine + in May 2012   Social Determinants of Health   Financial Resource Strain: Not on file  Food Insecurity: Not on file  Transportation Needs: Not on file  Physical Activity: Not on file  Stress: Not on file  Social Connections: Not on file  Intimate Partner Violence: Not on file   Health Maintenance  Topic Date Due  . COVID-19 Vaccine (3 - Booster for Pfizer series) 07/25/2020  . MAMMOGRAM  03/06/2021  . COLONOSCOPY (Pts 45-78yrs Insurance coverage will need to be confirmed)  06/27/2025  . TETANUS/TDAP  06/04/2026  . INFLUENZA VACCINE  Completed  . DEXA SCAN  Completed  . Hepatitis C Screening  Completed  . PNA vac Low Risk Adult  Completed    The following portions of the patient's history were reviewed and updated as appropriate: allergies, current medications, past family history, past medical history, past social history, past surgical history and problem list.  Review of Systems Pertinent items noted in HPI and  remainder of comprehensive ROS otherwise negative.   Objective:    General appearance: alert and cooperative Head: Normocephalic, without obvious abnormality, atraumatic Eyes: conjunctivae/corneas clear. PERRL, EOM's intact. Fundi benign. Ears: normal TM's and external ear canals both ears Lungs: clear to auscultation bilaterally Heart: regular rate and rhythm, S1, S2 normal, no murmur, click, rub or gallop Abdomen: soft, non-tender; bowel sounds normal; no masses,  no organomegaly Extremities: extremities normal, atraumatic, no cyanosis or edema Skin: Skin color, texture, turgor normal. No rashes or lesions Lymph nodes: Cervical, supraclavicular, and axillary nodes normal. Neurologic: Alert and oriented X 3, normal strength and tone. Normal symmetric reflexes. Normal coordination and gait    Assessment:    Healthy female exam.      Plan:   She is up to date with her colon cancer, PAP and mammogram screen. Steady weight loss over the past few months from 163 in April of 2021 to 123 in August of 2021. She weighed 128 in Nov and now down to 126. I recommended an increase in her ensure to two bottles daily and continue regular 3 quare meal.  Need Lung Ca screen given her extensive smoking hx. She had MRA for Thoracic Aortic Aneurysm in the past, but unclear if it focused on lung cancer assessment. MRA ordered for TAA - Jennifer Foster is aware of the order and will schedule  Low dose CT of the Lung ordered for cancer screen - Jennifer Billow to check with radiology if both test can be combined to one order. Continue smoking cessation process.  Other chronic conditions: COPD, Afib, CHF) remains stable. Labs checked.  She got her COVID-19 Booster shots today.  I discussed and escribed Shingrix to her pharmacy, she will schedule a shot appointment with her pharmacy.

## 2020-09-28 NOTE — Assessment & Plan Note (Signed)
Compliant with Eliquis. Lab checked today.

## 2020-09-28 NOTE — Assessment & Plan Note (Signed)
Follow-up MRA ordered today. Staff will schedule appointment for her.

## 2020-09-28 NOTE — Assessment & Plan Note (Signed)
Compliant with Crestor

## 2020-09-28 NOTE — Assessment & Plan Note (Signed)
Stable on current regimen   

## 2020-09-28 NOTE — Assessment & Plan Note (Signed)
Achieved abstinence.

## 2020-09-29 LAB — CBC
Hematocrit: 35.6 % (ref 34.0–46.6)
Hemoglobin: 12.2 g/dL (ref 11.1–15.9)
MCH: 29.3 pg (ref 26.6–33.0)
MCHC: 34.3 g/dL (ref 31.5–35.7)
MCV: 86 fL (ref 79–97)
Platelets: 146 10*3/uL — ABNORMAL LOW (ref 150–450)
RBC: 4.16 x10E6/uL (ref 3.77–5.28)
RDW: 13.3 % (ref 11.7–15.4)
WBC: 4.3 10*3/uL (ref 3.4–10.8)

## 2020-09-29 LAB — TSH: TSH: 0.755 u[IU]/mL (ref 0.450–4.500)

## 2020-09-29 LAB — LIPID PANEL
Chol/HDL Ratio: 1.6 ratio (ref 0.0–4.4)
Cholesterol, Total: 151 mg/dL (ref 100–199)
HDL: 94 mg/dL (ref >39)
LDL Chol Calc (NIH): 46 mg/dL (ref 0–99)
Triglycerides: 52 mg/dL (ref 0–149)
VLDL Cholesterol Cal: 11 mg/dL (ref 5–40)

## 2020-09-29 LAB — BASIC METABOLIC PANEL
BUN/Creatinine Ratio: 9 — ABNORMAL LOW (ref 12–28)
BUN: 21 mg/dL (ref 8–27)
CO2: 21 mmol/L (ref 20–29)
Calcium: 8.8 mg/dL (ref 8.7–10.3)
Chloride: 102 mmol/L (ref 96–106)
Creatinine, Ser: 2.46 mg/dL — ABNORMAL HIGH (ref 0.57–1.00)
GFR calc Af Amer: 22 mL/min/{1.73_m2} — ABNORMAL LOW (ref >59)
GFR calc non Af Amer: 19 mL/min/{1.73_m2} — ABNORMAL LOW (ref >59)
Glucose: 72 mg/dL (ref 65–99)
Potassium: 4 mmol/L (ref 3.5–5.2)
Sodium: 138 mmol/L (ref 134–144)

## 2020-09-29 NOTE — Patient Instructions (Signed)
Visit Information  Goals Addressed              This Visit's Progress   .  Heart Failure (pt-stated)        Timeframe:  Long-Range Goal Priority:  High Start Date:     01/11/20                        Expected End Date:  12/28/20                      Patient Goals/Self care Activities: . Takes Heart Failure Medications as prescribed . Verbalizes understanding of and follows CHF Action Plan . Adheres to low sodium diet . Patient will weigh daily and record values . Patient will Call 911 with any symptoms in the HF Red zone    .  Hypertention Goals         Timeframe:  Long-Range Goal Priority:  High Start Date:  07/11/20                           Expected End Date:    12/28/20    Patient Goal/Self Care Activities:  . Self administers medications as prescribed . Attends all scheduled provider appointments . Calls provider office for new concerns, questions, or BP outside discussed parameters . Checks BP and records as discussed . Follows a low sodium diet/DASH diet .  agree to work together to make changes . ask questions to understand       The patient verbalized understanding of instructions, educational materials, and care plan provided today and declined offer to receive copy of patient instructions, educational materials, and care plan.   Follow up Plan: Patient would like continued follow-up.  CCM RNCM will outreach the patient within the next 14 days. Patient will call office if needed prior to next encounter  Lazaro Arms RN, BSN, [facility] Care Management Coordinator [place] Phone: [phone] I Fax: [phone]

## 2020-09-29 NOTE — Chronic Care Management (AMB) (Signed)
Care Management    RN Visit Note  09/29/2020 Name: Jennifer Foster MRN: [id number] DOB: [birth year]  Subjective: Jennifer Foster is a 75 y.o. year old female who is a primary care patient of Jennifer Feil, MD. The care management team was consulted for assistance with disease management and care coordination needs.    Engaged with patient by telephone for follow up visit in response to provider referral for case management and/or care coordination services.   Consent to Services:   Jennifer Foster was given information about Care Management services today including:  1. Care Management services includes personalized support from designated clinical staff supervised by her physician, including individualized plan of care and coordination with other care providers 2. 24/7 contact phone numbers for assistance for urgent and routine care needs. 3. The patient may stop case management services at any time by phone call to the office staff.  Patient agreed to services and consent obtained.    Assessment: Patient is currently experiencing difficulty with weighing daily and cheking her BP... See Care Plan below for interventions and patient self-care actives. Follow up Plan: Patient would like continued follow-up.  CCM RNCM  will outrerach the patient within the next 14 days. Patient will call office if needed prior to next encounter : Review of patient past medical history, allergies, medications, health status, including review of consultants reports, laboratory and other test data, was performed as part of comprehensive evaluation and provision of chronic care management services.   SDOH (Social Determinants of Health) assessments and interventions performed:    Care Plan  Allergies  Allergen Reactions  . Ace Inhibitors Swelling and Other (See Comments)    Eyes swell  . Lisinopril Swelling and Other (See Comments)    Swollen tongue   . Tramadol Itching    Outpatient Encounter Medications  as of 09/28/2020  Medication Sig  . albuterol (VENTOLIN HFA) 108 (90 Base) MCG/ACT inhaler Inhale 2 puffs into the lungs every 6 (six) hours as needed for wheezing or shortness of breath. (Patient not taking: Reported on 09/28/2020)  . amLODipine (NORVASC) 10 MG tablet TAKE 1 TABLET(10 MG) BY MOUTH DAILY  . apixaban (ELIQUIS) 2.5 MG TABS tablet Take 1 tablet (2.5 mg total) by mouth 2 (two) times daily.  . Baclofen 5 MG TABS Take 1 tablet by mouth every 8 (eight) hours as needed. (Patient not taking: Reported on 09/28/2020)  . calcitRIOL (ROCALTROL) 0.25 MCG capsule Take 0.25 mcg by mouth daily.   . diclofenac Sodium (VOLTAREN) 1 % GEL Apply 4 g topically 4 (four) times daily. Apply to hip joint  . escitalopram (LEXAPRO) 10 MG tablet TAKE 1 TABLET(10 MG) BY MOUTH DAILY  . famotidine (PEPCID) 20 MG tablet Take 1 tablet (20 mg total) by mouth daily.  . furosemide (LASIX) 20 MG tablet Take 1 tablet (20 mg total) by mouth daily.  . hydrALAZINE (APRESOLINE) 25 MG tablet Take 1 tablet (25 mg total) by mouth 3 (three) times daily.  . isosorbide mononitrate (IMDUR) 30 MG 24 hr tablet TAKE 1 TABLET(30 MG) BY MOUTH DAILY  . mirtazapine (REMERON) 15 MG tablet Take 1 tablet (15 mg total) by mouth at bedtime.  . nitroGLYCERIN (NITROSTAT) 0.4 MG SL tablet Place 1 tablet (0.4 mg total) under the tongue every 5 (five) minutes as needed for chest pain. If do not resolve after 1 tablet, go to ED (Patient not taking: Reported on 09/28/2020)  . potassium chloride SA (KLOR-CON) 20 MEQ tablet TAKE HALF  TABLET BY MOUTH EVERY DAY  . rosuvastatin (CRESTOR) 20 MG tablet Take 1 tablet (20 mg total) by mouth at bedtime.  [name] Zoster Vaccine Adjuvanted [facility]) injection Administer second dose after 2 months after the initial dose   No facility-administered encounter medications on file as of 09/28/2020.    Patient Active Problem List   Diagnosis Date Noted  . Sciatica of right side without back pain 07/12/2020  . Acquired  thrombophilia ([place]) 01/24/2020  . Thigh pain 01/24/2020  . Hypokalemia 01/24/2020  . History of cocaine abuse ([place]) 10/03/2019  . Upper GI bleed 09/04/2019  . Chronic diastolic CHF (congestive heart failure) ([place]) 03/23/2018  . Aortic atherosclerosis ([place]) 01/01/2018  . CAD (coronary artery disease) 11/27/2017  . Atrial fibrillation ([place]) 10/13/2017  . Multinodular goiter 04/14/2017  . Adrenal adenoma 04/14/2017  . Thoracic aortic aneurysm without rupture ([place]) 04/03/2017  . Weight loss 01/30/2017  . Anxiety and depression 12/25/2015  . Chronic kidney disease, stage 3 ([place]) 06/27/2015  . Essential hypertension   . Gout 02/28/2014  . Lipoma 03/19/2011  . Hyperlipidemia 01/01/2011  . Chronic anemia 11/23/2009  . Tobacco abuse 09/03/2009    Conditions to be addressed/monitored: CHF and HTN  Care Plan : RN Case Manager  Updates made by Lazaro Arms, RN since 09/29/2020 12:00 AM  Problem: (Heart Failure)   Long-Range Goal: Symptom Exacerbation Prevented or Minimized   Start Date: 01/11/2020  Expected End Date: 12/28/2020  Priority: High   Current Barriers:  [name] Knowledge deficit related to basic heart failure pathophysiology and self care management  Case Manager Clinical Goal(s):  [name] Over the next 90 days, patient will weigh self daily and record  Interventions:  . Basic overview and discussion of  Heart Failure reviewed . Provided written education on low sodium diet . Provided verbal education on low sodium diet . Reviewed Heart Failure Action Plan in depth and provided written copy . Provided education about placing scale on hard, flat surface . Advised patient to weigh each morning after emptying bladder . Discussed importance of daily weight and advised patient to weigh and record daily . medication-adherence assessment completed . self-awareness of signs/symptoms of worsening disease encouraged . Patient is not weighing she reports that something is wrong with her scales.  She plans to bring the scale to the office for me to help her.  She was seen in the office to day and her weight was 126 lbs. . She denies CP shortness of breath and swelling . Patient reports that she is working on quitting smoking.   .   Patient Goals/Self Care Activities:  . Takes Heart Failure Medications as prescribed . Verbalizes understanding of and follows CHF Action Plan . Adheres to low sodium diet    Care Plan : RN Case Manager  Updates made by Lazaro Arms, RN since 09/29/2020 12:00 AM  Problem: Hypertension (Hypertension)   Long-Range Goal: Hypertension Monitored   Start Date: 07/11/2020  Expected End Date: 12/28/2020  This Visit's Progress: Not on track  Priority: High  Note:   Current Barriers:  [name] Knowledge Deficits related to basic understanding of hypertension pathophysiology and self care management  Nurse Case Manager Clinical Goal(s):  [name] Over the next 30 days, patient will verbalize understanding of plan for hypertension management . Over the next 90 days, patient will demonstrate improved adherence to prescribed treatment plan for hypertension as evidenced by taking all medications as prescribed, monitoring and recording blood pressure as directed, adhering to low sodium/DASH diet  Interventions:  . Evaluation of current treatment plan related to hypertension self management and patient's adherence to plan as established by provider. . Provided education to patient re: stroke prevention, s/s of heart attack and stroke, DASH diet, complications of uncontrolled blood pressure . Reviewed medications with patient and discussed importance of compliance . Discussed plans with patient for ongoing care management follow up and provided patient with direct contact information for care management team . Advised patient, providing education and rationale, to monitor blood pressure daily and record, calling PCP for findings outside established parameters.  . medication list  reviewed . understanding of current medications assessed . Patient reports that she is not checking her BP.  She states that she for get to check.  RNCM advised of of the importance of checking her BP.  Patient agrees to check her BPat least 3 times /week and record the values .   Patient Goal/Self Care Activities:  . Self administers medications as prescribed . Attends all scheduled provider appointments . Calls provider office for new concerns, questions, or BP outside discussed parameters . Checks BP and records as discussed . Follows a low sodium diet/DASH diet . Check  BP 3 times / week and record values       Lazaro Arms RN, BSN, [place] Phone: [phone] I Fax: [phone]

## 2020-09-30 ENCOUNTER — Other Ambulatory Visit: Payer: Self-pay | Admitting: Physician Assistant

## 2020-10-01 ENCOUNTER — Telehealth: Payer: Self-pay | Admitting: Family Medicine

## 2020-10-01 NOTE — Telephone Encounter (Signed)
Unable to reach the patient and was unable to leave a message.  Significant findings:  Platelet is slightly low. ?? Related to use of Eliquis.  Monitor closely.

## 2020-10-02 ENCOUNTER — Telehealth: Payer: Self-pay | Admitting: *Deleted

## 2020-10-02 NOTE — Addendum Note (Signed)
Addended by: Andrena Mews T on: 10/02/2020 03:46 PM   Modules accepted: Orders

## 2020-10-02 NOTE — Progress Notes (Signed)
Patient ID: Jennifer Foster, female   DOB: [birth year], 75 y.o.   MRN: [id number]  I called and discussed imaging option with the radiologist Dr. Pascal Lux. He suggested that her Thoracic Aneurysm is relatively small and we can hold off on getting an MRA chest but instead, just order the CT of her lungs for cancer screen to include TAA assessment. If this is non-useful, then we can get the MRA of her chest later.  Order has been adjusted.

## 2020-10-02 NOTE — Telephone Encounter (Signed)
Attempted to prior approve pts CT scan.   Her [employer] expired as of 10/01/20.  I am not sure what insurance she has now.   Attempted to call patient, no answer and no machine.  Will await callback to gain info on insurance. Christen Bame, CMA

## 2020-10-12 ENCOUNTER — Ambulatory Visit: Payer: Medicare HMO

## 2020-10-13 NOTE — Chronic Care Management (AMB) (Signed)
Care Management    RN Visit Note  10/13/2020 Name: Jennifer Foster MRN: [id number] DOB: May 29, 1946  Subjective: Jennifer Foster is a 75 y.o. year old female who is a primary care patient of Jennifer Feil, MD. The care management team was consulted for assistance with disease management and care coordination needs.    Engaged with patient by telephone for follow up visit in response to provider referral for case management and/or care coordination services.   Consent to Services:   Jennifer Foster was given information about Care Management services today including:  1. Care Management services includes personalized support from designated clinical staff supervised by her physician, including individualized plan of care and coordination with other care providers 2. 24/7 contact phone numbers for assistance for urgent and routine care needs. 3. The patient may stop case management services at any time by phone call to the office staff.  Patient agreed to services and consent obtained.    Assessment: Patient continues to experience difficulty with checking her BP and weight... See Care Plan below for interventions and patient self-care actives. Follow up Plan: Patient would like continued follow-up.  CCM RNCM will outreach the patient within the next 21 days.. Patient will call office if needed prior to next encounter  performed as part of comprehensive evaluation and provision of chronic care management services.   SDOH (Social Determinants of Health) assessments and interventions performed:    Care Plan  Allergies  Allergen Reactions  . Ace Inhibitors Swelling and Other (See Comments)    Eyes swell  . Lisinopril Swelling and Other (See Comments)    Swollen tongue   . Tramadol Itching    Outpatient Encounter Medications as of 10/12/2020  Medication Sig  . albuterol (VENTOLIN HFA) 108 (90 Base) MCG/ACT inhaler Inhale 2 puffs into the lungs every 6 (six) hours as needed for wheezing or  shortness of breath. (Patient not taking: Reported on 09/28/2020)  . amLODipine (NORVASC) 10 MG tablet TAKE 1 TABLET(10 MG) BY MOUTH DAILY  . apixaban (ELIQUIS) 2.5 MG TABS tablet Take 1 tablet (2.5 mg total) by mouth 2 (two) times daily.  . Baclofen 5 MG TABS Take 1 tablet by mouth every 8 (eight) hours as needed. (Patient not taking: Reported on 09/28/2020)  . calcitRIOL (ROCALTROL) 0.25 MCG capsule Take 0.25 mcg by mouth daily.   . diclofenac Sodium (VOLTAREN) 1 % GEL Apply 4 g topically 4 (four) times daily. Apply to hip joint  . escitalopram (LEXAPRO) 10 MG tablet TAKE 1 TABLET(10 MG) BY MOUTH DAILY  . famotidine (PEPCID) 20 MG tablet Take 1 tablet (20 mg total) by mouth daily.  . furosemide (LASIX) 20 MG tablet Take 1 tablet (20 mg total) by mouth daily.  . hydrALAZINE (APRESOLINE) 25 MG tablet Take 1 tablet (25 mg total) by mouth 3 (three) times daily.  . isosorbide mononitrate (IMDUR) 30 MG 24 hr tablet TAKE 1 TABLET(30 MG) BY MOUTH DAILY  . mirtazapine (REMERON) 15 MG tablet Take 1 tablet (15 mg total) by mouth at bedtime.  . nitroGLYCERIN (NITROSTAT) 0.4 MG SL tablet Place 1 tablet (0.4 mg total) under the tongue every 5 (five) minutes as needed for chest pain. If do not resolve after 1 tablet, go to ED (Patient not taking: Reported on 09/28/2020)  . potassium chloride SA (KLOR-CON) 20 MEQ tablet TAKE HALF TABLET BY MOUTH EVERY DAY  . rosuvastatin (CRESTOR) 20 MG tablet Take 1 tablet (20 mg total) by mouth at bedtime.  [name]  Zoster Vaccine Adjuvanted [facility]) injection Administer second dose after 2 months after the initial dose   No facility-administered encounter medications on file as of 10/12/2020.    Patient Active Problem List   Diagnosis Date Noted  . Sciatica of right side without back pain 07/12/2020  . Acquired thrombophilia ([place]) 01/24/2020  . Thigh pain 01/24/2020  . Hypokalemia 01/24/2020  . History of cocaine abuse ([place]) 10/03/2019  . Upper GI bleed 09/04/2019  . Chronic  diastolic CHF (congestive heart failure) ([place]) 03/23/2018  . Aortic atherosclerosis ([place]) 01/01/2018  . CAD (coronary artery disease) 11/27/2017  . Atrial fibrillation ([place]) 10/13/2017  . Multinodular goiter 04/14/2017  . Adrenal adenoma 04/14/2017  . Thoracic aortic aneurysm without rupture ([place]) 04/03/2017  . Weight loss 01/30/2017  . Anxiety and depression 12/25/2015  . Chronic kidney disease, stage 3 ([place]) 06/27/2015  . Essential hypertension   . Gout 02/28/2014  . Lipoma 03/19/2011  . Hyperlipidemia 01/01/2011  . Chronic anemia 11/23/2009  . Tobacco abuse 09/03/2009    Conditions to be addressed/monitored: CHF and HTN  Care Plan : RN Case Manager  Updates made by Lazaro Arms, RN since 10/13/2020 12:00 AM    Problem: (Heart Failure)     Long-Range Goal: Symptom Exacerbation Prevented or Minimized   Start Date: 01/11/2020  Expected End Date: 01/29/2021  Priority: High  Note:    Current Barriers:  [name] Knowledge deficit related to basic heart failure pathophysiology and self care management  Case Manager Clinical Goal(s):  [name] Over the next 90 days, patient will weigh self daily and record  Interventions:  . Basic overview and discussion of  Heart Failure reviewed . Provided written education on low sodium diet . Provided verbal education on low sodium diet . Reviewed Heart Failure Action Plan in depth and provided written copy . Provided education about placing scale on hard, flat surface . Advised patient to weigh each morning after emptying bladder . Discussed importance of daily weight and advised patient to weigh and record daily . medication-adherence assessment completed-she reports that she is taking her medications as prescribed . self-awareness of signs/symptoms of worsening disease encouraged . Patient is not weighing she reports that something is wrong with her scales. She plans to bring the scale to the office. . She denies CP shortness of breath and  swelling . Patient reports that she is working on quitting smoking.     Patient Goals/Self Care Activities:  . Takes Heart Failure Medications as prescribed . Verbalizes understanding of and follows CHF Action Plan . Adheres to low sodium diet    Care Plan : RN Case Manager  Updates made by Lazaro Arms, RN since 10/13/2020 12:00 AM  Problem: Hypertension (Hypertension)   Long-Range Goal: Hypertension Monitored   Start Date: 07/11/2020  Expected End Date: 01/29/2021  Recent Progress: Not on track  Priority: High  Current Barriers:  [name] Knowledge Deficits related to basic understanding of hypertension pathophysiology and self care management  Nurse Case Manager Clinical Goal(s):  [name] Over the next 30 days, patient will verbalize understanding of plan for hypertension management . Over the next 90 days, patient will demonstrate improved adherence to prescribed treatment plan for hypertension as evidenced by taking all medications as prescribed, monitoring and recording blood pressure as directed, adhering to low sodium/DASH diet  Interventions:  . Evaluation of current treatment plan related to hypertension self management and patient's adherence to plan as established by provider. . Provided education to patient re: stroke prevention, s/s of  heart attack and stroke, DASH diet, complications of uncontrolled blood pressure . Reviewed medications with patient and discussed importance of compliance . Discussed plans with patient for ongoing care management follow up and provided patient with direct contact information for care management team . Advised patient, providing education and rationale, to monitor blood pressure daily and record, calling PCP for findings outside established parameters.  . medication list reviewed . understanding of current medications assessed . Patient reports that she is not checking her BP.  She states that she forgets to check.  RNCM advised of of the importance of  checking her BP.  Patient agrees to check her BP at least 3 times /week and record the values.  She did not check this week because she states she has a lot going on but she is going to start.   Patient Goal/Self Care Activities:  . Self administers medications as prescribed . Attends all scheduled provider appointments . Calls provider office for new concerns, questions, or BP outside discussed parameters . Checks BP and records as discussed . Follows a low sodium diet/DASH diet . Check  BP 3 times / week and record values      Lazaro Arms RN, BSN, [place] Phone: [phone] I Fax: [phone]

## 2020-10-13 NOTE — Patient Instructions (Signed)
Visit Information  Jennifer Foster  it was nice speaking with you. Please call me directly [phone] if you have questions about the goals we discussed.  Goals Addressed              This Visit's Progress   .  Heart Failure (pt-stated)   Not on track     Timeframe:  Long-Range Goal Priority:  High Start Date:     01/11/20                        Expected End Date:  01/29/21                    Patient Goals/Self care Activities: . Takes Heart Failure Medications as prescribed . Verbalizes understanding of and follows CHF Action Plan . Adheres to low sodium diet . Patient will weigh daily and record values . Patient will Call 911 with any symptoms in the HF Red zone    .  Hypertention Goals   Not on track      Timeframe:  Long-Range Goal Priority:  High Start Date:  07/11/20                           Expected End Date: 01/29/21      Patient Goal/Self Care Activities:  . Self administers medications as prescribed . Attends all scheduled provider appointments . Calls provider office for new concerns, questions, or BP outside discussed parameters . Checks BP and records as discussed . Follows a low sodium diet/DASH diet .  agree to work together to make changes . ask questions to understand       The patient verbalized understanding of instructions, educational materials, and care plan provided today and declined offer to receive copy of patient instructions, educational materials, and care plan.   Follow up Plan: Patient would like continued follow-up.  CCM RNCM will outreach the patient within the next 21 days.. Patient will call office if needed prior to next encounter  Lazaro Arms, RN

## 2020-10-22 ENCOUNTER — Telehealth: Payer: Self-pay

## 2020-10-22 NOTE — Telephone Encounter (Signed)
5th attempt pt did pick up the phone. Was able to inform pt of her appt on 3/3 12:45 at [place] wrote down the appt time. She also wanted to know if Dr. [name] could give her something to help with her memory. She says that it seems that she is having a hard time remembering  things. Salvatore Marvel, CMA

## 2020-10-22 NOTE — Telephone Encounter (Signed)
Attempted to call pt 3 times. Someone would pick up I would say hello speak my name and the office and they would say nothing and just hang up. I was calling to inform of CT scan at [facility] on 3/3 at 12:45. Salvatore Marvel, CMA

## 2020-10-22 NOTE — Telephone Encounter (Signed)
-----   Message from [name], MD sent at 10/21/2020 11:37 AM EST ----- Please contact this patient to schedule her CT lungs. I don't see that an appointment has been made for her. Thanks.

## 2020-10-22 NOTE — Progress Notes (Signed)
Made appt for 3/3 12:45pm at [facility] . Pt informed. Salvatore Marvel, CMA

## 2020-10-23 ENCOUNTER — Encounter (HOSPITAL_COMMUNITY): Payer: Self-pay | Admitting: Emergency Medicine

## 2020-10-23 ENCOUNTER — Emergency Department (HOSPITAL_COMMUNITY)
Admission: EM | Admit: 2020-10-23 | Discharge: 2020-10-23 | Disposition: A | Discharge status: Home / Self Care | Payer: Medicare HMO | Source: Home / Self Care

## 2020-10-23 ENCOUNTER — Other Ambulatory Visit: Payer: Self-pay

## 2020-10-23 DIAGNOSIS — Z5321 Procedure and treatment not carried out due to patient leaving prior to being seen by health care provider: Secondary | ICD-10-CM | POA: Insufficient documentation

## 2020-10-23 DIAGNOSIS — D735 Infarction of spleen: Secondary | ICD-10-CM | POA: Diagnosis not present

## 2020-10-23 DIAGNOSIS — R1010 Upper abdominal pain, unspecified: Secondary | ICD-10-CM | POA: Insufficient documentation

## 2020-10-23 LAB — URINALYSIS, ROUTINE W REFLEX MICROSCOPIC
Bilirubin Urine: NEGATIVE
Glucose, UA: NEGATIVE mg/dL
Hgb urine dipstick: NEGATIVE
Ketones, ur: NEGATIVE mg/dL
Leukocytes,Ua: NEGATIVE
Nitrite: NEGATIVE
Protein, ur: >=300 mg/dL — AB
Specific Gravity, Urine: 1.012 (ref 1.005–1.030)
pH: 6 (ref 5.0–8.0)

## 2020-10-23 LAB — COMPREHENSIVE METABOLIC PANEL
ALT: 12 U/L (ref 0–44)
AST: 21 U/L (ref 15–41)
Albumin: 2.9 g/dL — ABNORMAL LOW (ref 3.5–5.0)
Alkaline Phosphatase: 54 U/L (ref 38–126)
Anion gap: 10 (ref 5–15)
BUN: 27 mg/dL — ABNORMAL HIGH (ref 8–23)
CO2: 23 mmol/L (ref 22–32)
Calcium: 9 mg/dL (ref 8.9–10.3)
Chloride: 103 mmol/L (ref 98–111)
Creatinine, Ser: 3.31 mg/dL — ABNORMAL HIGH (ref 0.44–1.00)
GFR, Estimated: 14 mL/min — ABNORMAL LOW (ref >60)
Glucose, Bld: 133 mg/dL — ABNORMAL HIGH (ref 70–99)
Potassium: 4.2 mmol/L (ref 3.5–5.1)
Sodium: 136 mmol/L (ref 135–145)
Total Bilirubin: 0.8 mg/dL (ref 0.3–1.2)
Total Protein: 5.8 g/dL — ABNORMAL LOW (ref 6.5–8.1)

## 2020-10-23 LAB — LIPASE, BLOOD: Lipase: 27 U/L (ref 11–51)

## 2020-10-23 LAB — CBC
HCT: 30.1 % — ABNORMAL LOW (ref 36.0–46.0)
Hemoglobin: 10.2 g/dL — ABNORMAL LOW (ref 12.0–15.0)
MCH: 29.6 pg (ref 26.0–34.0)
MCHC: 33.9 g/dL (ref 30.0–36.0)
MCV: 87.2 fL (ref 80.0–100.0)
Platelets: 155 10*3/uL (ref 150–400)
RBC: 3.45 MIL/uL — ABNORMAL LOW (ref 3.87–5.11)
RDW: 13.8 % (ref 11.5–15.5)
WBC: 5.8 10*3/uL (ref 4.0–10.5)
nRBC: 0 % (ref 0.0–0.2)

## 2020-10-23 NOTE — ED Triage Notes (Addendum)
Pt to triage via [facility] from home.  Reports upper abd pain since noon.  Pt normally has a daily BM and states it was hard yesterday.  Took mag citrate today with a small amount of stool.

## 2020-10-23 NOTE — ED Notes (Signed)
Patient called x2 with no response and not visible in lobby °

## 2020-10-23 NOTE — ED Notes (Signed)
Patient called for vitals recheck x1 with no response

## 2020-10-24 ENCOUNTER — Telehealth: Payer: Self-pay

## 2020-10-24 NOTE — Telephone Encounter (Signed)
Spoke with pt made appt in ATC tomorrow at 9:50 for stomach pains Salvatore Marvel, CMA

## 2020-10-24 NOTE — Telephone Encounter (Signed)
Please advised patient that I have been trying to reach them since yesterday. I have called three times already today with no response. Please have them call and connect to me on my office line anytime this afternoon.  Also, get her on ATC for tomorrow or earliest available

## 2020-10-24 NOTE — Telephone Encounter (Signed)
Pt husband's walked in requested that Dr [name] call his wife, she went to the hospital last night but was never seen.

## 2020-10-24 NOTE — Telephone Encounter (Signed)
Routed to PCP. Shamell Suarez, CMA  

## 2020-10-25 ENCOUNTER — Emergency Department (HOSPITAL_COMMUNITY): Payer: Medicare HMO

## 2020-10-25 ENCOUNTER — Encounter (HOSPITAL_COMMUNITY): Payer: Self-pay | Admitting: Emergency Medicine

## 2020-10-25 ENCOUNTER — Inpatient Hospital Stay (HOSPITAL_COMMUNITY)
Admission: EM | Admit: 2020-10-25 | Discharge: 2020-10-27 | DRG: 815 | Disposition: A | Discharge status: Home / Self Care | Payer: Medicare HMO | Attending: Family Medicine | Admitting: Family Medicine

## 2020-10-25 ENCOUNTER — Other Ambulatory Visit: Payer: Self-pay

## 2020-10-25 ENCOUNTER — Ambulatory Visit (INDEPENDENT_AMBULATORY_CARE_PROVIDER_SITE_OTHER): Payer: Medicare HMO | Admitting: Family Medicine

## 2020-10-25 VITALS — BP 178/102 | HR 80 | Ht 64.0 in

## 2020-10-25 DIAGNOSIS — I4891 Unspecified atrial fibrillation: Secondary | ICD-10-CM | POA: Diagnosis present

## 2020-10-25 DIAGNOSIS — M109 Gout, unspecified: Secondary | ICD-10-CM | POA: Diagnosis present

## 2020-10-25 DIAGNOSIS — K219 Gastro-esophageal reflux disease without esophagitis: Secondary | ICD-10-CM | POA: Diagnosis present

## 2020-10-25 DIAGNOSIS — I361 Nonrheumatic tricuspid (valve) insufficiency: Secondary | ICD-10-CM | POA: Diagnosis not present

## 2020-10-25 DIAGNOSIS — D735 Infarction of spleen: Principal | ICD-10-CM | POA: Diagnosis present

## 2020-10-25 DIAGNOSIS — Z7901 Long term (current) use of anticoagulants: Secondary | ICD-10-CM | POA: Diagnosis not present

## 2020-10-25 DIAGNOSIS — E785 Hyperlipidemia, unspecified: Secondary | ICD-10-CM | POA: Diagnosis present

## 2020-10-25 DIAGNOSIS — I700 Atherosclerosis of aorta: Secondary | ICD-10-CM | POA: Diagnosis present

## 2020-10-25 DIAGNOSIS — E042 Nontoxic multinodular goiter: Secondary | ICD-10-CM | POA: Diagnosis present

## 2020-10-25 DIAGNOSIS — I160 Hypertensive urgency: Secondary | ICD-10-CM | POA: Diagnosis present

## 2020-10-25 DIAGNOSIS — Z79899 Other long term (current) drug therapy: Secondary | ICD-10-CM | POA: Diagnosis not present

## 2020-10-25 DIAGNOSIS — I5032 Chronic diastolic (congestive) heart failure: Secondary | ICD-10-CM | POA: Diagnosis present

## 2020-10-25 DIAGNOSIS — Z87891 Personal history of nicotine dependence: Secondary | ICD-10-CM

## 2020-10-25 DIAGNOSIS — Z888 Allergy status to other drugs, medicaments and biological substances status: Secondary | ICD-10-CM

## 2020-10-25 DIAGNOSIS — Z20822 Contact with and (suspected) exposure to covid-19: Secondary | ICD-10-CM | POA: Diagnosis present

## 2020-10-25 DIAGNOSIS — N179 Acute kidney failure, unspecified: Secondary | ICD-10-CM | POA: Diagnosis present

## 2020-10-25 DIAGNOSIS — I130 Hypertensive heart and chronic kidney disease with heart failure and stage 1 through stage 4 chronic kidney disease, or unspecified chronic kidney disease: Secondary | ICD-10-CM | POA: Diagnosis present

## 2020-10-25 DIAGNOSIS — I2510 Atherosclerotic heart disease of native coronary artery without angina pectoris: Secondary | ICD-10-CM | POA: Diagnosis present

## 2020-10-25 DIAGNOSIS — R109 Unspecified abdominal pain: Secondary | ICD-10-CM | POA: Insufficient documentation

## 2020-10-25 DIAGNOSIS — I712 Thoracic aortic aneurysm, without rupture: Secondary | ICD-10-CM | POA: Diagnosis present

## 2020-10-25 DIAGNOSIS — R1010 Upper abdominal pain, unspecified: Secondary | ICD-10-CM | POA: Diagnosis present

## 2020-10-25 DIAGNOSIS — Z841 Family history of disorders of kidney and ureter: Secondary | ICD-10-CM | POA: Diagnosis not present

## 2020-10-25 DIAGNOSIS — N184 Chronic kidney disease, stage 4 (severe): Secondary | ICD-10-CM | POA: Diagnosis present

## 2020-10-25 HISTORY — DX: Hypertensive urgency: I16.0

## 2020-10-25 LAB — COMPREHENSIVE METABOLIC PANEL
ALT: 11 U/L (ref 0–44)
AST: 21 U/L (ref 15–41)
Albumin: 3.1 g/dL — ABNORMAL LOW (ref 3.5–5.0)
Alkaline Phosphatase: 64 U/L (ref 38–126)
Anion gap: 11 (ref 5–15)
BUN: 23 mg/dL (ref 8–23)
CO2: 23 mmol/L (ref 22–32)
Calcium: 9.8 mg/dL (ref 8.9–10.3)
Chloride: 104 mmol/L (ref 98–111)
Creatinine, Ser: 3.04 mg/dL — ABNORMAL HIGH (ref 0.44–1.00)
GFR, Estimated: 16 mL/min — ABNORMAL LOW (ref >60)
Glucose, Bld: 109 mg/dL — ABNORMAL HIGH (ref 70–99)
Potassium: 4 mmol/L (ref 3.5–5.1)
Sodium: 138 mmol/L (ref 135–145)
Total Bilirubin: 0.9 mg/dL (ref 0.3–1.2)
Total Protein: 6.9 g/dL (ref 6.5–8.1)

## 2020-10-25 LAB — CBC WITH DIFFERENTIAL/PLATELET
Abs Immature Granulocytes: 0.04 10*3/uL (ref 0.00–0.07)
Basophils Absolute: 0 10*3/uL (ref 0.0–0.1)
Basophils Relative: 0 %
Eosinophils Absolute: 0 10*3/uL (ref 0.0–0.5)
Eosinophils Relative: 0 %
HCT: 35.4 % — ABNORMAL LOW (ref 36.0–46.0)
Hemoglobin: 12.7 g/dL (ref 12.0–15.0)
Immature Granulocytes: 0 %
Lymphocytes Relative: 5 %
Lymphs Abs: 0.5 10*3/uL — ABNORMAL LOW (ref 0.7–4.0)
MCH: 30.6 pg (ref 26.0–34.0)
MCHC: 35.9 g/dL (ref 30.0–36.0)
MCV: 85.3 fL (ref 80.0–100.0)
Monocytes Absolute: 0.9 10*3/uL (ref 0.1–1.0)
Monocytes Relative: 9 %
Neutro Abs: 8.7 10*3/uL — ABNORMAL HIGH (ref 1.7–7.7)
Neutrophils Relative %: 86 %
Platelets: 160 10*3/uL (ref 150–400)
RBC: 4.15 MIL/uL (ref 3.87–5.11)
RDW: 13.4 % (ref 11.5–15.5)
WBC: 10.2 10*3/uL (ref 4.0–10.5)
nRBC: 0 % (ref 0.0–0.2)

## 2020-10-25 LAB — TROPONIN I (HIGH SENSITIVITY)
Troponin I (High Sensitivity): 53 ng/L — ABNORMAL HIGH (ref <18)
Troponin I (High Sensitivity): 48 ng/L — ABNORMAL HIGH (ref <18)

## 2020-10-25 LAB — SARS CORONAVIRUS 2 (TAT 6-24 HRS): SARS Coronavirus 2: NEGATIVE

## 2020-10-25 LAB — PROTIME-INR
INR: 1.1 (ref 0.8–1.2)
Prothrombin Time: 13.7 seconds (ref 11.4–15.2)

## 2020-10-25 LAB — LIPASE, BLOOD: Lipase: 23 U/L (ref 11–51)

## 2020-10-25 MED ORDER — MORPHINE SULFATE (PF) 4 MG/ML IV SOLN
4.0000 mg | Freq: Once | INTRAVENOUS | Status: AC
  Administered 2020-10-25: 4 mg via INTRAVENOUS
  Filled 2020-10-25: qty 1

## 2020-10-25 MED ORDER — MIRTAZAPINE 15 MG PO TABS
15.0000 mg | ORAL_TABLET | Freq: Every day | ORAL | Status: DC
  Administered 2020-10-25 – 2020-10-26 (×2): 15 mg via ORAL
  Filled 2020-10-25 (×2): qty 1

## 2020-10-25 MED ORDER — HEPARIN (PORCINE) 25000 UT/250ML-% IV SOLN
1150.0000 [IU]/h | INTRAVENOUS | Status: AC
  Administered 2020-10-25: 19:00:00 900 [IU]/h via INTRAVENOUS
  Filled 2020-10-25: qty 250

## 2020-10-25 MED ORDER — ALBUTEROL SULFATE HFA 108 (90 BASE) MCG/ACT IN AERS
2.0000 | INHALATION_SPRAY | Freq: Four times a day (QID) | RESPIRATORY_TRACT | Status: DC | PRN
  Filled 2020-10-25: qty 6.7

## 2020-10-25 MED ORDER — FAMOTIDINE 20 MG PO TABS
20.0000 mg | ORAL_TABLET | Freq: Every day | ORAL | Status: DC
  Administered 2020-10-26: 20 mg via ORAL
  Filled 2020-10-25: qty 1

## 2020-10-25 MED ORDER — ESCITALOPRAM OXALATE 10 MG PO TABS: 10.0000 mg | ORAL_TABLET | Freq: Every day | ORAL | Status: DC

## 2020-10-25 MED ORDER — AMLODIPINE BESYLATE 5 MG PO TABS
10.0000 mg | ORAL_TABLET | Freq: Once | ORAL | Status: AC
  Administered 2020-10-25: 10 mg via ORAL
  Filled 2020-10-25: qty 2

## 2020-10-25 MED ORDER — HYDRALAZINE HCL 25 MG PO TABS
25.0000 mg | ORAL_TABLET | Freq: Three times a day (TID) | ORAL | Status: DC
  Administered 2020-10-25 – 2020-10-27 (×5): 25 mg via ORAL
  Filled 2020-10-25 (×5): qty 1

## 2020-10-25 MED ORDER — HYDRALAZINE HCL 20 MG/ML IJ SOLN
10.0000 mg | Freq: Once | INTRAMUSCULAR | Status: AC
  Administered 2020-10-25: 10 mg via INTRAVENOUS
  Filled 2020-10-25: qty 1

## 2020-10-25 MED ORDER — HYDRALAZINE HCL 25 MG PO TABS: 25.0000 mg | ORAL_TABLET | Freq: Three times a day (TID) | ORAL | Status: DC

## 2020-10-25 MED ORDER — ONDANSETRON HCL 4 MG/2ML IJ SOLN
4.0000 mg | Freq: Once | INTRAMUSCULAR | Status: AC
  Administered 2020-10-25: 4 mg via INTRAVENOUS
  Filled 2020-10-25: qty 2

## 2020-10-25 MED ORDER — CALCITRIOL 0.25 MCG PO CAPS
0.2500 ug | ORAL_CAPSULE | Freq: Every day | ORAL | Status: DC
  Administered 2020-10-25 – 2020-10-27 (×3): 0.25 ug via ORAL
  Filled 2020-10-25 (×4): qty 1

## 2020-10-25 MED ORDER — PANTOPRAZOLE SODIUM 40 MG IV SOLR
40.0000 mg | Freq: Once | INTRAVENOUS | Status: AC
  Administered 2020-10-25: 40 mg via INTRAVENOUS
  Filled 2020-10-25: qty 40

## 2020-10-25 NOTE — ED Notes (Signed)
Pt has finished her second contrast and is ready for transport to CT

## 2020-10-25 NOTE — H&P (Cosign Needed Addendum)
[facility] Admission History and Physical Service Pager: [phone]  Patient name: Jennifer Foster Medical record number: [id number] Date of birth: [birth year] Age: 75 y.o. Gender: female  Primary Care Provider: [name], MD Consultants: none Code Status: Full  Preferred Emergency Contact: Jennifer Foster (sister) [phone]  Chief Complaint: abdominal pain  Assessment and Plan: Jennifer Foster is a 75 y.o. female presenting with generalized abdominal pain . PMH is significant for HTN, atrial fibrillation, CAD, history of upper GI bleed with peptic ulcer disease, anxiety, depression and hyperlipidemia.  Abdominal Pain Patient presents with abdominal pain that has persisted for 2 days. Seen by [facility] and noted to have significant amount of abdominal pain in the clinic and recommended to have further evaluation in the ED.  CXR notable for cardiomegaly with lungs clear. CT chest and  abdomen/pelvis noted to small left pleural effusion with minimal left lung base atelactasis with several wedge-shaped splenic hypodensities most consistent with areas of infarct, cholelithiasis, severe distal colonic diverticulosis without evidence of bowel obstruction. In the ED, patient taken off eliquis and placed on heparin. Given morphine 4 mg, for pain. S/p protonix and zofran. On exam, patient noted to have LUQ tenderness on deep palpation. Abdominal pain likely secondary to splenic infarct. Less likely pancreatitis given lipase wnl. No concern for bowel obstruction given that imaging does not demonstrate this. Possible concern for diverticulosis as well.  -Admit to FPTS, attending Dr. Ardelia Mems -heparin per pharmacy  -pepcid 20 mg daily -am CBC and CMP -monitor clinically for complications such as hemorrhage and possible emergence of infection  -no NSAIDs -monitor for worsening pain, fevers -consider hematology consult  Hypertensive urgency BP on admission 202/87. Given IV  hydralazine 10 mg, BP most recently 167/74. History of HTN, home medications include amlodipine 10 mg and hydralazine 25 mg. -continue home meds as appropriate   AKI with CKD stage 4 Cr 3.04 with GFR 16 on admission, likely due to intrarenal causes given hypertensive state on admission. Baseline Cr~2.4 -am CMP -avoid nephrotoxic agents -ivf  Atrial Fibrillation  EKG on admission notable for atrial fibrillation. Home medications include eliquis.  -hold home eliquis -heparin per pharmacy   History of cocaine use Denies current use.  -UDS -avoid beta blockers except coreg if needed  FEN/GI: NPO at midnight Prophylaxis: heparin per pharmacy   Disposition: admit to inpatient, attending Dr. Ardelia Mems   History of Present Illness:  Jennifer Foster is a 75 y.o. female presenting with 2 day history of abdominal pain. Denies radiating pain. Rates pain 4/10 and states that the pain has persisted at this intensity. Endorses chills and having no appetite since the pain started. Tried to go to the ED for further evaluation 2 days ago but left due to the wait. Seen in the outpatient [facility] clinic where PCP recommended further ED evaluation. Denies nausea, vomiting, fever, dizziness, diarrhea, recent illness, known sick contacts, hematochezia, dyspnea and chest pain.   Review Of Systems: Per HPI with the following additions:  Review of Systems  Constitutional: Positive for appetite change and chills. Negative for fever.  Eyes: Negative for visual disturbance.  Respiratory: Negative for shortness of breath.   Cardiovascular: Negative for chest pain and leg swelling.  Gastrointestinal: Positive for abdominal pain. Negative for blood in stool, diarrhea, nausea and vomiting.  Neurological: Negative for weakness.     Patient Active Problem List   Diagnosis Date Noted  . Abdominal pain 10/25/2020  . Hypertensive urgency 10/25/2020  .  Sciatica of right side without back pain 07/12/2020  . Acquired  thrombophilia ([place]) 01/24/2020  . Thigh pain 01/24/2020  . Hypokalemia 01/24/2020  . History of cocaine abuse ([place]) 10/03/2019  . Upper GI bleed 09/04/2019  . Chronic diastolic CHF (congestive heart failure) ([place]) 03/23/2018  . Aortic atherosclerosis ([place]) 01/01/2018  . CAD (coronary artery disease) 11/27/2017  . Atrial fibrillation ([place]) 10/13/2017  . Multinodular goiter 04/14/2017  . Adrenal adenoma 04/14/2017  . Thoracic aortic aneurysm without rupture ([place]) 04/03/2017  . Weight loss 01/30/2017  . Anxiety and depression 12/25/2015  . Chronic kidney disease, stage 3 ([place]) 06/27/2015  . Essential hypertension   . Gout 02/28/2014  . Lipoma 03/19/2011  . Hyperlipidemia 01/01/2011  . Chronic anemia 11/23/2009  . Tobacco abuse 09/03/2009    Past Medical History: Past Medical History:  Diagnosis Date  . Acute on chronic blood loss anemia 06/26/2015  . Acute respiratory failure with hypoxia ([place]) 02/21/2018  . Aortic atherosclerosis ([place]) 01/01/2018   CT 04/2017  . Asthma   . Atrial fibrillation ([place]) 10/13/2017   Newly diagnosed in Jan 2019 // Apixaban for anticoag // Apixaban held in 2019 for anemia but resumed; Hgb stable since restarting  . CAD (coronary artery disease) 11/27/2017   Nuc stress 3/19: anterior ischemia, ?inf scar, EF 50, intermediate risk // LHC 4/19: LAD mild dz, pLCx 35, OM2 30, mRCA 60, dRCA 65, LVEDP 15-20  . Chronic diastolic CHF (congestive heart failure) ([place]) 03/23/2018   Echo 2/19: Moderate LVH, EF 01-75, grade 2 diastolic dysfunction, trivial MR, moderate to severe LAE, PASP 30//Echocardiogram 7/21: EF 55-60, severe LVH, severe LAE, mod RAE, mild MR, mild dilation of Asc aorta (40 mm), RVSP 27.6   . Chronic kidney disease (CKD), stage III (moderate) ([place]) 06/27/2015  . DIVERTICULAR BLEEDING, HX OF 10/14/2007   Colonoscopy 2008 showed diverticulosis.   [name] GERD (gastroesophageal reflux disease)   . Gout   . Hyperlipemia   . Hypertension   . Proteinuria  01/11/2016  . Thoracic aortic aneurysm 04/03/2017   CT 8/18: ascending thoracic aorta 4.1 cm // unable to do CTA due to CKD // Chest MRA 05/2019: Ascending thoracic aorta 40 mm    Past Surgical History: Past Surgical History:  Procedure Laterality Date  . ABDOMINAL HYSTERECTOMY  1981   partial, per pt history  . BIOPSY  09/05/2019   Procedure: BIOPSY;  Surgeon: Ronnette Juniper, MD;  Location: [facility];  Service: Gastroenterology;;  . COLONOSCOPY N/A 12/08/2013   Procedure: COLONOSCOPY;  Surgeon: [name], MD;  Location: [facility];  Service: Endoscopy;  Laterality: N/A;  . COLONOSCOPY N/A 06/28/2015   Procedure: COLONOSCOPY;  Surgeon: Clarene Essex, MD;  Location[facility] ENDOSCOPY;  Service: Endoscopy;  Laterality: N/A;  . ESOPHAGOGASTRODUODENOSCOPY N/A 12/08/2013   Procedure: ESOPHAGOGASTRODUODENOSCOPY (EGD);  Surgeon: [name], MD;  Location: [facility] ENDOSCOPY;  Service: Endoscopy;  Laterality: N/A;  . ESOPHAGOGASTRODUODENOSCOPY (EGD) WITH PROPOFOL N/A 09/05/2019   Procedure: ESOPHAGOGASTRODUODENOSCOPY (EGD) WITH PROPOFOL;  Surgeon: Ronnette Juniper, MD;  Location: [facility] ENDOSCOPY;  Service: Gastroenterology;  Laterality: N/A;  . GIVENS CAPSULE STUDY N/A 12/08/2013   Procedure: GIVENS CAPSULE STUDY;  Surgeon: [name], MD;  Location: [facility];  Service: Endoscopy;  Laterality: N/A;  . LEFT HEART CATH AND CORONARY ANGIOGRAPHY N/A 12/03/2017   Procedure: LEFT HEART CATH AND CORONARY ANGIOGRAPHY;  Surgeon: Nelva Bush, MD;  Location: [facility];  Service: Cardiovascular;  Laterality: N/A;  . LIPOMA EXCISION  01/28/11   neck  Social History: Social History   Tobacco Use  . Smoking status: Former Smoker    Packs/day: 0.25    Types: Cigarettes  . Smokeless tobacco: Never Used  Vaping Use  . Vaping Use: Never used  Substance Use Topics  . Alcohol use: Yes    Alcohol/week: 0.0 standard drinks    Comment: occasionally  . Drug use: No   Additional social history: Please also  refer to relevant sections of EMR.  Family History: Family History  Problem Relation Age of Onset  . Diabetes type II Other   . Kidney failure Other   . Kidney failure Son      Allergies and Medications: Allergies  Allergen Reactions  . Ace Inhibitors Swelling and Other (See Comments)    Eyes swell  . Lisinopril Swelling and Other (See Comments)    Swollen tongue   . Tramadol Itching   No current facility-administered medications on file prior to encounter.   Current Outpatient Medications on File Prior to Encounter  Medication Sig Dispense Refill  . albuterol (VENTOLIN HFA) 108 (90 Base) MCG/ACT inhaler Inhale 2 puffs into the lungs every 6 (six) hours as needed for wheezing or shortness of breath. 18 g 3  . amLODipine (NORVASC) 10 MG tablet TAKE 1 TABLET(10 MG) BY MOUTH DAILY (Patient taking differently: Take 10 mg by mouth daily.) 90 tablet 1  . apixaban (ELIQUIS) 2.5 MG TABS tablet Take 1 tablet (2.5 mg total) by mouth 2 (two) times daily. 60 tablet 5  . Baclofen 5 MG TABS Take 1 tablet by mouth every 8 (eight) hours as needed. 30 tablet 0  . calcitRIOL (ROCALTROL) 0.25 MCG capsule Take 0.25 mcg by mouth daily.     . diclofenac Sodium (VOLTAREN) 1 % GEL Apply 4 g topically 4 (four) times daily. Apply to hip joint 50 g 1  . escitalopram (LEXAPRO) 10 MG tablet TAKE 1 TABLET(10 MG) BY MOUTH DAILY (Patient taking differently: Take 10 mg by mouth daily.) 90 tablet 1  . famotidine (PEPCID) 20 MG tablet Take 1 tablet (20 mg total) by mouth daily. 90 tablet 1  . furosemide (LASIX) 20 MG tablet Take 1 tablet (20 mg total) by mouth daily. 90 tablet 1  . hydrALAZINE (APRESOLINE) 25 MG tablet Take 1 tablet (25 mg total) by mouth 3 (three) times daily. 270 tablet 3  . isosorbide mononitrate (IMDUR) 30 MG 24 hr tablet TAKE 1 TABLET(30 MG) BY MOUTH DAILY 90 tablet 2  . mirtazapine (REMERON) 15 MG tablet Take 1 tablet (15 mg total) by mouth at bedtime. 90 tablet 1  . nitroGLYCERIN (NITROSTAT)  0.4 MG SL tablet Place 1 tablet (0.4 mg total) under the tongue every 5 (five) minutes as needed for chest pain. If do not resolve after 1 tablet, go to ED (Patient not taking: Reported on 09/28/2020) 10 tablet 0  . potassium chloride SA (KLOR-CON) 20 MEQ tablet TAKE HALF TABLET BY MOUTH EVERY DAY 45 tablet 2  . rosuvastatin (CRESTOR) 20 MG tablet Take 1 tablet (20 mg total) by mouth at bedtime. 90 tablet 2  . Zoster Vaccine Adjuvanted [facility]) injection Administer second dose after 2 months after the initial dose 0.5 mL 1    Objective: BP (!) 188/154   Pulse 82   Temp 98.5 F (36.9 C) (Oral)   Resp 19   Ht 5\' 4"  (1.626 m)   Wt 57.2 kg   SpO2 97%   BMI 21.63 kg/m  Exam: General: Patient sitting upright in bed,  in no acute distress. Neck: supple neck, without evidence of lymphadenopathy  Cardiovascular: irregularly irregular rhythm, no murmurs or gallops auscutated Respiratory: CTAB, normal WOB on room air Gastrointestinal: soft, tenderness on deep palpation along LUQ, no evidence of organomegaly MSK: no LE edema noted bilaterally, radial and distal pulses strong and equal bilaterally Derm: skin warm and dry to touch, no rashes or lesions noted  Neuro: AOx4, no focal deficits  Psych: mood appropriate   Labs and Imaging: CBC BMET  Recent Labs  Lab 10/25/20 1150  WBC 10.2  HGB 12.7  HCT 35.4*  PLT 160   Recent Labs  Lab 10/25/20 1150  NA 138  K 4.0  CL 104  CO2 23  BUN 23  CREATININE 3.04*  GLUCOSE 109*  CALCIUM 9.8     EKG: normal rate, atrial fibrillation   CT Abdomen Pelvis Wo Contrast  Result Date: 10/25/2020 CLINICAL DATA:  75 year old female with acute abdominal pain. EXAM: CT CHEST, ABDOMEN AND PELVIS WITHOUT CONTRAST TECHNIQUE: Multidetector CT imaging of the chest, abdomen and pelvis was performed following the standard protocol without IV contrast. COMPARISON:  Chest radiograph dated 10/25/2020 and CT abdomen pelvis dated 09/04/2019 and CT dated  04/03/2017. FINDINGS: Evaluation of this exam is limited in the absence of intravenous contrast. CT CHEST FINDINGS Cardiovascular: There is mild cardiomegaly. Advanced 3 vessel coronary vascular calcification. Small pericardial effusion measuring 9 mm in thickness anterior to the heart. There is advanced atherosclerotic calcification of the thoracic aorta. The central pulmonary arteries are unremarkable. Mediastinum/Nodes: No hilar or mediastinal adenopathy. The esophagus is grossly unremarkable. No mediastinal fluid collection. Enlarged nodular thyroid gland may represent multinodular goiter. This has been evaluated on previous imaging. (ref: J Am Coll Radiol. 2015 Feb;12(2): 143-50).No mediastinal fluid collection. Lungs/Pleura: Small left pleural effusion with minimal left lung base atelectasis. There is background of mild centrilobular emphysema. No pneumothorax. The central airways are patent. Musculoskeletal: No acute osseous pathology. CT ABDOMEN PELVIS FINDINGS No intra-abdominal free air.  Small free fluid in the pelvis. Hepatobiliary: The liver is grossly unremarkable. Small gallstone. No definite pericholecystic fluid. Pancreas: Unremarkable. No pancreatic ductal dilatation or surrounding inflammatory changes. Spleen: Several wedge-shaped splenic hypodensities most consistent with areas of infarct. Clinical correlation is recommended. No perisplenic fluid collection. Adrenals/Urinary Tract: Mild thickened appearance of the adrenal glands. Vascular calcifications versus less likely nonobstructing bilateral renal calculi. There is no hydronephrosis or obstructing stone. Bilateral renal cortical irregularity. Several small ill-defined bilateral renal hypodense lesions are not characterized on this noncontrast CT. The visualized ureters and urinary bladder appear unremarkable. Stomach/Bowel: There is severe distal colonic diverticulosis without active inflammatory change. There is distal duodenal diverticula  measuring up to 4 cm. There is no bowel obstruction or active inflammation. The appendix is not visualized with certainty. No inflammatory changes identified in the right lower quadrant. Vascular/Lymphatic: Advanced aortoiliac atherosclerotic disease. The aorta is ectatic. The IVC is unremarkable. No portal venous gas. There is no adenopathy. Reproductive: Hysterectomy. Similar appearance of the right ovary with 2.7 cm hypodense lesion as seen on the prior CT, likely a benign or indolent process. Other: None Musculoskeletal: Degenerative changes the spine. No acute osseous pathology. IMPRESSION: 1. Small left pleural effusion with minimal left lung base atelectasis. 2. Several wedge-shaped splenic hypodensities most consistent with areas of infarct. Clinical correlation is recommended. 3. Cholelithiasis. 4. Severe distal colonic diverticulosis. No bowel obstruction. 5. Aortic Atherosclerosis (ICD10-I70.0) and Emphysema (ICD10-J43.9). Electronically Signed   By: Anner Crete M.D.   On: 10/25/2020 16:42  CT Chest Wo Contrast  Result Date: 10/25/2020 CLINICAL DATA:  75 year old female with acute abdominal pain. EXAM: CT CHEST, ABDOMEN AND PELVIS WITHOUT CONTRAST TECHNIQUE: Multidetector CT imaging of the chest, abdomen and pelvis was performed following the standard protocol without IV contrast. COMPARISON:  Chest radiograph dated 10/25/2020 and CT abdomen pelvis dated 09/04/2019 and CT dated 04/03/2017. FINDINGS: Evaluation of this exam is limited in the absence of intravenous contrast. CT CHEST FINDINGS Cardiovascular: There is mild cardiomegaly. Advanced 3 vessel coronary vascular calcification. Small pericardial effusion measuring 9 mm in thickness anterior to the heart. There is advanced atherosclerotic calcification of the thoracic aorta. The central pulmonary arteries are unremarkable. Mediastinum/Nodes: No hilar or mediastinal adenopathy. The esophagus is grossly unremarkable. No mediastinal fluid  collection. Enlarged nodular thyroid gland may represent multinodular goiter. This has been evaluated on previous imaging. (ref: J Am Coll Radiol. 2015 Feb;12(2): 143-50).No mediastinal fluid collection. Lungs/Pleura: Small left pleural effusion with minimal left lung base atelectasis. There is background of mild centrilobular emphysema. No pneumothorax. The central airways are patent. Musculoskeletal: No acute osseous pathology. CT ABDOMEN PELVIS FINDINGS No intra-abdominal free air.  Small free fluid in the pelvis. Hepatobiliary: The liver is grossly unremarkable. Small gallstone. No definite pericholecystic fluid. Pancreas: Unremarkable. No pancreatic ductal dilatation or surrounding inflammatory changes. Spleen: Several wedge-shaped splenic hypodensities most consistent with areas of infarct. Clinical correlation is recommended. No perisplenic fluid collection. Adrenals/Urinary Tract: Mild thickened appearance of the adrenal glands. Vascular calcifications versus less likely nonobstructing bilateral renal calculi. There is no hydronephrosis or obstructing stone. Bilateral renal cortical irregularity. Several small ill-defined bilateral renal hypodense lesions are not characterized on this noncontrast CT. The visualized ureters and urinary bladder appear unremarkable. Stomach/Bowel: There is severe distal colonic diverticulosis without active inflammatory change. There is distal duodenal diverticula measuring up to 4 cm. There is no bowel obstruction or active inflammation. The appendix is not visualized with certainty. No inflammatory changes identified in the right lower quadrant. Vascular/Lymphatic: Advanced aortoiliac atherosclerotic disease. The aorta is ectatic. The IVC is unremarkable. No portal venous gas. There is no adenopathy. Reproductive: Hysterectomy. Similar appearance of the right ovary with 2.7 cm hypodense lesion as seen on the prior CT, likely a benign or indolent process. Other: None  Musculoskeletal: Degenerative changes the spine. No acute osseous pathology. IMPRESSION: 1. Small left pleural effusion with minimal left lung base atelectasis. 2. Several wedge-shaped splenic hypodensities most consistent with areas of infarct. Clinical correlation is recommended. 3. Cholelithiasis. 4. Severe distal colonic diverticulosis. No bowel obstruction. 5. Aortic Atherosclerosis (ICD10-I70.0) and Emphysema (ICD10-J43.9). Electronically Signed   By: Anner Crete M.D.   On: 10/25/2020 16:42   DG Chest Port 1 View  Result Date: 10/25/2020 CLINICAL DATA:  Pain with shortness of breath and cough EXAM: PORTABLE CHEST 1 VIEW COMPARISON:  December 25, 2019 FINDINGS: Lungs are clear. There is cardiomegaly with pulmonary vascularity normal. No adenopathy. There is aortic atherosclerosis. No bone lesions. IMPRESSION: Cardiomegaly.  Lungs clear. Aortic Atherosclerosis (ICD10-I70.0). Electronically Signed   By: Lowella Grip III M.D.   On: 10/25/2020 12:34    Donney Dice, DO 10/25/2020, 8:43 PM PGY-1, [place] [facility] pager: [phone], text pages welcome  FPTS Upper-Level Resident Addendum   I have independently interviewed and examined the patient. I have discussed the above with the original author and agree with their documentation. Please see also any attending notes.    Carollee Leitz MD PGY-3, [facility] 10/25/2020 10:30 PM  FPTS  Service pager: [phone] (text pages welcome through [facility])

## 2020-10-25 NOTE — ED Triage Notes (Signed)
Pt back to the ED lwbs 2 day ago , pt states today that she has some epigastric pain and has been told that she has reflux but never took meds for it

## 2020-10-25 NOTE — Assessment & Plan Note (Addendum)
Patient is in a significant amount of pain in exam room.  She does not think that she has been taking pantoprazole.  She is also on Eliquis.  She has a history of prior peptic ulcer disease.  Concerned that she may have had a perforation of a peptic ulcer versus other intra-abdominal process.  Given that she is so incredibly uncomfortable and that we cannot rule out a perforation in the office, she is being sent to the emergency room.  She agrees with this.  Also seen by her PCP Dr. [name] who agrees with sending her to the hospital.  Also discussed with attending, Dr. Owens Shark, who agrees to ED evaluation.  Called charge nurse in ED and advised that patient will be coming over.  She will be transported by wheelchair to triage by [facility].  She should follow-up with PCP after ED or admission.

## 2020-10-25 NOTE — ED Provider Notes (Signed)
  Physical Exam  BP (!) 195/86   Pulse 70   Temp 98.5 F (36.9 C) (Oral)   Resp 18   Ht 5\' 4"  (1.626 m)   Wt 57.2 kg   SpO2 97%   BMI 21.63 kg/m   Physical Exam  ED Course/Procedures   Clinical Course as of 10/25/20 1718  Thu Oct 25, 2020  1219 Chest x-ray interpreted by me as cardiomegaly no gross infiltrates. [MB]  3664 Patient's blood pressure was quite elevated.  She did not take her blood pressure medicine this morning. [MB]  1416 Creatinine(!): 3.04 [MB]    Clinical Course User Index [MB] Hayden Rasmussen, MD    Procedures  MDM  Patient care assumed at 4 PM.  Patient is here with persistent abdominal pain.  Patient is hypertensive.  Patient has baseline renal failure with a creatinine of 3.  Signout pending CT chest abdomen pelvis  5:18 PM CT showed several web space hypodensities in the spleen.  Patient is already on Eliquis.  I am concerned for splenic infarction causing her pain.  Patient's blood pressure is down to 160s after pain medicine and IV hydralazine.  Since patient is already on Eliquis I will start patient on heparin and patient may need to be bridged to Coumadin.  At this point, patient will be admitted for splenic infarct   CRITICAL CARE Performed by: Wandra Arthurs   Total critical care time: 30 minutes  Critical care time was exclusive of separately billable procedures and treating other patients.  Critical care was necessary to treat or prevent imminent or life-threatening deterioration.  Critical care was time spent personally by me on the following activities: development of treatment plan with patient and/or surrogate as well as nursing, discussions with consultants, evaluation of patient's response to treatment, examination of patient, obtaining history from patient or surrogate, ordering and performing treatments and interventions, ordering and review of laboratory studies, ordering and review of radiographic studies, pulse oximetry and  re-evaluation of patient's condition.      Drenda Freeze, MD 10/25/20 [phone]

## 2020-10-25 NOTE — Progress Notes (Signed)
    SUBJECTIVE:   CHIEF COMPLAINT / HPI:   Abdominal Pain All over abdomen Went to ED on 2/22 for similar pain, but left without being seen Had continue to be in pain, but worsened overnight Has not been able to eat due to the pain Rates pani 8/10 Can't take a deep breath due to pain Previous admission on January 2021 for abdominal pain and upper GI bleed, underwent EGD at that time and found to have a nonbleeding superficial gastric ulcers, started on PPI twice daily. Has not noted any new melanotic stools Last Bm 1-2 days ago, had to take stool softener, but it was normal Hasn't noticed any blood in stool  Appt 3/3 for CT Lung Cancer screening  PERTINENT  PMH / PSH: HTN, history of thoracic aortic aneurysm, atrial fibrillation, CAD, history of upper GI bleed with peptic ulcer disease, HLD, anxiety and depression  OBJECTIVE:   BP (!) 178/102   Pulse 80   Ht 5\' 4"  (1.626 m)   SpO2 97%   BMI 21.63 kg/m    Physical Exam:  General: 75 y.o. female very uncomfortable, crying in pain, has to lay on exam table  Cardio: RRR no m/r/g Lungs: CTAB, quick, shallow breathing Abdomen: Soft, diffusely TTP especially over epigastrium with guarding Skin: warm and dry   ASSESSMENT/PLAN:   Abdominal pain Patient is in a significant amount of pain in exam room.  She does not think that she has been taking pantoprazole.  She is also on Eliquis.  She has a history of prior peptic ulcer disease.  Concerned that she may have had a perforation of a peptic ulcer versus other intra-abdominal process.  Given that she is so incredibly uncomfortable and that we cannot rule out a perforation in the office, she is being sent to the emergency room.  She agrees with this.  Also seen by her PCP Dr. [name] who agrees with sending her to the hospital.  Also discussed with attending, Dr. Owens Shark, who agrees to ED evaluation.  Called charge nurse in ED and advised that patient will be coming over.  She will be  transported by wheelchair to triage by [facility].  She should follow-up with PCP after ED or admission.     Cleophas Dunker, [place]

## 2020-10-25 NOTE — Progress Notes (Signed)
ANTICOAGULATION CONSULT NOTE - Initial Consult  Pharmacy Consult for heparin Indication: splenic infarct, hx of atrial fibrillation  Allergies  Allergen Reactions  . Ace Inhibitors Swelling and Other (See Comments)    Eyes swell  . Lisinopril Swelling and Other (See Comments)    Swollen tongue   . Tramadol Itching    Patient Measurements: Height: 5\' 4"  (162.6 cm) Weight: 57.2 kg (126 lb) IBW/kg (Calculated) : 54.7 Heparin Dosing Weight: 57.2  Vital Signs: Temp: 98.5 F (36.9 C) (02/24 1058) Temp Source: Oral (02/24 1058) BP: 195/86 (02/24 1648) Pulse Rate: 70 (02/24 1645)  Labs: Recent Labs    10/23/20 1818 10/25/20 1150 10/25/20 1551  HGB 10.2* 12.7  --   HCT 30.1* 35.4*  --   PLT 155 160  --   CREATININE 3.31* 3.04*  --   TROPONINIHS  --  53* 48*    Estimated Creatinine Clearance: 14.4 mL/min (A) (by C-G formula based on SCr of 3.04 mg/dL (H)).   Medical History: Past Medical History:  Diagnosis Date  . Acute on chronic blood loss anemia 06/26/2015  . Acute respiratory failure with hypoxia ([place]) 02/21/2018  . Aortic atherosclerosis ([place]) 01/01/2018   CT 04/2017  . Asthma   . Atrial fibrillation ([place]) 10/13/2017   Newly diagnosed in Jan 2019 // Apixaban for anticoag // Apixaban held in 2019 for anemia but resumed; Hgb stable since restarting  . CAD (coronary artery disease) 11/27/2017   Nuc stress 3/19: anterior ischemia, ?inf scar, EF 50, intermediate risk // LHC 4/19: LAD mild dz, pLCx 35, OM2 30, mRCA 60, dRCA 65, LVEDP 15-20  . Chronic diastolic CHF (congestive heart failure) ([place]) 03/23/2018   Echo 2/19: Moderate LVH, EF 37-34, grade 2 diastolic dysfunction, trivial MR, moderate to severe LAE, PASP 30//Echocardiogram 7/21: EF 55-60, severe LVH, severe LAE, mod RAE, mild MR, mild dilation of Asc aorta (40 mm), RVSP 27.6   . Chronic kidney disease (CKD), stage III (moderate) ([place]) 06/27/2015  . DIVERTICULAR BLEEDING, HX OF 10/14/2007   Colonoscopy 2008 showed  diverticulosis.   [name] GERD (gastroesophageal reflux disease)   . Gout   . Hyperlipemia   . Hypertension   . Proteinuria 01/11/2016  . Thoracic aortic aneurysm 04/03/2017   CT 8/18: ascending thoracic aorta 4.1 cm // unable to do CTA due to CKD // Chest MRA 05/2019: Ascending thoracic aorta 40 mm    Medications:  Infusions:  . heparin      Assessment: 75 yo admitted for epigastric pain.  On Eliquis PTA for atrial fibrillation and splenic infarct.  Last Eliquis dose 2/23 PM.  Pharmacy consulted to dose heparin.  Will keep heparin for now in case GI procedure needed.  CBC stable.  Goal of Therapy:  Heparin level 0.3-0.7 units/ml Monitor platelets by anticoagulation protocol: Yes   Plan:  No heparin bolus due to Eliquis Start heparin drip at 900 units/hr (~16 mg/kg/hr) F/u 8 hour HL Monitor CBC, s/sx bleeding F/u ability to transition back to [employer] 10/25/2020,5:17 PM

## 2020-10-25 NOTE — Hospital Course (Addendum)
Jennifer Foster is a 75 y.o. female presenting with generalized abdominal pain . PMH is significant for HTN, atrial fibrillation, CAD, history of upper GI bleed with peptic ulcer disease, anxiety, depression and hyperlipidemia.   Abdominal Pain Patient presents with abdominal pain that has persisted for 2 days. Seen by [facility] and noted to have significant amount of abdominal pain in the clinic and recommended to have further evaluation in the ED.  CXR notable for cardiomegaly with lungs clear. CT chest and  abdomen/pelvis noted to small left pleural effusion with minimal left lung base atelactasis with several wedge-shaped splenic hypodensities most consistent with areas of infarct, cholelithiasis, severe distal colonic diverticulosis without evidence of bowel obstruction. In the ED, patient taken off eliquis and placed on heparin. Given morphine 4 mg, for pain. She also received protonix and zofran.   Hypertensive urgency  BP on admission 202/87 on admission. She was continued on her home hydralazine 25 mg TID with improvement in her blood pressures prior to discharge.   Atrial Fibrillation  EKG on admission notable for atrial fibrillation. Her home medication eliquis was held and she was started on heparin. By time of discharge patient was placed of full dose apixaban 5mg  twice daily   Follow up Items  Continue apixaban 5mg  twice daily

## 2020-10-25 NOTE — ED Notes (Signed)
Pt does not have to urinate at this time. Will let us know when needs to void

## 2020-10-25 NOTE — ED Provider Notes (Signed)
[facility] EMERGENCY DEPARTMENT Provider Note   CSN: [id number] Arrival date & time: 10/25/20  1034     History No chief complaint on file.   Jennifer Foster is a 75 y.o. female.  She is brought in by ambulance for evaluation of upper abdominal pain.  She said it started overnight.  Aching in nature.  Had a hard stool yesterday and took a laxative.  No fevers or chills no nausea or vomiting.  She went to her primary care doctor today who sent her here.  History of A. fib and is on anticoagulation.  Was here a few days ago but left without being seen.  For similar presentation.  States she has a history of reflux but unclear if she takes any medication for it.  The history is provided by the patient and the EMS personnel.  Abdominal Pain Pain location:  Generalized Pain quality: aching   Pain severity:  Moderate Onset quality:  Gradual Duration:  2 days Timing:  Constant Progression:  Worsening Chronicity:  Recurrent Context: not trauma   Relieved by:  None tried Worsened by:  Nothing Ineffective treatments:  None tried Associated symptoms: constipation and shortness of breath (sometimes)   Associated symptoms: no chest pain, no cough, no diarrhea, no dysuria, no fever, no hematemesis, no hematochezia, no hematuria, no nausea, no sore throat and no vomiting        Past Medical History:  Diagnosis Date  . Acute on chronic blood loss anemia 06/26/2015  . Acute respiratory failure with hypoxia ([place]) 02/21/2018  . Aortic atherosclerosis ([place]) 01/01/2018   CT 04/2017  . Asthma   . Atrial fibrillation ([place]) 10/13/2017   Newly diagnosed in Jan 2019 // Apixaban for anticoag // Apixaban held in 2019 for anemia but resumed; Hgb stable since restarting  . CAD (coronary artery disease) 11/27/2017   Nuc stress 3/19: anterior ischemia, ?inf scar, EF 50, intermediate risk // LHC 4/19: LAD mild dz, pLCx 35, OM2 30, mRCA 60, dRCA 65, LVEDP 15-20  . Chronic diastolic CHF  (congestive heart failure) ([place]) 03/23/2018   Echo 2/19: Moderate LVH, EF 86-57, grade 2 diastolic dysfunction, trivial MR, moderate to severe LAE, PASP 30//Echocardiogram 7/21: EF 55-60, severe LVH, severe LAE, mod RAE, mild MR, mild dilation of Asc aorta (40 mm), RVSP 27.6   . Chronic kidney disease (CKD), stage III (moderate) ([place]) 06/27/2015  . DIVERTICULAR BLEEDING, HX OF 10/14/2007   Colonoscopy 2008 showed diverticulosis.   [name] GERD (gastroesophageal reflux disease)   . Gout   . Hyperlipemia   . Hypertension   . Proteinuria 01/11/2016  . Thoracic aortic aneurysm 04/03/2017   CT 8/18: ascending thoracic aorta 4.1 cm // unable to do CTA due to CKD // Chest MRA 05/2019: Ascending thoracic aorta 40 mm    Patient Active Problem List   Diagnosis Date Noted  . Abdominal pain 10/25/2020  . Sciatica of right side without back pain 07/12/2020  . Acquired thrombophilia ([place]) 01/24/2020  . Thigh pain 01/24/2020  . Hypokalemia 01/24/2020  . History of cocaine abuse ([place]) 10/03/2019  . Upper GI bleed 09/04/2019  . Chronic diastolic CHF (congestive heart failure) ([place]) 03/23/2018  . Aortic atherosclerosis ([place]) 01/01/2018  . CAD (coronary artery disease) 11/27/2017  . Atrial fibrillation ([place]) 10/13/2017  . Multinodular goiter 04/14/2017  . Adrenal adenoma 04/14/2017  . Thoracic aortic aneurysm without rupture ([place]) 04/03/2017  . Weight loss 01/30/2017  . Anxiety and depression 12/25/2015  . Chronic kidney disease,  stage 3 ([place]) 06/27/2015  . Essential hypertension   . Gout 02/28/2014  . Lipoma 03/19/2011  . Hyperlipidemia 01/01/2011  . Chronic anemia 11/23/2009  . Tobacco abuse 09/03/2009    Past Surgical History:  Procedure Laterality Date  . ABDOMINAL HYSTERECTOMY  1981   partial, per pt history  . BIOPSY  09/05/2019   Procedure: BIOPSY;  Surgeon: Ronnette Juniper, MD;  Location: [facility];  Service: Gastroenterology;;  . COLONOSCOPY N/A 12/08/2013   Procedure: COLONOSCOPY;  Surgeon:  [name], MD;  Location: [facility];  Service: Endoscopy;  Laterality: N/A;  . COLONOSCOPY N/A 06/28/2015   Procedure: COLONOSCOPY;  Surgeon: Clarene Essex, MD;  Location: [facility] ENDOSCOPY;  Service: Endoscopy;  Laterality: N/A;  . ESOPHAGOGASTRODUODENOSCOPY N/A 12/08/2013   Procedure: ESOPHAGOGASTRODUODENOSCOPY (EGD);  Surgeon: [name], MD;  Location[facility] END[facility]COPY;  Service: Endoscopy;  Laterality: N/A;  . ESOPHAGOGASTRODUODENOSCOPY (EGD) WITH PROPOFOL N/A 09/05/2019   Procedure: ESOPHAGOGASTRODUODENOSCOPY (EGD) WITH PROPOFOL;  Surgeon: Ronnette Juniper, MD;  Location: [facility] ENDOSCOPY;  Service: Gastroenterology;  Laterality: N/A;  . GIVENS CAPSULE STUDY N/A 12/08/2013   Procedure: GIVENS CAPSULE STUDY;  Surgeon: [name], MD;  Location: [facility];  Service: Endoscopy;  Laterality: N/A;  . LEFT HEART CATH AND CORONARY ANGIOGRAPHY N/A 12/03/2017   Procedure: LEFT HEART CATH AND CORONARY ANGIOGRAPHY;  Surgeon: Nelva Bush, MD;  Location: [facility] CV LAB;  Service: Cardiovascular;  Laterality: N/A;  . LIPOMA EXCISION  01/28/11   neck     OB History   No obstetric history on file.     Family History  Problem Relation Age of Onset  . Diabetes type II Other   . Kidney failure Other   . Kidney failure Son     Social History   Tobacco Use  . Smoking status: Former Smoker    Packs/day: 0.25    Types: Cigarettes  . Smokeless tobacco: Never Used  Vaping Use  . Vaping Use: Never used  Substance Use Topics  . Alcohol use: Yes    Alcohol/week: 0.0 standard drinks    Comment: occasionally  . Drug use: No    Home Medications Prior to Admission medications   Medication Sig Start Date End Date Taking? Authorizing Provider  albuterol (VENTOLIN HFA) 108 (90 Base) MCG/ACT inhaler Inhale 2 puffs into the lungs every 6 (six) hours as needed for wheezing or shortness of breath. Patient not taking: Reported on 09/28/2020 12/27/19   Regalado, Jerald Kief A, MD  amLODipine (NORVASC) 10 MG  tablet TAKE 1 TABLET(10 MG) BY MOUTH DAILY 09/03/20   [name], MD  apixaban (ELIQUIS) 2.5 MG TABS tablet Take 1 tablet (2.5 mg total) by mouth 2 (two) times daily. 03/30/20   Nahser, Wonda Cheng, MD  Baclofen 5 MG TABS Take 1 tablet by mouth every 8 (eight) hours as needed. Patient not taking: Reported on 09/28/2020 07/12/20   Patriciaann Clan, DO  calcitRIOL (ROCALTROL) 0.25 MCG capsule Take 0.25 mcg by mouth daily.  12/24/18   [provider]  diclofenac Sodium (VOLTAREN) 1 % GEL Apply 4 g topically 4 (four) times daily. Apply to hip joint 04/17/20   Andrena Mews T, MD  escitalopram (LEXAPRO) 10 MG tablet TAKE 1 TABLET(10 MG) BY MOUTH DAILY 06/18/20   [name], MD  famotidine (PEPCID) 20 MG tablet Take 1 tablet (20 mg total) by mouth daily. 01/17/20   [name], MD  furosemide (LASIX) 20 MG tablet Take 1 tablet (20 mg total) by  mouth daily. 01/17/20   [name], MD  hydrALAZINE (APRESOLINE) 25 MG tablet Take 1 tablet (25 mg total) by mouth 3 (three) times daily. 05/11/20   Richardson Dopp T, PA-C  isosorbide mononitrate (IMDUR) 30 MG 24 hr tablet TAKE 1 TABLET(30 MG) BY MOUTH DAILY 10/01/20   Nahser, Wonda Cheng, MD  mirtazapine (REMERON) 15 MG tablet Take 1 tablet (15 mg total) by mouth at bedtime. 04/06/20   [name], MD  nitroGLYCERIN (NITROSTAT) 0.4 MG SL tablet Place 1 tablet (0.4 mg total) under the tongue every 5 (five) minutes as needed for chest pain. If do not resolve after 1 tablet, go to ED Patient not taking: Reported on 09/28/2020 10/13/17   Smiley Houseman, MD  potassium chloride SA (KLOR-CON) 20 MEQ tablet TAKE HALF TABLET BY MOUTH EVERY DAY 04/05/20   Richardson Dopp T, PA-C  rosuvastatin (CRESTOR) 20 MG tablet Take 1 tablet (20 mg total) by mouth at bedtime. 11/08/19   [name], MD  Zoster Vaccine Adjuvanted [facility]) injection Administer second dose after 2 months after the initial dose 09/28/20   [name], MD    Allergies     Ace inhibitors, Lisinopril, and Tramadol  Review of Systems   Review of Systems  Constitutional: Negative for fever.  HENT: Negative for sore throat.   Eyes: Negative for visual disturbance.  Respiratory: Positive for shortness of breath (sometimes). Negative for cough.   Cardiovascular: Negative for chest pain.  Gastrointestinal: Positive for abdominal pain and constipation. Negative for diarrhea, hematemesis, hematochezia, nausea and vomiting.  Genitourinary: Negative for dysuria and hematuria.  Musculoskeletal: Negative for neck pain.  Skin: Negative for rash.  Neurological: Negative for headaches.    Physical Exam Updated Vital Signs BP (!) 192/100 (BP Location: Left Arm)   Pulse 88   Temp 98.5 F (36.9 C) (Oral)   Resp 18   SpO2 98%   Physical Exam Vitals and nursing note reviewed.  Constitutional:      General: She is not in acute distress.    Appearance: Normal appearance. She is well-developed and well-nourished.  HENT:     Head: Normocephalic and atraumatic.  Eyes:     Conjunctiva/sclera: Conjunctivae normal.  Cardiovascular:     Rate and Rhythm: Normal rate and regular rhythm.     Heart sounds: No murmur heard.   Pulmonary:     Effort: Pulmonary effort is normal. No respiratory distress.     Breath sounds: Normal breath sounds.  Abdominal:     Palpations: Abdomen is soft.     Tenderness: There is abdominal tenderness (diffuse). There is no guarding or rebound.  Musculoskeletal:        General: No deformity, signs of injury or edema. Normal range of motion.     Cervical back: Neck supple.     Right lower leg: No edema.     Left lower leg: No edema.  Skin:    General: Skin is warm and dry.     Capillary Refill: Capillary refill takes less than 2 seconds.  Neurological:     General: No focal deficit present.     Mental Status: She is alert.  Psychiatric:        Mood and Affect: Mood and affect normal.     ED Results / Procedures / Treatments    Labs (all labs ordered are listed, but only abnormal results are displayed) Labs Reviewed  COMPREHENSIVE METABOLIC PANEL - Abnormal; Notable for the following components:  Result Value   Glucose, Bld 109 (*)    Creatinine, Ser 3.04 (*)    Albumin 3.1 (*)    GFR, Estimated 16 (*)    All other components within normal limits  CBC WITH DIFFERENTIAL/PLATELET - Abnormal; Notable for the following components:   HCT 35.4 (*)    Neutro Abs 8.7 (*)    Lymphs Abs 0.5 (*)    All other components within normal limits  HEPARIN LEVEL (UNFRACTIONATED) - Abnormal; Notable for the following components:   Heparin Unfractionated 0.14 (*)    All other components within normal limits  COMPREHENSIVE METABOLIC PANEL - Abnormal; Notable for the following components:   CO2 19 (*)    BUN 26 (*)    Creatinine, Ser 3.13 (*)    Total Protein 6.1 (*)    Albumin 2.7 (*)    GFR, Estimated 15 (*)    All other components within normal limits  CBC - Abnormal; Notable for the following components:   HCT 34.3 (*)    Platelets 129 (*)    All other components within normal limits  APTT - Abnormal; Notable for the following components:   aPTT 44 (*)    All other components within normal limits  TROPONIN I (HIGH SENSITIVITY) - Abnormal; Notable for the following components:   Troponin I (High Sensitivity) 53 (*)    All other components within normal limits  TROPONIN I (HIGH SENSITIVITY) - Abnormal; Notable for the following components:   Troponin I (High Sensitivity) 48 (*)    All other components within normal limits  SARS CORONAVIRUS 2 (TAT 6-24 HRS)  LIPASE, BLOOD  PROTIME-INR  HEPARIN LEVEL (UNFRACTIONATED)    EKG EKG Interpretation  Date/Time:  Thursday October 25 2020 15:17:21 EST Ventricular Rate:  81 PR Interval:    QRS Duration: 105 QT Interval:  399 QTC Calculation: 464 R Axis:   -49 Text Interpretation: Atrial fibrillation Incomplete RBBB and LAFB LVH with secondary repolarization  abnormality Baseline wander in lead(s) V5 No significant change since prior 4/21 Confirmed by Aletta Edouard [phone]) on 10/25/2020 3:19:11 PM   Radiology CT Abdomen Pelvis Wo Contrast  Result Date: 10/25/2020 CLINICAL DATA:  75 year old female with acute abdominal pain. EXAM: CT CHEST, ABDOMEN AND PELVIS WITHOUT CONTRAST TECHNIQUE: Multidetector CT imaging of the chest, abdomen and pelvis was performed following the standard protocol without IV contrast. COMPARISON:  Chest radiograph dated 10/25/2020 and CT abdomen pelvis dated 09/04/2019 and CT dated 04/03/2017. FINDINGS: Evaluation of this exam is limited in the absence of intravenous contrast. CT CHEST FINDINGS Cardiovascular: There is mild cardiomegaly. Advanced 3 vessel coronary vascular calcification. Small pericardial effusion measuring 9 mm in thickness anterior to the heart. There is advanced atherosclerotic calcification of the thoracic aorta. The central pulmonary arteries are unremarkable. Mediastinum/Nodes: No hilar or mediastinal adenopathy. The esophagus is grossly unremarkable. No mediastinal fluid collection. Enlarged nodular thyroid gland may represent multinodular goiter. This has been evaluated on previous imaging. (ref: J Am Coll Radiol. 2015 Feb;12(2): 143-50).No mediastinal fluid collection. Lungs/Pleura: Small left pleural effusion with minimal left lung base atelectasis. There is background of mild centrilobular emphysema. No pneumothorax. The central airways are patent. Musculoskeletal: No acute osseous pathology. CT ABDOMEN PELVIS FINDINGS No intra-abdominal free air.  Small free fluid in the pelvis. Hepatobiliary: The liver is grossly unremarkable. Small gallstone. No definite pericholecystic fluid. Pancreas: Unremarkable. No pancreatic ductal dilatation or surrounding inflammatory changes. Spleen: Several wedge-shaped splenic hypodensities most consistent with areas of infarct. Clinical correlation is recommended.  No perisplenic  fluid collection. Adrenals/Urinary Tract: Mild thickened appearance of the adrenal glands. Vascular calcifications versus less likely nonobstructing bilateral renal calculi. There is no hydronephrosis or obstructing stone. Bilateral renal cortical irregularity. Several small ill-defined bilateral renal hypodense lesions are not characterized on this noncontrast CT. The visualized ureters and urinary bladder appear unremarkable. Stomach/Bowel: There is severe distal colonic diverticulosis without active inflammatory change. There is distal duodenal diverticula measuring up to 4 cm. There is no bowel obstruction or active inflammation. The appendix is not visualized with certainty. No inflammatory changes identified in the right lower quadrant. Vascular/Lymphatic: Advanced aortoiliac atherosclerotic disease. The aorta is ectatic. The IVC is unremarkable. No portal venous gas. There is no adenopathy. Reproductive: Hysterectomy. Similar appearance of the right ovary with 2.7 cm hypodense lesion as seen on the prior CT, likely a benign or indolent process. Other: None Musculoskeletal: Degenerative changes the spine. No acute osseous pathology. IMPRESSION: 1. Small left pleural effusion with minimal left lung base atelectasis. 2. Several wedge-shaped splenic hypodensities most consistent with areas of infarct. Clinical correlation is recommended. 3. Cholelithiasis. 4. Severe distal colonic diverticulosis. No bowel obstruction. 5. Aortic Atherosclerosis (ICD10-I70.0) and Emphysema (ICD10-J43.9). Electronically Signed   By: Anner Crete M.D.   On: 10/25/2020 16:42   CT Chest Wo Contrast  Result Date: 10/25/2020 CLINICAL DATA:  75 year old female with acute abdominal pain. EXAM: CT CHEST, ABDOMEN AND PELVIS WITHOUT CONTRAST TECHNIQUE: Multidetector CT imaging of the chest, abdomen and pelvis was performed following the standard protocol without IV contrast. COMPARISON:  Chest radiograph dated 10/25/2020 and CT  abdomen pelvis dated 09/04/2019 and CT dated 04/03/2017. FINDINGS: Evaluation of this exam is limited in the absence of intravenous contrast. CT CHEST FINDINGS Cardiovascular: There is mild cardiomegaly. Advanced 3 vessel coronary vascular calcification. Small pericardial effusion measuring 9 mm in thickness anterior to the heart. There is advanced atherosclerotic calcification of the thoracic aorta. The central pulmonary arteries are unremarkable. Mediastinum/Nodes: No hilar or mediastinal adenopathy. The esophagus is grossly unremarkable. No mediastinal fluid collection. Enlarged nodular thyroid gland may represent multinodular goiter. This has been evaluated on previous imaging. (ref: J Am Coll Radiol. 2015 Feb;12(2): 143-50).No mediastinal fluid collection. Lungs/Pleura: Small left pleural effusion with minimal left lung base atelectasis. There is background of mild centrilobular emphysema. No pneumothorax. The central airways are patent. Musculoskeletal: No acute osseous pathology. CT ABDOMEN PELVIS FINDINGS No intra-abdominal free air.  Small free fluid in the pelvis. Hepatobiliary: The liver is grossly unremarkable. Small gallstone. No definite pericholecystic fluid. Pancreas: Unremarkable. No pancreatic ductal dilatation or surrounding inflammatory changes. Spleen: Several wedge-shaped splenic hypodensities most consistent with areas of infarct. Clinical correlation is recommended. No perisplenic fluid collection. Adrenals/Urinary Tract: Mild thickened appearance of the adrenal glands. Vascular calcifications versus less likely nonobstructing bilateral renal calculi. There is no hydronephrosis or obstructing stone. Bilateral renal cortical irregularity. Several small ill-defined bilateral renal hypodense lesions are not characterized on this noncontrast CT. The visualized ureters and urinary bladder appear unremarkable. Stomach/Bowel: There is severe distal colonic diverticulosis without active inflammatory  change. There is distal duodenal diverticula measuring up to 4 cm. There is no bowel obstruction or active inflammation. The appendix is not visualized with certainty. No inflammatory changes identified in the right lower quadrant. Vascular/Lymphatic: Advanced aortoiliac atherosclerotic disease. The aorta is ectatic. The IVC is unremarkable. No portal venous gas. There is no adenopathy. Reproductive: Hysterectomy. Similar appearance of the right ovary with 2.7 cm hypodense lesion as seen on the prior CT, likely a benign  or indolent process. Other: None Musculoskeletal: Degenerative changes the spine. No acute osseous pathology. IMPRESSION: 1. Small left pleural effusion with minimal left lung base atelectasis. 2. Several wedge-shaped splenic hypodensities most consistent with areas of infarct. Clinical correlation is recommended. 3. Cholelithiasis. 4. Severe distal colonic diverticulosis. No bowel obstruction. 5. Aortic Atherosclerosis (ICD10-I70.0) and Emphysema (ICD10-J43.9). Electronically Signed   By: Anner Crete M.D.   On: 10/25/2020 16:42   DG Chest Port 1 View  Result Date: 10/25/2020 CLINICAL DATA:  Pain with shortness of breath and cough EXAM: PORTABLE CHEST 1 VIEW COMPARISON:  December 25, 2019 FINDINGS: Lungs are clear. There is cardiomegaly with pulmonary vascularity normal. No adenopathy. There is aortic atherosclerosis. No bone lesions. IMPRESSION: Cardiomegaly.  Lungs clear. Aortic Atherosclerosis (ICD10-I70.0). Electronically Signed   By: Lowella Grip III M.D.   On: 10/25/2020 12:34    Procedures Procedures   Medications Ordered in ED Medications  heparin ADULT infusion 100 units/mL (25000 units/26mL) (1,050 Units/hr Intravenous Handoff 10/26/20 0707)  mirtazapine (REMERON) tablet 15 mg (15 mg Oral Given 10/25/20 2211)  calcitRIOL (ROCALTROL) capsule 0.25 mcg (0.25 mcg Oral Given 10/25/20 2211)  famotidine (PEPCID) tablet 20 mg (has no administration in time range)  albuterol  (VENTOLIN HFA) 108 (90 Base) MCG/ACT inhaler 2 puff (has no administration in time range)  hydrALAZINE (APRESOLINE) tablet 25 mg (25 mg Oral Given 10/25/20 2226)  morphine 4 MG/ML injection 4 mg (4 mg Intravenous Given 10/25/20 1218)  ondansetron (ZOFRAN) injection 4 mg (4 mg Intravenous Given 10/25/20 1218)  pantoprazole (PROTONIX) injection 40 mg (40 mg Intravenous Given 10/25/20 1217)  amLODipine (NORVASC) tablet 10 mg (10 mg Oral Given 10/25/20 1340)  hydrALAZINE (APRESOLINE) injection 10 mg (10 mg Intravenous Given 10/25/20 1648)    ED Course  I have reviewed the triage vital signs and the nursing notes.  Pertinent labs & imaging results that were available during my care of the patient were reviewed by me and considered in my medical decision making (see chart for details).  Clinical Course as of 10/26/20 0845  Thu Oct 25, 2020  1219 Chest x-ray interpreted by me as cardiomegaly no gross infiltrates. [MB]  5465 Patient's blood pressure was quite elevated.  She did not take her blood pressure medicine this morning. [MB]  1416 Creatinine(!): 3.04 [MB]    Clinical Course User Index [MB] Hayden Rasmussen, MD   MDM Rules/Calculators/A&P                         This patient complains of upper abdominal pain; this involves an extensive number of treatment Options and is a complaint that carries with it a high risk of complications and Morbidity. The differential includes gastritis, peptic ulcer disease, cholelithiasis, cholecystitis, colitis, ACS  I ordered, reviewed and interpreted labs, which included CBC with normal white count stable hemoglobin, chemistries with elevated creatinine but stable from baseline.  Troponins elevated but flat.  Covid testing negative. I ordered medication IV pain medication, IV PPI I ordered imaging studies which included chest x-ray and CT abdomen and pelvis and I independently    visualized and interpreted imaging which showed cardiomegaly.  CT abdomen  pelvis is pending at time of signout Previous records obtained and reviewed in epic including clinic visit today  After the interventions stated above, I reevaluated the patient and found patient's pain to be improved but still having some pain.  Her care was signed out to oncoming provider Dr. Darl Householder to  follow-up on delta troponin and results from CT abdomen and pelvis.  Disposition per results of testing.   Final Clinical Impression(s) / ED Diagnoses Final diagnoses:  Splenic infarct  Hypertensive urgency    Rx / DC Orders ED Discharge Orders    None       Hayden Rasmussen, MD 10/26/20 [phone]

## 2020-10-26 ENCOUNTER — Inpatient Hospital Stay (HOSPITAL_COMMUNITY): Payer: Medicare HMO

## 2020-10-26 DIAGNOSIS — I361 Nonrheumatic tricuspid (valve) insufficiency: Secondary | ICD-10-CM

## 2020-10-26 DIAGNOSIS — D735 Infarction of spleen: Secondary | ICD-10-CM

## 2020-10-26 DIAGNOSIS — I4891 Unspecified atrial fibrillation: Secondary | ICD-10-CM

## 2020-10-26 LAB — HEPARIN LEVEL (UNFRACTIONATED)
Heparin Unfractionated: 0.14 IU/mL — ABNORMAL LOW (ref 0.30–0.70)
Heparin Unfractionated: 0.26 IU/mL — ABNORMAL LOW (ref 0.30–0.70)

## 2020-10-26 LAB — CBC
HCT: 34.3 % — ABNORMAL LOW (ref 36.0–46.0)
Hemoglobin: 12.3 g/dL (ref 12.0–15.0)
MCH: 30.6 pg (ref 26.0–34.0)
MCHC: 35.9 g/dL (ref 30.0–36.0)
MCV: 85.3 fL (ref 80.0–100.0)
Platelets: 129 10*3/uL — ABNORMAL LOW (ref 150–400)
RBC: 4.02 MIL/uL (ref 3.87–5.11)
RDW: 13.6 % (ref 11.5–15.5)
WBC: 10.2 10*3/uL (ref 4.0–10.5)
nRBC: 0 % (ref 0.0–0.2)

## 2020-10-26 LAB — RAPID URINE DRUG SCREEN, HOSP PERFORMED
Amphetamines: NOT DETECTED
Barbiturates: NOT DETECTED
Benzodiazepines: NOT DETECTED
Cocaine: NOT DETECTED
Opiates: POSITIVE — AB
Tetrahydrocannabinol: NOT DETECTED

## 2020-10-26 LAB — COMPREHENSIVE METABOLIC PANEL
ALT: 7 U/L (ref 0–44)
AST: 15 U/L (ref 15–41)
Albumin: 2.7 g/dL — ABNORMAL LOW (ref 3.5–5.0)
Alkaline Phosphatase: 54 U/L (ref 38–126)
Anion gap: 14 (ref 5–15)
BUN: 26 mg/dL — ABNORMAL HIGH (ref 8–23)
CO2: 19 mmol/L — ABNORMAL LOW (ref 22–32)
Calcium: 9 mg/dL (ref 8.9–10.3)
Chloride: 102 mmol/L (ref 98–111)
Creatinine, Ser: 3.13 mg/dL — ABNORMAL HIGH (ref 0.44–1.00)
GFR, Estimated: 15 mL/min — ABNORMAL LOW (ref >60)
Glucose, Bld: 96 mg/dL (ref 70–99)
Potassium: 3.7 mmol/L (ref 3.5–5.1)
Sodium: 135 mmol/L (ref 135–145)
Total Bilirubin: 1 mg/dL (ref 0.3–1.2)
Total Protein: 6.1 g/dL — ABNORMAL LOW (ref 6.5–8.1)

## 2020-10-26 LAB — APTT: aPTT: 44 seconds — ABNORMAL HIGH (ref 24–36)

## 2020-10-26 LAB — MAGNESIUM: Magnesium: 2.1 mg/dL (ref 1.7–2.4)

## 2020-10-26 MED ORDER — FAMOTIDINE 20 MG PO TABS: 10.0000 mg | ORAL_TABLET | ORAL | Status: DC

## 2020-10-26 MED ORDER — APIXABAN 5 MG PO TABS
10.0000 mg | ORAL_TABLET | Freq: Two times a day (BID) | ORAL | Status: DC
  Administered 2020-10-26 – 2020-10-27 (×2): 10 mg via ORAL
  Filled 2020-10-26 (×3): qty 2

## 2020-10-26 MED ORDER — APIXABAN 5 MG PO TABS: 5.0000 mg | ORAL_TABLET | Freq: Two times a day (BID) | ORAL | Status: DC

## 2020-10-26 NOTE — Progress Notes (Addendum)
Family Medicine Teaching Service Daily Progress Note Intern Pager: [phone]  Patient name: Jennifer Foster Medical record number: [id number] Date of birth: 1945-09-27 Age: 75 y.o. Gender: female  Primary Care Provider: [name], MD Consultants: none Code Status: Full  Pt Overview and Major Events to Date:  2/24: Admitted   Assessment and Plan: Jennifer Foster is a 75 y.o. female presenting with generalized abdominal pain . PMH is significant for HTN, atrial fibrillation, CAD, history of upper GI bleed with peptic ulcer disease, anxiety, depression and hyperlipidemia.  Abdominal Pain  Splenic infarct Noted to have improved abdominal pain this morning, possibly secondary to splenic infarct noted on CT abdomen/pelvis. Other imaging findings include small left pleural effusion with minimal left lung base atelactasis with several wedge-shaped splenic hypodensities most consistent with areas of infarct, cholelithiasis, severe distal colonic diverticulosis without evidence of bowel obstruction. CXR notable for cardiomegaly with lungs clear. Differentials for this include clot secondary to atrial fibrillation or splenic occlusion.  -heparin per pharmacy  -pepcid 10 mg qod given Cr clearance  -blood cultures -f/u echo -monitor clinically for complications such as hemorrhage and possible emergence of infection  -no NSAIDs -monitor for worsening pain and possible fever -consider vascular consult -consider hematology consult   Hypertensive urgency Improved. BP 151/98 this morning. BP on admission 202/87. Given IV hydralazine 10 mg, BP most recently 167/74. History of HTN, home medications include amlodipine 10 mg daily and hydralazine 25 mg tid. -continue hydralazine 25 mg tid -monitor BP, consider restarting home amlodipine if needed  Proteinuria UA notable to have >300 protein. Possibly secondary to hypertensive urgency that caused temporary intrarenal damage.   AKI with CKD stage  4 Stable. Cr 3.13 this morning compared to on admission Cr 3.04 with GFR 16, likely due to intrarenal causes given hypertensive state on admission. Baseline appears to be Cr 2-3.  -monitor BMP -avoid nephrotoxic agents  Atrial Fibrillation  EKG on admission notable for atrial fibrillation. Home medications include eliquis.  -hold home eliquis -heparin per pharmacy   History of cocaine use Denies current use.  -pending UDS -avoid beta blockers except coreg if needed  History of alcohol use CIWAs monitored and have been 0>0.  -monitor for signs of withdrawal clinically  -CIWAs can likely be discontinued   FEN/GI: NPO  PPx: heparin per pharmacy    Status is: Inpatient  Remains inpatient appropriate because:Ongoing diagnostic testing needed not appropriate for outpatient work up   Dispo: The patient is from: Home              Anticipated d/c is to: Home              Patient currently is not medically stable to d/c.   Difficult to place patient No        Subjective:  No acute overnight events. Patient states that she is doing well. Denies chest pain, abdominal pain and dyspnea. Denies any radiating pain. Husband at bedside. Denies any additional concerns at this time.   Objective: Temp:  [98 F (36.7 C)-98.9 F (37.2 C)] 98.9 F (37.2 C) (02/25 0544) Pulse Rate:  [68-136] 79 (02/25 0544) Resp:  [15-30] 15 (02/25 0544) BP: (151-224)/(74-154) 151/96 (02/25 0544) SpO2:  [95 %-99 %] 98 % (02/25 0544) Weight:  [54.3 kg-57.2 kg] 54.3 kg (02/25 0544) Physical Exam: General: Patient laying comfortably in bed, in no acute distress. Cardiovascular: irregularly irregular rhythm, normal rate, no murmurs auscultated Respiratory: CTAB, normal WOB, no crackles or wheezing noted  Abdomen: soft, mild LUQ tenderness on deep palpation, presence of active bowel sounds, no evidence of splenomegaly  Extremities: radial and distal pulses strong and equal bilaterally, no LE edema noted  bilaterally   Laboratory: Recent Labs  Lab 10/23/20 1818 10/25/20 1150 10/26/20 0137  WBC 5.8 10.2 10.2  HGB 10.2* 12.7 12.3  HCT 30.1* 35.4* 34.3*  PLT 155 160 129*   Recent Labs  Lab 10/23/20 1818 10/25/20 1150 10/26/20 0137  NA 136 138 135  K 4.2 4.0 3.7  CL 103 104 102  CO2 23 23 19*  BUN 27* 23 26*  CREATININE 3.31* 3.04* 3.13*  CALCIUM 9.0 9.8 9.0  PROT 5.8* 6.9 6.1*  BILITOT 0.8 0.9 1.0  ALKPHOS 54 64 54  ALT 12 11 7   AST 21 21 15   GLUCOSE 133* 109* 96      Imaging/Diagnostic Tests: CT Abdomen Pelvis Wo Contrast  Result Date: 10/25/2020 CLINICAL DATA:  75 year old female with acute abdominal pain. EXAM: CT CHEST, ABDOMEN AND PELVIS WITHOUT CONTRAST TECHNIQUE: Multidetector CT imaging of the chest, abdomen and pelvis was performed following the standard protocol without IV contrast. COMPARISON:  Chest radiograph dated 10/25/2020 and CT abdomen pelvis dated 09/04/2019 and CT dated 04/03/2017. FINDINGS: Evaluation of this exam is limited in the absence of intravenous contrast. CT CHEST FINDINGS Cardiovascular: There is mild cardiomegaly. Advanced 3 vessel coronary vascular calcification. Small pericardial effusion measuring 9 mm in thickness anterior to the heart. There is advanced atherosclerotic calcification of the thoracic aorta. The central pulmonary arteries are unremarkable. Mediastinum/Nodes: No hilar or mediastinal adenopathy. The esophagus is grossly unremarkable. No mediastinal fluid collection. Enlarged nodular thyroid gland may represent multinodular goiter. This has been evaluated on previous imaging. (ref: J Am Coll Radiol. 2015 Feb;12(2): 143-50).No mediastinal fluid collection. Lungs/Pleura: Small left pleural effusion with minimal left lung base atelectasis. There is background of mild centrilobular emphysema. No pneumothorax. The central airways are patent. Musculoskeletal: No acute osseous pathology. CT ABDOMEN PELVIS FINDINGS No intra-abdominal free air.   Small free fluid in the pelvis. Hepatobiliary: The liver is grossly unremarkable. Small gallstone. No definite pericholecystic fluid. Pancreas: Unremarkable. No pancreatic ductal dilatation or surrounding inflammatory changes. Spleen: Several wedge-shaped splenic hypodensities most consistent with areas of infarct. Clinical correlation is recommended. No perisplenic fluid collection. Adrenals/Urinary Tract: Mild thickened appearance of the adrenal glands. Vascular calcifications versus less likely nonobstructing bilateral renal calculi. There is no hydronephrosis or obstructing stone. Bilateral renal cortical irregularity. Several small ill-defined bilateral renal hypodense lesions are not characterized on this noncontrast CT. The visualized ureters and urinary bladder appear unremarkable. Stomach/Bowel: There is severe distal colonic diverticulosis without active inflammatory change. There is distal duodenal diverticula measuring up to 4 cm. There is no bowel obstruction or active inflammation. The appendix is not visualized with certainty. No inflammatory changes identified in the right lower quadrant. Vascular/Lymphatic: Advanced aortoiliac atherosclerotic disease. The aorta is ectatic. The IVC is unremarkable. No portal venous gas. There is no adenopathy. Reproductive: Hysterectomy. Similar appearance of the right ovary with 2.7 cm hypodense lesion as seen on the prior CT, likely a benign or indolent process. Other: None Musculoskeletal: Degenerative changes the spine. No acute osseous pathology. IMPRESSION: 1. Small left pleural effusion with minimal left lung base atelectasis. 2. Several wedge-shaped splenic hypodensities most consistent with areas of infarct. Clinical correlation is recommended. 3. Cholelithiasis. 4. Severe distal colonic diverticulosis. No bowel obstruction. 5. Aortic Atherosclerosis (ICD10-I70.0) and Emphysema (ICD10-J43.9). Electronically Signed   By: Laren Everts.D.  On:  10/25/2020 16:42   CT Chest Wo Contrast  Result Date: 10/25/2020 CLINICAL DATA:  75 year old female with acute abdominal pain. EXAM: CT CHEST, ABDOMEN AND PELVIS WITHOUT CONTRAST TECHNIQUE: Multidetector CT imaging of the chest, abdomen and pelvis was performed following the standard protocol without IV contrast. COMPARISON:  Chest radiograph dated 10/25/2020 and CT abdomen pelvis dated 09/04/2019 and CT dated 04/03/2017. FINDINGS: Evaluation of this exam is limited in the absence of intravenous contrast. CT CHEST FINDINGS Cardiovascular: There is mild cardiomegaly. Advanced 3 vessel coronary vascular calcification. Small pericardial effusion measuring 9 mm in thickness anterior to the heart. There is advanced atherosclerotic calcification of the thoracic aorta. The central pulmonary arteries are unremarkable. Mediastinum/Nodes: No hilar or mediastinal adenopathy. The esophagus is grossly unremarkable. No mediastinal fluid collection. Enlarged nodular thyroid gland may represent multinodular goiter. This has been evaluated on previous imaging. (ref: J Am Coll Radiol. 2015 Feb;12(2): 143-50).No mediastinal fluid collection. Lungs/Pleura: Small left pleural effusion with minimal left lung base atelectasis. There is background of mild centrilobular emphysema. No pneumothorax. The central airways are patent. Musculoskeletal: No acute osseous pathology. CT ABDOMEN PELVIS FINDINGS No intra-abdominal free air.  Small free fluid in the pelvis. Hepatobiliary: The liver is grossly unremarkable. Small gallstone. No definite pericholecystic fluid. Pancreas: Unremarkable. No pancreatic ductal dilatation or surrounding inflammatory changes. Spleen: Several wedge-shaped splenic hypodensities most consistent with areas of infarct. Clinical correlation is recommended. No perisplenic fluid collection. Adrenals/Urinary Tract: Mild thickened appearance of the adrenal glands. Vascular calcifications versus less likely nonobstructing  bilateral renal calculi. There is no hydronephrosis or obstructing stone. Bilateral renal cortical irregularity. Several small ill-defined bilateral renal hypodense lesions are not characterized on this noncontrast CT. The visualized ureters and urinary bladder appear unremarkable. Stomach/Bowel: There is severe distal colonic diverticulosis without active inflammatory change. There is distal duodenal diverticula measuring up to 4 cm. There is no bowel obstruction or active inflammation. The appendix is not visualized with certainty. No inflammatory changes identified in the right lower quadrant. Vascular/Lymphatic: Advanced aortoiliac atherosclerotic disease. The aorta is ectatic. The IVC is unremarkable. No portal venous gas. There is no adenopathy. Reproductive: Hysterectomy. Similar appearance of the right ovary with 2.7 cm hypodense lesion as seen on the prior CT, likely a benign or indolent process. Other: None Musculoskeletal: Degenerative changes the spine. No acute osseous pathology. IMPRESSION: 1. Small left pleural effusion with minimal left lung base atelectasis. 2. Several wedge-shaped splenic hypodensities most consistent with areas of infarct. Clinical correlation is recommended. 3. Cholelithiasis. 4. Severe distal colonic diverticulosis. No bowel obstruction. 5. Aortic Atherosclerosis (ICD10-I70.0) and Emphysema (ICD10-J43.9). Electronically Signed   By: Anner Crete M.D.   On: 10/25/2020 16:42   DG Chest Port 1 View  Result Date: 10/25/2020 CLINICAL DATA:  Pain with shortness of breath and cough EXAM: PORTABLE CHEST 1 VIEW COMPARISON:  December 25, 2019 FINDINGS: Lungs are clear. There is cardiomegaly with pulmonary vascularity normal. No adenopathy. There is aortic atherosclerosis. No bone lesions. IMPRESSION: Cardiomegaly.  Lungs clear. Aortic Atherosclerosis (ICD10-I70.0). Electronically Signed   By: Lowella Grip III M.D.   On: 10/25/2020 12:34    Donney Dice, DO 10/26/2020, 7:38  AM PGY-1, [place] Intern pager: [phone], text pages welcome

## 2020-10-26 NOTE — TOC CAGE-AID Note (Signed)
Transition of Care [facility]) - CAGE-AID Screening   Patient Details  Name: Jennifer Foster MRN: [id number] Date of Birth: August 30, 1946  Transition of Care [facility]) CM/SW Contact:    Bethena Roys, RN Phone Number: 10/26/2020, 3:46 PM   Clinical Narrative: Case Manager received a referral for outpatient substance abuse resources. Case Manager spoke with patient regarding ETOH Abuse and substance abuse resources were provided.   CAGE-AID Screening:    Have You Ever Felt You Ought to Cut Down on Your Drinking or Drug Use?: Yes (drinks beer every other day. Patient was drinking daily.) Have People Annoyed You By Critizing Your Drinking Or Drug Use?: No Have You Felt Bad Or Guilty About Your Drinking Or Drug Use?: Yes Have You Ever Had a Drink or Used Drugs First Thing In The Morning to Steady Your Nerves or to Get Rid of a Hangover?: No CAGE-AID Score: 2  Substance Abuse Education Offered: Yes  Substance abuse interventions: Scientist, clinical (histocompatibility and immunogenetics)

## 2020-10-26 NOTE — Progress Notes (Signed)
Dr. Marin Olp, with oncology, was curbsided regarding this case.  After brief discussion of the case, he provided the following recommendations: -No significant oncological work-up is necessary in this situation of splenic infarct -Most likely secondary to insufficient anticoagulation for A. fib -Increased to full dose apixaban 5 mg twice daily  He reported that he would be happy to consult formally on the case of there any additional questions or new concerns. Matilde Haymaker, MD

## 2020-10-26 NOTE — Progress Notes (Addendum)
Received page at 5:45pm from nurse that patient was agitated and wanting to leave because she wanted to attend a friend's funeral.   I visited her room and found her sitting on the edge of the bed in a hospital gown with her pants draped over her knees, ready to be put on.  When I asked what was going on, she reported that she had just now found out that a childhood friend had died and his wake was going to be this evening.  She reported wanting to leave to attend the wake.  She found out maybe 10 or 20 minutes before the nurse paged me that the wake was 7 PM tonight. She was avoiding eye contact by staring at the television and wiping her eyes frequently.  I sat down at the bedside and explained to her that she may certainly leave, but I explained the testing we are continuing to do with the echo.  I relayed that we had her on a super therapeutic dose of blood thinner to treat the likely clot in her spleen, but that we do not yet know if there was a large clot in her heart.  I relayed that if she left tonight, and happened to need an intervention based on we found the echo, the intervention may be delayed by readmission.  She appeared deflated and disappointed, but said that she would stay based on this information.  She also said that her husband probably could not even get to the hospital in time to give her a ride to the wake.  She declined the hospital chaplain.  Her only request was that I pray for her.  With her permission, we prayed together in the room.  She reported feeling much better after that and thanked me.  Ezequiel Essex, MD

## 2020-10-26 NOTE — Progress Notes (Addendum)
Addendum: splenic infarct felt likely secondary to atrial fibrillation, will transition heparin to VTE treatment dose of apixaban 10mg  BID x7d, then continue 5mg  BID.   [place] for heparin Indication: splenic infarct, hx of atrial fibrillation  Labs: Recent Labs    10/23/20 1818 10/25/20 1150 10/25/20 1551 10/25/20 2151 10/26/20 0137 10/26/20 1048  HGB 10.2* 12.7  --   --  12.3  --   HCT 30.1* 35.4*  --   --  34.3*  --   PLT 155 160  --   --  129*  --   APTT  --   --   --   --  44*  --   LABPROT  --   --   --  13.7  --   --   INR  --   --   --  1.1  --   --   HEPARINUNFRC  --   --   --   --  0.14* 0.26*  CREATININE 3.31* 3.04*  --   --  3.13*  --   TROPONINIHS  --  53* 48*  --   --   --    Assessment: 76 yo admitted for epigastric pain.  On Eliquis PTA for atrial fibrillation and splenic infarct.  Last Eliquis dose 2/23 PM.  Pharmacy consulted to dose heparin.  Will keep heparin for now in case GI procedure needed.   Follow-up heparin level slightly subtherapeutic at 0.26. Hgb stable, PLT down today to 129 from 160 prior. No problems with gtt or bleeding per RN.   Goal of Therapy:  Heparin level 0.3-0.7 units/ml Monitor platelets by anticoagulation protocol: Yes   Plan:  Increase heparin drip to 1,150 units/hr F/u 8 hour HL Monitor CBC, s/sx bleeding F/u ability to transition back to [place], PharmD PGY1 Acute Care Pharmacy Resident Please refer to [facility] for unit-specific pharmacist

## 2020-10-26 NOTE — Progress Notes (Signed)
  Echocardiogram 2D Echocardiogram has been performed.  Jennifer Foster 10/26/2020, 5:01 PM

## 2020-10-26 NOTE — Progress Notes (Signed)
[place] for heparin Indication: splenic infarct, hx of atrial fibrillation  Labs: Recent Labs    10/23/20 1818 10/25/20 1150 10/25/20 1551 10/25/20 2151 10/26/20 0137  HGB 10.2* 12.7  --   --  12.3  HCT 30.1* 35.4*  --   --  34.3*  PLT 155 160  --   --  129*  APTT  --   --   --   --  44*  LABPROT  --   --   --  13.7  --   INR  --   --   --  1.1  --   HEPARINUNFRC  --   --   --   --  0.14*  CREATININE 3.31* 3.04*  --   --  3.13*  TROPONINIHS  --  53* 48*  --   --    Assessment: 75 yo admitted for epigastric pain.  On Eliquis PTA for atrial fibrillation and splenic infarct.  Last Eliquis dose 2/23 PM.  Pharmacy consulted to dose heparin.  Will keep heparin for now in case GI procedure needed.  Initial heparin level 0.14 units/ml, aPTT 44 sec  PTLC 129  Goal of Therapy:  Heparin level 0.3-0.7 units/ml Monitor platelets by anticoagulation protocol: Yes   Plan:  Increase heparin drip to 1050 units/hr F/u 8 hour HL Monitor CBC, s/sx bleeding F/u ability to transition back to Eliquis  Jerauld Bostwick, [employer] 10/26/2020,2:59 AM

## 2020-10-27 DIAGNOSIS — I160 Hypertensive urgency: Secondary | ICD-10-CM | POA: Diagnosis not present

## 2020-10-27 DIAGNOSIS — D735 Infarction of spleen: Secondary | ICD-10-CM | POA: Diagnosis not present

## 2020-10-27 LAB — ECHOCARDIOGRAM COMPLETE
AR max vel: 2.27 cm2
AV Area VTI: 2.25 cm2
AV Area mean vel: 2.09 cm2
AV Mean grad: 4 mmHg
AV Peak grad: 7.7 mmHg
Ao pk vel: 1.39 m/s
Height: 64 in
S' Lateral: 3 cm
Weight: 1913.6 oz

## 2020-10-27 MED ORDER — APIXABAN 5 MG PO TABS: 5.0000 mg | ORAL_TABLET | Freq: Two times a day (BID) | ORAL | 0 refills | Status: DC

## 2020-10-27 MED ORDER — APIXABAN 5 MG PO TABS: 10.0000 mg | ORAL_TABLET | Freq: Two times a day (BID) | ORAL | 0 refills | Status: DC

## 2020-10-27 NOTE — Progress Notes (Addendum)
Received pt confused and agitated.  A/O x time and person,  disoriented to place and situation.  She is asking to go home and see her husband. Had her call her husband. Family medicine made aware.  Idolina Primer, RN

## 2020-10-27 NOTE — Discharge Instructions (Signed)
Dear [name],   Thank you so much for allowing [place] to be part of your care!  You were admitted to [facility] for your abdominal pain and you were found to have a splenic infarct. We are glad you are feeling better!    POST-HOSPITAL & CARE INSTRUCTIONS 1. Continue the medication Apixaban 10mg  for 6 more days (12 tablets total) followed by 5mg  twice daily  2. Please call to schedule a follow up appointment with your PCP: [phone] 3. Please let PCP/Specialists know of any changes that were made.  4. Please see medications section of this packet for any medication changes.   DOCTOR'S APPOINTMENT & FOLLOW UP CARE INSTRUCTIONS  Future Appointments  Date Time Provider [place]  11/01/2020  1:00 PM [facility]-CT 2 [facility]-CT [place]  11/02/2020  3:00 PM [facility]-CASE MANAGER [facility] [place]  11/09/2020  9:20 AM Nahser, Wonda Cheng, MD [facility]    RETURN PRECAUTIONS: Return if you develop worsening abdominal pain, difficulty breathing, develop chest pain or other new and concerning symptoms.  Take care and be well!  [facility]  [place], [place] [id number] [phone]

## 2020-10-27 NOTE — Discharge Summary (Addendum)
[facility] Discharge Summary  Patient name: Jennifer Foster Medical record number: [id number] Date of birth: Jul 12, 1946 Age: 75 y.o. Gender: female Date of Admission: 10/25/2020  Date of Discharge: 10/27/20 Admitting Physician: Donney Dice, DO  Primary Care Provider: [name], MD Consultants: none  Indication for Hospitalization: Splenic infarct and hypertensive urgency   Discharge Diagnoses/Problem List:  Splenic infarct Hypertensive urgency  Atrial fibrillation   Disposition: Home  Discharge Condition: stable   Discharge Exam:  Temp:  [98 F (36.7 C)-98.7 F (37.1 C)] 98.3 F (36.8 C) (02/26 0355) Pulse Rate:  [77-92] 92 (02/26 0355) Resp:  [14-18] 15 (02/26 0355) BP: (140-168)/(68-80) 162/68 (02/26 0355) SpO2:  [99 %-100 %] 100 % (02/26 0355) Weight:  [53.4 kg] 53.4 kg (02/26 0355)  General: Patient laying in bed, NAD Cardiovascular: Irregularly irregular rhythm, normal rate, no murmurs Respiratory: CTAB. Normal WOB Abdomen: soft, non distended, non tender  Extremities: no LE edema, distal pulses 2+ bilaterally   Brief Hospital Course:   Jennifer Foster is a 75 y.o. female presenting with generalized abdominal pain . PMH is significant for HTN, atrial fibrillation, CAD, history of upper GI bleed with peptic ulcer disease, anxiety, depression and hyperlipidemia.   Abdominal Pain Patient presents with abdominal pain that has persisted for 2 days. Seen by [facility] and noted to have significant amount of abdominal pain in the clinic and recommended to have further evaluation in the ED. CXR notable for cardiomegaly with lungs clear. CT chest and  abdomen/pelvis noted to small left pleural effusion with minimal left lung base atelactasis with several wedge-shaped splenic hypodensities most consistent with areas of infarct, cholelithiasis, severe distal colonic diverticulosis without evidence of bowel obstruction. In the ED, patient taken off  eliquis and placed on heparin. Given morphine 4 mg, for pain, and also received protonix and zofran. Splenic infarct likely due to insufficient anticoagulation for a fib. Patient was increased to full dose apixaban: 10mg  twice daily for 7 days, followed by 5mg  twice daily. By time of discharge patient was stable with improvement in her abdominal pain  Hypertensive urgency  BP on admission 202/87 on admission. She was continued on her home hydralazine 25 mg TID with improvement in her blood pressures prior to discharge.   Atrial Fibrillation  EKG on admission notable for atrial fibrillation. Her home medication eliquis was held and she was started on heparin. By time of discharge patient was placed of full dose apixaban per above.   Issues for Follow Up:  1. Medication change: Continue apixaban 10mg  twice daily for 6 more days (12 tablets total) followed by 5mg  twice daily  2. Follow up with PCP in 1 week: Check on abdominal pain as well as breathing status. Echo showed small pericardial effusion at time of discharge   Significant Procedures: none   Significant Labs and Imaging:  Recent Labs  Lab 10/23/20 1818 10/25/20 1150 10/26/20 0137  WBC 5.8 10.2 10.2  HGB 10.2* 12.7 12.3  HCT 30.1* 35.4* 34.3*  PLT 155 160 129*   Recent Labs  Lab 10/23/20 1818 10/25/20 1150 10/26/20 0137 10/26/20 0920  NA 136 138 135  --   K 4.2 4.0 3.7  --   CL 103 104 102  --   CO2 23 23 19*  --   GLUCOSE 133* 109* 96  --   BUN 27* 23 26*  --   CREATININE 3.31* 3.04* 3.13*  --   CALCIUM 9.0 9.8 9.0  --  MG  --   --   --  2.1  ALKPHOS 54 64 54  --   AST 21 21 15   --   ALT 12 11 7   --   ALBUMIN 2.9* 3.1* 2.7*  --     Results/Tests Pending at Time of Discharge: None   Discharge Medications:  Allergies as of 10/27/2020      Reactions   Ace Inhibitors Swelling, Other (See Comments)   Eyes swell   Lisinopril Swelling, Other (See Comments)   Swollen tongue    Tramadol Itching      Medication  List    STOP taking these medications   Baclofen 5 MG Tabs   traMADol 50 MG tablet Commonly known as: ULTRAM     TAKE these medications   albuterol 108 (90 Base) MCG/ACT inhaler Commonly known as: VENTOLIN HFA Inhale 2 puffs into the lungs every 6 (six) hours as needed for wheezing or shortness of breath.   amLODipine 10 MG tablet Commonly known as: NORVASC TAKE 1 TABLET(10 MG) BY MOUTH DAILY What changed: See the new instructions.   apixaban 5 MG Tabs tablet Commonly known as: ELIQUIS Take 2 tablets (10 mg total) by mouth 2 (two) times daily. What changed:   medication strength  how much to take   apixaban 5 MG Tabs tablet Commonly known as: ELIQUIS Take 1 tablet (5 mg total) by mouth 2 (two) times daily. Start taking on: November 02, 2020 What changed: You were already taking a medication with the same name, and this prescription was added. Make sure you understand how and when to take each.   calcitRIOL 0.25 MCG capsule Commonly known as: ROCALTROL Take 0.25 mcg by mouth daily.   diclofenac Sodium 1 % Gel Commonly known as: Voltaren Apply 4 g topically 4 (four) times daily. Apply to hip joint   escitalopram 10 MG tablet Commonly known as: LEXAPRO TAKE 1 TABLET(10 MG) BY MOUTH DAILY What changed: See the new instructions.   famotidine 20 MG tablet Commonly known as: Pepcid Take 1 tablet (20 mg total) by mouth daily. What changed:   when to take this  reasons to take this   furosemide 20 MG tablet Commonly known as: LASIX Take 1 tablet (20 mg total) by mouth daily.   hydrALAZINE 25 MG tablet Commonly known as: APRESOLINE Take 1 tablet (25 mg total) by mouth 3 (three) times daily.   isosorbide mononitrate 30 MG 24 hr tablet Commonly known as: IMDUR TAKE 1 TABLET(30 MG) BY MOUTH DAILY What changed: See the new instructions.   mirtazapine 15 MG tablet Commonly known as: Remeron Take 1 tablet (15 mg total) by mouth at bedtime.   nitroGLYCERIN 0.4 MG  SL tablet Commonly known as: NITROSTAT Place 1 tablet (0.4 mg total) under the tongue every 5 (five) minutes as needed for chest pain. If do not resolve after 1 tablet, go to ED   potassium chloride SA 20 MEQ tablet Commonly known as: KLOR-CON TAKE HALF TABLET BY MOUTH EVERY DAY What changed: See the new instructions.   rosuvastatin 20 MG tablet Commonly known as: CRESTOR Take 1 tablet (20 mg total) by mouth at bedtime.   Zoster Vaccine Adjuvanted injection Commonly known as: SHINGRIX Administer second dose after 2 months after the initial dose       Discharge Instructions: Please refer to Patient Instructions section of EMR for full details.  Patient was counseled important signs and symptoms that should prompt return to medical care, changes in  medications, dietary instructions, activity restrictions, and follow up appointments.   Follow-Up Appointments:  Follow-up Information    [name], MD .   Specialty: [facility] Contact information: [place] [address] [phone]        Nahser, Wonda Cheng, MD .   Specialty: Cardiology Contact information: [place] [address] [phone]               Shary Key, DO 10/27/2020, 8:12 AM PGY-1, [place] Upper-Level Resident Addendum   I have independently interviewed and examined the patient. I have discussed the above with the original author and agree with their documentation. Please see also any attending notes.    Carollee Leitz MD PGY-2, [facility] 10/27/2020 7:50 PM  FPTS Service pager: [phone] (text pages welcome through [place])

## 2020-10-27 NOTE — Plan of Care (Signed)

## 2020-10-27 NOTE — Progress Notes (Signed)
FPTS Interim Progress Note  S: Received page from nurse regarding patient experiencing confusion and agitation, while apparently only oriented to person and time. Went to bedside to assess patient. She is answering all questions appropriately. States that she feels fine and denies any abdominal pain or pain elsewhere. She states that she got a lot of sleep last night and just woke up a few minutes ago and momentarily forgot that she had not been discharged home yet and thought to herself that she must be in the hospital. Reports that she had a dream about kids helping people out but endorses that it must have been a dream because she did not see any here. She denies any history of falls or head trauma.  O: BP (!) 162/68 (BP Location: Right Arm)   Pulse 92   Temp 98.3 F (36.8 C) (Oral)   Resp 15   Ht 5\' 4"  (1.626 m)   Wt 53.4 kg   SpO2 100%   BMI 20.20 kg/m   General: Patient laying comfortably in bed, in no acute distress. CV: irregularly irregular rate Resp: CTAB Abdomen: soft, nontender upon deep palpation, no evidence of splenomegaly, presence of active bowel sounds Neuro: AOx4, answers all questions appropriately and follow all commands Psych: mood appropriate, no agitation noted, denies both visual and auditory hallucinations  A/P: -no concern for altered mental status, if concern can consider CT head w/o contrast -hopeful discharge today  Donney Dice, DO 10/27/2020, 8:01 AM PGY-1, [place] Service pager [phone]

## 2020-10-27 NOTE — Progress Notes (Signed)
Was called by RN for concern of patient being confused. Went to examine patient and she was A&O x4. She stated she took the monitors off of her so she can use the restroom. Denies any current abdominal pain. No complaints at this time.

## 2020-10-29 ENCOUNTER — Telehealth: Payer: Self-pay | Admitting: Family Medicine

## 2020-10-29 NOTE — Telephone Encounter (Signed)
The patient was recently hospitalized and had a CT lung done, which did not show pulmonary nodules. She is scheduled for a CT chest in 2 days for a Lung cancer screen and to monitor her aortic aneurysm.  I discussed with Dr. Pascal Lux regarding canceling this CT scan to obtain an MRA instead for TAA monitoring.   He reviewed her CT scan from 10/25/20 - Neg pulm nodules. 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  He recommended to stop monitoring her TAA and repeat CT lung cancer screen in a year. I will discuss the plan with the patient tomorrow and cancel her CT lungs appointment. I appreciate the support from radiology.

## 2020-10-29 NOTE — Telephone Encounter (Signed)
CT has been canceled. Salvatore Marvel, CMA

## 2020-10-30 ENCOUNTER — Inpatient Hospital Stay: Payer: Medicare HMO | Admitting: Family Medicine

## 2020-10-31 ENCOUNTER — Ambulatory Visit: Payer: Medicare HMO

## 2020-10-31 LAB — CULTURE, BLOOD (ROUTINE X 2)
Culture: NO GROWTH
Culture: NO GROWTH
Special Requests: ADEQUATE
Special Requests: ADEQUATE

## 2020-10-31 NOTE — Chronic Care Management (AMB) (Unsigned)
   10/31/2020  Jennifer Foster [birth year] [id number]  EMMI- Red on EMMI Alert: Day # 1 Date: 10/29/20 Red Alert Reason:  Got discharge papers? I Don't Know  Know who to call about changes in condition? No  Scheduled follow-up? No     Outreach attempt # 1  to patient.   Clinical Interventions  Explained to the patient the nature of the call. She states that when she received the automated call she stated "I was not thinking correctly".  She has her discharge papers but had not fully looked at them.  She did not know that she had an appointment with Dr [name] on 10/30/20.  Patient condition was assessed-patient states that she ws a little weak and she had a little appetite but has been able to eat some chicken noodle soup.  She is staying hydrated. Patient denies any chest pain shortness of breath swelling   Evaluation of current treatment plan related to discharge summary and patient's adherence to plan as established by provider.  Reviewed medications with patient and discussed changes to her eliquis. Told patient to stop Baclofen and Tramadol per AVS. Told the patient that she had new prescriptions at the pharmacy and to have her husband to pickthem up today. Also advised her to take all of her prescriptions to the appointment for the doctor to review with her so there will be no confusion.  Reviewed scheduled/upcoming provider appointments including: RNCM scheduled the patient an appointment with Dr Higinio Plan on 11/02/20 for Hospital F/U at 8:30 am, Dr Cathie Olden on the 11-09-20 patient verbalized understanding.   Advised patient, providing education and rationale, to weigh daily and record, calling the office  for weight gain of 3lbs overnight or 5 pounds in a week.   Discussed plans with patient for ongoing care management follow up and provided patient with direct contact information for care management team  Reviewed HF action plan.  Explained if Jennifer Foster receives an automated call  again and the response to any of the questions are abnormal Jennifer Foster may get another call. Patient states that the call was appreciated.    Lazaro Arms RN, BSN, [facility] Care Management Coordinator [place] Phone: [phone] Fax: [phone]

## 2020-11-01 ENCOUNTER — Ambulatory Visit (HOSPITAL_COMMUNITY): Payer: Medicare HMO

## 2020-11-02 ENCOUNTER — Telehealth: Payer: Medicare HMO

## 2020-11-02 ENCOUNTER — Ambulatory Visit (INDEPENDENT_AMBULATORY_CARE_PROVIDER_SITE_OTHER): Payer: Medicare HMO | Admitting: Family Medicine

## 2020-11-02 ENCOUNTER — Other Ambulatory Visit: Payer: Self-pay

## 2020-11-02 VITALS — BP 136/58 | HR 74 | Wt 118.8 lb

## 2020-11-02 DIAGNOSIS — D735 Infarction of spleen: Secondary | ICD-10-CM

## 2020-11-02 DIAGNOSIS — I4821 Permanent atrial fibrillation: Secondary | ICD-10-CM | POA: Diagnosis not present

## 2020-11-02 DIAGNOSIS — I10 Essential (primary) hypertension: Secondary | ICD-10-CM | POA: Diagnosis not present

## 2020-11-02 DIAGNOSIS — R931 Abnormal findings on diagnostic imaging of heart and coronary circulation: Secondary | ICD-10-CM | POA: Diagnosis not present

## 2020-11-02 NOTE — Assessment & Plan Note (Addendum)
Through chart review noted echocardiogram during hospitalization showing left left ventricular regional wall abnormalities and small pericardial effusion without tamponade physiology, not present in echo 03/2020.  Overall findings per echo report consistent with hypertrophic cardiomyopathy and to consider amyloid.  Fortunately breathing comfortably without any concerning s/sx at this time.  Stressed importance of follow-up with her cardiologist already scheduled on 3/11.

## 2020-11-02 NOTE — Patient Instructions (Signed)
It was very wonderful to see you today.  Please continue your Eliquis 5 mg twice daily.  It is very important that you make sure you make your appointment your cardiologist on the 11th.  If you have any recurrence of your abdominal pain, chest pain, shortness of breath, or increased confusion please make sure you follow-up or go to the ED.

## 2020-11-02 NOTE — Assessment & Plan Note (Addendum)
Rate appropriate.  Continue Eliquis 5 mg twice daily.

## 2020-11-02 NOTE — Progress Notes (Signed)
    SUBJECTIVE:   CHIEF COMPLAINT / HPI: Hospital follow-up  Syriah is a 75 year old female presenting for hospital follow-up after hypertensive urgency and splenic infarct.  She was admitted from 2/24-2/26.  She initially presented with generalized abdominal pain, however most localized in the left upper quadrant.  CT chest/abdomen/pelvis showed several wedge-shaped splenic hypodensities consistent with infarct.  Felt to be possibly due to insufficient anticoagulation for her known atrial fibrillation after brief discussion with heme/onc who did not recommend any hematology work-up at this time. Discharge home with Eliquis 10 mg twice daily for the next 7 days followed by 5 mg twice daily thereafter.  Today, she reports she has no further abdominal pain.  She did not realize that she was was to take 10 mg twice daily and has been only taking the 5 mg twice daily.  She also reports compliance with her hydralazine, Imdur, and Norvasc.  She denies any shortness of breath, chest pain, palpitations, leg swelling, orthopnea.  PERTINENT  PMH / PSH: Hypertension, atrial fibrillation, CAD, anxiety, depression, hyperlipidemia  OBJECTIVE:   BP (!) 136/58   Pulse 74   Wt 118 lb 12.8 oz (53.9 kg)   SpO2 99%   BMI 20.39 kg/m   General: Alert, NAD HEENT: NCAT, MMM Cardiac: Irregularly irregular rate and rhythm, faint 1/6 systolic murmur heard Lungs: Clear bilaterally, no increased WOB  Abdomen: soft, non-tender throughout all quadrants especially in left upper quadrant with deep palpation, non-distended, normoactive BS Msk: Moves all extremities spontaneously  Ext: Warm, dry, 2+ distal pulses  ASSESSMENT/PLAN:   Splenic infarct Evident on CT abdomen 2/26.  Abdominal pain has since resolved.  Felt to be possibly secondary to insufficient anticoagulation with her atrial fibrillation.  Unfortunately, she did not proceed with the 10 mg BID Eliquis dosing for 1 week, however has been taking her  Eliquis 5 twice daily consistently.  Will continue with this.  Abnormal echocardiogram Through chart review noted echocardiogram during hospitalization showing left left ventricular regional wall abnormalities and small pericardial effusion without tamponade physiology, not present in echo 03/2020.  Overall findings per echo report consistent with hypertrophic cardiomyopathy and to consider amyloid.  Fortunately breathing comfortably without any concerning s/sx at this time.  Stressed importance of follow-up with her cardiologist already scheduled on 3/11.  Essential hypertension Reasonable control today, appears to have lower diastolic so will keep as is.  Continue Norvasc, hydralazine, and Imdur as is.  Atrial fibrillation (HCC) Rate appropriate.  Continue Eliquis 5 mg twice daily.    ED precautions discussed.  Follow-up with cardiology on 3/11, afterwards encouraged regular follow-up with her PCP Dr. [name] in the next 1-2 months or sooner if needed.  Patriciaann Clan, [place]

## 2020-11-02 NOTE — Assessment & Plan Note (Signed)
Reasonable control today, appears to have lower diastolic so will keep as is.  Continue Norvasc, hydralazine, and Imdur as is.

## 2020-11-02 NOTE — Assessment & Plan Note (Signed)
Evident on CT abdomen 2/26.  Abdominal pain has since resolved.  Felt to be possibly secondary to insufficient anticoagulation with her atrial fibrillation.  Unfortunately, she did not proceed with the 10 mg BID Eliquis dosing for 1 week, however has been taking her Eliquis 5 twice daily consistently.  Will continue with this.

## 2020-11-08 ENCOUNTER — Ambulatory Visit: Payer: Medicare HMO

## 2020-11-08 ENCOUNTER — Encounter: Payer: Self-pay | Admitting: Cardiovascular Disease

## 2020-11-08 NOTE — Progress Notes (Signed)
No show  This encounter was created in error - please disregard.

## 2020-11-08 NOTE — Chronic Care Management (AMB) (Signed)
Care Management    RN Visit Note  11/08/2020 Name: Jennifer Foster MRN: [id number] DOB: 1946/05/30  Subjective: Jennifer Foster is a 75 y.o. year old female who is a primary care patient of Jennifer Feil, MD. The care management team was consulted for assistance with disease management and care coordination needs.    Engaged with patient by telephone for follow up visit in response to provider referral for case management and/or care coordination services.   Consent to Services:   Jennifer Foster was given information about Care Management services today including:  1. Care Management services includes personalized support from designated clinical staff supervised by her physician, including individualized plan of care and coordination with other care providers 2. 24/7 contact phone numbers for assistance for urgent and routine care needs. 3. The patient may stop case management services at any time by phone call to the office staff.  Patient agreed to services and consent obtained.     Assessment: Patient continues to experience difficulty with not checking her BP and weight.  She reports that she is feeling betterand stronger... See Care Plan below for interventions and patient self-care actives. Follow up Plan: Patient would like continued follow-up.  CCM RNCM will outreach the patient within the next 14 days.. Patient will call office if needed prior to next encounter  Review of patient past medical history, allergies, medications, health status, including review of consultants reports, laboratory and other test data, was performed as part of comprehensive evaluation and provision of chronic care management services.   SDOH (Social Determinants of Health) assessments and interventions performed:    Care Plan  Allergies  Allergen Reactions  . Ace Inhibitors Swelling and Other (See Comments)    Eyes swell  . Lisinopril Swelling and Other (See Comments)    Swollen tongue   . Tramadol  Itching    Outpatient Encounter Medications as of 11/08/2020  Medication Sig Note  . albuterol (VENTOLIN HFA) 108 (90 Base) MCG/ACT inhaler Inhale 2 puffs into the lungs every 6 (six) hours as needed for wheezing or shortness of breath.   Jennifer Foster amLODipine (NORVASC) 10 MG tablet TAKE 1 TABLET(10 MG) BY MOUTH DAILY (Patient taking differently: Take 10 mg by mouth daily.)   . apixaban (ELIQUIS) 5 MG TABS tablet Take 1 tablet (5 mg total) by mouth 2 (two) times daily.   . calcitRIOL (ROCALTROL) 0.25 MCG capsule Take 0.25 mcg by mouth daily.    . diclofenac Sodium (VOLTAREN) 1 % GEL Apply 4 g topically 4 (four) times daily. Apply to hip joint 10/26/2020: Ran out  . escitalopram (LEXAPRO) 10 MG tablet TAKE 1 TABLET(10 MG) BY MOUTH DAILY (Patient taking differently: Take 10 mg by mouth daily.)   . famotidine (PEPCID) 20 MG tablet Take 1 tablet (20 mg total) by mouth daily. (Patient taking differently: Take 20 mg by mouth daily as needed for heartburn or indigestion.)   . furosemide (LASIX) 20 MG tablet Take 1 tablet (20 mg total) by mouth daily.   . hydrALAZINE (APRESOLINE) 25 MG tablet Take 1 tablet (25 mg total) by mouth 3 (three) times daily.   . isosorbide mononitrate (IMDUR) 30 MG 24 hr tablet TAKE 1 TABLET(30 MG) BY MOUTH DAILY (Patient taking differently: Take 30 mg by mouth daily.)   . mirtazapine (REMERON) 15 MG tablet Take 1 tablet (15 mg total) by mouth at bedtime.   . nitroGLYCERIN (NITROSTAT) 0.4 MG SL tablet Place 1 tablet (0.4 mg total) under the tongue  every 5 (five) minutes as needed for chest pain. If do not resolve after 1 tablet, go to ED   . potassium chloride SA (KLOR-CON) 20 MEQ tablet TAKE HALF TABLET BY MOUTH EVERY DAY (Patient taking differently: Take 10 mEq by mouth daily.)   . rosuvastatin (CRESTOR) 20 MG tablet Take 1 tablet (20 mg total) by mouth at bedtime.   Jennifer Foster Zoster Vaccine Adjuvanted [facility]) injection Administer second dose after 2 months after the initial dose    No  facility-administered encounter medications on file as of 11/08/2020.    Patient Active Problem List   Diagnosis Date Noted  . Abnormal echocardiogram 11/02/2020  . Splenic infarct   . Abdominal pain 10/25/2020  . Sciatica of right side without back pain 07/12/2020  . Acquired thrombophilia ([place]) 01/24/2020  . Thigh pain 01/24/2020  . Hypokalemia 01/24/2020  . History of cocaine abuse ([place]) 10/03/2019  . Upper GI bleed 09/04/2019  . Chronic diastolic CHF (congestive heart failure) ([place]) 03/23/2018  . Aortic atherosclerosis ([place]) 01/01/2018  . CAD (coronary artery disease) 11/27/2017  . Atrial fibrillation ([place]) 10/13/2017  . Multinodular goiter 04/14/2017  . Adrenal adenoma 04/14/2017  . Thoracic aortic aneurysm without rupture ([place]) 04/03/2017  . Weight loss 01/30/2017  . Anxiety and depression 12/25/2015  . Chronic kidney disease, stage 3 ([place]) 06/27/2015  . Essential hypertension   . Gout 02/28/2014  . Lipoma 03/19/2011  . Hyperlipidemia 01/01/2011  . Chronic anemia 11/23/2009  . Tobacco abuse 09/03/2009    Conditions to be addressed/monitored: CHF and HTN  Care Plan : RN Case Manager  Updates made by Lazaro Arms, RN since 11/08/2020 12:00 AM  Problem: (Heart Failure)   Long-Range Goal: Patient will weigh daily and monitor syptoms   Start Date: 01/11/2020  Expected End Date: 01/29/2021  Priority: High  Note:    Current Barriers:  Jennifer Foster Knowledge deficit related to basic heart failure pathophysiology and self care management  Case Manager Clinical Goal(s):  Jennifer Foster Over the next 90 days, patient will weigh self daily and record  Interventions:  . Basic overview and discussion of  Heart Failure reviewed . Provided written education on low sodium diet . Provided verbal education on low sodium diet . Reviewed Heart Failure Action Plan in depth and provided written copy . Provided education about placing scale on hard, flat surface . Advised patient to weigh each morning after  emptying bladder . Discussed importance of daily weight and advised patient to weigh and record daily . medication-adherence assessment completed-she reports that she is taking her medications as prescribed . self-awareness of signs/symptoms of worsening disease encouraged . Patient is not weighing. She denies CP shortness of breath and swelling . Patient reports that she is working on quitting smoking.  A family member purchased some patches and she haas been using them and has not smoked any cigarettes in the past few days. . She is feeling stronger, eating more and sleeping well.   Patient Goals/Self Care Activities:  . Takes Heart Failure Medications as prescribed . Verbalizes understanding of and follows CHF Action Plan . Adheres to low sodium diet    Care Plan : RN Case Manager  Updates made by Lazaro Arms, RN since 11/08/2020 12:00 AM  Problem: Hypertension (Hypertension)   Long-Range Goal: Hypertension Monitored   Start Date: 07/11/2020  Expected End Date: 01/29/2021  Recent Progress: Not on track  Priority: High  Note:   Current Barriers:  Jennifer Foster Knowledge Deficits related to basic understanding of hypertension pathophysiology  and self care management  Nurse Case Manager Clinical Goal(s):  Jennifer Foster Over the next 30 days, patient will verbalize understanding of plan for hypertension management . Over the next 90 days, patient will demonstrate improved adherence to prescribed treatment plan for hypertension as evidenced by taking all medications as prescribed, monitoring and recording blood pressure as directed, adhering to low sodium/DASH diet  Interventions:  . Evaluation of current treatment plan related to hypertension self management and patient's adherence to plan as established by provider. . Provided education to patient re: stroke prevention, s/s of heart attack and stroke, DASH diet, complications of uncontrolled blood pressure . Reviewed medications with patient and discussed  importance of compliance . Discussed plans with patient for ongoing care management follow up and provided patient with direct contact information for care management team . Advised patient, providing education and rationale, to monitor blood pressure daily and record, calling PCP for findings outside established parameters.  . medication list reviewed . understanding of current medications assessed . Patient reports that she is not checking her BP.  She states that she forgets to check.  RNCM advised of of the importance of checking her BP and how it affects her body.  Advised her the best time to check is when she wakes up in the morning.  Patient agrees to check her BP at least 3 times /week and record the values.     Patient Goal/Self Care Activities:  . Self administers medications as prescribed . Attends all scheduled provider appointments . Calls provider office for new concerns, questions, or BP outside discussed parameters . Checks BP and records as discussed . Follows a low sodium diet/DASH diet . Check  BP 3 times / week and record values       Lazaro Arms RN, BSN, [place] Phone: [phone] I Fax: [phone]

## 2020-11-08 NOTE — Patient Instructions (Signed)
Visit Information  Jennifer Foster  it was nice speaking with you. Please call me directly [phone] if you have questions about the goals we discussed.  Goals Addressed              This Visit's Progress   .  Heart Failure (pt-stated)        Timeframe:  Long-Range Goal Priority:  High Start Date:     01/11/20                        Expected End Date: 01/29/21                Patient Goals/Self care Activities: . Takes Heart Failure Medications as prescribed . Verbalizes understanding of and follows CHF Action Plan . Adheres to low sodium diet . Patient will weigh daily and record values . Patient will Call 911 with any symptoms in the HF Red zone    .  Hypertention Goals         Timeframe:  Long-Range Goal Priority:  High Start Date:  07/11/20                           Expected End Date: 01/29/21    Patient Goal/Self Care Activities:  . Self administers medications as prescribed . Attends all scheduled provider appointments . Calls provider office for new concerns, questions, or BP outside discussed parameters . Checks BP and records as discussed . Follows a low sodium diet/DASH diet .  agree to work together to make changes . ask questions to understand       The patient verbalized understanding of instructions, educational materials, and care plan provided today and declined offer to receive copy of patient instructions, educational materials, and care plan.   Follow up Plan: Patient would like continued follow-up.  CCM RNCM will outreach the patient within the next 14 days.. Patient will call office if needed prior to next encounter  Lazaro Arms, RN

## 2020-11-09 ENCOUNTER — Encounter: Payer: Medicare HMO | Admitting: Cardiovascular Disease

## 2020-11-23 ENCOUNTER — Ambulatory Visit: Payer: Medicare HMO

## 2020-11-23 NOTE — Patient Instructions (Signed)
Visit Information  Jennifer Foster  it was nice speaking with you. Please call me directly [phone] if you have questions about the goals we discussed.  Goals Addressed              This Visit's Progress   .  Heart Failure (pt-stated)        Timeframe:  Long-Range Goal Priority:  High Start Date:     01/11/20                        Expected End Date: 02/28/21             Patient Goals/Self care Activities: . Takes Heart Failure Medications as prescribed . Verbalizes understanding of and follows CHF Action Plan . Adheres to low sodium diet . Patient will weigh daily and record values . Patient will Call 911 with any symptoms in the HF Red zone    .  Hypertention Goals         Timeframe:  Long-Range Goal Priority:  High Start Date:  07/11/20                           Expected End Date: 02/28/21    Patient Goal/Self Care Activities:  . Self administers medications as prescribed . Attends all scheduled provider appointments . Calls provider office for new concerns, questions, or BP outside discussed parameters . Checks BP and records as discussed . Follows a low sodium diet/DASH diet .  agree to work together to make changes . ask questions to understand       The patient verbalized understanding of instructions, educational materials, and care plan provided today and declined offer to receive copy of patient instructions, educational materials, and care plan.   Follow up Plan: Patient would like continued follow-up.  CCM RNCM will outreach the patient within the next 30 days.. Patient will call office if needed prior to next encounter  Lazaro Arms, RN  [phone]

## 2020-11-23 NOTE — Chronic Care Management (AMB) (Signed)
Care Management    RN Visit Note  11/23/2020 Name: LEVETTE Foster MRN: [id number] DOB: [birth year]  Subjective: Jennifer Foster is a 75 y.o. year old female who is a primary care patient of [name], MD. The care management team was consulted for assistance with disease management and care coordination needs.    Engaged with patient by telephone for follow up visit in response to provider referral for case management and/or care coordination services.     Assessment: Patient continues to experience difficulty with checking her BP and weighing... See Care Plan below for interventions and patient self-care actives. Follow up Plan: Patient would like continued follow-up.  CCM RNCM will outrreach the patient within the next 30 days.. Patient will call office if needed prior to next encounter  Review of patient past medical history, allergies, medications, health status, including review of consultants reports, laboratory and other test data, was performed as part of comprehensive evaluation and provision of chronic care management services.   SDOH (Social Determinants of Health) assessments and interventions performed:    Care Plan  Allergies  Allergen Reactions  . Ace Inhibitors Swelling and Other (See Comments)    Eyes swell  . Lisinopril Swelling and Other (See Comments)    Swollen tongue   . Tramadol Itching    Outpatient Encounter Medications as of 11/23/2020  Medication Sig Note  . albuterol (VENTOLIN HFA) 108 (90 Base) MCG/ACT inhaler Inhale 2 puffs into the lungs every 6 (six) hours as needed for wheezing or shortness of breath.   [name] amLODipine (NORVASC) 10 MG tablet TAKE 1 TABLET(10 MG) BY MOUTH DAILY (Patient taking differently: Take 10 mg by mouth daily.)   . apixaban (ELIQUIS) 5 MG TABS tablet Take 1 tablet (5 mg total) by mouth 2 (two) times daily.   . calcitRIOL (ROCALTROL) 0.25 MCG capsule Take 0.25 mcg by mouth daily.    . diclofenac Sodium (VOLTAREN) 1 % GEL  Apply 4 g topically 4 (four) times daily. Apply to hip joint 10/26/2020: Ran out  . escitalopram (LEXAPRO) 10 MG tablet TAKE 1 TABLET(10 MG) BY MOUTH DAILY (Patient taking differently: Take 10 mg by mouth daily.)   . famotidine (PEPCID) 20 MG tablet Take 1 tablet (20 mg total) by mouth daily. (Patient taking differently: Take 20 mg by mouth daily as needed for heartburn or indigestion.)   . furosemide (LASIX) 20 MG tablet Take 1 tablet (20 mg total) by mouth daily.   . hydrALAZINE (APRESOLINE) 25 MG tablet Take 1 tablet (25 mg total) by mouth 3 (three) times daily.   . isosorbide mononitrate (IMDUR) 30 MG 24 hr tablet TAKE 1 TABLET(30 MG) BY MOUTH DAILY (Patient taking differently: Take 30 mg by mouth daily.)   . mirtazapine (REMERON) 15 MG tablet Take 1 tablet (15 mg total) by mouth at bedtime.   . nitroGLYCERIN (NITROSTAT) 0.4 MG SL tablet Place 1 tablet (0.4 mg total) under the tongue every 5 (five) minutes as needed for chest pain. If do not resolve after 1 tablet, go to ED   . potassium chloride SA (KLOR-CON) 20 MEQ tablet TAKE HALF TABLET BY MOUTH EVERY DAY (Patient taking differently: Take 10 mEq by mouth daily.)   . rosuvastatin (CRESTOR) 20 MG tablet Take 1 tablet (20 mg total) by mouth at bedtime.   [name] Zoster Vaccine Adjuvanted [facility]) injection Administer second dose after 2 months after the initial dose    No facility-administered encounter medications on file as of 11/23/2020.  Patient Active Problem List   Diagnosis Date Noted  . Abnormal echocardiogram 11/02/2020  . Splenic infarct   . Abdominal pain 10/25/2020  . Sciatica of right side without back pain 07/12/2020  . Acquired thrombophilia ([place]) 01/24/2020  . Thigh pain 01/24/2020  . Hypokalemia 01/24/2020  . History of cocaine abuse ([place]) 10/03/2019  . Upper GI bleed 09/04/2019  . Chronic diastolic CHF (congestive heart failure) ([place]) 03/23/2018  . Aortic atherosclerosis ([place]) 01/01/2018  . CAD (coronary artery disease)  11/27/2017  . Atrial fibrillation ([place]) 10/13/2017  . Multinodular goiter 04/14/2017  . Adrenal adenoma 04/14/2017  . Thoracic aortic aneurysm without rupture ([place]) 04/03/2017  . Weight loss 01/30/2017  . Anxiety and depression 12/25/2015  . Chronic kidney disease, stage 3 ([place]) 06/27/2015  . Essential hypertension   . Gout 02/28/2014  . Lipoma 03/19/2011  . Hyperlipidemia 01/01/2011  . Chronic anemia 11/23/2009  . Tobacco abuse 09/03/2009    Conditions to be addressed/monitored: CHF and HTN  Care Plan : RN Case Manager  Updates made by Lazaro Arms, RN since 11/23/2020 12:00 AM  Problem: (Heart Failure)   Long-Range Goal: Patient will weigh daily and monitor syptoms   Start Date: 01/11/2020  Expected End Date: 02/28/2021  Priority: High  Note:    Current Barriers:  [name] Knowledge deficit related to basic heart failure pathophysiology and self care management  Case Manager Clinical Goal(s):  [name] Over the next 90 days, patient will weigh self daily and record  Interventions:  . Basic overview and discussion of  Heart Failure reviewed . Provided written education on low sodium diet . Provided verbal education on low sodium diet . Reviewed Heart Failure Action Plan in depth and provided written copy . Provided education about placing scale on hard, flat surface . Advised patient to weigh each morning after emptying bladder . Discussed importance of daily weight and advised patient to weigh and record daily . medication-adherence assessment completed-she reports that she is taking her medications as prescribed . self-awareness of signs/symptoms of worsening disease encouraged . Patient reports she is weighing but did not weight this morning. She denies CP shortness of breath and swelling. Discussed the importance of why she should weigh daily.   Patient Goals/Self Care Activities:  . Takes Heart Failure Medications as prescribed . Verbalizes understanding of and follows CHF Action  Plan . Adheres to low sodium diet    Care Plan : RN Case Manager  Updates made by Lazaro Arms, RN since 11/23/2020 12:00 AM  Problem: Hypertension (Hypertension)   Long-Range Goal: Hypertension Monitored   Start Date: 07/11/2020  Expected End Date: 01/29/2021  Recent Progress: Not on track  Priority: High  Note:   Current Barriers:  [name] Knowledge Deficits related to basic understanding of hypertension pathophysiology and self care management  Nurse Case Manager Clinical Goal(s):  [name] Over the next 30 days, patient will verbalize understanding of plan for hypertension management . Over the next 90 days, patient will demonstrate improved adherence to prescribed treatment plan for hypertension as evidenced by taking all medications as prescribed, monitoring and recording blood pressure as directed, adhering to low sodium/DASH diet  Interventions:  . Evaluation of current treatment plan related to hypertension self management and patient's adherence to plan as established by provider. . Provided education to patient re: stroke prevention, s/s of heart attack and stroke, DASH diet, complications of uncontrolled blood pressure . Reviewed medications with patient and discussed importance of compliance . Discussed plans with patient for ongoing  care management follow up and provided patient with direct contact information for care management team . Advised patient, providing education and rationale, to monitor blood pressure daily and record, calling PCP for findings outside established parameters.  . medication list reviewed . understanding of current medications assessed . Patient reports that she is checking her BP. But could not give me any values .  RNCM advised of of the importance of checking her BP and how it affects her body.    Patient Goal/Self Care Activities:  . Self administers medications as prescribed . Attends all scheduled provider appointments . Calls provider office for new  concerns, questions, or BP outside discussed parameters . Checks BP and records as discussed . Follows a low sodium diet/DASH diet . Check  BP 3 times / week and record values       Lazaro Arms RN, BSN, [place] Phone: [phone] I Fax: [phone]

## 2020-11-28 DIAGNOSIS — N2581 Secondary hyperparathyroidism of renal origin: Secondary | ICD-10-CM | POA: Diagnosis not present

## 2020-11-28 DIAGNOSIS — N184 Chronic kidney disease, stage 4 (severe): Secondary | ICD-10-CM | POA: Diagnosis not present

## 2020-11-28 DIAGNOSIS — N189 Chronic kidney disease, unspecified: Secondary | ICD-10-CM | POA: Diagnosis not present

## 2020-11-28 DIAGNOSIS — D631 Anemia in chronic kidney disease: Secondary | ICD-10-CM | POA: Diagnosis not present

## 2020-11-28 DIAGNOSIS — I129 Hypertensive chronic kidney disease with stage 1 through stage 4 chronic kidney disease, or unspecified chronic kidney disease: Secondary | ICD-10-CM | POA: Diagnosis not present

## 2020-11-30 ENCOUNTER — Other Ambulatory Visit: Payer: Self-pay | Admitting: Family Medicine

## 2020-12-06 ENCOUNTER — Other Ambulatory Visit: Payer: Self-pay | Admitting: Family Medicine

## 2020-12-24 ENCOUNTER — Other Ambulatory Visit: Payer: Self-pay | Admitting: Physician Assistant

## 2020-12-27 ENCOUNTER — Ambulatory Visit: Payer: Medicare HMO

## 2020-12-27 NOTE — Chronic Care Management (AMB) (Signed)
Care Management    RN Visit Note  12/27/2020 Name: Jennifer Foster MRN: [id number] DOB: Sep 12, 1945  Subjective: Jennifer Foster is a 75 y.o. year old female who is a primary care patient of [name], MD. The care management team was consulted for assistance with disease management and care coordination needs.    Engaged with patient by telephone for follow up visit in response to provider referral for case management and/or care coordination services.   The patient was given information about Chronic Care Management services, agreed to services, and gave verbal consent prior to initiation of services.  Please see initial visit note for detailed documentation.   Patient agreed to services and consent obtained.    Assessment: The patient is currently not checking her BP or weighing she states that she had to lay her grandson to rest on friday.  She is eating and taking hermedications but has a lot on her mind.. See Care Plan below for interventions and patient self-care actives. Follow up Plan: Patient would like continued follow-up.  CCM RNCM will outreach the patient within the next 3 weeks  Patient will call office if needed prior to next encounter : Review of patient past medical history, allergies, medications, health status, including review of consultants reports, laboratory and other test data, was performed as part of comprehensive evaluation and provision of chronic care management services.   SDOH (Social Determinants of Health) assessments and interventions performed:    Care Plan  Allergies  Allergen Reactions  . Ace Inhibitors Swelling and Other (See Comments)    Eyes swell  . Lisinopril Swelling and Other (See Comments)    Swollen tongue   . Tramadol Itching    Outpatient Encounter Medications as of 12/27/2020  Medication Sig Note  . albuterol (VENTOLIN HFA) 108 (90 Base) MCG/ACT inhaler Inhale 2 puffs into the lungs every 6 (six) hours as needed for wheezing or  shortness of breath.   [name] amLODipine (NORVASC) 10 MG tablet TAKE 1 TABLET(10 MG) BY MOUTH DAILY   . apixaban (ELIQUIS) 5 MG TABS tablet Take 1 tablet (5 mg total) by mouth 2 (two) times daily.   . calcitRIOL (ROCALTROL) 0.25 MCG capsule Take 0.25 mcg by mouth daily.    . diclofenac Sodium (VOLTAREN) 1 % GEL Apply 4 g topically 4 (four) times daily. Apply to hip joint 10/26/2020: Ran out  . escitalopram (LEXAPRO) 10 MG tablet TAKE 1 TABLET(10 MG) BY MOUTH DAILY (Patient taking differently: Take 10 mg by mouth daily.)   . famotidine (PEPCID) 20 MG tablet Take 1 tablet (20 mg total) by mouth daily as needed for heartburn or indigestion.   . furosemide (LASIX) 20 MG tablet TAKE 1 TABLET(20 MG) BY MOUTH DAILY   . hydrALAZINE (APRESOLINE) 25 MG tablet Take 1 tablet (25 mg total) by mouth 3 (three) times daily.   . isosorbide mononitrate (IMDUR) 30 MG 24 hr tablet TAKE 1 TABLET(30 MG) BY MOUTH DAILY (Patient taking differently: Take 30 mg by mouth daily.)   . mirtazapine (REMERON) 15 MG tablet Take 1 tablet (15 mg total) by mouth at bedtime.   . nitroGLYCERIN (NITROSTAT) 0.4 MG SL tablet Place 1 tablet (0.4 mg total) under the tongue every 5 (five) minutes as needed for chest pain. If do not resolve after 1 tablet, go to ED   . potassium chloride SA (KLOR-CON) 20 MEQ tablet TAKE 1/2 TABLET BY MOUTH EVERY DAY   . rosuvastatin (CRESTOR) 20 MG tablet Take 1  tablet (20 mg total) by mouth at bedtime.   [name] Zoster Vaccine Adjuvanted [facility]) injection Administer second dose after 2 months after the initial dose    No facility-administered encounter medications on file as of 12/27/2020.    Patient Active Problem List   Diagnosis Date Noted  . Abnormal echocardiogram 11/02/2020  . Splenic infarct   . Abdominal pain 10/25/2020  . Sciatica of right side without back pain 07/12/2020  . Acquired thrombophilia ([place]) 01/24/2020  . Thigh pain 01/24/2020  . Hypokalemia 01/24/2020  . History of cocaine abuse ([place])  10/03/2019  . Upper GI bleed 09/04/2019  . Chronic diastolic CHF (congestive heart failure) ([place]) 03/23/2018  . Aortic atherosclerosis ([place]) 01/01/2018  . CAD (coronary artery disease) 11/27/2017  . Atrial fibrillation ([place]) 10/13/2017  . Multinodular goiter 04/14/2017  . Adrenal adenoma 04/14/2017  . Thoracic aortic aneurysm without rupture ([place]) 04/03/2017  . Weight loss 01/30/2017  . Anxiety and depression 12/25/2015  . Chronic kidney disease, stage 3 ([place]) 06/27/2015  . Essential hypertension   . Gout 02/28/2014  . Lipoma 03/19/2011  . Hyperlipidemia 01/01/2011  . Chronic anemia 11/23/2009  . Tobacco abuse 09/03/2009    Conditions to be addressed/monitored: CHF and HTN  Care Plan : RN Case Manager  Updates made by Lazaro Arms, RN since 12/27/2020 12:00 AM  Problem: (Heart Failure)   Long-Range Goal: Patient will weigh daily and monitor syptoms   Start Date: 01/11/2020  Expected End Date: 02/28/2021  Priority: High   Current Barriers:  [name] Knowledge deficit related to basic heart failure pathophysiology and self care management  Case Manager Clinical Goal(s):  [name] Over the next 90 days, patient will weigh self daily and record  Interventions:  . Basic overview and discussion of  Heart Failure reviewed . Provided written education on low sodium diet . Provided verbal education on low sodium diet . Reviewed Heart Failure Action Plan in depth and provided written copy . Provided education about placing scale on hard, flat surface . Advised patient to weigh each morning after emptying bladder . Discussed importance of daily weight and advised patient to weigh and record daily . medication-adherence assessment completed-she reports that she is taking her medications as prescribed . self-awareness of signs/symptoms of worsening disease encouraged . Patient reports she has lost some weight.  Her grandson recently passed and they laid him to rest on Friday. She states that she is  able to eat and had a good breakfast this morning.  She denies CP shortness of breath and swelling. RNCM offered patient connection with LCSW for counseling services she declined.  She stated that she will get back with me if it is needed.  Patient Goals/Self Care Activities:  . Takes Heart Failure Medications as prescribed . Verbalizes understanding of and follows CHF Action Plan . Adheres to low sodium diet    Care Plan : RN Case Manager  Updates made by Lazaro Arms, RN since 12/27/2020 12:00 AM  Problem: Hypertension (Hypertension)   Long-Range Goal: Hypertension Monitored   Start Date: 07/11/2020  Expected End Date: 01/29/2021  Recent Progress: Not on track  Priority: High  Current Barriers:  [name] Knowledge Deficits related to basic understanding of hypertension pathophysiology and self care management  Nurse Case Manager Clinical Goal(s):  [name] Over the next 30 days, patient will verbalize understanding of plan for hypertension management . Over the next 90 days, patient will demonstrate improved adherence to prescribed treatment plan for hypertension as evidenced by taking all medications  as prescribed, monitoring and recording blood pressure as directed, adhering to low sodium/DASH diet  Interventions:  . Evaluation of current treatment plan related to hypertension self management and patient's adherence to plan as established by provider. . Provided education to patient re: stroke prevention, s/s of heart attack and stroke, DASH diet, complications of uncontrolled blood pressure . Reviewed medications with patient and discussed importance of compliance . Discussed plans with patient for ongoing care management follow up and provided patient with direct contact information for care management team . Advised patient, providing education and rationale, to monitor blood pressure daily and record, calling PCP for findings outside established parameters.  . medication list  reviewed . understanding of current medications assessed . Patient reports that she has not checked her BP and could not give me any values .  Patient denies any symptoms of HTN.  RNCM advised of of the importance of checking her BP.  Patient Goal/Self Care Activities:  . Self administers medications as prescribed . Attends all scheduled provider appointments . Calls provider office for new concerns, questions, or BP outside discussed parameters . Checks BP and records as discussed . Follows a low sodium diet/DASH diet . Check  BP 3 times / week and record values     Problem: Pain   Goal: Pain Managed Completed 12/27/2020  Start Date: 07/11/2020  . Chronic Disease Management support, education, and care coordination needs related to   Right Hip and leg  pain as evidenced by patient stating patient stating she has pain like pins and needles in her right leg for 1 week and she is unable to sleep.   Clinical Goal(s) related to  Right Hip and Leg Pain :  Over the next 10 days, patient will:  . Work with the care management team to address educational, disease management, and care coordination needs  . Call provider office for new or worsened signs and symptoms  . Call care management team with questions or concerns . Verbalize basic understanding of patient centered plan of care established today  Interventions related to  Right Hip and Leg Pain :  . Evaluation of current treatment plans and patient's adherence to plan as established by provider . Assessed patient understanding of disease states . Assessed patient's education and care coordination needs . Provided disease specific education to patient  . Collaborated with appropriate clinical care team members regarding patient needs . RNCM Help make patient an appointment at the office for 07/12/20 at 1030 am. . Patient denies any pain in her hip and leg today.  Patient Goals/Self Care Activities related to  Right Hip and Leg Pain :   . Patient will follow up with the doctor . Patient will continue to use comfort modalities for her pain like heat /cold , tylenol   The care management team will reach out to the patient again over the next 10 days.

## 2020-12-27 NOTE — Patient Instructions (Signed)
Visit Information  Jennifer Foster  it was nice speaking with you. Please call me directly [phone] if you have questions about the goals we discussed.  Goals Addressed              This Visit's Progress   .  Heart Failure (pt-stated)        Timeframe:  Long-Range Goal Priority:  High Start Date:     01/11/20                        Expected End Date: 03/29/21         Patient Goals/Self care Activities: . Takes Heart Failure Medications as prescribed . Verbalizes understanding of and follows CHF Action Plan . Adheres to low sodium diet . Patient will weigh daily and record values . Patient will Call 911 with any symptoms in the HF Red zone    .  Hypertention Goals         Timeframe:  Long-Range Goal Priority:  High Start Date:  07/11/20                           Expected End Date: 03/29/21    Patient Goal/Self Care Activities:  . Self administers medications as prescribed . Attends all scheduled provider appointments . Calls provider office for new concerns, questions, or BP outside discussed parameters . Checks BP and records as discussed . Follows a low sodium diet/DASH diet .  agree to work together to make changes . ask questions to understand       The patient verbalized understanding of instructions, educational materials, and care plan provided today and declined offer to receive copy of patient instructions, educational materials, and care plan.   Follow up Plan: Patient would like continued follow-up.  CCM RNCM will outreach the patient within the next 3 weeks  Patient will call office if needed prior to next encounter  Lazaro Arms, RN  [phone]

## 2021-01-02 ENCOUNTER — Ambulatory Visit: Payer: Medicare HMO

## 2021-01-03 ENCOUNTER — Ambulatory Visit (INDEPENDENT_AMBULATORY_CARE_PROVIDER_SITE_OTHER): Payer: Medicare HMO

## 2021-01-03 DIAGNOSIS — Z0000 Encounter for general adult medical examination without abnormal findings: Secondary | ICD-10-CM | POA: Diagnosis not present

## 2021-01-03 NOTE — Progress Notes (Signed)
Subjective:   Jennifer Foster is a 75 y.o. female who presents for Medicare Annual (Subsequent) preventive examination.  Patient consented to have virtual visit and was identified by name and date of birth. Method of visit: Telephone  Encounter participants: Patient: Jennifer Foster - located at Home     Nurse/Provider: Dorna Foster - located at [facility] Others (if applicable): NA  Review of Systems: Defer to PCP  Cardiac Risk Factors include: advanced age (>94men, >67 women)  Objective:   Vitals: There were no vitals taken for this visit.  There is no height or weight on file to calculate BMI.  Advanced Directives 01/03/2021 11/02/2020 10/25/2020 09/28/2020 07/12/2020 04/06/2020 01/24/2020  Does Patient Have a Medical Advance Directive? No No No No No No No  Does patient want to make changes to medical advance directive? - - - - - - -  Would patient like information on creating a medical advance directive? No - Patient declined No - Patient declined Yes (Inpatient - patient defers creating a medical advance directive at this time - Information given);No - Patient declined Yes (MAU/Ambulatory/Procedural Areas - Information given) No - Patient declined No - Patient declined No - Patient declined  Pre-existing out of facility DNR order (yellow form or pink MOST form) - - - - - - -   Tobacco Social History   Tobacco Use  Smoking Status Current Some Day Smoker  . Packs/day: 0.25  . Types: Cigarettes  Smokeless Tobacco Never Used     Ready to quit: No Counseling given: Yes  Clinical Intake:  Pre-visit preparation completed: Yes  Pain Score: 0-No pain   How often do you need to have someone help you when you read instructions, pamphlets, or other written materials from your doctor or pharmacy?: 3 - Sometimes What is the last grade level you completed in school?: 11th grade  Interpreter Needed?: No  Past Medical History:  Diagnosis Date  . Acute on chronic blood loss anemia 06/26/2015   . Acute respiratory failure with hypoxia ([place]) 02/21/2018  . Aortic atherosclerosis ([place]) 01/01/2018   CT 04/2017  . Asthma   . Atrial fibrillation ([place]) 10/13/2017   Newly diagnosed in Jan 2019 // Apixaban for anticoag // Apixaban held in 2019 for anemia but resumed; Hgb stable since restarting  . CAD (coronary artery disease) 11/27/2017   Nuc stress 3/19: anterior ischemia, ?inf scar, EF 50, intermediate risk // LHC 4/19: LAD mild dz, pLCx 35, OM2 30, mRCA 60, dRCA 65, LVEDP 15-20  . Chronic diastolic CHF (congestive heart failure) ([place]) 03/23/2018   Echo 2/19: Moderate LVH, EF 45-80, grade 2 diastolic dysfunction, trivial MR, moderate to severe LAE, PASP 30//Echocardiogram 7/21: EF 55-60, severe LVH, severe LAE, mod RAE, mild MR, mild dilation of Asc aorta (40 mm), RVSP 27.6   . Chronic kidney disease (CKD), stage III (moderate) ([place]) 06/27/2015  . DIVERTICULAR BLEEDING, HX OF 10/14/2007   Colonoscopy 2008 showed diverticulosis.   [name] GERD (gastroesophageal reflux disease)   . Gout   . Hyperlipemia   . Hypertension   . Hypertensive urgency 10/25/2020  . Proteinuria 01/11/2016  . Thoracic aortic aneurysm 04/03/2017   CT 8/18: ascending thoracic aorta 4.1 cm // unable to do CTA due to CKD // Chest MRA 05/2019: Ascending thoracic aorta 40 mm   Past Surgical History:  Procedure Laterality Date  . ABDOMINAL HYSTERECTOMY  1981   partial, per pt history  . BIOPSY  09/05/2019   Procedure: BIOPSY;  Surgeon:  Ronnette Juniper, MD;  Location: [facility];  Service: Gastroenterology;;  . COLONOSCOPY N/A 12/08/2013   Procedure: COLONOSCOPY;  Surgeon: [name], MD;  Location: [facility];  Service: Endoscopy;  Laterality: N/A;  . COLONOSCOPY N/A 06/28/2015   Procedure: COLONOSCOPY;  Surgeon: Clarene Essex, MD;  Location[facility] ENDOSCOPY;  Service: Endoscopy;  Laterality: N/A;  . ESOPHAGOGASTRODUODENOSCOPY N/A 12/08/2013   Procedure: ESOPHAGOGASTRODUODENOSCOPY (EGD);  Surgeon: [name], MD;  Location: [facility]  ENDOSCOPY;  Service: Endoscopy;  Laterality: N/A;  . ESOPHAGOGASTRODUODENOSCOPY (EGD) WITH PROPOFOL N/A 09/05/2019   Procedure: ESOPHAGOGASTRODUODENOSCOPY (EGD) WITH PROPOFOL;  Surgeon: Ronnette Juniper, MD;  Location: [facility] ENDOSCOPY;  Service: Gastroenterology;  Laterality: N/A;  . GIVENS CAPSULE STUDY N/A 12/08/2013   Procedure: GIVENS CAPSULE STUDY;  Surgeon: [name], MD;  Location: [facility];  Service: Endoscopy;  Laterality: N/A;  . LEFT HEART CATH AND CORONARY ANGIOGRAPHY N/A 12/03/2017   Procedure: LEFT HEART CATH AND CORONARY ANGIOGRAPHY;  Surgeon: Nelva Bush, MD;  Location: [facility] CV LAB;  Service: Cardiovascular;  Laterality: N/A;  . LIPOMA EXCISION  01/28/11   neck   Family History  Problem Relation Age of Onset  . Diabetes type II Other   . Kidney failure Other   . Kidney failure Son    Social History   Socioeconomic History  . Marital status: Married    Spouse name: Jennifer Foster  . Number of children: Not on file  . Years of education: 35  . Highest education level: 11th grade  Occupational History  . Occupation: unemployed  Tobacco Use  . Smoking status: Current Some Day Smoker    Packs/day: 0.25    Types: Cigarettes  . Smokeless tobacco: Never Used  Vaping Use  . Vaping Use: Never used  Substance and Sexual Activity  . Alcohol use: Yes    Alcohol/week: 2.0 standard drinks    Types: 2 Standard drinks or equivalent per week    Comment: 1-2 beers   . Drug use: No  . Sexual activity: Yes  Other Topics Concern  . Not on file  Social History Narrative   Lives with husband Jennifer Foster who is also an [facility] patient.     History of Marijuana and crack use.  Had cocaine + in May 2012.   Social Determinants of Health   Financial Resource Strain: Low Risk   . Difficulty of Paying Living Expenses: Not hard at all  Food Insecurity: No Food Insecurity  . Worried About Charity fundraiser in the Last Year: Never true  . Ran Out of Food in the Last Year: Never true   Transportation Needs: Unmet Transportation Needs  . Lack of Transportation (Medical): Yes  . Lack of Transportation (Non-Medical): Yes  Physical Activity: Insufficiently Active  . Days of Exercise per Week: 2 days  . Minutes of Exercise per Session: 30 min  Stress: Stress Concern Present  . Feeling of Stress : Rather much  Social Connections: Moderately Integrated  . Frequency of Communication with Friends and Family: More than three times a week  . Frequency of Social Gatherings with Friends and Family: More than three times a week  . Attends Religious Services: More than 4 times per year  . Active Member of Clubs or Organizations: No  . Attends Archivist Meetings: Never  . Marital Status: Married   Outpatient Encounter Medications as of 01/03/2021  Medication Sig  . albuterol (VENTOLIN HFA) 108 (90 Base) MCG/ACT inhaler Inhale 2 puffs into the lungs  every 6 (six) hours as needed for wheezing or shortness of breath.  [name] amLODipine (NORVASC) 10 MG tablet TAKE 1 TABLET(10 MG) BY MOUTH DAILY  . apixaban (ELIQUIS) 5 MG TABS tablet Take 1 tablet (5 mg total) by mouth 2 (two) times daily.  . calcitRIOL (ROCALTROL) 0.25 MCG capsule Take 0.25 mcg by mouth daily.   [name] escitalopram (LEXAPRO) 10 MG tablet TAKE 1 TABLET(10 MG) BY MOUTH DAILY (Patient taking differently: Take 10 mg by mouth daily.)  . furosemide (LASIX) 20 MG tablet TAKE 1 TABLET(20 MG) BY MOUTH DAILY  . hydrALAZINE (APRESOLINE) 25 MG tablet Take 1 tablet (25 mg total) by mouth 3 (three) times daily.  . isosorbide mononitrate (IMDUR) 30 MG 24 hr tablet TAKE 1 TABLET(30 MG) BY MOUTH DAILY (Patient taking differently: Take 30 mg by mouth daily.)  . mirtazapine (REMERON) 15 MG tablet Take 1 tablet (15 mg total) by mouth at bedtime.  . nitroGLYCERIN (NITROSTAT) 0.4 MG SL tablet Place 1 tablet (0.4 mg total) under the tongue every 5 (five) minutes as needed for chest pain. If do not resolve after 1 tablet, go to ED  . potassium  chloride SA (KLOR-CON) 20 MEQ tablet TAKE 1/2 TABLET BY MOUTH EVERY DAY  . rosuvastatin (CRESTOR) 20 MG tablet Take 1 tablet (20 mg total) by mouth at bedtime.  . diclofenac Sodium (VOLTAREN) 1 % GEL Apply 4 g topically 4 (four) times daily. Apply to hip joint (Patient not taking: Reported on 01/03/2021)  . famotidine (PEPCID) 20 MG tablet Take 1 tablet (20 mg total) by mouth daily as needed for heartburn or indigestion. (Patient not taking: Reported on 01/03/2021)  . Zoster Vaccine Adjuvanted [facility]) injection Administer second dose after 2 months after the initial dose   No facility-administered encounter medications on file as of 01/03/2021.   Activities of Daily Living In your present state of health, do you have any difficulty performing the following activities: 01/03/2021 10/25/2020  Hearing? N N  Vision? N N  Difficulty concentrating or making decisions? N N  Walking or climbing stairs? N N  Dressing or bathing? N N  Doing errands, shopping? Y N  Comment Husband drives, he is not always around -  Preparing Food and eating ? N -  Using the Toilet? N -  In the past six months, have you accidently leaked urine? N -  Do you have problems with loss of bowel control? N -  Managing your Medications? N -  Managing your Finances? N -  Housekeeping or managing your Housekeeping? N -  Some recent data might be hidden   Patient Care Team: [name], MD as PCP - General (Family Medicine) Nahser, Wonda Cheng, MD as PCP - Cardiology (Cardiology) Delora Fuel, MD (Emergency Medicine) Maurine Cane, LCSW as Social Worker (Licensed Clinical Social Worker) Lazaro Arms, RN as Case Manager    Assessment:   This is a routine wellness examination for [place].  Exercise Activities and Dietary recommendations Current Exercise Habits: Home exercise routine, Type of exercise: walking, Time (Minutes): 20, Frequency (Times/Week): 2, Weekly Exercise (Minutes/Week): 40, Exercise limited by: cardiac  condition(s)  Goals    .  Advance Directive      CARE PLAN ENTRY (see longitudinal plan of care for additional care plan information)  Current Barriers:  . Patient does not have an Forensic scientist . Patient acknowledges deficits, education and support in order to complete this document Clinical Social Work Goal(s): Over the next 30 to 60 days,  .  the patient will review and complete Advance Directive packet, have notarized and provide a copy to provider office . review mailed EMMI education on Advance Directive as evidenced by patient self report of review Interventions provided by LCSW: . Assessed understanding of Advance Directives . A voluntary discussion about advanced care planning including importance of advanced directives, healthcare proxy and living will was discussed with the patient.  . Mailed the patient EMMI educational information on Advance Directives as well as an Forensic scientist packet Patient Self Care Activities:  . Is able to complete documentation independently . Able to identify [place] / [place] . patient will review information mailed by LCSW Initial goal documentation    .  Connect with counseling      Patient Activities :  . Keep appointment at the [facility] for counseling with Veverly Fells  Nov.30th at 4:00   [address], [place] [address] [phone] . Please call if you are unable to keep the appointment    .  Heart Failure (pt-stated)      Timeframe:  Long-Range Goal Priority:  High Start Date:     01/11/20                        Expected End Date: 03/29/21         Patient Goals/Self care Activities: . Takes Heart Failure Medications as prescribed . Verbalizes understanding of and follows CHF Action Plan . Adheres to low sodium diet . Patient will weigh daily and record values . Patient will Call 911 with any symptoms in the HF Red zone    .  Hypertention Goals       Timeframe:  Long-Range  Goal Priority:  High Start Date:  07/11/20                           Expected End Date: 03/29/21    Patient Goal/Self Care Activities:  . Self administers medications as prescribed . Attends all scheduled provider appointments . Calls provider office for new concerns, questions, or BP outside discussed parameters . Checks BP and records as discussed . Follows a low sodium diet/DASH diet .  agree to work together to make changes . ask questions to understand    .  Quit Smoking      Counseling given.      Fall Risk Fall Risk  01/03/2021 11/02/2020 09/28/2020 07/12/2020 01/24/2020  Falls in the past year? 0 0 0 0 0  Number falls in past yr: - - 0 0 -  Injury with Fall? - - 0 0 -  Comment - - - - -  Follow up Falls prevention discussed - - - -   Is the patient's home free of loose throw rugs in walkways, pet beds, electrical cords, etc?   yes      Grab bars in the bathroom? yes      Handrails on the stairs?   yes      Adequate lighting?   yes  Patient rating of health (0-10) scale: 10  Depression Screen PHQ 2/9 Scores 01/03/2021 01/03/2021 01/03/2021 11/02/2020  PHQ - 2 Score - 0 0 0  PHQ- 9 Score - - - 0  Exception Documentation Patient refusal - - -  Not completed - - - -    Cognitive Function  6CIT Screen 01/03/2021  What Year? 0 points  What month? 0 points  What time? 0 points  Count back from 20 0 points  Months in reverse 2 points  Repeat phrase 0 points  Total Score 2   Immunization History  Administered Date(s) Administered  . Fluad Quad(high Dose 65+) 05/31/2019, 07/12/2020  . Influenza Split 07/16/2011, 07/20/2012  . Influenza, High Dose Seasonal PF 05/14/2015  . Influenza-Unspecified 09/01/2013, 08/26/2014, 06/04/2016, 06/01/2017  . PFIZER Comirnaty(Gray Top)Covid-19 Tri-Sucrose Vaccine 09/28/2020  . PFIZER(Purple Top)SARS-COV-2 Vaccination 12/01/2019, 01/23/2020  . Pneumococcal Conjugate-13 12/12/2014, 02/14/2015  . Pneumococcal Polysaccharide-23 03/18/2016  .  Tdap 06/04/2016   Screening Tests Health Maintenance  Topic Date Due  . MAMMOGRAM  03/06/2021  . INFLUENZA VACCINE  04/01/2021  . COLONOSCOPY (Pts 45-15yrs Insurance coverage will need to be confirmed)  06/27/2025  . TETANUS/TDAP  06/04/2026  . DEXA SCAN  Completed  . COVID-19 Vaccine  Completed  . Hepatitis C Screening  Completed  . PNA vac Low Risk Adult  Completed  . HPV VACCINES  Aged Out   Cancer Screenings: Lung: Low Dose CT Chest recommended if Age 83-80 years, 30 pack-year currently smoking OR have quit w/in 15years. Patient does qualify. Breast:  Up to date on Mammogram? Yes   Up to date of Bone Density/Dexa? Yes Colorectal: UTD  Additional Screenings: Hepatitis C Screening: Completed   Plan:  PCP apt made for 01/22/2021. Ask for an advance directive packet.  Keep up the good work walking!  I have personally reviewed and noted the following in the patient's chart:   . Medical and social history . Use of alcohol, tobacco or illicit drugs  . Current medications and supplements . Functional ability and status . Nutritional status . Physical activity . Advanced directives . List of other physicians . Hospitalizations, surgeries, and ER visits in previous 12 months . Vitals . Screenings to include cognitive, depression, and falls . Referrals and appointments  In addition, I have reviewed and discussed with patient certain preventive protocols, quality metrics, and best practice recommendations. A written personalized care plan for preventive services as well as general preventive health recommendations were provided to patient.  This visit was conducted virtually in the setting of the [place] pandemic.    Jennifer Foster, [place]  01/07/2021

## 2021-01-07 ENCOUNTER — Other Ambulatory Visit: Payer: Self-pay | Admitting: Family Medicine

## 2021-01-07 NOTE — Patient Instructions (Signed)
You spoke to Jennifer Foster, [place] over the phone for your annual wellness visit.  We discussed goals: Goals    .  Advance Directive      CARE PLAN ENTRY (see longitudinal plan of care for additional care plan information)  Current Barriers:  . Patient does not have an Forensic scientist . Patient acknowledges deficits, education and support in order to complete this document Clinical Social Work Goal(s): Over the next 30 to 60 days,  . the patient will review and complete Advance Directive packet, have notarized and provide a copy to provider office . review mailed EMMI education on Advance Directive as evidenced by patient self report of review Interventions provided by LCSW: . Assessed understanding of Advance Directives . A voluntary discussion about advanced care planning including importance of advanced directives, healthcare proxy and living will was discussed with the patient.  . Mailed the patient EMMI educational information on Advance Directives as well as an Forensic scientist packet Patient Self Care Activities:  . Is able to complete documentation independently . Able to identify [place] / [place] . patient will review information mailed by LCSW Initial goal documentation    .  Connect with counseling      Patient Activities :  . Keep appointment at the [facility] for counseling with Veverly Fells  Nov.30th at 4:00   [address], [place] [address] [phone] . Please call if you are unable to keep the appointment    .  Heart Failure (pt-stated)      Timeframe:  Long-Range Goal Priority:  High Start Date:     01/11/20                        Expected End Date: 03/29/21         Patient Goals/Self care Activities: . Takes Heart Failure Medications as prescribed . Verbalizes understanding of and follows CHF Action Plan . Adheres to low sodium diet . Patient will weigh daily and record values . Patient will Call 911 with  any symptoms in the HF Red zone    .  Hypertention Goals       Timeframe:  Long-Range Goal Priority:  High Start Date:  07/11/20                           Expected End Date: 03/29/21    Patient Goal/Self Care Activities:  . Self administers medications as prescribed . Attends all scheduled provider appointments . Calls provider office for new concerns, questions, or BP outside discussed parameters . Checks BP and records as discussed . Follows a low sodium diet/DASH diet .  agree to work together to make changes . ask questions to understand    .  Quit Smoking      Counseling given.      We also discussed recommended health maintenance. As discussed, you are up to date with everything!  Health Maintenance  Topic Date Due  . MAMMOGRAM  03/06/2021  . INFLUENZA VACCINE  04/01/2021  . COLONOSCOPY (Pts 45-41yr Insurance coverage will need to be confirmed)  06/27/2025  . TETANUS/TDAP  06/04/2026  . DEXA SCAN  Completed  . COVID-19 Vaccine  Completed  . Hepatitis C Screening  Completed  . PNA vac Low Risk Adult  Completed  . HPV VACCINES  Aged Out   PCP apt made for 01/22/2021. Ask for an advance  directive packet.  Keep up the good work walking!  We also discussed smoking cessation. 1-800-QUIT-NOW  Preventive Care 39 Years and Older, Female Preventive care refers to lifestyle choices and visits with your health care provider that can promote health and wellness. This includes:  A yearly physical exam. This is also called an annual wellness visit.  Regular dental and eye exams.  Immunizations.  Screening for certain conditions.  Healthy lifestyle choices, such as: ? Eating a healthy diet. ? Getting regular exercise. ? Not using drugs or products that contain nicotine and tobacco. ? Limiting alcohol use. What can I expect for my preventive care visit? Physical exam Your health care provider will check your:  Height and weight. These may be used to calculate your  BMI (body mass index). BMI is a measurement that tells if you are at a healthy weight.  Heart rate and blood pressure.  Body temperature.  Skin for abnormal spots. Counseling Your health care provider may ask you questions about your:  Past medical problems.  Family's medical history.  Alcohol, tobacco, and drug use.  Emotional well-being.  Home life and relationship well-being.  Sexual activity.  Diet, exercise, and sleep habits.  History of falls.  Memory and ability to understand (cognition).  Work and work Statistician.  Pregnancy and menstrual history.  Access to firearms. What immunizations do I need? Vaccines are usually given at various ages, according to a schedule. Your health care provider will recommend vaccines for you based on your age, medical history, and lifestyle or other factors, such as travel or where you work.   What tests do I need? Blood tests  Lipid and cholesterol levels. These may be checked every 5 years, or more often depending on your overall health.  Hepatitis C test.  Hepatitis B test. Screening  Lung cancer screening. You may have this screening every year starting at age 52 if you have a 30-pack-year history of smoking and currently smoke or have quit within the past 15 years.  Colorectal cancer screening. ? All adults should have this screening starting at age 76 and continuing until age 14. ? Your health care provider may recommend screening at age 63 if you are at increased risk. ? You will have tests every 1-10 years, depending on your results and the type of screening test.  Diabetes screening. ? This is done by checking your blood sugar (glucose) after you have not eaten for a while (fasting). ? You may have this done every 1-3 years.  Mammogram. ? This may be done every 1-2 years. ? Talk with your health care provider about how often you should have regular mammograms.  Abdominal aortic aneurysm (AAA) screening. You  may need this if you are a current or former smoker.  BRCA-related cancer screening. This may be done if you have a family history of breast, ovarian, tubal, or peritoneal cancers. Other tests  STD (sexually transmitted disease) testing, if you are at risk.  Bone density scan. This is done to screen for osteoporosis. You may have this done starting at age 75. Talk with your health care provider about your test results, treatment options, and if necessary, the need for more tests. Follow these instructions at home: Eating and drinking  Eat a diet that includes fresh fruits and vegetables, whole grains, lean protein, and low-fat dairy products. Limit your intake of foods with high amounts of sugar, saturated fats, and salt.  Take vitamin and mineral supplements as recommended by your health  care provider.  Do not drink alcohol if your health care provider tells you not to drink.  If you drink alcohol: ? Limit how much you have to 0-1 drink a day. ? Be aware of how much alcohol is in your drink. In the [place] one drink equals one 12 oz bottle of beer (355 mL), one 5 oz glass of wine (148 mL), or one 1 oz glass of hard liquor (44 mL).   Lifestyle  Take daily care of your teeth and gums. Brush your teeth every morning and night with fluoride toothpaste. Floss one time each day.  Stay active. Exercise for at least 30 minutes 5 or more days each week.  Do not use any products that contain nicotine or tobacco, such as cigarettes, e-cigarettes, and chewing tobacco. If you need help quitting, ask your health care provider.  Do not use drugs.  If you are sexually active, practice safe sex. Use a condom or other form of protection in order to prevent STIs (sexually transmitted infections).  Talk with your health care provider about taking a low-dose aspirin or statin.  Find healthy ways to cope with stress, such as: ? Meditation, yoga, or listening to music. ? Journaling. ? Talking to a  trusted person. ? Spending time with friends and family. Safety  Always wear your seat belt while driving or riding in a vehicle.  Do not drive: ? If you have been drinking alcohol. Do not ride with someone who has been drinking. ? When you are tired or distracted. ? While texting.  Wear a helmet and other protective equipment during sports activities.  If you have firearms in your house, make sure you follow all gun safety procedures. What's next?  Visit your health care provider once a year for an annual wellness visit.  Ask your health care provider how often you should have your eyes and teeth checked.  Stay up to date on all vaccines. This information is not intended to replace advice given to you by your health care provider. Make sure you discuss any questions you have with your health care provider. Document Revised: 08/08/2020 Document Reviewed: 08/12/2018 Elsevier Patient Education  2021 [place].   Our clinic's number is [phone]. Please call with questions or concerns about what we discussed today.

## 2021-01-07 NOTE — Progress Notes (Signed)
I have reviewed this visit and agree with the documentation.   

## 2021-01-08 ENCOUNTER — Telehealth: Payer: Self-pay

## 2021-01-08 NOTE — Telephone Encounter (Signed)
Attempted to reach patient. No answer. Phone just kept ringing. Unable to LVM. Will try later toady. Salvatore Marvel, CMA

## 2021-01-08 NOTE — Telephone Encounter (Signed)
-----   Message from [name], MD sent at 01/07/2021  1:47 PM EDT ----- Please contact patient to help schedule f/u for her Lexapro and Remerone. We need to review these meds and adjust as needed.

## 2021-01-16 ENCOUNTER — Telehealth: Payer: Medicare HMO

## 2021-01-22 ENCOUNTER — Ambulatory Visit: Payer: Medicare HMO | Admitting: Family Medicine

## 2021-01-23 ENCOUNTER — Other Ambulatory Visit: Payer: Self-pay | Admitting: Family Medicine

## 2021-02-12 ENCOUNTER — Encounter (HOSPITAL_COMMUNITY): Payer: Self-pay

## 2021-02-13 ENCOUNTER — Ambulatory Visit (INDEPENDENT_AMBULATORY_CARE_PROVIDER_SITE_OTHER): Payer: Medicare HMO | Admitting: Family Medicine

## 2021-02-13 ENCOUNTER — Other Ambulatory Visit: Payer: Self-pay

## 2021-02-13 DIAGNOSIS — Z5320 Procedure and treatment not carried out because of patient's decision for unspecified reasons: Secondary | ICD-10-CM | POA: Insufficient documentation

## 2021-02-13 DIAGNOSIS — Z9119 Patient's noncompliance with other medical treatment and regimen: Secondary | ICD-10-CM

## 2021-02-13 NOTE — Progress Notes (Signed)
    SUBJECTIVE:   CHIEF COMPLAINT / HPI: cough and unable to eat   Patient presents with complaint of cough and inability to eat according to appointment scheduling notes.  Prior to entering the exam room to interview the patient, she left the family medicine center. Patient was observed leaving the exam room by Dr. Owens Shark.      PERTINENT  PMH / PSH:  Atrial fibrillation  WTK,1C Diastolic HF  Goiter   OBJECTIVE:   BP 115/60   Pulse 64   Temp (!) 97.4 F (36.3 C)   Wt 118 lb (53.5 kg)   SpO2 97%   BMI 20.25 kg/m   Exam not completed    ASSESSMENT/PLAN:     Jennifer Foster, MD [place]

## 2021-02-13 NOTE — Assessment & Plan Note (Signed)
Patient left clinic prior to being evaluated.

## 2021-02-18 ENCOUNTER — Other Ambulatory Visit: Payer: Self-pay

## 2021-02-18 ENCOUNTER — Encounter: Payer: Self-pay | Admitting: Family Medicine

## 2021-02-18 ENCOUNTER — Ambulatory Visit (INDEPENDENT_AMBULATORY_CARE_PROVIDER_SITE_OTHER): Payer: Medicare HMO | Admitting: Family Medicine

## 2021-02-18 DIAGNOSIS — R4589 Other symptoms and signs involving emotional state: Secondary | ICD-10-CM

## 2021-02-18 DIAGNOSIS — E042 Nontoxic multinodular goiter: Secondary | ICD-10-CM

## 2021-02-18 DIAGNOSIS — F419 Anxiety disorder, unspecified: Secondary | ICD-10-CM

## 2021-02-18 DIAGNOSIS — I4821 Permanent atrial fibrillation: Secondary | ICD-10-CM | POA: Diagnosis not present

## 2021-02-18 DIAGNOSIS — Z1231 Encounter for screening mammogram for malignant neoplasm of breast: Secondary | ICD-10-CM | POA: Diagnosis not present

## 2021-02-18 DIAGNOSIS — Z122 Encounter for screening for malignant neoplasm of respiratory organs: Secondary | ICD-10-CM | POA: Diagnosis not present

## 2021-02-18 DIAGNOSIS — N1832 Chronic kidney disease, stage 3b: Secondary | ICD-10-CM

## 2021-02-18 DIAGNOSIS — D649 Anemia, unspecified: Secondary | ICD-10-CM

## 2021-02-18 DIAGNOSIS — Z720 Tobacco use: Secondary | ICD-10-CM | POA: Diagnosis not present

## 2021-02-18 DIAGNOSIS — F32A Depression, unspecified: Secondary | ICD-10-CM

## 2021-02-18 DIAGNOSIS — R634 Abnormal weight loss: Secondary | ICD-10-CM

## 2021-02-18 DIAGNOSIS — N184 Chronic kidney disease, stage 4 (severe): Secondary | ICD-10-CM

## 2021-02-18 DIAGNOSIS — Z1239 Encounter for other screening for malignant neoplasm of breast: Secondary | ICD-10-CM | POA: Insufficient documentation

## 2021-02-18 MED ORDER — MIRTAZAPINE 30 MG PO TABS: 30.0000 mg | ORAL_TABLET | Freq: Every day | ORAL | 3 refills | Status: AC

## 2021-02-18 MED ORDER — APIXABAN 5 MG PO TABS: 5.0000 mg | ORAL_TABLET | Freq: Two times a day (BID) | ORAL | 6 refills | Status: AC

## 2021-02-18 NOTE — Patient Instructions (Signed)
Stop one medication: escitalopram. Increase one medicine: mirtazipine take two pills each night to use use up what you have.  I sent a new higher dose prescription so you will only take one of the new pills. I also sent in a prescription for your blood thinner, apixiban, which was not in your med bag You need two xrays: Mammogram and a lung ct because you are a smoker. I will call with the blood test results. See Dr. [name] in 2-3 weeks.

## 2021-02-19 ENCOUNTER — Encounter: Payer: Self-pay | Admitting: Family Medicine

## 2021-02-19 DIAGNOSIS — R4589 Other symptoms and signs involving emotional state: Secondary | ICD-10-CM | POA: Insufficient documentation

## 2021-02-19 LAB — TSH: TSH: 0.439 u[IU]/mL — ABNORMAL LOW (ref 0.450–4.500)

## 2021-02-19 LAB — CBC
Hematocrit: 30.4 % — ABNORMAL LOW (ref 34.0–46.6)
Hemoglobin: 10.2 g/dL — ABNORMAL LOW (ref 11.1–15.9)
MCH: 28.4 pg (ref 26.6–33.0)
MCHC: 33.6 g/dL (ref 31.5–35.7)
MCV: 85 fL (ref 79–97)
Platelets: 280 10*3/uL (ref 150–450)
RBC: 3.59 x10E6/uL — ABNORMAL LOW (ref 3.77–5.28)
RDW: 13.3 % (ref 11.7–15.4)
WBC: 5.1 10*3/uL (ref 3.4–10.8)

## 2021-02-19 LAB — CMP14+EGFR
ALT: 9 IU/L (ref 0–32)
AST: 20 IU/L (ref 0–40)
Albumin/Globulin Ratio: 1 — ABNORMAL LOW (ref 1.2–2.2)
Albumin: 3.2 g/dL — ABNORMAL LOW (ref 3.7–4.7)
Alkaline Phosphatase: 74 IU/L (ref 44–121)
BUN/Creatinine Ratio: 9 — ABNORMAL LOW (ref 12–28)
BUN: 28 mg/dL — ABNORMAL HIGH (ref 8–27)
Bilirubin Total: 0.5 mg/dL (ref 0.0–1.2)
CO2: 20 mmol/L (ref 20–29)
Calcium: 9.5 mg/dL (ref 8.7–10.3)
Chloride: 104 mmol/L (ref 96–106)
Creatinine, Ser: 3.27 mg/dL — ABNORMAL HIGH (ref 0.57–1.00)
Globulin, Total: 3.2 g/dL (ref 1.5–4.5)
Glucose: 90 mg/dL (ref 65–99)
Potassium: 4.2 mmol/L (ref 3.5–5.2)
Sodium: 143 mmol/L (ref 134–144)
Total Protein: 6.4 g/dL (ref 6.0–8.5)
eGFR: 14 mL/min/{1.73_m2} — ABNORMAL LOW (ref >59)

## 2021-02-19 NOTE — Assessment & Plan Note (Signed)
Was on both escitalopram and mirtazapine.  Stop escitalopram and increase mirtazipine.

## 2021-02-19 NOTE — Assessment & Plan Note (Signed)
Creat is stable.  Doubt renal disease is causing sx.

## 2021-02-19 NOTE — Assessment & Plan Note (Signed)
Hyperthyroid could cause weight loss.  Check TSH, which is a hair low but I will not pursue for now.  I do not think borderline hyperthyroid would explain current sx

## 2021-02-19 NOTE — Assessment & Plan Note (Signed)
Recheck CBC which is at her baseline.

## 2021-02-19 NOTE — Progress Notes (Signed)
    SUBJECTIVE:   CHIEF COMPLAINT / HPI:   Patient who has multiple underlying medical problems does not feel well.  Over the last several weeks she has had a poor appetite and has been losing weight.  Comes in for evaluation.  From the medical side, she has no focal symptoms that point me to a specific organ system.  Denies pain, vomiting, change in bowels.  But... At risk for lung cancer, she is a life long smoker.  No recent cT screen Known CKD stage 3b-4.  Uremia is a possibility. No recent mammogram screen.  She is up to date on colon cancer screen. Polypharm - fortunately brings in all her meds, so I am able to do good med rec. Also endorses depressive symptoms.  Of course, it is difficult to sort out given her feeling bad. No SI  Also is supposed to be on eliquis.  Not in her med bag.     OBJECTIVE:   BP 126/68   Pulse 88   Ht 5\' 4"  (1.626 m)   Wt 108 lb 6.4 oz (49.2 kg)   SpO2 97%   BMI 18.61 kg/m   Thin appearing.  Wt loss noted. Lungs clear Cardiac RRR without m or g Abd benign Ext no edema.   Depressed affect in the room.  ASSESSMENT/PLAN:   Weight loss Big diff is between medical and psychiatric.  My plan is multiple labs.  Bring up to date on cancer screening tests and short term follow up.  Tobacco abuse At risk for lung ca  Screening for lung cancer Ordered low dose cT  Multinodular goiter Hyperthyroid could cause weight loss.  Check TSH, which is a hair low but I will not pursue for now.  I do not think borderline hyperthyroid would explain current sx  Chronic anemia Recheck CBC which is at her baseline.  Breast cancer screening Needs mammo for breast cancer screen.  Of course occult malignancy is in the diff dx.  CKD (chronic kidney disease) stage 4, GFR 15-29 ml/min (HCC) Creat is stable.  Doubt renal disease is causing sx.  Depressed affect Was on both escitalopram and mirtazapine.  Stop escitalopram and increase mirtazipine.  Atrial  fibrillation ([place]) I assume she is supposed to be on eliquis and so I restarted.  I am aware of her hx of a GI bleed.     Zenia Resides, MD [place]

## 2021-02-19 NOTE — Assessment & Plan Note (Addendum)
Needs mammo for breast cancer screen.  Of course occult malignancy is in the diff dx.

## 2021-02-19 NOTE — Assessment & Plan Note (Signed)
Ordered low dose cT

## 2021-02-19 NOTE — Assessment & Plan Note (Signed)
I assume she is supposed to be on eliquis and so I restarted.  I am aware of her hx of a GI bleed.

## 2021-02-19 NOTE — Assessment & Plan Note (Signed)
At risk for lung ca

## 2021-02-19 NOTE — Assessment & Plan Note (Signed)
Big diff is between medical and psychiatric.  My plan is multiple labs.  Bring up to date on cancer screening tests and short term follow up.

## 2021-02-20 ENCOUNTER — Other Ambulatory Visit: Payer: Self-pay | Admitting: Family Medicine

## 2021-02-20 ENCOUNTER — Telehealth: Payer: Self-pay | Admitting: Family Medicine

## 2021-02-20 ENCOUNTER — Ambulatory Visit: Payer: Medicare HMO | Admitting: Licensed Clinical Social Worker

## 2021-02-20 DIAGNOSIS — E079 Disorder of thyroid, unspecified: Secondary | ICD-10-CM

## 2021-02-20 DIAGNOSIS — I10 Essential (primary) hypertension: Secondary | ICD-10-CM

## 2021-02-20 DIAGNOSIS — Z113 Encounter for screening for infections with a predominantly sexual mode of transmission: Secondary | ICD-10-CM

## 2021-02-20 DIAGNOSIS — Z114 Encounter for screening for human immunodeficiency virus [HIV]: Secondary | ICD-10-CM

## 2021-02-20 DIAGNOSIS — D649 Anemia, unspecified: Secondary | ICD-10-CM

## 2021-02-20 DIAGNOSIS — R413 Other amnesia: Secondary | ICD-10-CM

## 2021-02-20 DIAGNOSIS — R634 Abnormal weight loss: Secondary | ICD-10-CM

## 2021-02-20 DIAGNOSIS — Z7689 Persons encountering health services in other specified circumstances: Secondary | ICD-10-CM

## 2021-02-20 NOTE — Patient Instructions (Signed)
Visit Information  Your phone appointment with Jennifer Foster has been scheduled for July 12th at 9:45   Patient verbalizes understanding of instructions provided today.   Casimer Lanius, LCSW Care Management & Coordination  [phone]

## 2021-02-20 NOTE — Telephone Encounter (Signed)
  I gave Mrs. Dubow a social call. She was evaluated by my colleague recently for weight loss.   Result was discussed with her husband. He said she is also having memory issues.  Given hx of severe anemia needing blood transfusion in the past and her hemoglobin dropped from 12 to 10, I recommended a repeat CBC this Friday for monitoring.  Will need TFT as well.  Her kidney functions just a bit above her baseline. She is plugged in with a Nephrologist.  Will re-check kidney function.  F/U appointment made with me for four weeks.

## 2021-02-20 NOTE — Chronic Care Management (AMB) (Signed)
    Clinical Social Work  Care Management   Phone Outreach    02/20/2021 Name: Jennifer Foster MRN: [id number] DOB: July 17, 1946  Jennifer Foster is a 75 y.o. year old female who is a primary care patient of [place], Phill Myron, MD .   F/U phone call today to assess needs, and progress with care plan goals for CCM RN.    Assessment: Patient reports she is doing well has no complaints.  Increase in appetite as she was not eating a few months ago. Reports checking BP but not writing it down.  Will work at writing down and recording BP to have during next encounter with CCM RN  Recent life changes [place]: spending time with her 65 yr old granddaughter  Follow up Plan: Patient would like continued follow-up.  LCSW scheduled f/u appointment with CCM RN July 12th Patient will call office if needed prior to next encounter.  Review of patient status, including review of consultants reports, relevant laboratory and other test results, and collaboration with appropriate care team members and the patient's provider was performed as part of comprehensive patient evaluation and provision of care management services.    Casimer Lanius, [place] / [place]   [phone] 10:25 AM

## 2021-02-20 NOTE — Addendum Note (Signed)
Addended by: Zenia Resides on: 02/20/2021 09:36 AM   Modules accepted: Orders

## 2021-02-22 ENCOUNTER — Other Ambulatory Visit: Payer: Medicare HMO

## 2021-02-28 ENCOUNTER — Other Ambulatory Visit: Payer: Medicare HMO

## 2021-03-12 ENCOUNTER — Telehealth: Payer: Medicare HMO

## 2021-03-12 ENCOUNTER — Ambulatory Visit: Payer: Self-pay

## 2021-03-12 DIAGNOSIS — I5032 Chronic diastolic (congestive) heart failure: Secondary | ICD-10-CM

## 2021-03-12 DIAGNOSIS — I10 Essential (primary) hypertension: Secondary | ICD-10-CM

## 2021-03-12 NOTE — Patient Instructions (Signed)
Visit Information   Goals Addressed               This Visit's Progress     Heart Failure (pt-stated)        Timeframe:  Long-Range Goal Priority:  High Start Date:     01/11/20                        Expected End Date: 06/30/21     Follow up date: 04/23/2021      Patient Goals/Self care Activities: Takes Heart Failure Medications as prescribed Verbalizes understanding of and follows CHF Action Plan Adheres to low sodium diet Patient will weigh daily and record values Patient will Call 911 with any symptoms in the HF Red zone  Notes: 03/12/2021: Patient reports no shortness of breath, Chest pain, or swelling.  Patient is inconsistent with weighing herself at home.       Hypertention Goals         Timeframe:  Long-Range Goal Priority:  High Start Date:  07/11/20                           Expected End Date: 06/30/21  Follow up date: 04/23/2021   Patient Goal/Self Care Activities:  Self administers medications as prescribed Attends all scheduled provider appointments Calls provider office for new concerns, questions, or BP outside discussed parameters Checks BP and records as discussed Follows a low sodium diet/DASH diet  agree to work together to make changes ask questions to understand  Notes: 03/12/2021: Patient not checking blood pressure lately because she "forgets".  Encouraged her to check blood pressure at least 2 to 3 times a week and record blood pressure readings.  Patient verbalized understanding.         The patient verbalized understanding of instructions, educational materials, and care plan provided today and declined offer to receive copy of patient instructions, educational materials, and care plan.   Telephone follow up appointment with care management team member scheduled for:04/23/2021 at 10:30 am   [place], [place] Mobile:  [phone]

## 2021-03-12 NOTE — Chronic Care Management (AMB) (Signed)
Care Management    RN Visit Note  03/12/2021 Name: Jennifer Foster MRN: [id number] DOB: Nov 06, 1945  Subjective: Jennifer Foster is a 75 y.o. year old female who is a primary care patient of [name], MD. The care management team was consulted for assistance with disease management and care coordination needs.    Engaged with patient by telephone for follow up visit in response to provider referral for case management and/or care coordination services.   Consent to Services:   Jennifer Foster was given information about Care Management services today including:  Care Management services includes personalized support from designated clinical staff supervised by her physician, including individualized plan of care and coordination with other care providers 24/7 contact phone numbers for assistance for urgent and routine care needs. The patient may stop case management services at any time by phone call to the office staff.  Patient agreed to services and consent obtained.   Assessment: Review of patient past medical history, allergies, medications, health status, including review of consultants reports, laboratory and other test data, was performed as part of comprehensive evaluation and provision of chronic care management services.   SDOH (Social Determinants of Health) assessments and interventions performed:    Care Plan  Allergies  Allergen Reactions   Ace Inhibitors Swelling and Other (See Comments)    Eyes swell   Lisinopril Swelling and Other (See Comments)    Swollen tongue    Tramadol Itching    Outpatient Encounter Medications as of 03/12/2021  Medication Sig Note   albuterol (VENTOLIN HFA) 108 (90 Base) MCG/ACT inhaler Inhale 2 puffs into the lungs every 6 (six) hours as needed for wheezing or shortness of breath.    amLODipine (NORVASC) 10 MG tablet TAKE 1 TABLET(10 MG) BY MOUTH DAILY    apixaban (ELIQUIS) 5 MG TABS tablet Take 1 tablet (5 mg total) by mouth 2 (two)  times daily.    calcitRIOL (ROCALTROL) 0.25 MCG capsule Take 0.25 mcg by mouth daily.     diclofenac Sodium (VOLTAREN) 1 % GEL Apply 4 g topically 4 (four) times daily. Apply to hip joint 10/26/2020: Ran out   famotidine (PEPCID) 20 MG tablet Take 1 tablet (20 mg total) by mouth daily as needed for heartburn or indigestion.    furosemide (LASIX) 20 MG tablet TAKE 1 TABLET(20 MG) BY MOUTH DAILY    hydrALAZINE (APRESOLINE) 25 MG tablet Take 1 tablet (25 mg total) by mouth 3 (three) times daily.    isosorbide mononitrate (IMDUR) 30 MG 24 hr tablet TAKE 1 TABLET(30 MG) BY MOUTH DAILY (Patient taking differently: Take 30 mg by mouth daily.)    mirtazapine (REMERON) 30 MG tablet Take 1 tablet (30 mg total) by mouth at bedtime.    nitroGLYCERIN (NITROSTAT) 0.4 MG SL tablet Place 1 tablet (0.4 mg total) under the tongue every 5 (five) minutes as needed for chest pain. If do not resolve after 1 tablet, go to ED    potassium chloride SA (KLOR-CON) 20 MEQ tablet TAKE 1/2 TABLET BY MOUTH EVERY DAY    rosuvastatin (CRESTOR) 20 MG tablet TAKE 1 TABLET(20 MG) BY MOUTH AT BEDTIME    No facility-administered encounter medications on file as of 03/12/2021.    Patient Active Problem List   Diagnosis Date Noted   Depressed affect 02/19/2021   Screening for lung cancer 02/18/2021   Breast cancer screening 02/18/2021   Abnormal echocardiogram 11/02/2020   Splenic infarct    Sciatica of right side without  back pain 07/12/2020   Acquired thrombophilia ([place]) 01/24/2020   History of cocaine abuse ([place]) 10/03/2019   Upper GI bleed 09/04/2019   Chronic diastolic CHF (congestive heart failure) ([place]) 03/23/2018   Aortic atherosclerosis ([place]) 01/01/2018   CAD (coronary artery disease) 11/27/2017   Atrial fibrillation ([place]) 10/13/2017   Multinodular goiter 04/14/2017   Adrenal adenoma 04/14/2017   Thoracic aortic aneurysm without rupture ([place]) 04/03/2017   Weight loss 01/30/2017   Anxiety and depression 12/25/2015    CKD (chronic kidney disease) stage 4, GFR 15-29 ml/min ([place]) 06/27/2015   Essential hypertension    Gout 02/28/2014   Lipoma 03/19/2011   Hyperlipidemia 01/01/2011   Chronic anemia 11/23/2009   Tobacco abuse 09/03/2009    Conditions to be addressed/monitored: CHF and HTN  Care Plan : RN Case Manager  Updates made by Inge Rise, RN since 03/12/2021 12:00 AM     Problem: (Heart Failure)      Long-Range Goal: Patient will weigh daily and monitor syptoms   Start Date: 01/11/2020  Expected End Date: 06/30/2021  This Visit's Progress: On track  Priority: High  Note:    Current Barriers:  Knowledge deficit related to basic heart failure pathophysiology and self care management  Case Manager Clinical Goal(s):  Over the next 90 days, patient will weigh self daily and record  Interventions:  Basic overview and discussion of  Heart Failure reviewed Provided written education on low sodium diet Provided verbal education on low sodium diet Reviewed Heart Failure Action Plan in depth and provided written copy Provided education about placing scale on hard, flat surface Advised patient to weigh each morning after emptying bladder Discussed importance of daily weight and advised patient to weigh and record daily medication-adherence assessment completed-she reports that she is taking her medications as prescribed.  03/12/2021: Continues to be compliant with medications. self-awareness of signs/symptoms of worsening disease encouraged Patient reports she has lost some weight.  Her grandson recently passed and they laid him to rest on Friday. She states that she is able to eat and had a good breakfast this morning.  She denies CP shortness of breath and swelling. RNCM offered patient connection with LCSW for counseling services she declined.  She stated that she will get back with me if it is needed. 03/12/2021: Patient reports no shortness of breath, chest pain, or swelling.  Patient  is inconsistent with weighing herself at home.  Does not weight herself daily. Reviewed the importance of weighing herself daily and report a 2 lb weight gain in 24 hr period or 5 lbs in one week.  Patient states her appetite has improved and eating "fair or good" lately since she was losing weight recently. Patient stated she was "not feeling good this morning because of her stomach" but says she will be feel better later.  Patient Goals/Self Care Activities:  Takes Heart Failure Medications as prescribed Verbalizes understanding of and follows CHF Action Plan Adheres to low sodium diet     Care Plan : RN Case Manager  Updates made by Inge Rise, RN since 03/12/2021 12:00 AM     Problem: Hypertension (Hypertension)      Long-Range Goal: Hypertension Monitored   Start Date: 07/11/2020  Expected End Date: 06/30/2021  This Visit's Progress: On track  Recent Progress: Not on track  Priority: High  Note:   Current Barriers:  Knowledge Deficits related to basic understanding of hypertension pathophysiology and self care management  Nurse Case Manager Clinical Goal(s):  Over the next 30 days, patient will verbalize understanding of plan for hypertension management Over the next 90 days, patient will demonstrate improved adherence to prescribed treatment plan for hypertension as evidenced by taking all medications as prescribed, monitoring and recording blood pressure as directed, adhering to low sodium/DASH diet  Interventions:  Evaluation of current treatment plan related to hypertension self management and patient's adherence to plan as established by provider. Next PCP appointment on 03/19/2021. Provided education to patient re: stroke prevention, s/s of heart attack and stroke, DASH diet, complications of uncontrolled blood pressure Reviewed medications with patient and discussed importance of compliance Discussed plans with patient for ongoing care management follow up and  provided patient with direct contact information for care management team Advised patient, providing education and rationale, to monitor blood pressure daily and record, calling PCP for findings outside established parameters.  medication list reviewed understanding of current medications assessed Patient reports that she has not checked her BP and could not give me any values .  Patient denies any symptoms of HTN.  RNCM advised of of the importance of checking her BP.  03/12/2021: Patient continues to not check blood pressure.  Patient states she has not checked blood pressure lately because she "forgets".  Encouraged her to check blood pressure at least 2 to 3 times a week and record blood pressure readings.  Patient verbalized understanding.  Patient Goal/Self Care Activities:  Self administers medications as prescribed Attends all scheduled provider appointments Calls provider office for new concerns, questions, or BP outside discussed parameters Checks BP and records as discussed Follows a low sodium diet/DASH diet Check  BP 3 times / week and record values       Plan: Telephone follow up appointment with care management team member scheduled for:  04/23/2021 at 10:30 am  [place], [place] Mobile: [phone]

## 2021-03-19 ENCOUNTER — Encounter: Payer: Self-pay | Admitting: Family Medicine

## 2021-03-19 ENCOUNTER — Other Ambulatory Visit: Payer: Self-pay

## 2021-03-19 ENCOUNTER — Ambulatory Visit (INDEPENDENT_AMBULATORY_CARE_PROVIDER_SITE_OTHER): Payer: Medicare HMO | Admitting: Family Medicine

## 2021-03-19 VITALS — BP 128/65 | HR 57 | Ht 64.0 in | Wt 114.0 lb

## 2021-03-19 DIAGNOSIS — I712 Thoracic aortic aneurysm, without rupture, unspecified: Secondary | ICD-10-CM

## 2021-03-19 DIAGNOSIS — Z113 Encounter for screening for infections with a predominantly sexual mode of transmission: Secondary | ICD-10-CM | POA: Diagnosis not present

## 2021-03-19 DIAGNOSIS — I700 Atherosclerosis of aorta: Secondary | ICD-10-CM | POA: Diagnosis not present

## 2021-03-19 DIAGNOSIS — R634 Abnormal weight loss: Secondary | ICD-10-CM

## 2021-03-19 DIAGNOSIS — Z1231 Encounter for screening mammogram for malignant neoplasm of breast: Secondary | ICD-10-CM | POA: Diagnosis not present

## 2021-03-19 DIAGNOSIS — I5032 Chronic diastolic (congestive) heart failure: Secondary | ICD-10-CM | POA: Diagnosis not present

## 2021-03-19 DIAGNOSIS — Z114 Encounter for screening for human immunodeficiency virus [HIV]: Secondary | ICD-10-CM | POA: Diagnosis not present

## 2021-03-19 DIAGNOSIS — R7309 Other abnormal glucose: Secondary | ICD-10-CM

## 2021-03-19 DIAGNOSIS — I10 Essential (primary) hypertension: Secondary | ICD-10-CM

## 2021-03-19 DIAGNOSIS — D6869 Other thrombophilia: Secondary | ICD-10-CM

## 2021-03-19 LAB — POCT GLYCOSYLATED HEMOGLOBIN (HGB A1C): Hemoglobin A1C: 4.1 % (ref 4.0–5.6)

## 2021-03-19 NOTE — Patient Instructions (Signed)

## 2021-03-19 NOTE — Progress Notes (Addendum)
     SUBJECTIVE:   CHIEF COMPLAINT / HPI:   Weight loss:  She is here for her follow-up. The patient eats a light breakfast and will not eat during lunch time. She also eats a light meal for dinner. Her appetite has been poor since her grandson passed away a few months ago, which gives her a lot of stress. She drinks a bottle of Ensure daily. She denies GI symptoms, no blood in her stool, cough, chest pain, or SOB. She feels well otherwise.  PERTINENT  PMH / PSH: pmx reviewed  OBJECTIVE:   Vitals:   03/19/21 0842  BP: 128/65  Pulse: (!) 57  SpO2: 99%  Weight: 114 lb (51.7 kg)  Height: 5\' 4"  (1.626 m)   Physical Exam Vitals and nursing note reviewed.  Neck:     Comments: No thyromegaly Cardiovascular:     Rate and Rhythm: Normal rate. Rhythm irregular.     Heart sounds: Normal heart sounds. No murmur heard. Pulmonary:     Effort: Pulmonary effort is normal. No respiratory distress.     Breath sounds: Normal breath sounds. No wheezing.  Abdominal:     General: Abdomen is flat. Bowel sounds are normal. There is no distension.     Palpations: Abdomen is soft. There is no mass.     Tenderness: There is no abdominal tenderness.  Musculoskeletal:     Right lower leg: No edema.     Left lower leg: No edema.    [facility] Office Visit from 03/19/2021 in [facility]  PHQ-9 Total Score 0       ASSESSMENT/PLAN:   Weight loss Gained 6 lbs since her last visit. There is an element of poor nutrition intake. Continue Remeron for appetite stimulation. Mammogram slip given and was ordered. She is up to date with colon cancer and lung cancer screen. I checked her TFT, HIV, RPR, Bmet and Anemia panel today. A1C <5 I encouraged her to take her Ensure BID. F/U in 2 weeks for weight monitoring. She agreed with the plan.   Thoracic aortic aneurysm without rupture ([name]) Yearly MRI due in Sept 2022  Aortic atherosclerosis (HCC) Continue Statin. No  change from her baseline.   Chronic diastolic CHF (congestive heart failure) (HCC) No acute change  Acquired thrombophilia ([name]) Compliant with Eliquis for Afib   Covid booster dose discussed. CMA aware to vaccinate today.  Jennifer Mews, MD [place]

## 2021-03-19 NOTE — Assessment & Plan Note (Signed)
No acute change

## 2021-03-19 NOTE — Assessment & Plan Note (Signed)
Yearly MRI due in Sept 2022

## 2021-03-19 NOTE — Assessment & Plan Note (Signed)
Continue Statin. No change from her baseline.

## 2021-03-19 NOTE — Assessment & Plan Note (Addendum)
Gained 6 lbs since her last visit. There is an element of poor nutrition intake. Continue Remeron for appetite stimulation. Mammogram slip given and was ordered. She is up to date with colon cancer and lung cancer screen. I checked her TFT, HIV, RPR, Bmet and Anemia panel today. A1C <5 I encouraged her to take her Ensure BID. F/U in 2 weeks for weight monitoring. She agreed with the plan.

## 2021-03-19 NOTE — Assessment & Plan Note (Addendum)
Compliant with Eliquis for Afib

## 2021-03-20 ENCOUNTER — Telehealth: Payer: Self-pay | Admitting: Family Medicine

## 2021-03-20 NOTE — Telephone Encounter (Signed)
I called and discussed her test results.  Hemoglobin remains at baseline. Kidney functions worsen. She is currently asymptomatic, and she has nephro appointment on 7/25. I advised her to review the result with her nephrologist, and she agreed with the plan.  Ferritin level is high. It could be due to inflammatory conditions/autoimmune, blood malignancy, hyperthyroidism, or Statin intake. As discussed with her, we will proceed with additional testing at her follow-up appointment with me in 2 weeks to include serum electrophoresis for multiple myeloma. She agreed with the plan. I offered to schedule her f/u appointment, but she prefers to call later to schedule.  TFT is still pending.

## 2021-03-24 ENCOUNTER — Other Ambulatory Visit: Payer: Self-pay | Admitting: Family Medicine

## 2021-03-24 ENCOUNTER — Other Ambulatory Visit: Payer: Self-pay | Admitting: Physician Assistant

## 2021-03-25 ENCOUNTER — Other Ambulatory Visit: Payer: Self-pay | Admitting: Family Medicine

## 2021-03-25 LAB — BASIC METABOLIC PANEL
BUN/Creatinine Ratio: 7 — ABNORMAL LOW (ref 12–28)
BUN: 35 mg/dL — ABNORMAL HIGH (ref 8–27)
CO2: 17 mmol/L — ABNORMAL LOW (ref 20–29)
Calcium: 8.8 mg/dL (ref 8.7–10.3)
Chloride: 102 mmol/L (ref 96–106)
Creatinine, Ser: 4.81 mg/dL — ABNORMAL HIGH (ref 0.57–1.00)
Glucose: 94 mg/dL (ref 65–99)
Potassium: 4 mmol/L (ref 3.5–5.2)
Sodium: 135 mmol/L (ref 134–144)
eGFR: 9 mL/min/{1.73_m2} — ABNORMAL LOW (ref >59)

## 2021-03-25 LAB — ANEMIA PROFILE B
Basophils Absolute: 0 10*3/uL (ref 0.0–0.2)
Basos: 1 %
EOS (ABSOLUTE): 0.1 10*3/uL (ref 0.0–0.4)
Eos: 2 %
Ferritin: 286 ng/mL — ABNORMAL HIGH (ref 15–150)
Folate: 5.9 ng/mL (ref >3.0)
Hematocrit: 30.5 % — ABNORMAL LOW (ref 34.0–46.6)
Hemoglobin: 10.1 g/dL — ABNORMAL LOW (ref 11.1–15.9)
Immature Grans (Abs): 0 10*3/uL (ref 0.0–0.1)
Immature Granulocytes: 0 %
Iron Saturation: 40 % (ref 15–55)
Iron: 59 ug/dL (ref 27–139)
Lymphocytes Absolute: 0.9 10*3/uL (ref 0.7–3.1)
Lymphs: 25 %
MCH: 29.4 pg (ref 26.6–33.0)
MCHC: 33.1 g/dL (ref 31.5–35.7)
MCV: 89 fL (ref 79–97)
Monocytes Absolute: 0.5 10*3/uL (ref 0.1–0.9)
Monocytes: 13 %
Neutrophils Absolute: 2.1 10*3/uL (ref 1.4–7.0)
Neutrophils: 59 %
Platelets: 200 10*3/uL (ref 150–450)
RBC: 3.44 x10E6/uL — ABNORMAL LOW (ref 3.77–5.28)
RDW: 13.8 % (ref 11.7–15.4)
Retic Ct Pct: 2.6 % (ref 0.6–2.6)
Total Iron Binding Capacity: 149 ug/dL — ABNORMAL LOW (ref 250–450)
UIBC: 90 ug/dL — ABNORMAL LOW (ref 118–369)
Vitamin B-12: 726 pg/mL (ref 232–1245)
WBC: 3.5 10*3/uL (ref 3.4–10.8)

## 2021-03-25 LAB — HIV ANTIBODY (ROUTINE TESTING W REFLEX): HIV Screen 4th Generation wRfx: NONREACTIVE

## 2021-03-25 LAB — TSH+T3+FREE T4+T3 FREE
Free T-3: 2 pg/mL
Free T4 by Dialysis: 1.2 ng/dL
TSH: 0.91 uU/mL
Triiodothyronine (T-3), Serum: 62 ng/dL

## 2021-03-25 LAB — RPR: RPR Ser Ql: NONREACTIVE

## 2021-03-25 NOTE — Telephone Encounter (Signed)
Medication was discontinued

## 2021-04-03 DIAGNOSIS — N184 Chronic kidney disease, stage 4 (severe): Secondary | ICD-10-CM | POA: Diagnosis not present

## 2021-04-03 DIAGNOSIS — N2581 Secondary hyperparathyroidism of renal origin: Secondary | ICD-10-CM | POA: Diagnosis not present

## 2021-04-03 DIAGNOSIS — I129 Hypertensive chronic kidney disease with stage 1 through stage 4 chronic kidney disease, or unspecified chronic kidney disease: Secondary | ICD-10-CM | POA: Diagnosis not present

## 2021-04-03 DIAGNOSIS — D631 Anemia in chronic kidney disease: Secondary | ICD-10-CM | POA: Diagnosis not present

## 2021-04-19 ENCOUNTER — Encounter: Payer: Self-pay | Admitting: Family Medicine

## 2021-04-19 ENCOUNTER — Ambulatory Visit (INDEPENDENT_AMBULATORY_CARE_PROVIDER_SITE_OTHER): Payer: Medicare HMO | Admitting: Family Medicine

## 2021-04-19 ENCOUNTER — Other Ambulatory Visit: Payer: Self-pay

## 2021-04-19 ENCOUNTER — Ambulatory Visit (INDEPENDENT_AMBULATORY_CARE_PROVIDER_SITE_OTHER): Payer: Medicare HMO

## 2021-04-19 VITALS — BP 130/70 | HR 74 | Ht 64.0 in | Wt 109.5 lb

## 2021-04-19 DIAGNOSIS — D649 Anemia, unspecified: Secondary | ICD-10-CM | POA: Diagnosis not present

## 2021-04-19 DIAGNOSIS — R5383 Other fatigue: Secondary | ICD-10-CM | POA: Diagnosis not present

## 2021-04-19 DIAGNOSIS — Z23 Encounter for immunization: Secondary | ICD-10-CM

## 2021-04-19 DIAGNOSIS — Z1231 Encounter for screening mammogram for malignant neoplasm of breast: Secondary | ICD-10-CM | POA: Diagnosis not present

## 2021-04-19 DIAGNOSIS — R634 Abnormal weight loss: Secondary | ICD-10-CM

## 2021-04-19 MED ORDER — ZOSTER VAC RECOMB ADJUVANTED 50 MCG/0.5ML IM SUSR: 0.5000 mL | Freq: Once | INTRAMUSCULAR | 1 refills | Status: AC

## 2021-04-19 NOTE — Progress Notes (Signed)
    SUBJECTIVE:   CHIEF COMPLAINT / HPI:   Fatigue/Weight loss: Here for follow-up. She continues to lose weight. Endorses fatigue. She denies GU symptoms and has no blood in her stool. She drinks a bottle of Ensure per day to supplement her daily meals. No other concerns today.  PERTINENT  PMH / PSH: PMX reviewed  OBJECTIVE:   BP 130/70   Pulse 74   Ht $R'5\' 4"'gL$  (1.626 m)   Wt 109 lb 8 oz (49.7 kg)   SpO2 96%   BMI 18.80 kg/m   Physical Exam Vitals and nursing note reviewed.  Constitutional:      Appearance: She is not ill-appearing.     Comments: Mildly cachectic   Cardiovascular:     Rate and Rhythm: Normal rate and regular rhythm.     Pulses: Normal pulses.     Heart sounds: Normal heart sounds. No murmur heard. Pulmonary:     Effort: Pulmonary effort is normal. No respiratory distress.     Breath sounds: Normal breath sounds. No wheezing.  Abdominal:     Palpations: Abdomen is soft.     ASSESSMENT/PLAN:   Weight loss Weight loss with fatigue. ?? Nutritional. Cancer screening negative so far. Mammogram recommended. Scheduling slip provided and mammogram was ordered. She agreed to schedule her appointment. Thyroid function test normal. Given chronic anemia and weight loss, I would like too assess for multiple myeloma. Protein electrophoresis and Microglobulin was ordered. Nutritionist referral recommended but she declined. I advised she increases her ensure to BID/TID with each meals. She agreed with the plan.    Breast cancer screening: Mammogram discussed and was ordered. She will schedule her appointment.   COVID-19 booster shot given today Shingrix discussed and she agreed to schedule vaccination with her pharmacy. I also escribed shingrix to her pharmacy.   Andrena Mews, MD [place]

## 2021-04-19 NOTE — Patient Instructions (Signed)
Zoster Vaccine, Recombinant injection What is this medication? ZOSTER VACCINE (ZOS ter vak SEEN) is a vaccine used to reduce the risk of getting shingles. This vaccine is not used to treat shingles or nerve pain fromshingles. This medicine may be used for other purposes; ask your health care provider orpharmacist if you have questions. COMMON BRAND NAME(S): [facility] What should I tell my care team before I take this medication? They need to know if you have any of these conditions: cancer immune system problems an unusual or allergic reaction to Zoster vaccine, other medications, foods, dyes, or preservatives pregnant or trying to get pregnant breast-feeding How should I use this medication? This vaccine is injected into a muscle. It is given by a health care provider. A copy of Vaccine Information Statements will be given before each vaccination. Be sure to read this information carefully each time. This sheet may changeoften. Talk to your health care provider about the use of this vaccine in children.This vaccine is not approved for use in children. Overdosage: If you think you have taken too much of this medicine contact apoison control center or emergency room at once. NOTE: This medicine is only for you. Do not share this medicine with others. What if I miss a dose? Keep appointments for follow-up (booster) doses. It is important not to miss your dose. Call your health care provider if you are unable to keep anappointment. What may interact with this medication? medicines that suppress your immune system medicines to treat cancer steroid medicines like prednisone or cortisone This list may not describe all possible interactions. Give your health care provider a list of all the medicines, herbs, non-prescription drugs, or dietary supplements you use. Also tell them if you smoke, drink alcohol, or use illegaldrugs. Some items may interact with your medicine. What should I watch for while  using this medication? Visit your health care provider regularly. This vaccine, like all vaccines, may not fully protect everyone. What side effects may I notice from receiving this medication? Side effects that you should report to your doctor or health care professionalas soon as possible: allergic reactions (skin rash, itching or hives; swelling of the face, lips, or tongue) trouble breathing Side effects that usually do not require medical attention (report these toyour doctor or health care professional if they continue or are bothersome): chills headache fever nausea pain, redness, or irritation at site where injected tiredness vomiting This list may not describe all possible side effects. Call your doctor for medical advice about side effects. You may report side effects to FDA at1-800-FDA-1088. Where should I keep my medication? This vaccine is only given by a health care provider. It will not be stored athome. NOTE: This sheet is a summary. It may not cover all possible information. If you have questions about this medicine, talk to your doctor, pharmacist, orhealth care provider.  2022 Elsevier/Gold Standard (2019-09-23 16:23:07)

## 2021-04-19 NOTE — Assessment & Plan Note (Signed)
Weight loss with fatigue. ?? Nutritional. Cancer screening negative so far. Mammogram recommended. Scheduling slip provided and mammogram was ordered. She agreed to schedule her appointment. Thyroid function test normal. Given chronic anemia and weight loss, I would like too assess for multiple myeloma. Protein electrophoresis and Microglobulin was ordered. Nutritionist referral recommended but she declined. I advised she increases her ensure to BID/TID with each meals. She agreed with the plan.

## 2021-04-23 ENCOUNTER — Telehealth: Payer: Medicare HMO

## 2021-04-23 ENCOUNTER — Telehealth: Payer: Self-pay

## 2021-04-23 NOTE — Telephone Encounter (Signed)
   RN Case Manager Care Management   Phone Outreach    04/23/2021 Name: Jennifer Foster MRN: [id number] DOB: 05/15/1946  Jennifer Foster is a 75 y.o. year old female who is a primary care patient of [name], MD .   Telephone outreach was unsuccessful Unable to leave a HIPPA compliant phone message due to voice mail not set up.  Plan:Will route chart to Care Guide to see if patient would like to reschedule phone appointment   Review of patient status, including review of consultants reports, relevant laboratory and other test results, and collaboration with appropriate care team members and the patient's provider was performed as part of comprehensive patient evaluation and provision of care management services.    Lazaro Arms RN, BSN, [facility] Care Management Coordinator [place] Phone: [phone] I Fax: [phone]

## 2021-04-24 ENCOUNTER — Telehealth: Payer: Self-pay | Admitting: Nurse Practitioner

## 2021-04-24 ENCOUNTER — Telehealth: Payer: Self-pay | Admitting: Family Medicine

## 2021-04-24 DIAGNOSIS — R634 Abnormal weight loss: Secondary | ICD-10-CM

## 2021-04-24 DIAGNOSIS — R778 Other specified abnormalities of plasma proteins: Secondary | ICD-10-CM

## 2021-04-24 LAB — PROTEIN ELECTROPHORESIS, SERUM
A/G Ratio: 0.9 (ref 0.7–1.7)
Albumin ELP: 3 g/dL (ref 2.9–4.4)
Alpha 1: 0.2 g/dL (ref 0.0–0.4)
Alpha 2: 0.7 g/dL (ref 0.4–1.0)
Beta: 1.1 g/dL (ref 0.7–1.3)
Gamma Globulin: 1.1 g/dL (ref 0.4–1.8)
Globulin, Total: 3.2 g/dL (ref 2.2–3.9)
M-Spike, %: 0.3 g/dL — ABNORMAL HIGH
Total Protein: 6.2 g/dL (ref 6.0–8.5)

## 2021-04-24 LAB — BETA 2 MICROGLOBULIN, SERUM: Beta-2: 11.4 mg/L — ABNORMAL HIGH (ref 0.6–2.4)

## 2021-04-24 NOTE — Telephone Encounter (Signed)
Scheduled appt per referral and RN Manuela Schwartz. Pt aware of appt date and time. Pt also aware of appt location.

## 2021-04-24 NOTE — Telephone Encounter (Signed)
Abnormal protein electrophoresis discussed. Given weight loss of unknown origin, I discussed referral to oncology for further assessment. She agreed with the plan.  Referral order placed.

## 2021-04-25 ENCOUNTER — Encounter: Payer: Self-pay | Admitting: *Deleted

## 2021-04-25 ENCOUNTER — Telehealth: Payer: Self-pay | Admitting: *Deleted

## 2021-04-25 NOTE — Progress Notes (Deleted)
New Hematology/Oncology Consult   Requesting MD: Dr. Andrena Mews  [phone]  Reason for Consult: Unexplained weight loss and abnormal SPEP  HPI: Jennifer Foster is a 75 year old woman with asthma, atrial fibrillation, CAD, chronic diastolic CHF, chronic kidney disease, hypertension referred for evaluation of unexplained weight loss and abnormal SPEP.  Labs done 04/19/2021 showed serum protein electrophoresis with M spike 0.3% with a faint band in beta region suspicious for monoclonal immunoglobulin, beta-2 microglobulin elevated at 11.4.  Additional labs done 03/19/2021 showed hemoglobin 10.1, white count 3.5, platelet count 200,000, creatinine 4.81, calcium 8.8.  Review of weights in the EMR show she weighed 188 pounds on 02/20/2015, 160 pounds 03/18/2016, 147 pounds on 03/31/2017, 160 pounds 03/19/2018, 151 pounds on 05/31/2019, 141 pounds on 12/06/2019, 128 pounds on 01/24/2020, 117 pounds on 10/27/2020, 118 pounds on 11/02/2020, 108 pounds on 02/18/2021, 114 pounds 03/19/2021 and 109 pounds on 04/19/2021.  Past Medical History:  Diagnosis Date   Acute on chronic blood loss anemia 06/26/2015   Acute respiratory failure with hypoxia ([place]) 02/21/2018   Aortic atherosclerosis ([place]) 01/01/2018   CT 04/2017   Asthma    Atrial fibrillation ([place]) 10/13/2017   Newly diagnosed in Jan 2019 // Apixaban for anticoag // Apixaban held in 2019 for anemia but resumed; Hgb stable since restarting   CAD (coronary artery disease) 11/27/2017   Nuc stress 3/19: anterior ischemia, ?inf scar, EF 50, intermediate risk // LHC 4/19: LAD mild dz, pLCx 35, OM2 30, mRCA 60, dRCA 65, LVEDP 15-20   Chronic diastolic CHF (congestive heart failure) ([place]) 03/23/2018   Echo 2/19: Moderate LVH, EF 09-81, grade 2 diastolic dysfunction, trivial MR, moderate to severe LAE, PASP 30//Echocardiogram 7/21: EF 55-60, severe LVH, severe LAE, mod RAE, mild MR, mild dilation of Asc aorta (40 mm), RVSP 27.6    Chronic kidney disease (CKD), stage III  (moderate) ([place]) 06/27/2015   DIVERTICULAR BLEEDING, HX OF 10/14/2007   Colonoscopy 2008 showed diverticulosis.    GERD (gastroesophageal reflux disease)    Gout    Hyperlipemia    Hypertension    Hypertensive urgency 10/25/2020   Proteinuria 01/11/2016   Thoracic aortic aneurysm 04/03/2017   CT 8/18: ascending thoracic aorta 4.1 cm // unable to do CTA due to CKD // Chest MRA 05/2019: Ascending thoracic aorta 40 mm  :   Past Surgical History:  Procedure Laterality Date   ABDOMINAL HYSTERECTOMY  1981   partial, per pt history   BIOPSY  09/05/2019   Procedure: BIOPSY;  Surgeon: Ronnette Juniper, MD;  Location: [facility];  Service: Gastroenterology;;   COLONOSCOPY N/A 12/08/2013   Procedure: COLONOSCOPY;  Surgeon: [name], MD;  Location: [place];  Service: Endoscopy;  Laterality: N/A;   COLONOSCOPY N/A 06/28/2015   Procedure: COLONOSCOPY;  Surgeon: Clarene Essex, MD;  Location: [facility] ENDOSCOPY;  Service: Endoscopy;  Laterality: N/A;   ESOPHAGOGASTRODUODENOSCOPY N/A 12/08/2013   Procedure: ESOPHAGOGASTRODUODENOSCOPY (EGD);  Surgeon: [name], MD;  Location: [facility] ENDOSCOPY;  Service: Endoscopy;  Laterality: N/A;   ESOPHAGOGASTRODUODENOSCOPY (EGD) WITH PROPOFOL N/A 09/05/2019   Procedure: ESOPHAGOGASTRODUODENOSCOPY (EGD) WITH PROPOFOL;  Surgeon: Ronnette Juniper, MD;  Location: [facility] ENDOSCOPY;  Service: Gastroenterology;  Laterality: N/A;   GIVENS CAPSULE STUDY N/A 12/08/2013   Procedure: GIVENS CAPSULE STUDY;  Surgeon: [name], MD;  Location: [place];  Service: Endoscopy;  Laterality: N/A;   LEFT HEART CATH AND CORONARY ANGIOGRAPHY N/A 12/03/2017   Procedure: LEFT HEART CATH AND CORONARY ANGIOGRAPHY;  Surgeon: Nelva Bush, MD;  Location:  [facility] INVASIVE CV LAB;  Service: Cardiovascular;  Laterality: N/A;   LIPOMA EXCISION  01/28/11   neck  :   Current Outpatient Medications:    albuterol (VENTOLIN HFA) 108 (90 Base) MCG/ACT inhaler, Inhale 2 puffs into the lungs every 6 (six) hours as  needed for wheezing or shortness of breath. (Patient not taking: No sig reported), Disp: 18 g, Rfl: 3   amLODipine (NORVASC) 10 MG tablet, TAKE 1 TABLET(10 MG) BY MOUTH DAILY, Disp: 90 tablet, Rfl: 1   apixaban (ELIQUIS) 5 MG TABS tablet, Take 1 tablet (5 mg total) by mouth 2 (two) times daily., Disp: 60 tablet, Rfl: 6   calcitRIOL (ROCALTROL) 0.25 MCG capsule, Take 0.25 mcg by mouth daily. , Disp: , Rfl:    diclofenac Sodium (VOLTAREN) 1 % GEL, Apply 4 g topically 4 (four) times daily. Apply to hip joint (Patient not taking: No sig reported), Disp: 50 g, Rfl: 1   famotidine (PEPCID) 20 MG tablet, Take 1 tablet (20 mg total) by mouth daily as needed for heartburn or indigestion., Disp: 90 tablet, Rfl: 1   furosemide (LASIX) 20 MG tablet, TAKE 1 TABLET(20 MG) BY MOUTH DAILY, Disp: 90 tablet, Rfl: 1   hydrALAZINE (APRESOLINE) 25 MG tablet, Take 1 tablet (25 mg total) by mouth 3 (three) times daily., Disp: 270 tablet, Rfl: 3   isosorbide mononitrate (IMDUR) 30 MG 24 hr tablet, TAKE 1 TABLET(30 MG) BY MOUTH DAILY (Patient taking differently: Take 30 mg by mouth daily.), Disp: 90 tablet, Rfl: 2   mirtazapine (REMERON) 30 MG tablet, Take 1 tablet (30 mg total) by mouth at bedtime., Disp: 90 tablet, Rfl: 3   nitroGLYCERIN (NITROSTAT) 0.4 MG SL tablet, Place 1 tablet (0.4 mg total) under the tongue every 5 (five) minutes as needed for chest pain. If do not resolve after 1 tablet, go to ED (Patient not taking: No sig reported), Disp: 10 tablet, Rfl: 0   potassium chloride SA (KLOR-CON) 20 MEQ tablet, Take 0.5 tablets (10 mEq total) by mouth daily. Patient needs appointment for any future refills. Please call office at [phone] to schedule appointment. 1st attempt., Disp: 45 tablet, Rfl: 0   rosuvastatin (CRESTOR) 20 MG tablet, TAKE 1 TABLET(20 MG) BY MOUTH AT BEDTIME, Disp: 90 tablet, Rfl: 1:    Allergies  Allergen Reactions   Ace Inhibitors Swelling and Other (See Comments)    Eyes swell   Lisinopril  Swelling and Other (See Comments)    Swollen tongue    Tramadol Itching  :  FH:  SOCIAL HISTORY:  Review of Systems:   Physical Exam:  There were no vitals taken for this visit.  HEENT: *** Lungs: *** Cardiac: *** Abdomen: *** GU: ***  Vascular: *** Lymph nodes: *** Neurologic: *** Skin: *** Musculoskeletal: ***  LABS:  No results for input(s): WBC, HGB, HCT, PLT in the last 72 hours.  No results for input(s): NA, K, CL, CO2, GLUCOSE, BUN, CREATININE, CALCIUM in the last 72 hours.    RADIOLOGY:  No results found.  Assessment and Plan:   ***    Ned Card, NP 04/25/2021, 3:48 PM

## 2021-04-25 NOTE — Telephone Encounter (Signed)
Rescheduled

## 2021-04-25 NOTE — Telephone Encounter (Signed)
Called patient to confirm new patient appointment on 8/26 at 2:30 w/arrival at 2:15 pm. Informed patient of office location, parking, visitor and mask policy and that visit will last ~ 1 hour. Suggested she bring sweater as well.

## 2021-04-26 ENCOUNTER — Inpatient Hospital Stay: Payer: Medicare HMO | Attending: Nurse Practitioner | Admitting: Nurse Practitioner

## 2021-04-26 ENCOUNTER — Other Ambulatory Visit: Payer: Self-pay | Admitting: Family Medicine

## 2021-04-29 ENCOUNTER — Telehealth: Payer: Self-pay | Admitting: Nurse Practitioner

## 2021-04-29 ENCOUNTER — Telehealth: Payer: Self-pay | Admitting: *Deleted

## 2021-04-29 NOTE — Telephone Encounter (Signed)
Tried calling patient and only get a fast busy.  I have mailed her a copy of the calendar for her appointment as well

## 2021-05-01 ENCOUNTER — Telehealth: Payer: Medicare HMO

## 2021-05-01 ENCOUNTER — Telehealth: Payer: Self-pay

## 2021-05-01 NOTE — Telephone Encounter (Signed)
   RN Case Manager Care Management   Phone Outreach    05/01/2021 Name: Jennifer Foster MRN: [id number] DOB: 06/17/1946  [name] is a 75 y.o. year old female who is a primary care patient of [name], MD .   2nd unsuccessful telephone outreach attempt.  If unable to reach patient by phone on the 3rd attempt, will discontinue outreach calls but will be available at any time to provide services.   Plan:Will route chart to Care Guide to see if patient would like to reschedule phone appointment   Review of patient status, including review of consultants reports, relevant laboratory and other test results, and collaboration with appropriate care team members and the patient's provider was performed as part of comprehensive patient evaluation and provision of care management services.    Lazaro Arms RN, BSN, [facility] Care Management Coordinator [place] Phone: [phone] I Fax: [phone]

## 2021-05-13 ENCOUNTER — Other Ambulatory Visit: Payer: Self-pay | Admitting: Family Medicine

## 2021-05-15 NOTE — Progress Notes (Deleted)
New Hematology/Oncology Consult   Requesting MD: Dr. Andrena Mews  [phone]  Reason for Consult: Abnormal SPEP, unexplained weight loss  HPI: Jennifer Foster is a 75 year old woman with multiple medical problems including atrial fibrillation, CAD, CHF, chronic kidney disease stage III, hypertension referred for evaluation of an abnormal SPEP and unexplained weight loss.  SPEP on 04/19/2021 returned with serum M spike 0.3%; beta-2 microglobulin elevated at 11.4.  Most recent basic metabolic panel 5/46/5681 shows serum creatinine 4.81, CBC shows a hemoglobin 10.1, MCV 89.   Past Medical History:  Diagnosis Date   Acute on chronic blood loss anemia 06/26/2015   Acute respiratory failure with hypoxia ([place]) 02/21/2018   Aortic atherosclerosis ([place]) 01/01/2018   CT 04/2017   Asthma    Atrial fibrillation ([place]) 10/13/2017   Newly diagnosed in Jan 2019 // Apixaban for anticoag // Apixaban held in 2019 for anemia but resumed; Hgb stable since restarting   CAD (coronary artery disease) 11/27/2017   Nuc stress 3/19: anterior ischemia, ?inf scar, EF 50, intermediate risk // LHC 4/19: LAD mild dz, pLCx 35, OM2 30, mRCA 60, dRCA 65, LVEDP 15-20   Chronic diastolic CHF (congestive heart failure) ([place]) 03/23/2018   Echo 2/19: Moderate LVH, EF 27-51, grade 2 diastolic dysfunction, trivial MR, moderate to severe LAE, PASP 30//Echocardiogram 7/21: EF 55-60, severe LVH, severe LAE, mod RAE, mild MR, mild dilation of Asc aorta (40 mm), RVSP 27.6    Chronic kidney disease (CKD), stage III (moderate) ([place]) 06/27/2015   DIVERTICULAR BLEEDING, HX OF 10/14/2007   Colonoscopy 2008 showed diverticulosis.    GERD (gastroesophageal reflux disease)    Gout    Hyperlipemia    Hypertension    Hypertensive urgency 10/25/2020   Proteinuria 01/11/2016   Thoracic aortic aneurysm 04/03/2017   CT 8/18: ascending thoracic aorta 4.1 cm // unable to do CTA due to CKD // Chest MRA 05/2019: Ascending thoracic aorta 40 mm  :   Past  Surgical History:  Procedure Laterality Date   ABDOMINAL HYSTERECTOMY  1981   partial, per pt history   BIOPSY  09/05/2019   Procedure: BIOPSY;  Surgeon: Ronnette Juniper, MD;  Location: [facility];  Service: Gastroenterology;;   COLONOSCOPY N/A 12/08/2013   Procedure: COLONOSCOPY;  Surgeon: [name], MD;  Location: [place];  Service: Endoscopy;  Laterality: N/A;   COLONOSCOPY N/A 06/28/2015   Procedure: COLONOSCOPY;  Surgeon: Clarene Essex, MD;  Location: [facility] ENDOSCOPY;  Service: Endoscopy;  Laterality: N/A;   ESOPHAGOGASTRODUODENOSCOPY N/A 12/08/2013   Procedure: ESOPHAGOGASTRODUODENOSCOPY (EGD);  Surgeon: [name], MD;  Location: [facility] ENDOSCOPY;  Service: Endoscopy;  Laterality: N/A;   ESOPHAGOGASTRODUODENOSCOPY (EGD) WITH PROPOFOL N/A 09/05/2019   Procedure: ESOPHAGOGASTRODUODENOSCOPY (EGD) WITH PROPOFOL;  Surgeon: Ronnette Juniper, MD;  Location: [facility] ENDOSCOPY;  Service: Gastroenterology;  Laterality: N/A;   GIVENS CAPSULE STUDY N/A 12/08/2013   Procedure: GIVENS CAPSULE STUDY;  Surgeon: [name], MD;  Location: [place];  Service: Endoscopy;  Laterality: N/A;   LEFT HEART CATH AND CORONARY ANGIOGRAPHY N/A 12/03/2017   Procedure: LEFT HEART CATH AND CORONARY ANGIOGRAPHY;  Surgeon: Nelva Bush, MD;  Location: [place] CV LAB;  Service: Cardiovascular;  Laterality: N/A;   LIPOMA EXCISION  01/28/11   neck     Current Outpatient Medications:    famotidine (PEPCID) 20 MG tablet, TAKE 1 TABLET(20 MG) BY MOUTH DAILY AS NEEDED FOR HEARTBURN OR INDIGESTION, Disp: 90 tablet, Rfl: 1   albuterol (VENTOLIN HFA) 108 (90 Base) MCG/ACT inhaler, Inhale 2 puffs  into the lungs every 6 (six) hours as needed for wheezing or shortness of breath. (Patient not taking: No sig reported), Disp: 18 g, Rfl: 3   amLODipine (NORVASC) 10 MG tablet, TAKE 1 TABLET(10 MG) BY MOUTH DAILY, Disp: 90 tablet, Rfl: 1   apixaban (ELIQUIS) 5 MG TABS tablet, Take 1 tablet (5 mg total) by mouth 2 (two) times daily., Disp:  60 tablet, Rfl: 6   calcitRIOL (ROCALTROL) 0.25 MCG capsule, Take 0.25 mcg by mouth daily. , Disp: , Rfl:    diclofenac Sodium (VOLTAREN) 1 % GEL, Apply 4 g topically 4 (four) times daily. Apply to hip joint (Patient not taking: No sig reported), Disp: 50 g, Rfl: 1   furosemide (LASIX) 20 MG tablet, TAKE 1 TABLET(20 MG) BY MOUTH DAILY, Disp: 90 tablet, Rfl: 1   hydrALAZINE (APRESOLINE) 25 MG tablet, Take 1 tablet (25 mg total) by mouth 3 (three) times daily., Disp: 270 tablet, Rfl: 3   isosorbide mononitrate (IMDUR) 30 MG 24 hr tablet, TAKE 1 TABLET(30 MG) BY MOUTH DAILY (Patient taking differently: Take 30 mg by mouth daily.), Disp: 90 tablet, Rfl: 2   mirtazapine (REMERON) 30 MG tablet, Take 1 tablet (30 mg total) by mouth at bedtime., Disp: 90 tablet, Rfl: 3   nitroGLYCERIN (NITROSTAT) 0.4 MG SL tablet, Place 1 tablet (0.4 mg total) under the tongue every 5 (five) minutes as needed for chest pain. If do not resolve after 1 tablet, go to ED (Patient not taking: No sig reported), Disp: 10 tablet, Rfl: 0   potassium chloride SA (KLOR-CON) 20 MEQ tablet, Take 0.5 tablets (10 mEq total) by mouth daily. Patient needs appointment for any future refills. Please call office at [phone] to schedule appointment. 1st attempt., Disp: 45 tablet, Rfl: 0   rosuvastatin (CRESTOR) 20 MG tablet, TAKE 1 TABLET(20 MG) BY MOUTH AT BEDTIME, Disp: 90 tablet, Rfl: 1:    Allergies  Allergen Reactions   Ace Inhibitors Swelling and Other (See Comments)    Eyes swell   Lisinopril Swelling and Other (See Comments)    Swollen tongue    Tramadol Itching    FH:  SOCIAL HISTORY:  Review of Systems:   Physical Exam:  There were no vitals taken for this visit.  HEENT: *** Lungs: *** Cardiac: *** Abdomen: *** GU: ***  Vascular: *** Lymph nodes: *** Neurologic: *** Skin: *** Musculoskeletal: ***  LABS:  No results for input(s): WBC, HGB, HCT, PLT in the last 72 hours.  No results for input(s): NA,  K, CL, CO2, GLUCOSE, BUN, CREATININE, CALCIUM in the last 72 hours.  RADIOLOGY:  No results found.  Assessment and Plan:   ***    Ned Card, NP 05/15/2021, 3:48 PM

## 2021-05-16 ENCOUNTER — Inpatient Hospital Stay: Payer: Medicare HMO | Attending: Nurse Practitioner | Admitting: Nurse Practitioner

## 2021-05-23 ENCOUNTER — Ambulatory Visit: Payer: Medicare HMO

## 2021-05-23 NOTE — Patient Instructions (Signed)
Visit Information  Ms. Bown  it was nice speaking with you. Please call me directly [phone] if you have questions about the goals we discussed.   Goals Addressed               This Visit's Progress     Heart Failure (pt-stated)        Timeframe:  Long-Range Goal Priority:  High Start Date:     01/11/20                        Expected End Date: 06/30/21     Follow up date: 06/20/21   Patient Goals/Self care Activities: Takes Heart Failure Medications as prescribed Verbalizes understanding of and follows CHF Action Plan Adheres to low sodium diet Patient will weigh daily and record values Patient will Call 911 with any symptoms in the HF Red zone  Notes: 03/12/2021: Patient reports no shortness of breath, Chest pain, or swelling.  Patient is inconsistent with weighing herself at home.      Hypertention Goals         Timeframe:  Long-Range Goal Priority:  High Start Date:  07/11/20                           Expected End Date: 06/30/21  Follow up date: 06/20/21   Patient Goal/Self Care Activities:  Self administers medications as prescribed Attends all scheduled provider appointments Calls provider office for new concerns, questions, or BP outside discussed parameters Checks BP and records as discussed Follows a low sodium diet/DASH diet  agree to work together to make changes ask questions to understand  Notes: 03/12/2021: Patient not checking blood pressure lately because she "forgets".  Encouraged her to check blood pressure at least 2 to 3 times a week and record blood pressure readings.  Patient verbalized understanding.       The patient verbalizes understanding of the information and instructions discussed today.  Our next appointment is scheduled for  06/20/21.   Please feel free to call me or the office if you have any questions or concerns.  Lazaro Arms RN, BSN, [facility] Care Management Coordinator [place] Phone: [phone] I  Fax: [phone]

## 2021-05-23 NOTE — Chronic Care Management (AMB) (Signed)
Chronic Care Management   CCM RN Visit Note  05/23/2021 Name: Jennifer Foster MRN: [id number] DOB: 02/08/1946  Subjective: Jennifer Foster is a 75 y.o. year old female who is a primary care patient of [name], MD. The care management team was consulted for assistance with disease management and care coordination needs.    Engaged with patient by telephone for follow up visit in response to provider referral for case management and/or care coordination services.   Consent to Services:  The patient was given information about Chronic Care Management services, agreed to services, and gave verbal consent prior to initiation of services.  Please see initial visit note for detailed documentation.   Patient agreed to services and verbal consent obtained.    Assessment:  The patient continues to experience difficulty with checking her vital signs and remembering her appointments... See Care Plan below for interventions and patient self-care actives. Follow up Plan: Patient would like continued follow-up.  CCM RNCM will outreach the patient within the next 4 weeks  Patient will call office if needed prior to next encounter Review of patient past medical history, allergies, medications, health status, including review of consultants reports, laboratory and other test data, was performed as part of comprehensive evaluation and provision of chronic care management services.   SDOH (Social Determinants of Health) assessments and interventions performed:    CCM Care Plan  Allergies  Allergen Reactions   Ace Inhibitors Swelling and Other (See Comments)    Eyes swell   Lisinopril Swelling and Other (See Comments)    Swollen tongue    Tramadol Itching    Outpatient Encounter Medications as of 05/23/2021  Medication Sig Note   albuterol (VENTOLIN HFA) 108 (90 Base) MCG/ACT inhaler Inhale 2 puffs into the lungs every 6 (six) hours as needed for wheezing or shortness of breath. (Patient not  taking: No sig reported)    amLODipine (NORVASC) 10 MG tablet TAKE 1 TABLET(10 MG) BY MOUTH DAILY    apixaban (ELIQUIS) 5 MG TABS tablet Take 1 tablet (5 mg total) by mouth 2 (two) times daily.    calcitRIOL (ROCALTROL) 0.25 MCG capsule Take 0.25 mcg by mouth daily.     diclofenac Sodium (VOLTAREN) 1 % GEL Apply 4 g topically 4 (four) times daily. Apply to hip joint (Patient not taking: No sig reported) 10/26/2020: Ran out   famotidine (PEPCID) 20 MG tablet TAKE 1 TABLET(20 MG) BY MOUTH DAILY AS NEEDED FOR HEARTBURN OR INDIGESTION    furosemide (LASIX) 20 MG tablet TAKE 1 TABLET(20 MG) BY MOUTH DAILY    hydrALAZINE (APRESOLINE) 25 MG tablet Take 1 tablet (25 mg total) by mouth 3 (three) times daily.    isosorbide mononitrate (IMDUR) 30 MG 24 hr tablet TAKE 1 TABLET(30 MG) BY MOUTH DAILY (Patient taking differently: Take 30 mg by mouth daily.)    mirtazapine (REMERON) 30 MG tablet Take 1 tablet (30 mg total) by mouth at bedtime.    nitroGLYCERIN (NITROSTAT) 0.4 MG SL tablet Place 1 tablet (0.4 mg total) under the tongue every 5 (five) minutes as needed for chest pain. If do not resolve after 1 tablet, go to ED (Patient not taking: No sig reported)    potassium chloride SA (KLOR-CON) 20 MEQ tablet Take 0.5 tablets (10 mEq total) by mouth daily. Patient needs appointment for any future refills. Please call office at [phone] to schedule appointment. 1st attempt.    rosuvastatin (CRESTOR) 20 MG tablet TAKE 1 TABLET(20 MG) BY MOUTH AT  BEDTIME    No facility-administered encounter medications on file as of 05/23/2021.    Patient Active Problem List   Diagnosis Date Noted   Abnormal echocardiogram 11/02/2020   Splenic infarct    Sciatica of right side without back pain 07/12/2020   Acquired thrombophilia ([place]) 01/24/2020   History of cocaine abuse ([place]) 10/03/2019   Upper GI bleed 09/04/2019   Chronic diastolic CHF (congestive heart failure) ([place]) 03/23/2018   Aortic atherosclerosis ([place])  01/01/2018   CAD (coronary artery disease) 11/27/2017   Atrial fibrillation ([place]) 10/13/2017   Multinodular goiter 04/14/2017   Adrenal adenoma 04/14/2017   Thoracic aortic aneurysm without rupture ([place]) 04/03/2017   Weight loss 01/30/2017   Anxiety and depression 12/25/2015   CKD (chronic kidney disease) stage 4, GFR 15-29 ml/min ([place]) 06/27/2015   Essential hypertension    Gout 02/28/2014   Lipoma 03/19/2011   Hyperlipidemia 01/01/2011   Chronic anemia 11/23/2009   Tobacco abuse 09/03/2009    Conditions to be addressed/monitored:CHF and HTN  Care Plan : RN Case Manager  Updates made by Lazaro Arms, RN since 05/23/2021 12:00 AM     Problem: (Heart Failure) and (Hypertension)      Long-Range Goal: Patient will weigh daily and check blood pressure monitoring syptoms   Start Date: 01/11/2020  Expected End Date: 06/30/2021  Recent Progress: On track  Priority: High  Note:    Current Barriers:  Knowledge deficit related to basic heart failure pathophysiology and self care management Knowledge Deficits related to basic understanding of hypertension pathophysiology and self care management  Case Manager Clinical Goal(s):  Over the next 90 days, patient will weigh self daily and record Over the next 30 days, patient will verbalize understanding of plan for hypertension management Over the next 90 days, patient will demonstrate improved adherence to prescribed treatment plan for hypertension as evidenced by taking all medications as prescribed, monitoring and recording blood pressure as directed, adhering to low sodium/DASH diet  Interventions:  CHF Basic overview and discussion of  Heart Failure reviewed Provided verbal education on low sodium diet Reviewed Heart Failure Action Plan in depth and provided written copy Provided education about placing scale on hard, flat surface Advised patient to weigh each morning after emptying bladder Discussed importance of daily weight  and advised patient to weigh and record daily medication-adherence assessment completed-she reports that she is taking her medications as prescribed.  self-awareness of signs/symptoms of worsening disease encouraged  Interventions:  HTN Evaluation of current treatment plan related to hypertension self management and patient's adherence to plan as established by provider.  Provided education to patient re: stroke prevention, s/s of heart attack and stroke, DASH diet, complications of uncontrolled blood pressure Reviewed medications with patient and discussed importance of compliance Discussed plans with patient for ongoing care management follow up and provided patient with direct contact information for care management team Advised patient, providing education and rationale, to monitor blood pressure daily and record, calling PCP for findings outside established parameters.  medication list reviewed understanding of current medications assessed Patient reports that she has not checked her BP and could not give me any values.  05/23/21: I spoke with Jennifer Foster this morning, and she states that she is doing well, except that her legs bother her occasionally.   We discussed her blood pressure and weight. She said she has a blood pressure monitor but had not checked today yet and could not remember the values from yesterday. She could not remember her weight  either but felt like she was losing weight. She denies any chest pain, shortness of breath, or swelling. She is taking her medications regularly. I talked with her about missed appointments with Oncology, and she denied talking with anyone about making an appointment or getting a letter; she could not remember. I asked if she would like me to call and get her another meeting set up with Oncology, and she said that was ok. She did not want me to reschedule the visit. She would like for our calls to continue. I advised her to continue to take her  medications, check her values and keep appointments when made. If any problems arise, call the office for a check-up, and for any severe issues, contact 911. The patient verbalized understanding.   Patient Goals/Self Care Activities:  Takes Heart Failure Medications as prescribed Verbalizes understanding of and follows CHF Action Plan Adheres to low sodium diet Self administers medications as prescribed Attends all scheduled provider appointments Calls provider office for new concerns, questions, or BP outside discussed parameters Checks BP and records as discussed Follows a low sodium diet/DASH diet Check  BP 3 times / week and record values       Lazaro Arms RN, BSN, [facility] Care Management Coordinator [place] Phone: [phone] I Fax: [phone]

## 2021-05-24 ENCOUNTER — Encounter (HOSPITAL_COMMUNITY): Payer: Self-pay

## 2021-05-25 ENCOUNTER — Other Ambulatory Visit: Payer: Self-pay | Admitting: Physician Assistant

## 2021-06-11 ENCOUNTER — Encounter: Payer: Self-pay | Admitting: Family Medicine

## 2021-06-11 ENCOUNTER — Ambulatory Visit (INDEPENDENT_AMBULATORY_CARE_PROVIDER_SITE_OTHER): Payer: Medicare HMO | Admitting: Family Medicine

## 2021-06-11 ENCOUNTER — Other Ambulatory Visit: Payer: Self-pay

## 2021-06-11 VITALS — BP 163/89 | HR 55 | Wt 107.8 lb

## 2021-06-11 DIAGNOSIS — I7121 Aneurysm of the ascending aorta, without rupture: Secondary | ICD-10-CM | POA: Diagnosis not present

## 2021-06-11 DIAGNOSIS — W19XXXS Unspecified fall, sequela: Secondary | ICD-10-CM | POA: Diagnosis not present

## 2021-06-11 DIAGNOSIS — Z1231 Encounter for screening mammogram for malignant neoplasm of breast: Secondary | ICD-10-CM | POA: Diagnosis not present

## 2021-06-11 DIAGNOSIS — L98429 Non-pressure chronic ulcer of back with unspecified severity: Secondary | ICD-10-CM

## 2021-06-11 DIAGNOSIS — I10 Essential (primary) hypertension: Secondary | ICD-10-CM

## 2021-06-11 DIAGNOSIS — R413 Other amnesia: Secondary | ICD-10-CM | POA: Diagnosis not present

## 2021-06-11 DIAGNOSIS — R001 Bradycardia, unspecified: Secondary | ICD-10-CM

## 2021-06-11 DIAGNOSIS — R634 Abnormal weight loss: Secondary | ICD-10-CM

## 2021-06-11 DIAGNOSIS — R778 Other specified abnormalities of plasma proteins: Secondary | ICD-10-CM

## 2021-06-11 DIAGNOSIS — W19XXXA Unspecified fall, initial encounter: Secondary | ICD-10-CM | POA: Insufficient documentation

## 2021-06-11 DIAGNOSIS — Z23 Encounter for immunization: Secondary | ICD-10-CM

## 2021-06-11 DIAGNOSIS — R931 Abnormal findings on diagnostic imaging of heart and coronary circulation: Secondary | ICD-10-CM

## 2021-06-11 MED ORDER — HYDRALAZINE HCL 10 MG PO TABS: 10.0000 mg | ORAL_TABLET | Freq: Three times a day (TID) | ORAL | 1 refills | Status: DC

## 2021-06-11 NOTE — Patient Instructions (Signed)

## 2021-06-11 NOTE — Assessment & Plan Note (Signed)
She is up to date with cancer screen except mammogram. I again discussed this with her and her husband to schedule. Mammogram ordered as well.  I discussed her protein electrophoresis report with her and her husband. ?? Blood malignancy. She missed her oncology appointment x 2. I will place a new referral. She and her husband agreed with the plan to f/u with oncology for further evaluation.

## 2021-06-11 NOTE — Assessment & Plan Note (Signed)
Mechanical fall. Gait steady during exam with no neurologic deficit. Implement fall precautions and monitor closely. Consider PT referral in the future if this reoccurs.

## 2021-06-11 NOTE — Assessment & Plan Note (Signed)
Likely decubitus. Healing well. Wound dressing competed today. F/U in 1-2 weeks for wound check. She agreed with the plan.

## 2021-06-11 NOTE — Assessment & Plan Note (Signed)
Hx of Afib, not on rate controlling medications. Was in NSR during this exam. She is currently asymptomatic. Given her multiple medical conditions and abnormal ECHO for which she was supposed to f/u with Cards, I discussed Cards referral with her and her husband and they agreed with the plan. Cards referral placed.

## 2021-06-11 NOTE — Progress Notes (Signed)
SUBJECTIVE:   CHIEF COMPLAINT / HPI:   Sacral ulcer: She presented with an ulcer on her lower back (sacral region), initially with swelling that started about a month ago. The swelling has since gone down, and the ulcer seems to be healing slowly. She denies any trauma to her lower back. Unclear how this developed.  Memory loss: Her husband, who came in with her today, c/o progressive memory loss over the past two months. It might have started before then, but now more noticeable. She mixes up her days and forgets things easily. No headache, no head trauma, no vision changes. Feels well in general.  Weight loss: Her husband stated that sometimes she forgets to eat. No other concerns.  Fall: She fell on her face two weeks ago, sustaining bruises around her eyes. she denies any pain today.  HTN/Bradycardia: She is compliant with Norvasc 10 mg QD, Lasix 20 mg QD, and Imdur 30 mg QD. She did not come in with her Hydralazine 25 mg TID bottle, and she could not tell me whether she was taking this medication or not. Low HR, denies SOB, no dizziness, no palpitations. She feels well otherwise.  Aortic aneurysm: No chest pain, no SOB. Here for f/u.  Abnormal ECHO report: She never followed up with Cardiology as instructed.  PERTINENT  PMH / PSH: PMX reviewed  OBJECTIVE:   Vitals:   06/11/21 0845 06/11/21 0854 06/11/21 0857  BP: (!) 154/60  (!) 163/89  Pulse: (!) 45 (!) 51 (!) 55  SpO2: 100%    Weight: 107 lb 12.8 oz (48.9 kg)    Body mass index is 18.5 kg/m.   Physical Exam Vitals and nursing note reviewed.  Cardiovascular:     Rate and Rhythm: Normal rate.     Heart sounds: Normal heart sounds. No murmur heard.   No gallop.  Pulmonary:     Effort: Pulmonary effort is normal. No respiratory distress.     Breath sounds: Normal breath sounds. No wheezing or rhonchi.  Abdominal:     General: Abdomen is flat. Bowel sounds are normal. There is no distension.     Palpations:  Abdomen is soft. There is no mass.     Tenderness: There is no abdominal tenderness.  Musculoskeletal:     Right lower leg: No edema.     Left lower leg: No edema.  Skin:    Comments: Dry ulcer scab on her mid gluteal cleft, just around her tail bone. No swelling, no tenderness, no erythema.  Scanty bruises on her nasal ridge, around her eye B/L  Neurological:     General: No focal deficit present.     Mental Status: She is alert and oriented to person, place, and time.     Sensory: Sensation is intact.     Motor: Motor function is intact.     Coordination: Coordination is intact.     ASSESSMENT/PLAN:   Sacral ulcer ([place]) Likely decubitus. Healing well. Wound dressing competed today. F/U in 1-2 weeks for wound check. She agreed with the plan.   Memory loss Progressive. ?? Alzheimer's dx. Recent lab work (RPR, HIV, TSH, B12) were normal. I discussed MRI brain with her and her husband for further evaluation and they agreed with the plan. MRI ordered. CMA will contact them to schedule it.   Weight loss She is up to date with cancer screen except mammogram. I again discussed this with her and her husband to schedule. Mammogram ordered as well.  I  discussed her protein electrophoresis report with her and her husband. ?? Blood malignancy. She missed her oncology appointment x 2. I will place a new referral. She and her husband agreed with the plan to f/u with oncology for further evaluation.   Fall Mechanical fall. Gait steady during exam with no neurologic deficit. Implement fall precautions and monitor closely. Consider PT referral in the future if this reoccurs.   Essential hypertension She is uncertain her BP regimen. I called her after visit for medication reconciliation and she did not have her Hydralazine. I called her pharmacy and was informed that she last picked up Hydralazine 25 mg TID 6 months ago and has refill waiting at the pharmacy for her to pick  up. As discussed with her and her husband, we will resume her Hydralazine at a lower dose of 10 mg TID. Med refilled and I called her pharmacy back (spoke with Tom) to take Hydralazine 25 mg off her medication list. F/U in 1-2 weeks for BP check.  Bradycardia Hx of Afib, not on rate controlling medications. Was in NSR during this exam. She is currently asymptomatic. Given her multiple medical conditions and abnormal ECHO for which she was supposed to f/u with Cards, I discussed Cards referral with her and her husband and they agreed with the plan. Cards referral placed.  Abnormal echocardiogram Recent ECHO showed left ventricular regional wall abnormalities and small pericardial effusion without tamponade physiology consistent with hypertrophic cardiomyopathy and to consider amyloid. She missed her Cardiology appointment at the time. Patient referred again to cardiology. She agreed with the plan.   Thoracic aortic aneurysm without rupture ([place]) MRA ordered to monitor progression. CMA will contact her to schedule her appointment.    Flu shot given during this visit.   NB: Patient presented late to her visit today and the other portion of her visit was conducted virtually at the end of the clinic. More than 50% conducted in person. Total time spent > 50 minutes.  Andrena Mews, MD [place]

## 2021-06-11 NOTE — Assessment & Plan Note (Signed)
Progressive. ?? Alzheimer's dx. Recent lab work (RPR, HIV, TSH, B12) were normal. I discussed MRI brain with her and her husband for further evaluation and they agreed with the plan. MRI ordered. CMA will contact them to schedule it.

## 2021-06-11 NOTE — Assessment & Plan Note (Signed)
Recent ECHO showed left ventricular regional wall abnormalities and small pericardial effusion without tamponade physiology consistent with hypertrophic cardiomyopathy and to consider amyloid. She missed her Cardiology appointment at the time. Patient referred again to cardiology. She agreed with the plan.

## 2021-06-11 NOTE — Assessment & Plan Note (Signed)
She is uncertain her BP regimen. I called her after visit for medication reconciliation and she did not have her Hydralazine. I called her pharmacy and was informed that she last picked up Hydralazine 25 mg TID 6 months ago and has refill waiting at the pharmacy for her to pick up. As discussed with her and her husband, we will resume her Hydralazine at a lower dose of 10 mg TID. Med refilled and I called her pharmacy back (spoke with Tom) to take Hydralazine 25 mg off her medication list. F/U in 1-2 weeks for BP check.

## 2021-06-11 NOTE — Assessment & Plan Note (Signed)
MRA ordered to monitor progression. CMA will contact her to schedule her appointment.

## 2021-06-13 ENCOUNTER — Other Ambulatory Visit: Payer: Self-pay

## 2021-06-13 NOTE — Progress Notes (Deleted)
Cardiology Office Note:    Date:  06/13/2021   ID:  [name], DOB April 01, 1946, MRN [id number]  PCP:  [name], MD   [facility] Providers Cardiologist:  Mertie Moores, MD { Click to update primary MD,subspecialty MD or APP then REFRESH:1}  *** Referring MD: [name], MD   Chief Complaint:  No chief complaint on file. {Click here for Visit Info    :1}   Patient Profile:   Jennifer Foster is a 75 y.o. female with:  Coronary artery disease  Myoview 3/19: ant ischemia  Cath 3/19: mRCA 60, dRCA 60-70, mild-mod dz of LAD, LCx - Med Rx Chronic Diastolic CHF Echocardiogram 2/19: EF 60-65, Gr 2 DD, LAE Echocardiogram 4/21 (in setting of CAP): EF 50, RVSP 63.7 Echocardiogram 7/21: EF 55-60, RVSP 27.6  Echo 2/22: EF 50-55, severe LVH, GR 1 DD, inferolateral and anterolateral WMA Paroxysmal atrial fibrillation CHA2DS2-VASc=5 (HTN, CAD, CHF, female, age x 1) >> Apixaban    Hypertension  Thoracic aortic aneurysm  CT 04/2017: 4.1 cm  MRA 05/2019: 4.0 cm  Anemia of chronic disease Hx of Remote GI bleed UGI bleed 09/2019 Chronic kidney disease stage 3 Hx of substance abuse (cocaine)  Prior CV studies: Echocardiogram 10/26/20 EF 50-55, severe LVH (hypertrophic cardiomyopathy; 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 Left ventricular ejection fraction, by estimation, is 50 to 55%. The  left ventricle has low normal function. The left ventricle demonstrates  regional wall motion abnormalities (see scoring diagram/findings for  description). There is severe left  ventricular hypertrophy. Left ventricular diastolic parameters are  consistent with Grade I diastolic dysfunction (impaired relaxation).   2. Right ventricular systolic function is normal.  The right ventricular  size is normal. Mildly increased right ventricular wall thickness. There  is normal pulmonary artery systolic pressure. The estimated right  ventricular systolic pressure is 09.3  mmHg.   3. Left atrial size was severely dilated.   4. Right atrial size was moderately dilated.   5. A small pericardial effusion is present. The pericardial effusion is  posterior to the left ventricle. There is no evidence of cardiac  tamponade. Moderate pleural effusion.   6. The mitral valve is normal in structure. Trivial mitral valve  regurgitation. No evidence of mitral stenosis.   7. The aortic valve is tricuspid. There is mild calcification of the  aortic valve. There is mild thickening of the aortic valve. Aortic valve  regurgitation is not visualized. Mild to moderate aortic valve  sclerosis/calcification is present, without any  evidence of aortic stenosis.   8. Aortic dilatation noted. There is borderline dilatation at the level  of the sinuses of Valsalva, measuring 38 mm.   9. The inferior vena cava is normal in size with greater than 50%  respiratory variability, suggesting right atrial pressure of 3 mmHg.   Echo 03/23/2020 EF 55-60, normal wall motion, normal RVSF, BAE, mild MR, aortic root 40 mm, RVSP 27.6   Echocardiogram 12/26/2019 EF 50, mod LVH, Lat HK, mild reduced RVSF, RVSP 63.7 (severely elevated), severe BAE, mild-mod MR, mild-mod TR, trivial AI   Chest MRA 05/20/2019 Asc thoracic aorta 4.0 cm   Cardiac catheterization 12/03/2017 LAD mild disease LCx proximal 35; OM2 30 RCA mid 60, distal 65 LVEDP 15-20   Nuclear stress test  11/12/2017 Medium size, moderate severity partially reversible anterior perfusion defect suggestive of ischemia. There is also a smaller, basal to mid inferior perfusion defect which is fixed, suggestive of artifact or scar.  LVEF 50% with inferior hypokinesis. This is an intermediate risk study. Clinical correlation is recommended.    Echo 10/22/2017 Moderate LVH, EF 44-01, grade 2 diastolic dysfunction, trivial MR, moderate to severe LAE, PASP 30   Chest CT w/ contrast 04/03/17 IMPRESSION: 1. No suspicious pulmonary nodule or mass. 2. 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 Recommend annual imaging followup by CTA or MRA. This recommendation follows 2010 ACCF/AHA/AATS/ACR/ASA/SCA/SCAI/SIR/STS/SVM Guidelines for the Diagnosis and Management of Patients with Thoracic Aortic Disease. Circulation. 2010; 121: U272-Z366 3. Coronary artery calcifications 4. Mild centrilobular pulmonary emphysema. 5. Multinodular thyroid goiter. 6. Left adrenal thickening likely representing hyperplasia and 1.9 cm right adrenal adenoma. Aortic Atherosclerosis (ICD10-170.0); Aortic Atherosclerosis (ICD10-I70.0) and Emphysema (ICD10-J43.9).   Holter 7/18 NSR with PAC's Brief short SVT Overall, unremarkable   Nuclear stress test 11/23/2014 Normal stress nuclear study. overall low risk with no evidence of significant ischemia identified. Left ventricular hypertrophy suggested by increased wall thickness seen on perfusion images.  LV Ejection Fraction: 56%.   Echo 03/16/2013 Severe LVH, EF 55-60, mild MR, mild to moderate LAE, PASP 33  {Select studies to display:26339}   History of Present Illness: Jennifer Foster was last seen via telemedicine in 05/2020.  She was admitted in 10/2020 with abdominal pain secondary to splenic infarct.  She was managed with IV heparin and then transitioned back to Eliquis with 10 mg twice daily for a week followed by 5 mg twice daily.  Echocardiogram during that admission demonstrated severe LVH, EF 50-55.  There was basal inferolateral, mid anterolateral akinesis and mid inferolateral hypokinesis noted.  It was also noted that the findings were consistent with hypertrophic cardiomyopathy and amyloid should be considered.  She was supposed to follow-up with  cardiology but never did.  She saw primary care earlier this week for memory loss.  Of note, brain MRI is pending.  Chest MRI is also pending to follow-up on thoracic aortic aneurysm.  She was asked to follow-up with cardiology due to her abnormal echocardiogram.***      Past Medical History:  Diagnosis Date   Acute on chronic blood loss anemia 06/26/2015   Acute respiratory failure with hypoxia ([place]) 02/21/2018   Aortic atherosclerosis ([place]) 01/01/2018   CT 04/2017   Asthma    Atrial fibrillation ([place]) 10/13/2017   Newly diagnosed in Jan 2019 // Apixaban for anticoag // Apixaban held in 2019 for anemia but resumed; Hgb stable since restarting   CAD (coronary artery disease) 11/27/2017   Nuc stress 3/19: anterior ischemia, ?inf scar, EF 50, intermediate risk // LHC 4/19: LAD mild dz, pLCx 35, OM2 30, mRCA 60, dRCA 65, LVEDP 15-20   Chronic diastolic CHF (congestive heart failure) ([place]) 03/23/2018   Echo 2/19: Moderate LVH, EF 44-03, grade 2 diastolic dysfunction, trivial MR, moderate to severe LAE, PASP 30//Echocardiogram 7/21: EF 55-60, severe LVH, severe LAE, mod RAE, mild MR, mild dilation of Asc aorta (40 mm), RVSP 27.6    Chronic kidney disease (CKD), stage III (moderate) ([place]) 06/27/2015   DIVERTICULAR BLEEDING, HX OF 10/14/2007   Colonoscopy 2008 showed diverticulosis.    GERD (gastroesophageal reflux disease)    Gout    Hyperlipemia    Hypertension    Hypertensive urgency 10/25/2020   Proteinuria 01/11/2016   Thoracic  aortic aneurysm 04/03/2017   CT 8/18: ascending thoracic aorta 4.1 cm // unable to do CTA due to CKD // Chest MRA 05/2019: Ascending thoracic aorta 40 mm   Current Medications: No outpatient medications have been marked as taking for the 06/14/21 encounter (Appointment) with Richardson Dopp T, PA-C.    Allergies:   Ace inhibitors, Lisinopril, and Tramadol   Social History   Tobacco Use   Smoking status: Some Days    Packs/day: 0.25    Types: Cigarettes   Smokeless  tobacco: Never  Vaping Use   Vaping Use: Never used  Substance Use Topics   Alcohol use: Yes    Alcohol/week: 2.0 standard drinks    Types: 2 Standard drinks or equivalent per week    Comment: 1-2 beers    Drug use: No    Family Hx: The patient's family history includes Diabetes type II in an other family member; Kidney failure in her son and another family member.  ROS   EKGs/Labs/Other Test Reviewed:    EKG:  EKG is *** ordered today.  The ekg ordered today demonstrates ***  Recent Labs: 10/26/2020: Magnesium 2.1 02/18/2021: ALT 9; TSH 0.439 03/19/2021: BUN 35; Creatinine, Ser 4.81; Hemoglobin 10.1; Platelets 200; Potassium 4.0; Sodium 135   Recent Lipid Panel Lab Results  Component Value Date/Time   CHOL 151 09/28/2020 09:17 AM   TRIG 52 09/28/2020 09:17 AM   HDL 94 09/28/2020 09:17 AM   LDLCALC 46 09/28/2020 09:17 AM   LDLDIRECT 64 05/31/2019 09:57 AM   LDLDIRECT 88 07/16/2011 11:38 AM     Risk Assessment/Calculations:   {Does this patient have ATRIAL FIBRILLATION?:[id number]}      Physical Exam:    VS:  There were no vitals taken for this visit.    Wt Readings from Last 3 Encounters:  06/11/21 107 lb 12.8 oz (48.9 kg)  04/19/21 109 lb 8 oz (49.7 kg)  03/19/21 114 lb (51.7 kg)    Physical Exam ***     ASSESSMENT & PLAN:   {Select for Dx:25819} 1. Chronic diastolic CHF (congestive heart failure) (HCC) Overall, volume status seems to be stable.  Continue current dose of furosemide.   2. Other secondary pulmonary hypertension ([place]) This was related to her pneumonia.  Her RVSP returned to normal on follow-up echocardiogram in July 2021.   3. Coronary artery disease involving native coronary artery of native heart without angina pectoris Moderate nonobstructive disease by cardiac catheterization March 2019.  She is not having anginal symptoms.  She is not on aspirin as she is on Apixaban.  New rosuvastatin.   4. Stage 3b chronic kidney disease Most recent  creatinine stable.  She is followed by Dr. Posey Pronto with nephrology.   5. Paroxysmal atrial fibrillation (HCC) Her heart rate seems to be well controlled.  When last seen, her ECG demonstrated a heart rate in the 50s.  Therefore, I am not certain she would tolerate resumption of her beta-blocker therapy.  She is on Apixaban 2.5 mg twice daily given her weight of 57 kg and creatinine >1.5.  Continue current medications.   6. Essential hypertension Blood pressure is above target.  I reviewed her most recent blood pressure readings.  I think she would benefit from resuming hydralazine 25 mg 3 times a day.  Continue current dose of isosorbide and amlodipine.             -Hydralazine 25 mg 3 times a day             -  Monitor blood pressure over 2 weeks   7. Thoracic aortic aneurysm without rupture ([place]) 4 cm by MRA in September 2020.  She is due for follow-up.  It appears that primary care has already ordered this and arranged it.    {Are you ordering a CV Procedure (e.g. stress test, cath, DCCV, TEE, etc)?   Press F2        :[id number]}   Dispo:  No follow-ups on file.   Medication Adjustments/Labs and Tests Ordered: Current medicines are reviewed at length with the patient today.  Concerns regarding medicines are outlined above.  Tests Ordered: No orders of the defined types were placed in this encounter.  Medication Changes: No orders of the defined types were placed in this encounter.  Signed, Richardson Dopp, PA-C  06/13/2021 4:25 PM    [facility] [place], [place], [place]  [address] Phone: [phone]; Fax: [phone]

## 2021-06-14 ENCOUNTER — Ambulatory Visit: Payer: Medicare HMO | Admitting: Physician Assistant

## 2021-06-14 DIAGNOSIS — I480 Paroxysmal atrial fibrillation: Secondary | ICD-10-CM

## 2021-06-14 DIAGNOSIS — I5032 Chronic diastolic (congestive) heart failure: Secondary | ICD-10-CM

## 2021-06-14 DIAGNOSIS — I422 Other hypertrophic cardiomyopathy: Secondary | ICD-10-CM

## 2021-06-14 DIAGNOSIS — I7121 Aneurysm of the ascending aorta, without rupture: Secondary | ICD-10-CM

## 2021-06-14 DIAGNOSIS — I2510 Atherosclerotic heart disease of native coronary artery without angina pectoris: Secondary | ICD-10-CM

## 2021-06-14 DIAGNOSIS — N1832 Chronic kidney disease, stage 3b: Secondary | ICD-10-CM

## 2021-06-14 DIAGNOSIS — I10 Essential (primary) hypertension: Secondary | ICD-10-CM

## 2021-06-17 ENCOUNTER — Telehealth: Payer: Self-pay | Admitting: *Deleted

## 2021-06-17 NOTE — Telephone Encounter (Signed)
Left VM for Jasmine, referral coordinator with phone # and name of [place] new patient referral coordinator.

## 2021-06-17 NOTE — Telephone Encounter (Signed)
Called to inquire if [facility] will reschedule patient to be seen as new patient due to "no shows"? Will other [place] see her if [facility] will not?

## 2021-06-20 ENCOUNTER — Telehealth: Payer: Self-pay

## 2021-06-20 ENCOUNTER — Telehealth: Payer: Medicare HMO

## 2021-06-20 NOTE — Telephone Encounter (Signed)
   RN Case Manager Care Management   Phone Outreach    06/20/2021 Name: Jennifer Foster MRN: [id number] DOB: 08-14-1946  [name] is a 75 y.o. year old female who is a primary care patient of [name], MD .   Telephone outreach was unsuccessful Unable to leave a HIPPA compliant phone message due to voice mail not set up.  Plan:Will route chart to Care Guide to see if patient would like to reschedule phone appointment   Review of patient status, including review of consultants reports, relevant laboratory and other test results, and collaboration with appropriate care team members and the patient's provider was performed as part of comprehensive patient evaluation and provision of care management services.    Lazaro Arms RN, BSN, [facility] Care Management Coordinator [place] Phone: [phone] I Fax: [phone]

## 2021-06-21 ENCOUNTER — Telehealth: Payer: Self-pay | Admitting: *Deleted

## 2021-06-21 NOTE — Chronic Care Management (AMB) (Signed)
  Care Management   Note  06/21/2021 Name: Jennifer Foster MRN: [id number] DOB: 1945-12-14  Jennifer Foster is a 75 y.o. year old female who is a primary care patient of [name], MD and is actively engaged with the care management team. I reached out to [name] by phone today to assist with re-scheduling a follow up visit with the RN Case Manager  Follow up plan: Unsuccessful telephone outreach attempt made. The care management team will reach out to the patient again over the next 7 days. If patient returns call to provider office, please advise to call [place] at [phone].  [place] Management  Direct Dial: [phone]

## 2021-06-22 ENCOUNTER — Other Ambulatory Visit: Payer: Self-pay | Admitting: Physician Assistant

## 2021-06-24 ENCOUNTER — Other Ambulatory Visit: Payer: Self-pay

## 2021-06-24 ENCOUNTER — Ambulatory Visit (HOSPITAL_COMMUNITY)
Admission: RE | Admit: 2021-06-24 | Discharge: 2021-06-24 | Disposition: A | Discharge status: Home / Self Care | Payer: Medicare HMO | Source: Ambulatory Visit | Attending: Family Medicine | Admitting: Family Medicine

## 2021-06-24 ENCOUNTER — Other Ambulatory Visit: Payer: Self-pay | Admitting: *Deleted

## 2021-06-24 DIAGNOSIS — I7121 Aneurysm of the ascending aorta, without rupture: Secondary | ICD-10-CM | POA: Diagnosis not present

## 2021-06-24 DIAGNOSIS — R413 Other amnesia: Secondary | ICD-10-CM

## 2021-06-24 DIAGNOSIS — G319 Degenerative disease of nervous system, unspecified: Secondary | ICD-10-CM | POA: Diagnosis not present

## 2021-06-24 DIAGNOSIS — G9389 Other specified disorders of brain: Secondary | ICD-10-CM | POA: Diagnosis not present

## 2021-06-24 DIAGNOSIS — J90 Pleural effusion, not elsewhere classified: Secondary | ICD-10-CM | POA: Diagnosis not present

## 2021-06-24 MED ORDER — ISOSORBIDE MONONITRATE ER 30 MG PO TB24: 30.0000 mg | ORAL_TABLET | Freq: Every day | ORAL | 0 refills | Status: DC

## 2021-06-25 ENCOUNTER — Ambulatory Visit (INDEPENDENT_AMBULATORY_CARE_PROVIDER_SITE_OTHER): Payer: Medicare HMO | Admitting: Family Medicine

## 2021-06-25 ENCOUNTER — Encounter: Payer: Self-pay | Admitting: Family Medicine

## 2021-06-25 ENCOUNTER — Other Ambulatory Visit: Payer: Self-pay

## 2021-06-25 VITALS — BP 164/74 | HR 68 | Ht 64.0 in | Wt 113.2 lb

## 2021-06-25 DIAGNOSIS — E43 Unspecified severe protein-calorie malnutrition: Secondary | ICD-10-CM | POA: Diagnosis not present

## 2021-06-25 DIAGNOSIS — R634 Abnormal weight loss: Secondary | ICD-10-CM | POA: Diagnosis not present

## 2021-06-25 DIAGNOSIS — F01B0 Vascular dementia, moderate, without behavioral disturbance, psychotic disturbance, mood disturbance, and anxiety: Secondary | ICD-10-CM

## 2021-06-25 DIAGNOSIS — I700 Atherosclerosis of aorta: Secondary | ICD-10-CM | POA: Diagnosis not present

## 2021-06-25 DIAGNOSIS — G930 Cerebral cysts: Secondary | ICD-10-CM | POA: Diagnosis not present

## 2021-06-25 DIAGNOSIS — F0150 Vascular dementia without behavioral disturbance: Secondary | ICD-10-CM

## 2021-06-25 DIAGNOSIS — L98429 Non-pressure chronic ulcer of back with unspecified severity: Secondary | ICD-10-CM

## 2021-06-25 DIAGNOSIS — I10 Essential (primary) hypertension: Secondary | ICD-10-CM

## 2021-06-25 NOTE — Assessment & Plan Note (Addendum)
MRA report discussed. Aneurysm remains stable at 4 cm. Mild pleural effusion - on Lasix. Repeat in 1 year.

## 2021-06-25 NOTE — Assessment & Plan Note (Signed)
Healed well. F/U as needed.

## 2021-06-25 NOTE — Progress Notes (Addendum)
SUBJECTIVE:   CHIEF COMPLAINT / HPI:   HTN: She did not take any medication today. She however started her hydralazine as instructed during her visit. No new concerns.  Weight loss:She is doing well now with her diet. No concerns.  Memory loss: No change since her last visit. Here for MRI f/u and to discuss starting medication for this.   Aortic aneurysm: Here to f/u with her MRI result. No concerns.  Gluteal ulcer: She feels this is improving, although she has not seen it lately. No new concern.  PERTINENT  PMH / PSH: PMX reviewed  OBJECTIVE:   BP (!) 164/74   Pulse 68   Ht 5\' 4"  (1.626 m)   Wt 113 lb 3.2 oz (51.3 kg)   SpO2 99%   BMI 19.43 kg/m   Physical Exam Vitals and nursing note reviewed. Chaperone present: Husband was with her throughout this visit.  Cardiovascular:     Rate and Rhythm: Normal rate and regular rhythm.     Heart sounds: Normal heart sounds. No murmur heard. Pulmonary:     Effort: Pulmonary effort is normal. No respiratory distress.     Breath sounds: Normal breath sounds. No wheezing.  Abdominal:     General: Abdomen is flat. Bowel sounds are normal. There is no distension.     Palpations: Abdomen is soft. There is no mass.     Tenderness: There is no abdominal tenderness.  Musculoskeletal:     Right lower leg: No edema.     Left lower leg: No edema.  Skin:    Comments: The ulcer on her tailbone healed well with a dark and dry scab.   MR Brain Wo Contrast IMPRESSION: Motion degraded examination, as described. No evidence of acute intracranial abnormality. Mild-to-moderate chronic small vessel ischemic changes within the cerebral white matter. Chronic lacunar infarct within the right thalamus. Probable small incidental arachnoid cyst overlying the posterior right frontal lobe. Mild generalized parenchymal atrophy. Electronically Signed   By: Kellie Simmering D.O.   On: 06/24/2021 16:43   MR ANGIO CHEST WO CONTRAST IMPRESSION: 1. Stable mild  fusiform aneurysmal dilation of the ascending thoracic aorta the maximal diameter of 4.0 cm. Recommend annual imaging followup by CTA or MRA. This recommendation follows 2010 ACCF/AHA/AATS/ACR/ASA/SCA/SCAI/SIR/STS/SVM Guidelines for the Diagnosis and Management of Patients with Thoracic Aortic Disease. Circulation. 2010; 121: B017-P102. Aortic aneurysm NOS (ICD10-I71.9) 2. Cardiomegaly. 3. Concentric hypertrophy of the left ventricular myocardium. 4. Small to moderate layering right pleural effusion. Signed, Criselda Peaches, MD, [facility] Electronically Signed   By: Jacqulynn Cadet M.D.   On: 06/24/2021 15:27     ASSESSMENT/PLAN:   Essential hypertension BP elevated during this visit. However, she did not take her medication.  Continue Norvasc 10 mg qd, Lasix 20 mg qd, Hydralazine 10 mg TID and Imdue 30 mg qd. Continue home BP monitoring.  F/U in 8 weeks.  Weight loss: Her weight improved from 107 from last visit 2 weeks ago to 113lbs today. I encouraged intake of Ensure TID with meals which she is yet to start. We will continue to monitor this closely.  Vascular dementia [facility]) MRI of her brain reviewed and discussed with her. MRI - multiple vascular ischemia with old stoke likely contributing to her memory loss. Continue Anticoagulant and Statins. Holding off on Aricept/Namendia due to her soft HR as this may make it worse. Consider one of this agents in the future if symptoms progresses some.  Her husband will let me know. Incidental arachnoid cyst. As discussed with her, this may resolve or program. Will refer to neurosurgery for monitoring and if needed surgical management. She and her husband agreed with the plan.  Aortic atherosclerosis ([place]) MRA report discussed. Aneurysm remains stable at 4 cm. Mild pleural effusion - on Lasix. Repeat in 1 year.  Sacral ulcer ([place]) Healed well. F/U as needed.     Andrena Mews, MD [place]

## 2021-06-25 NOTE — Assessment & Plan Note (Signed)
MRI of her brain reviewed and discussed with her. MRI - multiple vascular ischemia with old stoke likely contributing to her memory loss. Continue Anticoagulant and Statins. Holding off on Aricept/Namendia due to her soft HR as this may make it worse. Consider one of this agents in the future if symptoms progresses some. Her husband will let me know. Incidental arachnoid cyst. As discussed with her, this may resolve or program. Will refer to neurosurgery for monitoring and if needed surgical management. She and her husband agreed with the plan.

## 2021-06-25 NOTE — Patient Instructions (Signed)
Arachnoid Cyst An arachnoid cyst is a fluid-filled sac that forms in the brain, and in rare cases, on the spinal cord. The cyst develops between the brain and the membrane that covers the brain and spinal cord (arachnoid layer). The cyst is filled with fluid that surrounds and protects the brain and spinal cord (cerebrospinal fluid or CSF). Arachnoid cysts usually develop in a middle area of the brain (middle or temporal cranial fossa). In most cases, an arachnoid cyst develops before birth (primary arachnoid cyst). An arachnoid cyst can also develop after birth (secondary arachnoid cyst). This is less common. This type of cyst may be present at birth or it may develop after a brain injury or infection.A craniotomy or other procedure may be needed if an arachnoid cyst grows large enough to cause symptoms, such as headache, dizziness, nausea, vomiting, involuntary movements, cognitive impairment, or difficulty with balance and walking. The exact procedure depends on the size and location of the cyst. What are the causes? The exact cause of a primary arachnoid cyst is usually not known. It may be due to abnormalities of the brain and spinal cord during early fetal development. Secondary arachnoid cysts may be caused by: A brain or spinal cord injury. Bleeding in the brain or spinal cord. A brain or spinal cord infection (meningitis). A brain or spinal cord tumor. Brain or spinal cord surgery. Arachnoid cysts can also occur because of inherited disorders, such as Marfan syndrome, neurofibromatosis, autosomal dominant polycystic kidney disease, and tuberous sclerosis. What increases the risk? You may be more likely to have a primary arachnoid cyst if you have a family history of arachnoid cysts. What are the signs or symptoms? Signs and symptoms depend on the size and location of the cyst. Symptoms of a brain cyst may include: Headache. Nausea and vomiting. Seizures. Hearing and visual  disturbances. Dizziness. Difficulties with balance and walking. Symptoms of a spinal cord cyst may include: Pain in the back, arms, or legs that gets worse over time. Tingling, numbness, or weakness in the back, arms, or legs. Not being able to control urine or bowel movements. How is this diagnosed? This condition may be diagnosed based on: Your symptoms and medical history. A physical exam. Imaging tests, such as a CT scan or an MRI. This is the best way to diagnose an arachnoid cyst. How is this treated? Treatment for this condition depends on the size and location of the cyst and your symptoms. If the cyst is small and does not cause symptoms, you may only need regular visits with your health care provider to monitor your cyst over time. If your cyst is large, is in a dangerous area, or causes symptoms, you may need surgery to do one of the following: Open and drain the cyst. Place a tube (shunt) into the cyst to drain it. This is most likely if you have an abnormal accumulation of CSF within cavities of the brain (hydrocephalus). Remove the cyst completely. Follow these instructions at home: If your cyst is being monitored, watch for any new symptoms or changes in your condition. Let your health care provider know about any changes. Take over-the-counter and prescription medicines only as told by your health care provider. Return to your normal activities as told by your health care provider. Ask your health care provider what activities are safe for you. Keep all follow-up visits. This is important. Contact a health care provider if: Your symptoms get worse, or you develop any of the following: Pain or  a headache. Nausea or vomiting. Dizziness or clumsiness. Tingling or numbness. Changes in hearing or vision. Get help right away if you: Have a seizure. Suddenly lose hearing or vision. Lose control of your bowel or bladder function. Develop a severe headache. These symptoms  may represent a serious problem that is an emergency. Do not wait to see if the symptoms will go away. Get medical help right away. Call your local emergency services (911 in the [place] Do not drive yourself to the hospital. Summary An arachnoid cyst is a sac filled with fluid that surrounds and protects the brain and spinal cord (cerebrospinal fluid or CSF) that develops in your brain, and in rare cases, on your spinal cord. A primary cyst is present at birth. A secondary cyst develops after birth and may be caused by an injury, an infection, a tumor, or surgery. Treatment depends on the size and location of the cyst. Small cysts that do not cause symptoms may only need to be monitored. Large cysts that cause symptoms may require surgery. If your cyst is being monitored, watch for any new symptoms or changes in your condition. Let your health care provider know about any changes. This information is not intended to replace advice given to you by your health care provider. Make sure you discuss any questions you have with your health care provider. Document Revised: 01/06/2020 Document Reviewed: 01/06/2020 Elsevier Patient Education  [place].

## 2021-06-25 NOTE — Assessment & Plan Note (Signed)
BP elevated during this visit. However, she did not take her medication.  Continue Norvasc 10 mg qd, Lasix 20 mg qd, Hydralazine 10 mg TID and Imdue 30 mg qd. Continue home BP monitoring.

## 2021-06-26 DIAGNOSIS — E43 Unspecified severe protein-calorie malnutrition: Secondary | ICD-10-CM | POA: Insufficient documentation

## 2021-06-26 NOTE — Assessment & Plan Note (Signed)
Her weight improved from 107 from last visit 2 weeks ago to 113lbs today. I encouraged intake of Ensure TID with meals which she is yet to start. We will continue to monitor this closely.

## 2021-06-26 NOTE — Assessment & Plan Note (Signed)
As evidenced by loss of muscle mass Her weight improved from 107 from last visit 2 weeks ago to 113lbs today. I encouraged intake of Ensure TID with meals which she is yet to start. We will continue to monitor this closely.

## 2021-06-28 NOTE — Chronic Care Management (AMB) (Signed)
  Care Management   Note  06/28/2021 Name: Jennifer Foster MRN: [id number] DOB: 11/28/1945  [name] is a 75 y.o. year old female who is a primary care patient of [name], MD and is actively engaged with the care management team. I reached out to [name] by phone today to assist with re-scheduling a follow up visit with the RN Case Manager  Follow up plan: Unsuccessful telephone outreach attempt made. A HIPAA compliant phone message was left for the patient providing contact information and requesting a return call.  The care management team will reach out to the patient again over the next 7 days.  If patient returns call to provider office, please advise to call [place] at [phone].  [place] Management  Direct Dial: [phone]

## 2021-07-01 ENCOUNTER — Encounter: Payer: Self-pay | Admitting: Cardiovascular Disease

## 2021-07-01 NOTE — Progress Notes (Signed)
Cardiology Office Note:    Date:  07/02/2021   ID:  Jennifer Foster, DOB [birth year], MRN [id number]  PCP:  [name], MD   [facility] Providers Cardiologist:  Mertie Moores, MD     Referring MD: [name], MD   Chief Complaint  Patient presents with   Congestive Heart Failure          Nov. 1, 2022   Jennifer Foster is a 75 y.o. female with a hx of  anemia, atrial fib, chronic diastoic CHF, HLD, HTN,   She is a previous patient of Dr. Saunders Revel Has been seen by Dr. Percival Spanish recently in April 2021 This is the first time I am meeting her   She complains of some chest pain today - has this periodically ,  thinks it is from something she ate Very unfamiliar with her medical conditions and her medications  Did not know about afib  She has a history of coronary artery disease with a heart catheterization in April, 2019.  She has mild to moderate coronary artery disease and has been treated medically.  Echocardiogram in February, 2022 reveals LVEF of 50 to 55%.  She has severe left ventricular hypertrophy with grade 1 diastolic dysfunction.  She continues to smoke less than a pack a day.  Walks on occasion   She has not taken her medications yet  - her BP is elevated.  She eats a very salty diet - eats lots of processed meats, hot dogs, soup    Past Medical History:  Diagnosis Date   Acute on chronic blood loss anemia 06/26/2015   Acute respiratory failure with hypoxia ([place]) 02/21/2018   Aortic atherosclerosis ([place]) 01/01/2018   CT 04/2017   Asthma    Atrial fibrillation ([place]) 10/13/2017   Newly diagnosed in Jan 2019 // Apixaban for anticoag // Apixaban held in 2019 for anemia but resumed; Hgb stable since restarting   CAD (coronary artery disease) 11/27/2017   Nuc stress 3/19: anterior ischemia, ?inf scar, EF 50, intermediate risk // LHC 4/19: LAD mild dz, pLCx 35, OM2 30, mRCA 60, dRCA 65, LVEDP 15-20   Chronic diastolic CHF (congestive heart failure) ([place])  03/23/2018   Echo 2/19: Moderate LVH, EF 24-40, grade 2 diastolic dysfunction, trivial MR, moderate to severe LAE, PASP 30//Echocardiogram 7/21: EF 55-60, severe LVH, severe LAE, mod RAE, mild MR, mild dilation of Asc aorta (40 mm), RVSP 27.6    Chronic kidney disease (CKD), stage III (moderate) ([place]) 06/27/2015   DIVERTICULAR BLEEDING, HX OF 10/14/2007   Colonoscopy 2008 showed diverticulosis.    GERD (gastroesophageal reflux disease)    Gout    Hyperlipemia    Hypertension    Hypertensive urgency 10/25/2020   Proteinuria 01/11/2016   Thoracic aortic aneurysm 04/03/2017   CT 8/18: ascending thoracic aorta 4.1 cm // unable to do CTA due to CKD // Chest MRA 05/2019: Ascending thoracic aorta 40 mm    Past Surgical History:  Procedure Laterality Date   ABDOMINAL HYSTERECTOMY  1981   partial, per pt history   BIOPSY  09/05/2019   Procedure: BIOPSY;  Surgeon: Ronnette Juniper, MD;  Location: [facility] ENDOSCOPY;  Service: Gastroenterology;;   COLONOSCOPY N/A 12/08/2013   Procedure: COLONOSCOPY;  Surgeon: [name], MD;  Location: [facility];  Service: Endoscopy;  Laterality: N/A;   COLONOSCOPY N/A 06/28/2015   Procedure: COLONOSCOPY;  Surgeon: Clarene Essex, MD;  Location: [facility] ENDOSCOPY;  Service: Endoscopy;  Laterality: N/A;   ESOPHAGOGASTRODUODENOSCOPY N/A  12/08/2013   Procedure: ESOPHAGOGASTRODUODENOSCOPY (EGD);  Surgeon: [name], MD;  Location: [facility] ENDOSCOPY;  Service: Endoscopy;  Laterality: N/A;   ESOPHAGOGASTRODUODENOSCOPY (EGD) WITH PROPOFOL N/A 09/05/2019   Procedure: ESOPHAGOGASTRODUODENOSCOPY (EGD) WITH PROPOFOL;  Surgeon: Ronnette Juniper, MD;  Location: [facility] ENDOSCOPY;  Service: Gastroenterology;  Laterality: N/A;   GIVENS CAPSULE STUDY N/A 12/08/2013   Procedure: GIVENS CAPSULE STUDY;  Surgeon: [name], MD;  Location: [facility];  Service: Endoscopy;  Laterality: N/A;   LEFT HEART CATH AND CORONARY ANGIOGRAPHY N/A 12/03/2017   Procedure: LEFT HEART CATH AND CORONARY ANGIOGRAPHY;  Surgeon:  Nelva Bush, MD;  Location: [facility] CV LAB;  Service: Cardiovascular;  Laterality: N/A;   LIPOMA EXCISION  01/28/11   neck    Current Medications: Current Meds  Medication Sig   albuterol (VENTOLIN HFA) 108 (90 Base) MCG/ACT inhaler Inhale 2 puffs into the lungs every 6 (six) hours as needed for wheezing or shortness of breath.   amLODipine (NORVASC) 10 MG tablet TAKE 1 TABLET(10 MG) BY MOUTH DAILY   apixaban (ELIQUIS) 5 MG TABS tablet Take 1 tablet (5 mg total) by mouth 2 (two) times daily.   calcitRIOL (ROCALTROL) 0.25 MCG capsule Take 0.25 mcg by mouth daily.    famotidine (PEPCID) 20 MG tablet TAKE 1 TABLET(20 MG) BY MOUTH DAILY AS NEEDED FOR HEARTBURN OR INDIGESTION   furosemide (LASIX) 20 MG tablet TAKE 1 TABLET(20 MG) BY MOUTH DAILY   hydrALAZINE (APRESOLINE) 10 MG tablet Take 1 tablet (10 mg total) by mouth 3 (three) times daily.   isosorbide mononitrate (IMDUR) 30 MG 24 hr tablet Take 1 tablet (30 mg total) by mouth daily. TAKE 1 TABLET(30 MG) BY MOUTH DAILY. Patient needs appointment for any future refills. Please call office at [phone] to schedule appointment. 1st attempt.   mirtazapine (REMERON) 30 MG tablet Take 1 tablet (30 mg total) by mouth at bedtime.   nitroGLYCERIN (NITROSTAT) 0.4 MG SL tablet Place 1 tablet (0.4 mg total) under the tongue every 5 (five) minutes as needed for chest pain. If do not resolve after 1 tablet, go to ED   potassium chloride SA (KLOR-CON) 20 MEQ tablet TAKE 1/2 TABLET(10 MEQ) BY MOUTH DAILY   rosuvastatin (CRESTOR) 20 MG tablet TAKE 1 TABLET(20 MG) BY MOUTH AT BEDTIME     Allergies:   Ace inhibitors, Lisinopril, and Tramadol   Social History   Socioeconomic History   Marital status: Married    Spouse name: [name]   Number of children: Not on file   Years of education: 11   Highest education level: 11th grade  Occupational History   Occupation: unemployed  Tobacco Use   Smoking status: Some Days    Packs/day: 0.25    Types:  Cigarettes   Smokeless tobacco: Never  Vaping Use   Vaping Use: Never used  Substance and Sexual Activity   Alcohol use: Yes    Alcohol/week: 2.0 standard drinks    Types: 2 Standard drinks or equivalent per week    Comment: 1-2 beers    Drug use: No   Sexual activity: Yes  Other Topics Concern   Not on file  Social History Narrative   Lives with husband Doreen Garretson who is also an [place] patient.     History of Marijuana and crack use.  Had cocaine + in May 2012.   Social Determinants of Health   Financial Resource Strain: Low Risk    Difficulty of Paying Living Expenses: Not hard at all  Food Insecurity: No Food Insecurity   Worried About Charity fundraiser in the Last Year: Never true   Arboriculturist in the Last Year: Never true  Transportation Needs: Public librarian (Medical): Yes   Lack of Transportation (Non-Medical): Yes  Physical Activity: Insufficiently Active   Days of Exercise per Week: 2 days   Minutes of Exercise per Session: 30 min  Stress: Stress Concern Present   Feeling of Stress : Rather much  Social Connections: Moderately Integrated   Frequency of Communication with Friends and Family: More than three times a week   Frequency of Social Gatherings with Friends and Family: More than three times a week   Attends Religious Services: More than 4 times per year   Active Member of [employer] or Organizations: No   Attends Archivist Meetings: Never   Marital Status: Married     Family History: The patient's family history includes Diabetes type II in an other family member; Kidney failure in her son and another family member.  ROS:   Please see the history of present illness.     All other systems reviewed and are negative.  EKGs/Labs/Other Studies Reviewed:    The following studies were reviewed today:   EKG: July 02, 2021: Atrial fibrillation with a heart rate of 70.  T wave inversions in the inferior and  lateral leads.  No significant changes.  Recent Labs: 10/26/2020: Magnesium 2.1 02/18/2021: ALT 9; TSH 0.439 03/19/2021: BUN 35; Creatinine, Ser 4.81; Hemoglobin 10.1; Platelets 200; Potassium 4.0; Sodium 135  Recent Lipid Panel    Component Value Date/Time   CHOL 151 09/28/2020 0917   TRIG 52 09/28/2020 0917   HDL 94 09/28/2020 0917   CHOLHDL 1.6 09/28/2020 0917   CHOLHDL 2.3 03/18/2016 1120   VLDL 32 (H) 03/18/2016 1120   LDLCALC 46 09/28/2020 0917   LDLDIRECT 64 05/31/2019 0957   LDLDIRECT 88 07/16/2011 1138     Risk Assessment/Calculations:    CHA2DS2-VASc Score = 5   This indicates a 7.2% annual risk of stroke. The patient's score is based upon: CHF History: 1 HTN History: 1 Diabetes History: 0 Stroke History: 0 Vascular Disease History: 1 Age Score: 1 Gender Score: 1        Physical Exam:    VS:  BP (!) 150/72   Pulse 64   Ht 5\' 4"  (1.626 m)   Wt 112 lb 6.4 oz (51 kg)   SpO2 99%   BMI 19.29 kg/m     Wt Readings from Last 3 Encounters:  07/02/21 112 lb 6.4 oz (51 kg)  06/25/21 113 lb 3.2 oz (51.3 kg)  06/11/21 107 lb 12.8 oz (48.9 kg)     GEN: elderly , frail female,  appears chronically ill  HEENT: Normal NECK: No JVD; No carotid bruits LYMPHATICS: No lymphadenopathy CARDIAC: irreg. Irreg.  RESPIRATORY:  Clear to auscultation without rales, wheezing or rhonchi  ABDOMEN: Soft, non-tender, non-distended MUSCULOSKELETAL:  No edema; No deformity  SKIN: Warm and dry NEUROLOGIC:  Alert and oriented x 3 PSYCHIATRIC:  Normal affect   ASSESSMENT:    1. Chronic diastolic CHF (congestive heart failure) ([place])   2. Essential hypertension   3. Paroxysmal atrial fibrillation (HCC)   4. Chest pain of uncertain etiology    PLAN:      Paroxysmal atrial fibrillation: She is in atrial fibrillation today.  I verified that she does take her Eliquis although she admits that  she has not taken her medications today yet.  She has known paroxysmal A. fib.  No  changes in therapy at this time.  2.  Hypertension: Blood pressure is elevated today although she has not taken her medications.  She eats a very high salt diet including hotdogs, soup, processed meats most days of the week.  3.  Coronary artery disease: She has mild to moderate coronary artery disease.  She is not having any angina.       Medication Adjustments/Labs and Tests Ordered: Current medicines are reviewed at length with the patient today.  Concerns regarding medicines are outlined above.  Orders Placed This Encounter  Procedures   EKG 12-Lead   No orders of the defined types were placed in this encounter.   Patient Instructions  Medication Instructions:  Your physician recommends that you continue on your current medications as directed. Please refer to the Current Medication list given to you today.  *If you need a refill on your cardiac medications before your next appointment, please call your pharmacy*   Lab Work: None ordered  If you have labs (blood work) drawn today and your tests are completely normal, you will receive your results only by: [place] (if you have MyChart) OR A paper copy in the mail If you have any lab test that is abnormal or we need to change your treatment, we will call you to review the results.   Testing/Procedures: None ordered   Follow-Up: At [facility], you and your health needs are our priority.  As part of our continuing mission to provide you with exceptional heart care, we have created designated Provider Care Teams.  These Care Teams include your primary Cardiologist (physician) and Advanced Practice Providers (APPs -  Physician Assistants and Nurse Practitioners) who all work together to provide you with the care you need, when you need it.  We recommend signing up for the patient portal called "MyChart".  Sign up information is provided on this After Visit Summary.  MyChart is used to connect with patients for  Virtual Visits (Telemedicine).  Patients are able to view lab/test results, encounter notes, upcoming appointments, etc.  Non-urgent messages can be sent to your provider as well.   To learn more about what you can do with MyChart, go to [email or web address].    Your next appointment:   6 month(s)  The format for your next appointment:   In Person  Provider:   Richardson Dopp, PA-C   Other Instructions    Signed, Mertie Moores, MD  07/02/2021 1:43 PM    [place]

## 2021-07-02 ENCOUNTER — Ambulatory Visit (INDEPENDENT_AMBULATORY_CARE_PROVIDER_SITE_OTHER): Payer: Medicare HMO | Admitting: Cardiovascular Disease

## 2021-07-02 ENCOUNTER — Encounter: Payer: Self-pay | Admitting: Cardiovascular Disease

## 2021-07-02 ENCOUNTER — Other Ambulatory Visit: Payer: Self-pay

## 2021-07-02 VITALS — BP 150/72 | HR 64 | Ht 64.0 in | Wt 112.4 lb

## 2021-07-02 DIAGNOSIS — R079 Chest pain, unspecified: Secondary | ICD-10-CM | POA: Diagnosis not present

## 2021-07-02 DIAGNOSIS — I480 Paroxysmal atrial fibrillation: Secondary | ICD-10-CM | POA: Diagnosis not present

## 2021-07-02 DIAGNOSIS — I10 Essential (primary) hypertension: Secondary | ICD-10-CM

## 2021-07-02 DIAGNOSIS — I5032 Chronic diastolic (congestive) heart failure: Secondary | ICD-10-CM | POA: Diagnosis not present

## 2021-07-02 DIAGNOSIS — I2510 Atherosclerotic heart disease of native coronary artery without angina pectoris: Secondary | ICD-10-CM

## 2021-07-02 NOTE — Patient Instructions (Signed)
Medication Instructions:  Your physician recommends that you continue on your current medications as directed. Please refer to the Current Medication list given to you today.  *If you need a refill on your cardiac medications before your next appointment, please call your pharmacy*   Lab Work: None ordered  If you have labs (blood work) drawn today and your tests are completely normal, you will receive your results only by: . MyChart Message (if you have MyChart) OR . A paper copy in the mail If you have any lab test that is abnormal or we need to change your treatment, we will call you to review the results.   Testing/Procedures: None ordered   Follow-Up: At [facility], you and your health needs are our priority.  As part of our continuing mission to provide you with exceptional heart care, we have created designated Provider Care Teams.  These Care Teams include your primary Cardiologist (physician) and Advanced Practice Providers (APPs -  Physician Assistants and Nurse Practitioners) who all work together to provide you with the care you need, when you need it.  We recommend signing up for the patient portal called "MyChart".  Sign up information is provided on this After Visit Summary.  MyChart is used to connect with patients for Virtual Visits (Telemedicine).  Patients are able to view lab/test results, encounter notes, upcoming appointments, etc.  Non-urgent messages can be sent to your provider as well.   To learn more about what you can do with MyChart, go to [email or web address].    Your next appointment:   6 month(s)  The format for your next appointment:   In Person  Provider:   Scott Weaver, PA-C   Other Instructions   

## 2021-07-05 NOTE — Chronic Care Management (AMB) (Signed)
  Care Management   Note  07/05/2021 Name: Jennifer Foster MRN: [id number] DOB: Mar 17, 1946  Jennifer Foster is a 75 y.o. year old female who is a primary care patient of [name], MD and is actively engaged with the care management team. I reached out to [name] by phone today to assist with re-scheduling a follow up visit with the RN Case Manager  Follow up plan: Telephone appointment with care management team member scheduled for:07/15/21  [place] Management  Direct Dial: [phone]

## 2021-07-08 ENCOUNTER — Other Ambulatory Visit: Payer: Self-pay

## 2021-07-08 MED ORDER — ISOSORBIDE MONONITRATE ER 30 MG PO TB24: 30.0000 mg | ORAL_TABLET | Freq: Every day | ORAL | 3 refills | Status: AC

## 2021-07-10 ENCOUNTER — Other Ambulatory Visit: Payer: Self-pay | Admitting: Family Medicine

## 2021-07-11 ENCOUNTER — Other Ambulatory Visit: Payer: Self-pay | Admitting: Physician Assistant

## 2021-07-13 ENCOUNTER — Emergency Department (HOSPITAL_COMMUNITY)
Admission: EM | Admit: 2021-07-13 | Discharge: 2021-08-01 | Disposition: E | Payer: Medicare HMO | Attending: Emergency Medicine | Admitting: Emergency Medicine

## 2021-07-13 DIAGNOSIS — I5032 Chronic diastolic (congestive) heart failure: Secondary | ICD-10-CM | POA: Insufficient documentation

## 2021-07-13 DIAGNOSIS — Z79899 Other long term (current) drug therapy: Secondary | ICD-10-CM | POA: Diagnosis not present

## 2021-07-13 DIAGNOSIS — Z85858 Personal history of malignant neoplasm of other endocrine glands: Secondary | ICD-10-CM | POA: Diagnosis not present

## 2021-07-13 DIAGNOSIS — F17210 Nicotine dependence, cigarettes, uncomplicated: Secondary | ICD-10-CM | POA: Diagnosis not present

## 2021-07-13 DIAGNOSIS — I2510 Atherosclerotic heart disease of native coronary artery without angina pectoris: Secondary | ICD-10-CM | POA: Diagnosis not present

## 2021-07-13 DIAGNOSIS — Z7901 Long term (current) use of anticoagulants: Secondary | ICD-10-CM | POA: Diagnosis not present

## 2021-07-13 DIAGNOSIS — R4020 Unspecified coma: Secondary | ICD-10-CM | POA: Diagnosis not present

## 2021-07-13 DIAGNOSIS — N184 Chronic kidney disease, stage 4 (severe): Secondary | ICD-10-CM | POA: Diagnosis not present

## 2021-07-13 DIAGNOSIS — F0390 Unspecified dementia without behavioral disturbance: Secondary | ICD-10-CM | POA: Diagnosis not present

## 2021-07-13 DIAGNOSIS — I469 Cardiac arrest, cause unspecified: Secondary | ICD-10-CM | POA: Diagnosis not present

## 2021-07-13 DIAGNOSIS — R404 Transient alteration of awareness: Secondary | ICD-10-CM | POA: Diagnosis not present

## 2021-07-13 DIAGNOSIS — I4891 Unspecified atrial fibrillation: Secondary | ICD-10-CM | POA: Insufficient documentation

## 2021-07-13 DIAGNOSIS — I499 Cardiac arrhythmia, unspecified: Secondary | ICD-10-CM | POA: Diagnosis not present

## 2021-07-13 DIAGNOSIS — I130 Hypertensive heart and chronic kidney disease with heart failure and stage 1 through stage 4 chronic kidney disease, or unspecified chronic kidney disease: Secondary | ICD-10-CM | POA: Diagnosis not present

## 2021-07-13 DIAGNOSIS — J45909 Unspecified asthma, uncomplicated: Secondary | ICD-10-CM | POA: Insufficient documentation

## 2021-07-13 DIAGNOSIS — R0689 Other abnormalities of breathing: Secondary | ICD-10-CM | POA: Diagnosis not present

## 2021-07-13 MED ORDER — CALCIUM CHLORIDE 10 % IV SOLN
INTRAVENOUS | Status: DC | PRN
  Administered 2021-07-13: 1 g via INTRAVENOUS

## 2021-07-13 MED ORDER — DEXTROSE 50 % IV SOLN
INTRAVENOUS | Status: DC | PRN
  Administered 2021-07-13: 50 mL via INTRAVENOUS

## 2021-07-13 MED ORDER — EPINEPHRINE 1 MG/10ML IJ SOSY
PREFILLED_SYRINGE | INTRAMUSCULAR | Status: DC | PRN
Start: 2021-07-13 — End: 2021-07-13
  Administered 2021-07-13: 1 mg via INTRAVENOUS

## 2021-07-13 MED ORDER — SODIUM BICARBONATE 8.4 % IV SOLN
INTRAVENOUS | Status: DC | PRN
  Administered 2021-07-13: 50 meq via INTRAVENOUS

## 2021-07-14 ENCOUNTER — Telehealth: Payer: Self-pay | Admitting: Family Medicine

## 2021-07-14 NOTE — Telephone Encounter (Signed)
I called to check on Jennifer Foster after receiving the report of his wife's passing.  He is holding up fine. I discussed the connection with a grief counselor, and he agreed to reach out to me soon should he need to be connected with a grief counselor.

## 2021-07-15 ENCOUNTER — Telehealth: Payer: Medicare HMO

## 2021-08-01 NOTE — Progress Notes (Signed)
   Aug 11, 2021 1624  Clinical Encounter Type  Visited With Patient and family together  Visit Type Initial;Social support;Death;ED  Referral From Nurse  Consult/Referral To Chaplain   Pt's husband - Nyrie Sigal, [phone]  Funeral home: Most likely [facility] [address] responded to page for family support after Pt death. Chaplain was present while Dr. Langston Masker informed Pt's family. Pt's brother, sister, niece and nephew were initially present. Pt's husband and daughter arrived later. Chaplain facilitated Pt's family going back to see family. Chaplain engaged active listening and provided emotional support. Chaplain accompanied Pt's family until they left the hospital and notified Pt's nurse and charge nurse that Pt was ready to be taken to the morgue. Pt's husband was given Patient Placement card. Chaplain remains available.  This note was prepared by Marijo File, MDiv. Chaplain remains available as needed through the on-call pager: [phone].

## 2021-08-01 NOTE — ED Triage Notes (Signed)
Arrived CPR in progress, found unresponsive in the back of car. Initial rhythm asystole. CPR PTA x 50 min on flied. Approx 10 rounds of epi given.

## 2021-08-01 NOTE — ED Provider Notes (Signed)
[facility] EMERGENCY DEPARTMENT Provider Note   CSN: [id number] Arrival date & time: 08/03/21  1549     History Chief Complaint  Patient presents with   Cardiac Arrest    Jennifer Foster is a 75 y.o. female presented emergency department in cardiac arrest.  Supplemental history provided by the paramedics who arrived performed CPR.  They report that the patient was in her normal state of health per family's report, had become unresponsive in the backseat of a car.  They arrived within minutes on scene.  Patient's initial rhythm was asystole.  They began immediate chest compressions.  10 rounds of epinephrine were given.  The patient briefly had V. fib and was defibrillated.  They report there was a brief return of pulses and then she lost pulses again.  Total CPR time was 50 minutes.  A King airway was placed.  The patient received 2 rounds of amiodarone.  Her blood sugar was within normal limits in the field.  For the paramedics are significant medical history include cardiac arrhythmia and cardiac disease and TIA.  She is on Eliquis per her medical records.  Family includes her husband and others on scene, who were in route to the hospital.  Patient arrives unresponsive  HPI     Past Medical History:  Diagnosis Date   Acute on chronic blood loss anemia 06/26/2015   Acute respiratory failure with hypoxia ([place]) 02/21/2018   Aortic atherosclerosis ([place]) 01/01/2018   CT 04/2017   Asthma    Atrial fibrillation ([place]) 10/13/2017   Newly diagnosed in Jan 2019 // Apixaban for anticoag // Apixaban held in 2019 for anemia but resumed; Hgb stable since restarting   CAD (coronary artery disease) 11/27/2017   Nuc stress 3/19: anterior ischemia, ?inf scar, EF 50, intermediate risk // LHC 4/19: LAD mild dz, pLCx 35, OM2 30, mRCA 60, dRCA 65, LVEDP 15-20   Chronic diastolic CHF (congestive heart failure) ([place]) 03/23/2018   Echo 2/19: Moderate LVH, EF 95-09, grade 2 diastolic  dysfunction, trivial MR, moderate to severe LAE, PASP 30//Echocardiogram 7/21: EF 55-60, severe LVH, severe LAE, mod RAE, mild MR, mild dilation of Asc aorta (40 mm), RVSP 27.6    Chronic kidney disease (CKD), stage III (moderate) ([place]) 06/27/2015   DIVERTICULAR BLEEDING, HX OF 10/14/2007   Colonoscopy 2008 showed diverticulosis.    GERD (gastroesophageal reflux disease)    Gout    Hyperlipemia    Hypertension    Hypertensive urgency 10/25/2020   Proteinuria 01/11/2016   Thoracic aortic aneurysm 04/03/2017   CT 8/18: ascending thoracic aorta 4.1 cm // unable to do CTA due to CKD // Chest MRA 05/2019: Ascending thoracic aorta 40 mm    Patient Active Problem List   Diagnosis Date Noted   Severe protein-calorie malnutrition ([place]) 06/26/2021   Vascular dementia ([place]) 06/25/2021   Sacral ulcer ([place]) 06/11/2021   Fall 06/11/2021   Bradycardia 06/11/2021   Abnormal echocardiogram 11/02/2020   Splenic infarct    Sciatica of right side without back pain 07/12/2020   Acquired thrombophilia ([place]) 01/24/2020   History of cocaine abuse ([place]) 10/03/2019   Upper GI bleed 09/04/2019   Chronic diastolic CHF (congestive heart failure) ([place]) 03/23/2018   Aortic atherosclerosis ([place]) 01/01/2018   CAD (coronary artery disease) 11/27/2017   Atrial fibrillation ([place]) 10/13/2017   Multinodular goiter 04/14/2017   Adrenal adenoma 04/14/2017   Thoracic aortic aneurysm without rupture 04/03/2017   Weight loss 01/30/2017   Anxiety and depression 12/25/2015  CKD (chronic kidney disease) stage 4, GFR 15-29 ml/min (HCC) 06/27/2015   Essential hypertension    Gout 02/28/2014   Lipoma 03/19/2011   Hyperlipidemia 01/01/2011   Chronic anemia 11/23/2009   Tobacco abuse 09/03/2009    Past Surgical History:  Procedure Laterality Date   ABDOMINAL HYSTERECTOMY  1981   partial, per pt history   BIOPSY  09/05/2019   Procedure: BIOPSY;  Surgeon: Ronnette Juniper, MD;  Location: [facility];  Service:  Gastroenterology;;   COLONOSCOPY N/A 12/08/2013   Procedure: COLONOSCOPY;  Surgeon: [name], MD;  Location: [facility];  Service: Endoscopy;  Laterality: N/A;   COLONOSCOPY N/A 06/28/2015   Procedure: COLONOSCOPY;  Surgeon: Clarene Essex, MD;  Location: [facility] ENDOSCOPY;  Service: Endoscopy;  Laterality: N/A;   ESOPHAGOGASTRODUODENOSCOPY N/A 12/08/2013   Procedure: ESOPHAGOGASTRODUODENOSCOPY (EGD);  Surgeon: [name], MD;  Location: [facility] ENDOSCOPY;  Service: Endoscopy;  Laterality: N/A;   ESOPHAGOGASTRODUODENOSCOPY (EGD) WITH PROPOFOL N/A 09/05/2019   Procedure: ESOPHAGOGASTRODUODENOSCOPY (EGD) WITH PROPOFOL;  Surgeon: Ronnette Juniper, MD;  Location: [facility] ENDOSCOPY;  Service: Gastroenterology;  Laterality: N/A;   GIVENS CAPSULE STUDY N/A 12/08/2013   Procedure: GIVENS CAPSULE STUDY;  Surgeon: [name], MD;  Location: [facility];  Service: Endoscopy;  Laterality: N/A;   LEFT HEART CATH AND CORONARY ANGIOGRAPHY N/A 12/03/2017   Procedure: LEFT HEART CATH AND CORONARY ANGIOGRAPHY;  Surgeon: Nelva Bush, MD;  Location: [facility] LAB;  Service: Cardiovascular;  Laterality: N/A;   LIPOMA EXCISION  01/28/11   neck     OB History   No obstetric history on file.     Family History  Problem Relation Age of Onset   Diabetes type II Other    Kidney failure Other    Kidney failure Son     Social History   Tobacco Use   Smoking status: Some Days    Packs/day: 0.25    Types: Cigarettes   Smokeless tobacco: Never  Vaping Use   Vaping Use: Never used  Substance Use Topics   Alcohol use: Yes    Alcohol/week: 2.0 standard drinks    Types: 2 Standard drinks or equivalent per week    Comment: 1-2 beers    Drug use: No    Home Medications Prior to Admission medications   Medication Sig Start Date End Date Taking? Authorizing Provider  albuterol (VENTOLIN HFA) 108 (90 Base) MCG/ACT inhaler Inhale 2 puffs into the lungs every 6 (six) hours as needed for wheezing or shortness of  breath. 12/27/19   Regalado, Belkys A, MD  amLODipine (NORVASC) 10 MG tablet TAKE 1 TABLET(10 MG) BY MOUTH DAILY 12/06/20   [name], MD  apixaban (ELIQUIS) 5 MG TABS tablet Take 1 tablet (5 mg total) by mouth 2 (two) times daily. 02/18/21   Zenia Resides, MD  calcitRIOL (ROCALTROL) 0.25 MCG capsule Take 0.25 mcg by mouth daily.  12/24/18   [provider]  diclofenac Sodium (VOLTAREN) 1 % GEL Apply 4 g topically 4 (four) times daily. Apply to hip joint Patient not taking: Reported on 07/02/2021 04/17/20   [name], MD  famotidine (PEPCID) 20 MG tablet TAKE 1 TABLET(20 MG) BY MOUTH DAILY AS NEEDED FOR HEARTBURN OR INDIGESTION 05/14/21   [name], MD  furosemide (LASIX) 20 MG tablet TAKE 1 TABLET(20 MG) BY MOUTH DAILY 12/06/20   [name], MD  hydrALAZINE (APRESOLINE) 10 MG tablet TAKE 1 TABLET(10 MG) BY MOUTH THREE TIMES DAILY 07/10/21   [name],  Phill Myron, MD  isosorbide mononitrate (IMDUR) 30 MG 24 hr tablet Take 1 tablet (30 mg total) by mouth daily. TAKE 1 TABLET(30 MG) BY MOUTH DAILY. 07/08/21   Nahser, Wonda Cheng, MD  mirtazapine (REMERON) 30 MG tablet Take 1 tablet (30 mg total) by mouth at bedtime. 02/18/21   Zenia Resides, MD  nitroGLYCERIN (NITROSTAT) 0.4 MG SL tablet Place 1 tablet (0.4 mg total) under the tongue every 5 (five) minutes as needed for chest pain. If do not resolve after 1 tablet, go to ED 10/13/17   Smiley Houseman, MD  potassium chloride SA (KLOR-CON) 20 MEQ tablet TAKE 1/2 TABLET(10 MEQ) BY MOUTH DAILY 07/11/21   Nahser, Wonda Cheng, MD  rosuvastatin (CRESTOR) 20 MG tablet TAKE 1 TABLET(20 MG) BY MOUTH AT BEDTIME 01/23/21   [name], MD    Allergies    Ace inhibitors, Lisinopril, and Tramadol  Review of Systems   Review of Systems  Unable to perform ROS: Patient unresponsive (level 5 caveat)   Physical Exam Updated Vital Signs There were no vitals taken for this visit.  Physical Exam Constitutional:      Comments:  Unresponsive  Neck:     Comments: LMA in place Cardiovascular:     Comments: No pulses Pulmonary:     Comments: Bilateral breath sounds, bag ventilation Neurological:     Comments: Pupils fixed and dilated, no gag reflex    ED Results / Procedures / Treatments   Labs (all labs ordered are listed, but only abnormal results are displayed) Labs Reviewed - No data to display  EKG None  Radiology No results found.  Procedures .Critical Care Performed by: Wyvonnia Dusky, MD Authorized by: Wyvonnia Dusky, MD   Critical care provider statement:    Critical care time (minutes):  45   Critical care time was exclusive of:  Separately billable procedures and treating other patients   Critical care was necessary to treat or prevent imminent or life-threatening deterioration of the following conditions:  Circulatory failure   Critical care was time spent personally by me on the following activities:  Ordering and performing treatments and interventions, ordering and review of laboratory studies, ordering and review of radiographic studies, pulse oximetry, review of old charts, examination of patient and evaluation of patient's response to treatment   Care discussed with: admitting provider   Comments:     PEA cardiac arrest   Medications Ordered in ED Medications  EPINEPHrine (ADRENALIN) 1 MG/10ML injection (1 mg Intravenous Given July 27, 2021 1549)  calcium chloride injection (1 g Intravenous Given 27-Jul-2021 1548)  sodium bicarbonate injection (50 mEq Intravenous Given 27-Jul-2021 1548)  dextrose 50 % solution (50 mLs Intravenous Given 07-27-2021 1558)    ED Course  I have reviewed the triage vital signs and the nursing notes.  Pertinent labs & imaging results that were available during my care of the patient were reviewed by me and considered in my medical decision making (see chart for details).  Patient arrives in PEA arrest with approximately 50 to 60 minutes of CPR performed in  the field by EMS.  Her airway was secured with bilateral breath sounds.  Her Accu-Chek upon arrival was within normal limits.  Her immediate pulse check showed continued asystole.  The patient had received 10 rounds of epinephrine in the field.  She was given additional epinephrine here, as well as an amp of D50, calcium gluconate, and sodium bicarb.  Chest compressions were immediately continued.  Please see nursing  code documentation.   With the patient and continued asystole for nearly an hour, with no blink or gag reflex, and high clinical concern for anoxic brain death, the decision was made to pronounce patient deceased at 71.  Family members updated in person by myself in the hospital.  Case is not referred to [facility].   Final Clinical Impression(s) / ED Diagnoses Final diagnoses:  Cardiopulmonary arrest [facility])    Rx / DC Orders ED Discharge Orders     None        Wyvonnia Dusky, MD 07/26/21 2116

## 2021-08-01 DEATH — deceased

## 2021-08-02 ENCOUNTER — Telehealth: Payer: Self-pay | Admitting: Family Medicine

## 2021-08-02 NOTE — Telephone Encounter (Signed)
Production manager form dropped off at front desk for completion by Butch Penny from [employer].  Verified that patient section of form has been completed.  Last DOS/WCC with PCP was 06/25/21.  Placed form in team folder to be completed by clinical staff.  Jennifer Foster

## 2021-08-05 NOTE — Telephone Encounter (Signed)
Clinical info completed on [employer] form.  Place form in covering PCP's  Dr. McDiarmid box for completion.  Salvatore Marvel, CMA

## 2021-08-06 NOTE — Telephone Encounter (Signed)
Form has been scanned in to chart.   Will forward to [facility] for medical records.   A copy of form is in Medical Records bin and also at my desk in RN room.

## 2021-08-08 NOTE — Telephone Encounter (Signed)
Received form to release medical records. Printed records and placed with form at front desk. Called Butch Penny at [facility] [employer] to let her know this form is ready to be picked up.

## 2021-09-19 ENCOUNTER — Encounter: Payer: Self-pay | Admitting: Family Medicine

## 2021-09-19 NOTE — Progress Notes (Signed)
Medical Record request form handed over to [place] to process

## 2023-06-15 NOTE — Telephone Encounter (Signed)
error
